# Patient Record
Sex: Female | Born: 1940
Health system: Southern US, Community
[De-identification: ages and names within clinical notes are randomized; demographics above are authoritative.]

## PROBLEM LIST (undated history)

## (undated) DIAGNOSIS — M899 Disorder of bone, unspecified: Secondary | ICD-10-CM

## (undated) DIAGNOSIS — N904 Leukoplakia of vulva: Secondary | ICD-10-CM

## (undated) DIAGNOSIS — R21 Rash and other nonspecific skin eruption: Secondary | ICD-10-CM

## (undated) DIAGNOSIS — H268 Other specified cataract: Secondary | ICD-10-CM

## (undated) DIAGNOSIS — R7309 Other abnormal glucose: Secondary | ICD-10-CM

## (undated) DIAGNOSIS — R5383 Other fatigue: Secondary | ICD-10-CM

## (undated) DIAGNOSIS — I10 Essential (primary) hypertension: Secondary | ICD-10-CM

## (undated) DIAGNOSIS — B0059 Other herpesviral disease of eye: Secondary | ICD-10-CM

## (undated) DIAGNOSIS — J301 Allergic rhinitis due to pollen: Secondary | ICD-10-CM

## (undated) DIAGNOSIS — J45909 Unspecified asthma, uncomplicated: Secondary | ICD-10-CM

## (undated) DIAGNOSIS — B370 Candidal stomatitis: Secondary | ICD-10-CM

## (undated) DIAGNOSIS — M949 Disorder of cartilage, unspecified: Secondary | ICD-10-CM

## (undated) DIAGNOSIS — L989 Disorder of the skin and subcutaneous tissue, unspecified: Secondary | ICD-10-CM

## (undated) DIAGNOSIS — E119 Type 2 diabetes mellitus without complications: Secondary | ICD-10-CM

## (undated) DIAGNOSIS — D696 Thrombocytopenia, unspecified: Secondary | ICD-10-CM

## (undated) DIAGNOSIS — K2100 Gastro-esophageal reflux disease with esophagitis, without bleeding: Secondary | ICD-10-CM

## (undated) DIAGNOSIS — J069 Acute upper respiratory infection, unspecified: Secondary | ICD-10-CM

## (undated) DIAGNOSIS — E785 Hyperlipidemia, unspecified: Secondary | ICD-10-CM

## (undated) DIAGNOSIS — K5732 Diverticulitis of large intestine without perforation or abscess without bleeding: Secondary | ICD-10-CM

## (undated) DIAGNOSIS — R5381 Other malaise: Secondary | ICD-10-CM

## (undated) HISTORY — DX: Other specified cataract: H26.8

## (undated) HISTORY — PX: KNEE ARTHROSCOPY: SUR90

## (undated) HISTORY — DX: Other malaise: R53.83

## (undated) HISTORY — DX: Essential (primary) hypertension: I10

## (undated) HISTORY — DX: Gastro-esophageal reflux disease with esophagitis: K21.0

## (undated) HISTORY — DX: Hyperlipidemia, unspecified: E78.5

## (undated) HISTORY — DX: Type 2 diabetes mellitus without complications: E11.9

## (undated) HISTORY — DX: Diverticulitis of large intestine without perforation or abscess without bleeding: K57.32

## (undated) HISTORY — DX: Gastro-esophageal reflux disease with esophagitis, without bleeding: K21.00

## (undated) HISTORY — DX: Disorder of the skin and subcutaneous tissue, unspecified: L98.9

## (undated) HISTORY — DX: Other malaise: R53.81

## (undated) HISTORY — DX: Rash and other nonspecific skin eruption: R21

## (undated) HISTORY — DX: Disorder of bone, unspecified: M94.9

## (undated) HISTORY — DX: Other abnormal glucose: R73.09

## (undated) HISTORY — DX: Disorder of bone, unspecified: M89.9

## (undated) HISTORY — DX: Unspecified asthma, uncomplicated: J45.909

## (undated) HISTORY — DX: Leukoplakia of vulva: N90.4

## (undated) HISTORY — DX: Allergic rhinitis due to pollen: J30.1

## (undated) HISTORY — PX: ABDOMINAL HYSTERECTOMY: SHX81

## (undated) HISTORY — DX: Acute upper respiratory infection, unspecified: J06.9

## (undated) HISTORY — DX: Thrombocytopenia, unspecified: D69.6

## (undated) HISTORY — DX: Candidal stomatitis: B37.0

## (undated) HISTORY — DX: Other herpesviral disease of eye: B00.59

---

## 1972-12-13 HISTORY — PX: CYST EXCISION: SHX5701

## 2005-12-23 LAB — HM COLONOSCOPY

## 2009-09-09 ENCOUNTER — Encounter: Admission: RE | Admit: 2009-09-09 | Discharge: 2009-09-09 | Payer: Self-pay | Admitting: Internal Medicine

## 2009-09-09 LAB — HM MAMMOGRAPHY: HM Mammogram: NEGATIVE

## 2010-07-07 ENCOUNTER — Encounter: Admission: RE | Admit: 2010-07-07 | Discharge: 2010-07-07 | Payer: Self-pay | Admitting: Gastroenterology

## 2011-12-21 DIAGNOSIS — H3581 Retinal edema: Secondary | ICD-10-CM | POA: Diagnosis not present

## 2011-12-21 DIAGNOSIS — H2010 Chronic iridocyclitis, unspecified eye: Secondary | ICD-10-CM | POA: Diagnosis not present

## 2011-12-21 DIAGNOSIS — H04129 Dry eye syndrome of unspecified lacrimal gland: Secondary | ICD-10-CM | POA: Diagnosis not present

## 2011-12-21 DIAGNOSIS — Z961 Presence of intraocular lens: Secondary | ICD-10-CM | POA: Diagnosis not present

## 2012-02-09 DIAGNOSIS — B0050 Herpesviral ocular disease, unspecified: Secondary | ICD-10-CM | POA: Diagnosis not present

## 2012-02-09 DIAGNOSIS — H04129 Dry eye syndrome of unspecified lacrimal gland: Secondary | ICD-10-CM | POA: Diagnosis not present

## 2012-02-09 DIAGNOSIS — Z961 Presence of intraocular lens: Secondary | ICD-10-CM | POA: Diagnosis not present

## 2012-02-15 DIAGNOSIS — E119 Type 2 diabetes mellitus without complications: Secondary | ICD-10-CM | POA: Diagnosis not present

## 2012-02-15 DIAGNOSIS — E785 Hyperlipidemia, unspecified: Secondary | ICD-10-CM | POA: Diagnosis not present

## 2012-02-15 DIAGNOSIS — D696 Thrombocytopenia, unspecified: Secondary | ICD-10-CM | POA: Diagnosis not present

## 2012-02-15 DIAGNOSIS — L989 Disorder of the skin and subcutaneous tissue, unspecified: Secondary | ICD-10-CM | POA: Diagnosis not present

## 2012-02-17 ENCOUNTER — Other Ambulatory Visit: Payer: Self-pay | Admitting: Internal Medicine

## 2012-02-17 DIAGNOSIS — Z23 Encounter for immunization: Secondary | ICD-10-CM | POA: Diagnosis not present

## 2012-02-17 DIAGNOSIS — Z1231 Encounter for screening mammogram for malignant neoplasm of breast: Secondary | ICD-10-CM

## 2012-02-17 DIAGNOSIS — N904 Leukoplakia of vulva: Secondary | ICD-10-CM | POA: Diagnosis not present

## 2012-02-17 DIAGNOSIS — E119 Type 2 diabetes mellitus without complications: Secondary | ICD-10-CM | POA: Diagnosis not present

## 2012-02-17 DIAGNOSIS — Z780 Asymptomatic menopausal state: Secondary | ICD-10-CM

## 2012-02-17 DIAGNOSIS — I10 Essential (primary) hypertension: Secondary | ICD-10-CM | POA: Diagnosis not present

## 2012-03-03 ENCOUNTER — Ambulatory Visit
Admission: RE | Admit: 2012-03-03 | Discharge: 2012-03-03 | Disposition: A | Discharge status: Home / Self Care | Payer: Medicare Other | Source: Ambulatory Visit | Attending: Internal Medicine | Admitting: Internal Medicine

## 2012-03-03 DIAGNOSIS — Z780 Asymptomatic menopausal state: Secondary | ICD-10-CM

## 2012-03-03 DIAGNOSIS — Z1231 Encounter for screening mammogram for malignant neoplasm of breast: Secondary | ICD-10-CM | POA: Diagnosis not present

## 2012-03-03 DIAGNOSIS — Z13820 Encounter for screening for osteoporosis: Secondary | ICD-10-CM | POA: Diagnosis not present

## 2012-03-03 LAB — HM DEXA SCAN

## 2012-06-12 DIAGNOSIS — H04129 Dry eye syndrome of unspecified lacrimal gland: Secondary | ICD-10-CM | POA: Diagnosis not present

## 2012-06-12 DIAGNOSIS — B0050 Herpesviral ocular disease, unspecified: Secondary | ICD-10-CM | POA: Diagnosis not present

## 2012-06-12 DIAGNOSIS — Z961 Presence of intraocular lens: Secondary | ICD-10-CM | POA: Diagnosis not present

## 2012-06-12 DIAGNOSIS — H3581 Retinal edema: Secondary | ICD-10-CM | POA: Diagnosis not present

## 2012-08-11 DIAGNOSIS — E119 Type 2 diabetes mellitus without complications: Secondary | ICD-10-CM | POA: Diagnosis not present

## 2012-08-11 DIAGNOSIS — E785 Hyperlipidemia, unspecified: Secondary | ICD-10-CM | POA: Diagnosis not present

## 2012-08-16 DIAGNOSIS — E785 Hyperlipidemia, unspecified: Secondary | ICD-10-CM | POA: Diagnosis not present

## 2012-08-16 DIAGNOSIS — E119 Type 2 diabetes mellitus without complications: Secondary | ICD-10-CM | POA: Diagnosis not present

## 2012-08-16 DIAGNOSIS — I10 Essential (primary) hypertension: Secondary | ICD-10-CM | POA: Diagnosis not present

## 2012-09-13 DIAGNOSIS — Z961 Presence of intraocular lens: Secondary | ICD-10-CM | POA: Diagnosis not present

## 2012-09-13 DIAGNOSIS — B0052 Herpesviral keratitis: Secondary | ICD-10-CM | POA: Diagnosis not present

## 2012-09-13 DIAGNOSIS — H01009 Unspecified blepharitis unspecified eye, unspecified eyelid: Secondary | ICD-10-CM | POA: Diagnosis not present

## 2012-10-04 DIAGNOSIS — Z23 Encounter for immunization: Secondary | ICD-10-CM | POA: Diagnosis not present

## 2012-11-08 DIAGNOSIS — I10 Essential (primary) hypertension: Secondary | ICD-10-CM | POA: Diagnosis not present

## 2012-11-08 DIAGNOSIS — E119 Type 2 diabetes mellitus without complications: Secondary | ICD-10-CM | POA: Diagnosis not present

## 2012-11-08 DIAGNOSIS — E785 Hyperlipidemia, unspecified: Secondary | ICD-10-CM | POA: Diagnosis not present

## 2012-11-14 DIAGNOSIS — I10 Essential (primary) hypertension: Secondary | ICD-10-CM | POA: Diagnosis not present

## 2012-11-14 DIAGNOSIS — B0059 Other herpesviral disease of eye: Secondary | ICD-10-CM | POA: Diagnosis not present

## 2012-11-14 DIAGNOSIS — E119 Type 2 diabetes mellitus without complications: Secondary | ICD-10-CM | POA: Diagnosis not present

## 2012-12-20 DIAGNOSIS — H3530 Unspecified macular degeneration: Secondary | ICD-10-CM | POA: Diagnosis not present

## 2012-12-20 DIAGNOSIS — H35369 Drusen (degenerative) of macula, unspecified eye: Secondary | ICD-10-CM | POA: Diagnosis not present

## 2012-12-20 DIAGNOSIS — E119 Type 2 diabetes mellitus without complications: Secondary | ICD-10-CM | POA: Diagnosis not present

## 2012-12-20 DIAGNOSIS — B0052 Herpesviral keratitis: Secondary | ICD-10-CM | POA: Diagnosis not present

## 2012-12-22 DIAGNOSIS — H35359 Cystoid macular degeneration, unspecified eye: Secondary | ICD-10-CM | POA: Diagnosis not present

## 2012-12-22 DIAGNOSIS — H35369 Drusen (degenerative) of macula, unspecified eye: Secondary | ICD-10-CM | POA: Diagnosis not present

## 2012-12-22 DIAGNOSIS — H353190 Nonexudative age-related macular degeneration, unspecified eye, stage unspecified: Secondary | ICD-10-CM | POA: Diagnosis not present

## 2012-12-28 DIAGNOSIS — R141 Gas pain: Secondary | ICD-10-CM | POA: Diagnosis not present

## 2012-12-28 DIAGNOSIS — R143 Flatulence: Secondary | ICD-10-CM | POA: Diagnosis not present

## 2013-01-04 ENCOUNTER — Encounter: Payer: Self-pay | Admitting: Geriatric Medicine

## 2013-01-04 DIAGNOSIS — D571 Sickle-cell disease without crisis: Secondary | ICD-10-CM

## 2013-01-04 DIAGNOSIS — J45909 Unspecified asthma, uncomplicated: Secondary | ICD-10-CM | POA: Insufficient documentation

## 2013-01-04 DIAGNOSIS — R21 Rash and other nonspecific skin eruption: Secondary | ICD-10-CM

## 2013-01-04 DIAGNOSIS — N904 Leukoplakia of vulva: Secondary | ICD-10-CM

## 2013-01-04 DIAGNOSIS — E119 Type 2 diabetes mellitus without complications: Secondary | ICD-10-CM

## 2013-01-04 DIAGNOSIS — M899 Disorder of bone, unspecified: Secondary | ICD-10-CM

## 2013-01-04 DIAGNOSIS — L989 Disorder of the skin and subcutaneous tissue, unspecified: Secondary | ICD-10-CM | POA: Insufficient documentation

## 2013-01-04 DIAGNOSIS — K5732 Diverticulitis of large intestine without perforation or abscess without bleeding: Secondary | ICD-10-CM

## 2013-01-04 DIAGNOSIS — K2100 Gastro-esophageal reflux disease with esophagitis, without bleeding: Secondary | ICD-10-CM

## 2013-01-04 DIAGNOSIS — I10 Essential (primary) hypertension: Secondary | ICD-10-CM

## 2013-01-04 DIAGNOSIS — D696 Thrombocytopenia, unspecified: Secondary | ICD-10-CM

## 2013-01-04 DIAGNOSIS — J069 Acute upper respiratory infection, unspecified: Secondary | ICD-10-CM

## 2013-01-04 DIAGNOSIS — R5383 Other fatigue: Secondary | ICD-10-CM

## 2013-01-04 DIAGNOSIS — H268 Other specified cataract: Secondary | ICD-10-CM

## 2013-01-04 DIAGNOSIS — B0059 Other herpesviral disease of eye: Secondary | ICD-10-CM

## 2013-01-04 DIAGNOSIS — M949 Disorder of cartilage, unspecified: Secondary | ICD-10-CM

## 2013-01-04 DIAGNOSIS — E785 Hyperlipidemia, unspecified: Secondary | ICD-10-CM

## 2013-01-04 DIAGNOSIS — R7309 Other abnormal glucose: Secondary | ICD-10-CM

## 2013-01-04 DIAGNOSIS — B370 Candidal stomatitis: Secondary | ICD-10-CM

## 2013-01-04 DIAGNOSIS — J301 Allergic rhinitis due to pollen: Secondary | ICD-10-CM

## 2013-01-05 ENCOUNTER — Encounter: Payer: Self-pay | Admitting: Geriatric Medicine

## 2013-01-30 DIAGNOSIS — K219 Gastro-esophageal reflux disease without esophagitis: Secondary | ICD-10-CM | POA: Diagnosis not present

## 2013-01-30 DIAGNOSIS — R141 Gas pain: Secondary | ICD-10-CM | POA: Diagnosis not present

## 2013-01-30 DIAGNOSIS — R143 Flatulence: Secondary | ICD-10-CM | POA: Diagnosis not present

## 2013-02-22 DIAGNOSIS — H353190 Nonexudative age-related macular degeneration, unspecified eye, stage unspecified: Secondary | ICD-10-CM | POA: Diagnosis not present

## 2013-02-22 DIAGNOSIS — H43819 Vitreous degeneration, unspecified eye: Secondary | ICD-10-CM | POA: Diagnosis not present

## 2013-02-22 DIAGNOSIS — H35369 Drusen (degenerative) of macula, unspecified eye: Secondary | ICD-10-CM | POA: Diagnosis not present

## 2013-02-22 DIAGNOSIS — H43829 Vitreomacular adhesion, unspecified eye: Secondary | ICD-10-CM | POA: Diagnosis not present

## 2013-03-02 ENCOUNTER — Other Ambulatory Visit: Payer: Medicare Other

## 2013-03-02 DIAGNOSIS — E559 Vitamin D deficiency, unspecified: Secondary | ICD-10-CM | POA: Diagnosis not present

## 2013-03-02 DIAGNOSIS — IMO0001 Reserved for inherently not codable concepts without codable children: Secondary | ICD-10-CM | POA: Diagnosis not present

## 2013-03-02 DIAGNOSIS — D696 Thrombocytopenia, unspecified: Secondary | ICD-10-CM | POA: Diagnosis not present

## 2013-03-02 DIAGNOSIS — I10 Essential (primary) hypertension: Secondary | ICD-10-CM | POA: Diagnosis not present

## 2013-03-02 DIAGNOSIS — E785 Hyperlipidemia, unspecified: Secondary | ICD-10-CM | POA: Diagnosis not present

## 2013-03-02 DIAGNOSIS — R5381 Other malaise: Secondary | ICD-10-CM | POA: Diagnosis not present

## 2013-03-06 ENCOUNTER — Encounter: Payer: Self-pay | Admitting: Internal Medicine

## 2013-03-06 ENCOUNTER — Ambulatory Visit (INDEPENDENT_AMBULATORY_CARE_PROVIDER_SITE_OTHER): Payer: Medicare Other | Admitting: Internal Medicine

## 2013-03-06 VITALS — BP 124/70 | HR 62 | Temp 96.0°F | Resp 16 | Ht 61.0 in | Wt 151.8 lb

## 2013-03-06 DIAGNOSIS — K5730 Diverticulosis of large intestine without perforation or abscess without bleeding: Secondary | ICD-10-CM

## 2013-03-06 DIAGNOSIS — N951 Menopausal and female climacteric states: Secondary | ICD-10-CM | POA: Insufficient documentation

## 2013-03-06 DIAGNOSIS — E785 Hyperlipidemia, unspecified: Secondary | ICD-10-CM

## 2013-03-06 DIAGNOSIS — Z0000 Encounter for general adult medical examination without abnormal findings: Secondary | ICD-10-CM

## 2013-03-06 DIAGNOSIS — I10 Essential (primary) hypertension: Secondary | ICD-10-CM | POA: Diagnosis not present

## 2013-03-06 DIAGNOSIS — L309 Dermatitis, unspecified: Secondary | ICD-10-CM | POA: Insufficient documentation

## 2013-03-06 DIAGNOSIS — Z1231 Encounter for screening mammogram for malignant neoplasm of breast: Secondary | ICD-10-CM

## 2013-03-06 DIAGNOSIS — L259 Unspecified contact dermatitis, unspecified cause: Secondary | ICD-10-CM

## 2013-03-06 DIAGNOSIS — M949 Disorder of cartilage, unspecified: Secondary | ICD-10-CM

## 2013-03-06 DIAGNOSIS — E119 Type 2 diabetes mellitus without complications: Secondary | ICD-10-CM

## 2013-03-06 DIAGNOSIS — M899 Disorder of bone, unspecified: Secondary | ICD-10-CM

## 2013-03-06 DIAGNOSIS — J45909 Unspecified asthma, uncomplicated: Secondary | ICD-10-CM

## 2013-03-06 DIAGNOSIS — J301 Allergic rhinitis due to pollen: Secondary | ICD-10-CM

## 2013-03-06 MED ORDER — TIOTROPIUM BROMIDE MONOHYDRATE 18 MCG IN CAPS: 18.0000 ug | ORAL_CAPSULE | Freq: Every day | RESPIRATORY_TRACT | Status: DC

## 2013-03-06 MED ORDER — ESTROGENS, CONJUGATED 0.625 MG/GM VA CREA: TOPICAL_CREAM | Freq: Every day | VAGINAL | Status: DC

## 2013-03-06 MED ORDER — ALBUTEROL SULFATE HFA 108 (90 BASE) MCG/ACT IN AERS: 2.0000 | INHALATION_SPRAY | Freq: Four times a day (QID) | RESPIRATORY_TRACT | Status: DC | PRN

## 2013-03-06 MED ORDER — ALIGN PO CAPS: ORAL_CAPSULE | ORAL | Status: DC

## 2013-03-06 MED ORDER — LOSARTAN POTASSIUM-HCTZ 100-25 MG PO TABS: ORAL_TABLET | ORAL | Status: DC

## 2013-03-06 MED ORDER — ROSUVASTATIN CALCIUM 20 MG PO TABS: 20.0000 mg | ORAL_TABLET | ORAL | Status: DC

## 2013-03-06 NOTE — Assessment & Plan Note (Signed)
Continue bronchodilator treatment for now, stable

## 2013-03-06 NOTE — Assessment & Plan Note (Signed)
Will schedule mammogram and colonoscopy. Follows with dr Guerry Minors, will schedule appointment with him. Encouraged fiber intake. uptodate with immunization and dexa scan. Reviewed labs with patient. Does not want shingles vaccine. Diet and exercise encouraged

## 2013-03-06 NOTE — Assessment & Plan Note (Signed)
Continue fexofenadine for now and monitor

## 2013-03-06 NOTE — Assessment & Plan Note (Signed)
bp stable in office and at home. Continue carvidelol 25 mg bid and losartan-hctz 100-25 daily. Warning signs with elevated bp explained. Continue aspirin

## 2013-03-06 NOTE — Assessment & Plan Note (Signed)
To apply emollients generously on her extremities, avoid very hot shower for now.

## 2013-03-06 NOTE — Assessment & Plan Note (Signed)
With worsening ldl, will increase her rosuvastatin to 20 mg- pt does not want to increase it further at present. Will have her take this dose at 3 times a week

## 2013-03-06 NOTE — Progress Notes (Signed)
Patient ID: Tanya Bell, female   DOB: May 07, 1941, 72 y.o.   MRN: [id number]   PCP: Oneal Grout, MD  Code Status: full code  Allergies  Allergen Reactions  . Sulfa Antibiotics     Chief Complaint: annual visit, rash in legs, nasal drainage, vaginal dryness  HPI:  72 y/o female patient here for her annual visit.   Dm-Her cbg has been between 80-120, no hypoglycemic episodes  htn- bp has been under control at home with current regimen.  Seasonal aalergies- have acted up and is using allegra on daily basis at present  Rash - she has noticed dryness and itching in her skin for few months. Denies need to scratch, no bleed, no open skin area, limited to her legs  Vaginal dryness- has dryness and itching in her vagina. Denies any discharge from the vagina. Denies any dysuria or hematuria  Annual visit- uptodate with flu vaccine, pneumococcal vaccine. Does not want shingles vaccine. S/p hysterectomy. Does not get pap smear. Follows with gyn. Mammogram in 3/13 was normal. dexa scan 02/2012 showed osteopenia. Colonoscopy 2007 showed diverticulosis but no polyp   Review of Systems  Constitutional: Negative for fever, chills, weight loss and malaise/fatigue.  HENT: Negative for hearing loss and congestion.   Eyes: Negative for blurred vision and pain.       Wears glasses, follows with ophthalmologist  Respiratory: Negative for cough, shortness of breath and wheezing.   Cardiovascular: Negative for chest pain, palpitations, claudication and leg swelling.  Gastrointestinal: Positive for heartburn.       Takes otc zantac when needed. Also takes probiotic  Genitourinary: Negative.   Musculoskeletal: Positive for joint pain.  Skin: Positive for itching and rash.  Neurological: Negative for dizziness, sensory change and headaches.  Endo/Heme/Allergies:       Taking metformin for diabetes. cbg this am was 115. Ranges between 80s- 123  Psychiatric/Behavioral: Negative for depression.  The patient does not have insomnia.     Past Medical History  Diagnosis Date  . Herpes simplex with other ophthalmic complications   . Disorder of bone and cartilage, unspecified   . Reflux esophagitis   . Candidiasis of mouth   . Other cataract   . Other cataract   . Acute upper respiratory infections of unspecified site   . Essential hypertension, benign   . Thrombocytopenia, unspecified   . Other dystrophy of vulva   . Diverticulitis of colon (without mention of hemorrhage)   . Essential hypertension, benign   . Unspecified disorder of skin and subcutaneous tissue   . Rash and other nonspecific skin eruption   . Type II or unspecified type diabetes mellitus without mention of complication, not stated as uncontrolled   . Other and unspecified hyperlipidemia   . Unspecified essential hypertension   . Acute upper respiratory infections of unspecified site   . Type II or unspecified type diabetes mellitus without mention of complication, not stated as uncontrolled   . Other and unspecified hyperlipidemia   . Other and unspecified hyperlipidemia   . Unspecified essential hypertension   . Acute upper respiratory infections of unspecified site   . Allergic rhinitis due to pollen   . Extrinsic asthma, unspecified   . Other malaise and fatigue   . Other abnormal glucose    Past Surgical History  Procedure Laterality Date  . Cesarean section      [phone]  . Cyst excision  1974   Social History:   reports that she quit smoking about 22  years ago. Her smoking use included Cigarettes. She smoked 0.00 packs per day. She does not have any smokeless tobacco history on file. She reports that she does not drink alcohol or use illicit drugs.  Family History  Problem Relation Age of Onset  . Cancer Mother     brain  . Stroke Father     Medications: Patient's Medications  New Prescriptions   BIFIDOBACTERIUM INFANTIS (ALIGN) CAPSULE    Take one tablet once a day    CONJUGATED ESTROGENS (PREMARIN) VAGINAL CREAM    Place vaginally daily.  Previous Medications   ACETAMINOPHEN (TYLENOL) 325 MG TABLET    Take 325 mg by mouth daily as needed. For pain   ASPIRIN 81 MG TABLET    Take 81 mg by mouth daily.   BUDESONIDE-FORMOTEROL (SYMBICORT) 160-4.5 MCG/ACT INHALER    Inhale 2 puffs into the lungs 2 (two) times daily.   CALCIUM-VITAMIN D (OSCAL WITH D) 500-200 MG-UNIT PER TABLET    Take 1 tablet by mouth 3 (three) times daily.   CARVEDILOL (COREG) 25 MG TABLET    Take 25 mg by mouth 2 (two) times daily.   FEXOFENADINE (ALLEGRA) 180 MG TABLET    Take 180 mg by mouth daily.   METFORMIN (GLUCOPHAGE) 500 MG TABLET    500 mg. Take one tablet by mouth once daily.   MOMETASONE (NASONEX) 50 MCG/ACT NASAL SPRAY    One spray in each nostril twice daily as needed for allergies.   MULTIPLE VITAMIN (MULTIVITAMIN) TABLET    Take 1 tablet by mouth daily.   RANITIDINE (ZANTAC) 150 MG TABLET    Take 150 mg by mouth. Take one tablet by mouth daily in the morning.   VITAMIN C (ASCORBIC ACID) 500 MG TABLET    Take 500 mg by mouth daily.  Modified Medications   Modified Medication Previous Medication   ALBUTEROL (PROVENTIL HFA;VENTOLIN HFA) 108 (90 BASE) MCG/ACT INHALER albuterol (PROVENTIL HFA;VENTOLIN HFA) 108 (90 BASE) MCG/ACT inhaler      Inhale 2 puffs into the lungs every 6 (six) hours as needed.    Inhale 2 puffs into the lungs every 6 (six) hours as needed.   LOSARTAN-HYDROCHLOROTHIAZIDE (HYZAAR) 100-25 MG PER TABLET losartan-hydrochlorothiazide (HYZAAR) 100-25 MG per tablet      Take one tablet once a day for blood pressure    Take 1 tablet by mouth daily. For blood pressure.   ROSUVASTATIN (CRESTOR) 20 MG TABLET rosuvastatin (CRESTOR) 10 MG tablet      Take 1 tablet (20 mg total) by mouth 3 (three) times a week. Monday, Wednesday, friday    10 mg. Take one tablet by mouth on Monday, Wednesday, and Friday.   TIOTROPIUM (SPIRIVA) 18 MCG INHALATION CAPSULE tiotropium (SPIRIVA)  18 MCG inhalation capsule      Place 1 capsule (18 mcg total) into inhaler and inhale daily.    Place 18 mcg into inhaler and inhale daily.  Discontinued Medications   No medications on file     Physical Exam:  Filed Vitals:   03/06/13 0842  BP: 124/70  Pulse: 62  Temp: 96 F (35.6 C)  Resp: 16  Height: 5\' 1"  (1.549 m)  Weight: 151 lb 12.8 oz (68.856 kg)   Physical Exam  Constitutional: She is oriented to person, place, and time. She appears well-developed and well-nourished.  HENT:  Head: Normocephalic and atraumatic.  Right Ear: External ear normal.  Left Ear: External ear normal.  Nose: Nose normal.  Mouth/Throat: Oropharynx is clear and  moist.  Eyes: Conjunctivae and EOM are normal. Pupils are equal, round, and reactive to light. Right eye exhibits no discharge. Left eye exhibits no discharge. No scleral icterus.  Neck: Normal range of motion. Neck supple. No tracheal deviation present. No thyromegaly present.  Cardiovascular: Normal rate, regular rhythm, normal heart sounds and intact distal pulses.   No murmur heard. Pulmonary/Chest: Effort normal and breath sounds normal.  Abdominal: Soft. Bowel sounds are normal. She exhibits no distension and no mass. There is no tenderness.  Genitourinary: No vaginal discharge found.  Vaginal mucosa dry, no erythema, no discharge  Musculoskeletal: Normal range of motion. She exhibits no edema and no tenderness.  Lymphadenopathy:    She has no cervical adenopathy.  Neurological: She is alert and oriented to person, place, and time. She has normal reflexes. No cranial nerve deficit.  Skin: Skin is warm and dry. No rash noted. She is not diaphoretic.  Psychiatric: She has a normal mood and affect. Her behavior is normal. Judgment and thought content normal.     Labs reviewed: 03/02/13 Basic Metabolic Panel: bun 18, cr 0.84, na 141, k 4.6, cl 101, glu 116 Liver Function Tests: normal CBC: hb 13.5, hct 39.8 Lipid panel- t.chol  240, hdl 62, ldl 164 a1c 6 Vit d 30.8  dexa scan 3/13- t score -0.6, osteopenia Mammogram 3/13- normal Colonoscopy 2007- no polyps, small internal hemorrrhoids noted, diverticulosis present  EKG: Independently reviewed. Sinus bradycardia, LVH, normal PR interval  Assessment/Plan Allergic rhinitis due to pollen Continue fexofenadine for now and monitor  HTN (hypertension) bp stable in office and at home. Continue carvidelol 25 mg bid and losartan-hctz 100-25 daily. Warning signs with elevated bp explained. Continue aspirin  Extrinsic asthma, unspecified Continue bronchodilator treatment for now, stable  Reflux esophagitis Prn ranitidine has kept her symptoms under control. Monitor for now  Type II or unspecified type diabetes mellitus without mention of complication, not stated as uncontrolled Continue metformin 500 mg daily. a1c 6 today. Reviewed cbg. Normal urine microalbumin. On asa,statin and bp at goal  Disorder of bone and cartilage, unspecified Reviewed dexa scan s/o osteopenia. Continue ca-vit d  Hyperlipidemia LDL goal <100 With worsening ldl, will increase her rosuvastatin to 20 mg- pt does not want to increase it further at present. Will have her take this dose at 3 times a week  Vaginal dryness, menopausal Will have her on premarin cream daily for now  Annual physical exam Will schedule mammogram and colonoscopy. Follows with dr Guerry Minors, will schedule appointment with him. Encouraged fiber intake. uptodate with immunization and dexa scan. Reviewed labs with patient. Does not want shingles vaccine. Diet and exercise encouraged  Eczema To apply emollients generously on her extremities, avoid very hot shower for now.

## 2013-03-06 NOTE — Assessment & Plan Note (Signed)
Continue metformin 500 mg daily. a1c 6 today. Reviewed cbg. Normal urine microalbumin. On asa,statin and bp at goal

## 2013-03-06 NOTE — Assessment & Plan Note (Signed)
Prn ranitidine has kept her symptoms under control. Monitor for now

## 2013-03-06 NOTE — Assessment & Plan Note (Addendum)
Will have her on premarin cream daily for now

## 2013-03-06 NOTE — Patient Instructions (Signed)
Cut down on fatty food, fried food. Walking for 30 minutes everyday at least 5 days a week encouraged. New medications provided to you. Will see you back in 4 months for routine follow up

## 2013-03-06 NOTE — Assessment & Plan Note (Signed)
Reviewed dexa scan s/o osteopenia. Continue ca-vit d

## 2013-03-14 ENCOUNTER — Other Ambulatory Visit: Payer: Self-pay | Admitting: *Deleted

## 2013-03-14 MED ORDER — ZOSTER VACCINE LIVE 19400 UNT/0.65ML ~~LOC~~ SOLR: 0.6500 mL | Freq: Once | SUBCUTANEOUS | Status: DC

## 2013-03-16 DIAGNOSIS — Z1211 Encounter for screening for malignant neoplasm of colon: Secondary | ICD-10-CM | POA: Diagnosis not present

## 2013-03-16 DIAGNOSIS — R141 Gas pain: Secondary | ICD-10-CM | POA: Diagnosis not present

## 2013-03-20 ENCOUNTER — Encounter: Payer: Self-pay | Admitting: Internal Medicine

## 2013-03-26 ENCOUNTER — Other Ambulatory Visit: Payer: Self-pay | Admitting: *Deleted

## 2013-03-26 MED ORDER — CARVEDILOL 25 MG PO TABS: ORAL_TABLET | ORAL | Status: DC

## 2013-03-26 MED ORDER — METFORMIN HCL 500 MG PO TABS: ORAL_TABLET | ORAL | Status: DC

## 2013-03-30 ENCOUNTER — Ambulatory Visit
Admission: RE | Admit: 2013-03-30 | Discharge: 2013-03-30 | Disposition: A | Discharge status: Home / Self Care | Payer: Medicare Other | Source: Ambulatory Visit | Attending: Internal Medicine | Admitting: Internal Medicine

## 2013-03-30 DIAGNOSIS — Z0000 Encounter for general adult medical examination without abnormal findings: Secondary | ICD-10-CM

## 2013-03-30 DIAGNOSIS — Z1231 Encounter for screening mammogram for malignant neoplasm of breast: Secondary | ICD-10-CM | POA: Diagnosis not present

## 2013-04-03 ENCOUNTER — Other Ambulatory Visit: Payer: Self-pay | Admitting: *Deleted

## 2013-04-03 MED ORDER — METFORMIN HCL 500 MG PO TABS: ORAL_TABLET | ORAL | Status: DC

## 2013-04-23 DIAGNOSIS — H3530 Unspecified macular degeneration: Secondary | ICD-10-CM | POA: Diagnosis not present

## 2013-04-23 DIAGNOSIS — Z961 Presence of intraocular lens: Secondary | ICD-10-CM | POA: Diagnosis not present

## 2013-04-23 DIAGNOSIS — H01009 Unspecified blepharitis unspecified eye, unspecified eyelid: Secondary | ICD-10-CM | POA: Diagnosis not present

## 2013-04-23 DIAGNOSIS — H04129 Dry eye syndrome of unspecified lacrimal gland: Secondary | ICD-10-CM | POA: Diagnosis not present

## 2013-06-28 ENCOUNTER — Other Ambulatory Visit: Payer: Self-pay | Admitting: *Deleted

## 2013-06-28 DIAGNOSIS — E785 Hyperlipidemia, unspecified: Secondary | ICD-10-CM

## 2013-06-28 DIAGNOSIS — I10 Essential (primary) hypertension: Secondary | ICD-10-CM

## 2013-07-02 ENCOUNTER — Other Ambulatory Visit: Payer: Medicare Other

## 2013-07-02 ENCOUNTER — Other Ambulatory Visit: Payer: Self-pay | Admitting: Internal Medicine

## 2013-07-02 DIAGNOSIS — E785 Hyperlipidemia, unspecified: Secondary | ICD-10-CM | POA: Diagnosis not present

## 2013-07-02 DIAGNOSIS — I10 Essential (primary) hypertension: Secondary | ICD-10-CM | POA: Diagnosis not present

## 2013-07-02 DIAGNOSIS — R5383 Other fatigue: Secondary | ICD-10-CM | POA: Diagnosis not present

## 2013-07-02 DIAGNOSIS — E119 Type 2 diabetes mellitus without complications: Secondary | ICD-10-CM | POA: Diagnosis not present

## 2013-07-02 DIAGNOSIS — J301 Allergic rhinitis due to pollen: Secondary | ICD-10-CM | POA: Diagnosis not present

## 2013-07-02 DIAGNOSIS — Z0000 Encounter for general adult medical examination without abnormal findings: Secondary | ICD-10-CM | POA: Diagnosis not present

## 2013-07-04 ENCOUNTER — Encounter: Payer: Self-pay | Admitting: Internal Medicine

## 2013-07-04 ENCOUNTER — Ambulatory Visit (INDEPENDENT_AMBULATORY_CARE_PROVIDER_SITE_OTHER): Payer: Medicare Other | Admitting: Internal Medicine

## 2013-07-04 VITALS — BP 128/86 | HR 55 | Temp 98.0°F | Resp 14 | Ht 61.0 in | Wt 157.0 lb

## 2013-07-04 DIAGNOSIS — J45909 Unspecified asthma, uncomplicated: Secondary | ICD-10-CM | POA: Diagnosis not present

## 2013-07-04 DIAGNOSIS — E119 Type 2 diabetes mellitus without complications: Secondary | ICD-10-CM | POA: Diagnosis not present

## 2013-07-04 DIAGNOSIS — J301 Allergic rhinitis due to pollen: Secondary | ICD-10-CM

## 2013-07-04 DIAGNOSIS — I10 Essential (primary) hypertension: Secondary | ICD-10-CM

## 2013-07-04 DIAGNOSIS — M899 Disorder of bone, unspecified: Secondary | ICD-10-CM | POA: Diagnosis not present

## 2013-07-04 DIAGNOSIS — E785 Hyperlipidemia, unspecified: Secondary | ICD-10-CM

## 2013-07-04 DIAGNOSIS — N951 Menopausal and female climacteric states: Secondary | ICD-10-CM

## 2013-07-04 MED ORDER — BUDESONIDE-FORMOTEROL FUMARATE 160-4.5 MCG/ACT IN AERO: 2.0000 | INHALATION_SPRAY | Freq: Two times a day (BID) | RESPIRATORY_TRACT | Status: DC

## 2013-07-04 MED ORDER — METFORMIN HCL 500 MG PO TABS: ORAL_TABLET | ORAL | Status: DC

## 2013-07-04 MED ORDER — CARVEDILOL 25 MG PO TABS: ORAL_TABLET | ORAL | Status: DC

## 2013-07-04 MED ORDER — MOMETASONE FUROATE 50 MCG/ACT NA SUSP: 2.0000 | Freq: Every day | NASAL | Status: DC

## 2013-07-04 NOTE — Progress Notes (Signed)
Patient ID: Tanya Bell, female   DOB: May 04, 1941, 72 y.o.   MRN: [id number]  Chief Complaint  Patient presents with  . Medical Managment of Chronic Issues   Allergies  Allergen Reactions  . Sulfa Antibiotics    HPI  72 y/o female patient here for her routine follow up. Her rash and itching in her legs have resolved. She was started on vaginal cream premarin for her vaginal dryness. She has noticed some improvement but still has occassional dryness and itching.   Dm-Her cbg has been between 70-90, no hypoglycemic episodes. Metformin 500 mg daily. On ARB and statin. a1c this visit 6.1  htn- bp has been under control at home with current regimen. On baby aspirin  Seasonal allergies-  using allegra and nasonex on daily basis at present and symptoms under control  Reflux- under control with ranitidine  Asthma- symptoms under control with her bronchodilator  Review of Systems  Constitutional: Negative for fever, chills, weight loss and malaise/fatigue.  HENT: Negative for hearing loss and congestion.   Eyes: Negative for blurred vision and pain.        Wears glasses, follows with ophthalmologist  Respiratory: Negative for cough, shortness of breath and wheezing.   Cardiovascular: Negative for chest pain, palpitations, claudication and leg swelling.  Gastrointestinal: Positive for heartburn.        Takes otc zantac when needed. Also takes probiotic  Genitourinary: Negative.   Musculoskeletal: Positive for joint pain. Symptoms under control at present Skin: negative for rash and itching Neurological: Negative for dizziness, sensory change and headaches.  Endo/Heme/Allergies:        Taking metformin for diabetes.  Psychiatric/Behavioral: Negative for depression. The patient does not have insomnia.     BP 128/86  Pulse 55  Temp(Src) 98 F (36.7 C) (Oral)  Resp 14  Ht 5\' 1"  (1.549 m)  Wt 157 lb (71.215 kg)  BMI 29.68 kg/m2  Physical Exam  Constitutional: She is oriented  to person, place, and time. She appears well-developed and well-nourished.  Mouth/Throat: Oropharynx is clear and moist.  Neck: Normal range of motion. Neck supple. No tracheal deviation present. No thyromegaly present.  Cardiovascular: Normal rate, regular rhythm, normal heart sounds and intact distal pulses.    No murmur heard. Pulmonary/Chest: Effort normal and breath sounds normal.  Abdominal: Soft. Bowel sounds are normal. She exhibits no distension and no mass. There is no tenderness.  Musculoskeletal: Normal range of motion. She exhibits no edema and no tenderness.  Lymphadenopathy:    She has no cervical adenopathy.  Neurological: She is alert and oriented to person, place, and time. She has normal reflexes. No cranial nerve deficit.  Skin: Skin is warm and dry. No rash noted. She is not diaphoretic.  Psychiatric: She has a normal mood and affect. Her behavior is normal. Judgment and thought content normal.   Labs reviewed: 03/02/13 Basic Metabolic Panel: bun 18, cr 0.84, na 141, k 4.6, cl 101, glu 116 Liver Function Tests: normal CBC: hb 13.5, hct 39.8 Lipid panel- t.chol 240, hdl 62, ldl 164 a1c 6 Vit d 30.8  08/11/55 Wbc 5.7, hb 13.3, hct 39.6, plt 179, glu 108, cr 0.62, bun 18, na 141, k 3.7, cl 102, co2 27, ca 10.1, lft wnl, t.chol 177, tg 65, hdl 65, ldl 99, tsh 0.981, a1c 6.1  dexa scan 3/13- t score -0.6, osteopenia Mammogram 3/13- normal Colonoscopy 2007- no polyps, small internal hemorrrhoids noted, diverticulosis present  EKG: Independently reviewed. Sinus bradycardia, LVH, normal PR  interval  Assessment/Plan  HTN (hypertension) bp stable in office and at home. Continue carvidelol 25 mg bid and losartan-hctz 100-25 daily. Warning signs with elevated bp explained. Continue aspirin. Normal renal function  Extrinsic asthma, unspecified Continue bronchodilator treatment for now, stable  Type II or unspecified type diabetes mellitus without mention of complication,  not stated as uncontrolled Continue metformin 500 mg daily. a1c 6.1. Reviewed cbg. Normal urine microalbumin. On asa,statin and bp at goal  Disorder of bone and cartilage, unspecified Reviewed dexa scan s/o osteopenia. Continue ca-vit d  Allergic rhinitis due to pollen Continue fexofenadine and nasonex for now and monitor  Reflux esophagitis Prn ranitidine has kept her symptoms under control. Monitor for now  Hyperlipidemia LDL goal <100 Continue rosuvastatin 20 mg 3 times a week. Normal lipid panel this visit  Vaginal dryness, menopausal Will continue premarin cream daily for now

## 2013-07-10 LAB — CBC WITH DIFFERENTIAL
Basophils Absolute: 0 10*3/uL (ref 0.0–0.2)
HCT: 39.6 % (ref 34.0–46.6)
Lymphocytes Absolute: 3 10*3/uL (ref 0.7–3.1)
MCH: 25.6 pg — ABNORMAL LOW (ref 26.6–33.0)
MCHC: 33.6 g/dL (ref 31.5–35.7)
MCV: 76 fL — ABNORMAL LOW (ref 79–97)
Monocytes Absolute: 0.4 10*3/uL (ref 0.1–0.9)
Monocytes: 7 % (ref 4–12)
Neutrophils Absolute: 2.1 10*3/uL (ref 1.4–7.0)
Platelets: 179 10*3/uL (ref 150–379)
RDW: 16.2 % — ABNORMAL HIGH (ref 12.3–15.4)
WBC: 5.7 10*3/uL (ref 3.4–10.8)

## 2013-07-10 LAB — TSH: TSH: 0.981 u[IU]/mL (ref 0.450–4.500)

## 2013-07-10 LAB — COMPREHENSIVE METABOLIC PANEL
AST: 15 IU/L (ref 0–40)
Albumin: 4.3 g/dL (ref 3.5–4.8)
Alkaline Phosphatase: 67 IU/L (ref 39–117)
BUN/Creatinine Ratio: 29 — ABNORMAL HIGH (ref 11–26)
BUN: 18 mg/dL (ref 8–27)
Chloride: 102 mmol/L (ref 97–108)
GFR calc Af Amer: 105 mL/min/{1.73_m2} (ref >59)
Globulin, Total: 2.8 g/dL (ref 1.5–4.5)
Sodium: 141 mmol/L (ref 134–144)
Total Bilirubin: 0.4 mg/dL (ref 0.0–1.2)

## 2013-07-10 LAB — LIPID PANEL W/O CHOL/HDL RATIO
Cholesterol, Total: 177 mg/dL (ref 100–199)
LDL Calculated: 99 mg/dL (ref 0–99)

## 2013-07-11 LAB — SPECIMEN STATUS REPORT

## 2013-07-12 LAB — MICROALBUMIN, URINE: Microalbumin, Urine: 10.1 ug/mL (ref 0.0–17.0)

## 2013-08-23 ENCOUNTER — Other Ambulatory Visit: Payer: Self-pay | Admitting: *Deleted

## 2013-08-23 MED ORDER — LOSARTAN POTASSIUM-HCTZ 100-25 MG PO TABS: ORAL_TABLET | ORAL | Status: DC

## 2013-08-29 ENCOUNTER — Other Ambulatory Visit: Payer: Self-pay | Admitting: *Deleted

## 2013-08-29 MED ORDER — BUDESONIDE-FORMOTEROL FUMARATE 160-4.5 MCG/ACT IN AERO: INHALATION_SPRAY | RESPIRATORY_TRACT | Status: DC

## 2013-08-30 DIAGNOSIS — H43829 Vitreomacular adhesion, unspecified eye: Secondary | ICD-10-CM | POA: Diagnosis not present

## 2013-08-30 DIAGNOSIS — H43819 Vitreous degeneration, unspecified eye: Secondary | ICD-10-CM | POA: Diagnosis not present

## 2013-08-30 DIAGNOSIS — E119 Type 2 diabetes mellitus without complications: Secondary | ICD-10-CM | POA: Diagnosis not present

## 2013-08-30 DIAGNOSIS — H353190 Nonexudative age-related macular degeneration, unspecified eye, stage unspecified: Secondary | ICD-10-CM | POA: Diagnosis not present

## 2013-08-30 DIAGNOSIS — B009 Herpesviral infection, unspecified: Secondary | ICD-10-CM | POA: Diagnosis not present

## 2013-09-10 ENCOUNTER — Other Ambulatory Visit: Payer: Self-pay | Admitting: *Deleted

## 2013-09-11 ENCOUNTER — Ambulatory Visit (INDEPENDENT_AMBULATORY_CARE_PROVIDER_SITE_OTHER): Payer: Medicare Other

## 2013-09-11 DIAGNOSIS — Z23 Encounter for immunization: Secondary | ICD-10-CM | POA: Diagnosis not present

## 2013-10-12 ENCOUNTER — Other Ambulatory Visit: Payer: Self-pay

## 2013-10-12 DIAGNOSIS — E785 Hyperlipidemia, unspecified: Secondary | ICD-10-CM

## 2013-10-12 MED ORDER — ROSUVASTATIN CALCIUM 20 MG PO TABS: 20.0000 mg | ORAL_TABLET | ORAL | Status: DC

## 2013-10-12 NOTE — Telephone Encounter (Signed)
Rx sent electronically.  

## 2013-10-24 ENCOUNTER — Ambulatory Visit (INDEPENDENT_AMBULATORY_CARE_PROVIDER_SITE_OTHER): Payer: Medicare Other | Admitting: Internal Medicine

## 2013-10-24 ENCOUNTER — Encounter: Payer: Self-pay | Admitting: Internal Medicine

## 2013-10-24 VITALS — BP 150/82 | HR 58 | Temp 98.2°F | Resp 14 | Wt 155.4 lb

## 2013-10-24 DIAGNOSIS — E785 Hyperlipidemia, unspecified: Secondary | ICD-10-CM

## 2013-10-24 DIAGNOSIS — N951 Menopausal and female climacteric states: Secondary | ICD-10-CM

## 2013-10-24 DIAGNOSIS — I10 Essential (primary) hypertension: Secondary | ICD-10-CM

## 2013-10-24 DIAGNOSIS — E119 Type 2 diabetes mellitus without complications: Secondary | ICD-10-CM

## 2013-10-24 DIAGNOSIS — J45909 Unspecified asthma, uncomplicated: Secondary | ICD-10-CM

## 2013-10-24 MED ORDER — BUDESONIDE-FORMOTEROL FUMARATE 160-4.5 MCG/ACT IN AERO: INHALATION_SPRAY | RESPIRATORY_TRACT | Status: DC

## 2013-10-24 MED ORDER — MOMETASONE FUROATE 50 MCG/ACT NA SUSP: 2.0000 | Freq: Every day | NASAL | Status: DC

## 2013-10-24 MED ORDER — ESTROGENS, CONJUGATED 0.625 MG/GM VA CREA: TOPICAL_CREAM | Freq: Every day | VAGINAL | Status: DC

## 2013-10-24 MED ORDER — LOSARTAN POTASSIUM-HCTZ 100-25 MG PO TABS: ORAL_TABLET | ORAL | Status: DC

## 2013-10-24 MED ORDER — METFORMIN HCL 500 MG PO TABS: ORAL_TABLET | ORAL | Status: DC

## 2013-10-24 MED ORDER — ROSUVASTATIN CALCIUM 20 MG PO TABS: 20.0000 mg | ORAL_TABLET | ORAL | Status: DC

## 2013-10-24 MED ORDER — AMLODIPINE BESYLATE 5 MG PO TABS: 5.0000 mg | ORAL_TABLET | Freq: Every day | ORAL | Status: DC

## 2013-10-24 MED ORDER — TIOTROPIUM BROMIDE MONOHYDRATE 18 MCG IN CAPS: 18.0000 ug | ORAL_CAPSULE | Freq: Every day | RESPIRATORY_TRACT | Status: DC

## 2013-10-24 NOTE — Progress Notes (Signed)
Patient ID: Tanya Bell, female   DOB: 1941/09/11, 72 y.o.   MRN: [id number]  Chief Complaint  Patient presents with  . Medical Managment of Chronic Issues    4 month f/u  . other    medications need to go to mail order    Allergies  Allergen Reactions  . Sulfa Antibiotics     HPI  72 y/o female patient here for her routine follow up. She denies ay complaints.  Dm-Her cbg in am has been between 90-110, no hypoglycemic episodes. Metformin 500 mg daily. On ARB and statin. a1c this visit 6.1  htn- SBP 120-150/ DBP 60-80. Denies any headache, chest pain,SOB, abdominal pain. On coreg 25 mg bid and hyzaar 100-25 daily   Seasonal allergies-  using allegra and nasonex on daily basis at present and symptoms under control  Asthma- symptoms under control with her bronchodilator  Review of Systems   Constitutional: Negative for fever, chills, weight loss and malaise/fatigue.   HENT: Negative for hearing loss and congestion.    Eyes: Negative for blurred vision and pain.        Wears glasses, follows with ophthalmologist  Respiratory: Negative for cough, shortness of breath and wheezing.    Cardiovascular: Negative for chest pain, palpitations, claudication and leg swelling.   Gastrointestinal:      Takes otc zantac when needed. Also takes probiotic   Genitourinary: Negative.    Musculoskeletal: Positive for joint pain. Symptoms under control at present Skin: negative for rash and itching Neurological: Negative for dizziness, sensory change and headaches.   Endo/Heme/Allergies:        Taking metformin for diabetes.   Psychiatric/Behavioral: Negative for depression. The patient does not have insomnia.    Physical exam BP 150/82  Pulse 58  Temp(Src) 98.2 F (36.8 C) (Oral)  Resp 14  Wt 155 lb 6.4 oz (70.489 kg)  SpO2 95%  Constitutional: She is oriented to person, place, and time. She appears well-developed and well-nourished.  Mouth/Throat: Oropharynx is clear and moist.    Neck: Normal range of motion. Neck supple. No tracheal deviation present. No thyromegaly present.   Cardiovascular: Normal rate, regular rhythm, normal heart sounds and intact distal pulses.    No murmur heard. Pulmonary/Chest: Effort normal and breath sounds normal.   Abdominal: Soft. Bowel sounds are normal. She exhibits no distension and no mass. There is no tenderness.  Musculoskeletal: Normal range of motion. She exhibits no edema and no tenderness.  Lymphadenopathy:    She has no cervical adenopathy.  Neurological: She is alert and oriented to person, place, and time. She has normal reflexes. No cranial nerve deficit.   Skin: Skin is warm and dry. No rash noted. She is not diaphoretic.  Psychiatric: She has a normal mood and affect. Her behavior is normal. Judgment and thought content normal.   Labs reviewed: 03/02/13 Basic Metabolic Panel: bun 18, cr 0.84, na 141, k 4.6, cl 101, glu 116 Liver Function Tests: normal CBC: hb 13.5, hct 39.8 Lipid panel- t.chol 240, hdl 62, ldl 164 a1c 6 Vit d 30.8  1/47/82 Wbc 5.7, hb 13.3, hct 39.6, plt 179, glu 108, cr 0.62, bun 18, na 141, k 3.7, cl 102, co2 27, ca 10.1, lft wnl, t.chol 177, tg 65, hdl 65, ldl 99, tsh 0.981, a1c 6.1  dexa scan 3/13- t score -0.6, osteopenia Mammogram 3/13- normal Colonoscopy 2007- no polyps, small internal hemorrrhoids noted, diverticulosis present  EKG: Independently reviewed. Sinus bradycardia, LVH, normal PR interval  Assessment/Plan  1. Vaginal dryness, menopausal - conjugated estrogens (PREMARIN) vaginal cream; Place vaginally daily.  Dispense: 42.5 g; Refill: 12  2. Hyperlipidemia LDL goal <100 - rosuvastatin (CRESTOR) 20 MG tablet; Take 1 tablet (20 mg total) by mouth 3 (three) times a week. Monday, Wednesday, friday  Dispense: 39 tablet; Refill: 1 - Lipid Panel; Future  3. HTN (hypertension) - amLODipine (NORVASC) 5 MG tablet; Take 1 tablet (5 mg total) by mouth daily.  Dispense: 30 tablet;  Refill: 3 New med amlodipine 5 mg daily added, continue her hyzaar and coreg, monitor bp readings, warning signs with elevated bp explained  4. Type II or unspecified type diabetes mellitus without mention of complication, not stated as uncontrolled - Hemoglobin A1c - Microalbumin/Creatinine Ratio, Urine - recommedned to hold metformin for now and to continue holding if a1c < 6.5  5. Extrinsic asthma, unspecified Continue bronchodilator treatment for now, stable

## 2013-10-25 ENCOUNTER — Telehealth: Payer: Self-pay

## 2013-10-25 LAB — HEMOGLOBIN A1C: Est. average glucose Bld gHb Est-mCnc: 134 mg/dL

## 2013-10-25 LAB — MICROALBUMIN / CREATININE URINE RATIO
MICROALB/CREAT RATIO: 6.6 mg/g creat (ref 0.0–30.0)
Microalbumin, Urine: 6.5 ug/mL (ref 0.0–17.0)

## 2013-10-25 NOTE — Telephone Encounter (Signed)
Message copied by Maurice Small on Thu Oct 25, 2013  1:27 PM ------      Message from: Oneal Grout      Created: Thu Oct 25, 2013 11:55 AM       Your blood test shows very well controlled diabetes with your a1c less than 6.5. Thus, lets stop the metformin for now. Will recheck your a1c in  3 months. Monitor your diet. Also your kidney function appears controlled ------

## 2013-10-25 NOTE — Telephone Encounter (Signed)
Removed metformin from medication list per recent lab response, copy of labs mailed to patient

## 2013-12-24 DIAGNOSIS — Z961 Presence of intraocular lens: Secondary | ICD-10-CM | POA: Diagnosis not present

## 2013-12-24 DIAGNOSIS — H04129 Dry eye syndrome of unspecified lacrimal gland: Secondary | ICD-10-CM | POA: Diagnosis not present

## 2013-12-24 DIAGNOSIS — H52219 Irregular astigmatism, unspecified eye: Secondary | ICD-10-CM | POA: Diagnosis not present

## 2013-12-24 DIAGNOSIS — H353190 Nonexudative age-related macular degeneration, unspecified eye, stage unspecified: Secondary | ICD-10-CM | POA: Diagnosis not present

## 2014-02-04 ENCOUNTER — Other Ambulatory Visit: Payer: Medicare Other

## 2014-02-04 DIAGNOSIS — E785 Hyperlipidemia, unspecified: Secondary | ICD-10-CM | POA: Diagnosis not present

## 2014-02-04 DIAGNOSIS — E119 Type 2 diabetes mellitus without complications: Secondary | ICD-10-CM

## 2014-02-04 DIAGNOSIS — I10 Essential (primary) hypertension: Secondary | ICD-10-CM | POA: Diagnosis not present

## 2014-02-04 DIAGNOSIS — L738 Other specified follicular disorders: Secondary | ICD-10-CM | POA: Diagnosis not present

## 2014-02-05 LAB — HEMOGLOBIN A1C
Est. average glucose Bld gHb Est-mCnc: 140 mg/dL
HEMOGLOBIN A1C: 6.5 % — AB (ref 4.8–5.6)

## 2014-02-05 LAB — LIPID PANEL
CHOL/HDL RATIO: 3.6 ratio (ref 0.0–4.4)
Cholesterol, Total: 229 mg/dL — ABNORMAL HIGH (ref 100–199)
HDL: 64 mg/dL (ref >39)
LDL Calculated: 153 mg/dL — ABNORMAL HIGH (ref 0–99)
TRIGLYCERIDES: 60 mg/dL (ref 0–149)
VLDL Cholesterol Cal: 12 mg/dL (ref 5–40)

## 2014-02-06 ENCOUNTER — Encounter: Payer: Self-pay | Admitting: Internal Medicine

## 2014-02-06 ENCOUNTER — Ambulatory Visit: Payer: Medicare Other | Admitting: Internal Medicine

## 2014-02-06 ENCOUNTER — Ambulatory Visit (INDEPENDENT_AMBULATORY_CARE_PROVIDER_SITE_OTHER): Payer: Medicare Other | Admitting: Internal Medicine

## 2014-02-06 VITALS — BP 136/80 | HR 83 | Resp 10 | Wt 162.0 lb

## 2014-02-06 DIAGNOSIS — I10 Essential (primary) hypertension: Secondary | ICD-10-CM

## 2014-02-06 DIAGNOSIS — L678 Other hair color and hair shaft abnormalities: Secondary | ICD-10-CM | POA: Diagnosis not present

## 2014-02-06 DIAGNOSIS — L738 Other specified follicular disorders: Secondary | ICD-10-CM | POA: Diagnosis not present

## 2014-02-06 DIAGNOSIS — N951 Menopausal and female climacteric states: Secondary | ICD-10-CM

## 2014-02-06 DIAGNOSIS — E119 Type 2 diabetes mellitus without complications: Secondary | ICD-10-CM | POA: Diagnosis not present

## 2014-02-06 DIAGNOSIS — L739 Follicular disorder, unspecified: Secondary | ICD-10-CM

## 2014-02-06 DIAGNOSIS — E785 Hyperlipidemia, unspecified: Secondary | ICD-10-CM

## 2014-02-06 MED ORDER — LOSARTAN POTASSIUM-HCTZ 100-25 MG PO TABS: ORAL_TABLET | ORAL | Status: DC

## 2014-02-06 MED ORDER — CARVEDILOL 25 MG PO TABS: ORAL_TABLET | ORAL | Status: DC

## 2014-02-06 MED ORDER — DOXYCYCLINE HYCLATE 100 MG PO TABS: 100.0000 mg | ORAL_TABLET | Freq: Two times a day (BID) | ORAL | Status: DC

## 2014-02-06 MED ORDER — AMLODIPINE BESYLATE 5 MG PO TABS: 5.0000 mg | ORAL_TABLET | Freq: Every day | ORAL | Status: DC

## 2014-02-06 MED ORDER — MOMETASONE FUROATE 50 MCG/ACT NA SUSP: 2.0000 | Freq: Every day | NASAL | Status: DC

## 2014-02-06 MED ORDER — ESTROGENS, CONJUGATED 0.625 MG/GM VA CREA: TOPICAL_CREAM | Freq: Every day | VAGINAL | Status: DC

## 2014-02-06 MED ORDER — ROSUVASTATIN CALCIUM 20 MG PO TABS: 20.0000 mg | ORAL_TABLET | ORAL | Status: DC

## 2014-02-06 MED ORDER — BUDESONIDE-FORMOTEROL FUMARATE 160-4.5 MCG/ACT IN AERO: INHALATION_SPRAY | RESPIRATORY_TRACT | Status: DC

## 2014-02-06 NOTE — Progress Notes (Signed)
Patient ID: Tanya Bell, female   DOB: 05-31-1941, 73 y.o.   MRN: [id number]     Chief Complaint  Patient presents with  . Medical Managment of Chronic Issues    3 month follow-up   Allergies  Allergen Reactions  . Sulfa Antibiotics    HPI  73 y/o female patient here for her routine follow up. She has noticed a bump in her buttock area and has discomfort present. Denies any bleeding or drainage. She denies any other complaints. She is compliant with her medications  Review of Systems   Constitutional: Negative for fever, chills, weight loss and malaise/fatigue.   HENT: Negative for hearing loss and congestion.    Eyes: Negative for blurred vision and pain.  Wears glasses, follows with ophthalmologist  Respiratory: Negative for cough, shortness of breath and wheezing.    Cardiovascular: Negative for chest pain, palpitations, claudication and leg swelling.   Gastrointestinal:      Takes otc zantac when needed. Also takes probiotic   Genitourinary: Negative.    Musculoskeletal: joint aches under control at present Skin: negative for rash and itching Neurological: Negative for dizziness, sensory change and headaches.   Endo/Heme/Allergies:  Taking metformin for diabetes.   Psychiatric/Behavioral: Negative for depression. The patient does not have insomnia.    Physical exam BP 136/80  Pulse 83  Resp 10  Wt 162 lb (73.483 kg)  SpO2 97%  Constitutional: She is oriented to person, place, and time. She appears well-developed and well-nourished.   Neck: Normal range of motion. Neck supple. No tracheal deviation present. No thyromegaly present.   Cardiovascular: Normal rate, regular rhythm, normal heart sounds and intact distal pulses.    No murmur heard. Pulmonary/Chest: Effort normal and breath sounds normal.   Abdominal: Soft. Bowel sounds are normal. She exhibits no distension and no mass. There is no tenderness.  Musculoskeletal: Normal range of motion. She exhibits no  edema and no tenderness.  Lymphadenopathy:    She has no cervical adenopathy.  Neurological: She is alert and oriented to person, place, and time. She has normal reflexes. No cranial nerve deficit.   Skin: Skin is warm and dry. Has folliculitis in right buttock area, mild tenderness on exam Psychiatric: She has a normal mood and affect. Her behavior is normal. Judgment and thought content normal.   Labs reviewed: 03/29/59 Basic Metabolic Panel: bun 18, cr 0.84, na 141, k 4.6, cl 101, glu 116 Liver Function Tests: normal CBC: hb 13.5, hct 39.8 Lipid panel- t.chol 240, hdl 62, ldl 164 a1c 6 Vit d 30.8  07/02/13 Wbc 5.7, hb 13.3, hct 39.6, plt 179, glu 108, cr 0.62, bun 18, na 141, k 3.7, cl 102, co2 27, ca 10.1, lft wnl, t.chol 177, tg 65, hdl 65, ldl 99, tsh 0.981, a1c 6.1  Lab Results  Component Value Date   HGBA1C 6.5* 02/04/2014   Lipid Panel     Component Value Date/Time   TRIG 60 02/04/2014 1003   HDL 64 02/04/2014 1003   CHOLHDL 3.6 02/04/2014 1003   LDLCALC 153* 02/04/2014 1003    dexa scan 3/13- t score -0.6, osteopenia Mammogram 3/13- normal Colonoscopy 2007- no polyps, small internal hemorrrhoids noted, diverticulosis present  EKG: Independently reviewed. Sinus bradycardia, LVH, normal PR interval  Assessment/Plan  1. HTN (hypertension) Well controlled on current regimen. Continue amlodipine, coreg and hyzaar - amLODipine (NORVASC) 5 MG tablet; Take 1 tablet (5 mg total) by mouth daily.  Dispense: 90 tablet; Refill: 1  2. Hyperlipidemia LDL  goal <100 Reviewed lipid panel - rosuvastatin (CRESTOR) 20 MG tablet; Take 1 tablet (20 mg total) by mouth 3 (three) times a week. Monday, Wednesday, friday  Dispense: 39 tablet; Refill: 1  3. Folliculitis Will have her on doxycycline 100 mg bid for 5 days Reassess if no improvement  4. Type II or unspecified type diabetes mellitus without mention of complication, not stated as uncontrolled Diet controlled Goal a1c <7  5.  Vaginal dryness, menopausal - conjugated estrogens (PREMARIN) vaginal cream; Place vaginally daily.  Dispense: 42.5 g; Refill: 12   5. Extrinsic asthma, unspecified Continue bronchodilator treatment for now, stable

## 2014-02-20 LAB — SPECIMEN STATUS REPORT

## 2014-02-28 LAB — BASIC METABOLIC PANEL
BUN/Creatinine Ratio: 24 (ref 11–26)
BUN: 19 mg/dL (ref 8–27)
CO2: 23 mmol/L (ref 18–29)
Calcium: 9.7 mg/dL (ref 8.7–10.3)
Chloride: 100 mmol/L (ref 97–108)
Creatinine, Ser: 0.8 mg/dL (ref 0.57–1.00)
GFR calc non Af Amer: 74 mL/min/{1.73_m2} (ref >59)
GFR, EST AFRICAN AMERICAN: 86 mL/min/{1.73_m2} (ref >59)
Glucose: 130 mg/dL — ABNORMAL HIGH (ref 65–99)
Potassium: 4.5 mmol/L (ref 3.5–5.2)
Sodium: 142 mmol/L (ref 134–144)

## 2014-02-28 LAB — SPECIMEN STATUS REPORT

## 2014-03-25 ENCOUNTER — Other Ambulatory Visit: Payer: Self-pay | Admitting: *Deleted

## 2014-03-25 MED ORDER — MOMETASONE FUROATE 50 MCG/ACT NA SUSP: 2.0000 | Freq: Every day | NASAL | Status: DC

## 2014-05-07 ENCOUNTER — Other Ambulatory Visit: Payer: Self-pay | Admitting: *Deleted

## 2014-05-07 ENCOUNTER — Encounter: Payer: Self-pay | Admitting: Internal Medicine

## 2014-05-07 ENCOUNTER — Ambulatory Visit (INDEPENDENT_AMBULATORY_CARE_PROVIDER_SITE_OTHER): Payer: Medicare Other | Admitting: Internal Medicine

## 2014-05-07 VITALS — BP 128/76 | HR 60 | Temp 98.5°F | Resp 10 | Ht 60.0 in | Wt 164.0 lb

## 2014-05-07 DIAGNOSIS — E119 Type 2 diabetes mellitus without complications: Secondary | ICD-10-CM

## 2014-05-07 DIAGNOSIS — J45909 Unspecified asthma, uncomplicated: Secondary | ICD-10-CM | POA: Diagnosis not present

## 2014-05-07 DIAGNOSIS — K2100 Gastro-esophageal reflux disease with esophagitis, without bleeding: Secondary | ICD-10-CM | POA: Diagnosis not present

## 2014-05-07 DIAGNOSIS — E785 Hyperlipidemia, unspecified: Secondary | ICD-10-CM

## 2014-05-07 DIAGNOSIS — I10 Essential (primary) hypertension: Secondary | ICD-10-CM

## 2014-05-07 MED ORDER — MOMETASONE FUROATE 50 MCG/ACT NA SUSP: 2.0000 | Freq: Every day | NASAL | Status: DC

## 2014-05-07 MED ORDER — CARVEDILOL 25 MG PO TABS: ORAL_TABLET | ORAL | Status: DC

## 2014-05-07 MED ORDER — AMLODIPINE BESYLATE 5 MG PO TABS: 5.0000 mg | ORAL_TABLET | Freq: Every day | ORAL | Status: DC

## 2014-05-07 MED ORDER — TIOTROPIUM BROMIDE MONOHYDRATE 18 MCG IN CAPS: 18.0000 ug | ORAL_CAPSULE | Freq: Every day | RESPIRATORY_TRACT | Status: DC

## 2014-05-07 NOTE — Progress Notes (Signed)
Patient ID: Tanya Bell, female   DOB: [birth year], 73 y.o.   MRN: [id number]    Chief Complaint  Patient presents with  . Medical Management of Chronic Issues    3 month follow-up, fasting for any necessary labs due    Allergies  Allergen Reactions  . Sulfa Antibiotics    HPI 73 y/o female patient is here for routine follow up. She is compliant with her medications. She is concerned about her blood sugar reading being elevated, she is checking it three times a day and had 1 reading of 156 and another of 141, otherwise on review of glucometere, cbg av 121 in 30 days and 125 in 1 week average. She is not taking the metformin. She also stopped crestor by herself  Review of Systems   Constitutional: Negative for fever, chills, weight loss and malaise/fatigue.   HENT: Negative for hearing loss and congestion.    Eyes: Negative for blurred vision and pain.  Wears glasses, follows with ophthalmologist  Respiratory: Negative for cough, shortness of breath and wheezing. has seasonal allergies and asthma under control   Cardiovascular: Negative for chest pain, palpitations, claudication and leg swelling.   Gastrointestinal:      Takes otc zantac when needed. Also takes probiotic   Genitourinary: Negative.    Musculoskeletal: joint aches under control at present Skin: negative for rash and itching Neurological: Negative for dizziness, sensory change and headaches.   Endo/Heme/Allergies: stable Psychiatric/Behavioral: Negative for depression. The patient does not have insomnia.    Past Medical History  Diagnosis Date  . Herpes simplex with other ophthalmic complications   . Disorder of bone and cartilage, unspecified   . Reflux esophagitis   . Candidiasis of mouth   . Other cataract   . Other cataract   . Acute upper respiratory infections of unspecified site   . Essential hypertension, benign   . Thrombocytopenia, unspecified   . Other dystrophy of vulva   . Diverticulitis of  colon (without mention of hemorrhage)   . Essential hypertension, benign   . Unspecified disorder of skin and subcutaneous tissue   . Rash and other nonspecific skin eruption   . Type II or unspecified type diabetes mellitus without mention of complication, not stated as uncontrolled   . Other and unspecified hyperlipidemia   . Unspecified essential hypertension   . Acute upper respiratory infections of unspecified site   . Type II or unspecified type diabetes mellitus without mention of complication, not stated as uncontrolled   . Other and unspecified hyperlipidemia   . Other and unspecified hyperlipidemia   . Unspecified essential hypertension   . Acute upper respiratory infections of unspecified site   . Allergic rhinitis due to pollen   . Extrinsic asthma, unspecified   . Other malaise and fatigue   . Other abnormal glucose    Past Surgical History  Procedure Laterality Date  . Cesarean section      [phone]  . Cyst excision  1974   Current Outpatient Prescriptions on File Prior to Visit  Medication Sig Dispense Refill  . acetaminophen (TYLENOL) 325 MG tablet Take 325 mg by mouth daily as needed. For pain      . albuterol (PROVENTIL HFA;VENTOLIN HFA) 108 (90 BASE) MCG/ACT inhaler Inhale 2 puffs into the lungs every 6 (six) hours as needed.  3 Inhaler  3  . amLODipine (NORVASC) 5 MG tablet Take 1 tablet (5 mg total) by mouth daily.  90 tablet  1  . aspirin 81  MG tablet Take 81 mg by mouth daily.      . bifidobacterium infantis (ALIGN) capsule Take one tablet once a day  30 capsule  3  . budesonide-formoterol (SYMBICORT) 160-4.5 MCG/ACT inhaler Inhale two puffs into the lungs twice daily    Member No. [id number]  3 Inhaler  1  . calcium-vitamin D (OSCAL WITH D) 500-200 MG-UNIT per tablet Take 1 tablet by mouth 3 (three) times daily.      . carvedilol (COREG) 25 MG tablet Take one tablet two times a day for blood pressure  180 tablet  1  . conjugated estrogens  (PREMARIN) vaginal cream Place vaginally daily.  42.5 g  12  . fexofenadine (ALLEGRA) 180 MG tablet Take 180 mg by mouth daily.      Marland Kitchen losartan-hydrochlorothiazide (HYZAAR) 100-25 MG per tablet Take one tablet once a day for blood pressure  90 tablet  1  . mometasone (NASONEX) 50 MCG/ACT nasal spray Place 2 sprays into the nose daily. One spray in each nostril twice daily as needed for allergies.  54 g  3  . Multiple Vitamin (MULTIVITAMIN) tablet Take 1 tablet by mouth daily.      . ranitidine (ZANTAC) 150 MG tablet Take 150 mg by mouth. Take one tablet by mouth daily in the morning.      . rosuvastatin (CRESTOR) 20 MG tablet Take 1 tablet (20 mg total) by mouth 3 (three) times a week. Monday, Wednesday, friday  39 tablet  1  . tiotropium (SPIRIVA) 18 MCG inhalation capsule Place 1 capsule (18 mcg total) into inhaler and inhale daily.  30 capsule  3  . vitamin C (ASCORBIC ACID) 500 MG tablet Take 500 mg by mouth daily.      Marland Kitchen zoster vaccine live, PF, (ZOSTAVAX) 20254 UNT/0.65ML injection Inject 19,400 Units into the skin once.  1 each  0   No current facility-administered medications on file prior to visit.    Physical exam BP 128/76  Pulse 60  Temp(Src) 98.5 F (36.9 C) (Oral)  Resp 10  Ht 5' (1.524 m)  Wt 164 lb (74.39 kg)  BMI 32.03 kg/m2  SpO2 96%  Constitutional: She is oriented to person, place, and time. She appears well-developed and well-nourished.   Neck: Normal range of motion. Neck supple. No tracheal deviation present. No thyromegaly present.   Cardiovascular: Normal rate, regular rhythm, normal heart sounds and intact distal pulses.    No murmur heard. Pulmonary/Chest: Effort normal and breath sounds normal.   Abdominal: Soft. Bowel sounds are normal. She exhibits no distension and no mass. There is no tenderness.  Musculoskeletal: Normal range of motion. She exhibits no edema and no tenderness.  Lymphadenopathy:    She has no cervical adenopathy.  Neurological: She is  alert and oriented to person, place, and time. She has normal reflexes. No cranial nerve deficit.  normal sensation to pinprick and vibration.  Skin: Skin is warm and dry.  Psychiatric: She has a normal mood and affect. Her behavior is normal. Judgment and thought content normal.   Labs reviewed: 07/02/13 Wbc 5.7, hb 13.3, hct 39.6, plt 179, glu 108, cr 0.62, bun 18, na 141, k 3.7, cl 102, co2 27, ca 10.1, lft wnl, t.chol 177, tg 65, hdl 65, ldl 99, tsh 0.981, a1c 6.1  Lipid Panel     Component Value Date/Time   TRIG 60 02/04/2014 1003   HDL 64 02/04/2014 1003   CHOLHDL 3.6 02/04/2014 1003   LDLCALC 153* 02/04/2014 1003  Lab Results  Component Value Date   HGBA1C 6.5* 02/04/2014   CMP     Component Value Date/Time   NA 142 02/04/2014 1003   K 4.5 02/04/2014 1003   CL 100 02/04/2014 1003   CO2 23 02/04/2014 1003   GLUCOSE 130* 02/04/2014 1003   BUN 19 02/04/2014 1003   CREATININE 0.80 02/04/2014 1003   CALCIUM 9.7 02/04/2014 1003   PROT 7.1 07/02/2013 0843   AST 15 07/02/2013 0843   ALT 18 07/02/2013 0843   ALKPHOS 67 07/02/2013 0843   BILITOT 0.4 07/02/2013 0843   GFRNONAA 74 02/04/2014 1003   GFRAA 86 02/04/2014 1003   dexa scan 3/13- t score -0.6, osteopenia Mammogram 3/13- normal Colonoscopy 2007- no polyps, small internal hemorrrhoids noted, diverticulosis present   Assessment/plan  1. Type II or unspecified type diabetes mellitus without mention of complication, not stated as uncontrolled Diet controlled for now. Not on metformin at present. Monitor a1c and start rx if a1c > 7. Check urine microalbumin next visit. Normal foot exam. Encouraged exercise, foot exam and routine yearly eye exam - Hemoglobin A1c - CMP - TSH; Future - Hemoglobin A1c; Future - CMP; Future - Microalbumin/Creatinine Ratio, Urine; Future  2. Reflux esophagitis Stable on prn zantac, monitor clinically  3. Extrinsic asthma, unspecified Stable, continue her breathing treatment and allergy medications,  no changes made this visit  4. Hyperlipidemia LDL goal <100 Pt has self stopped crestor. Recheck lipid panel and consider statin if ldl > 130 - Lipid Panel - CBC with Differential; Future - Lipid Panel; Future - TSH; Future  5. Unspecified essential hypertension Well controlled on current regimen. Continue amlodipine, coreg and hyzaar  F/u in 4 month for annual exam

## 2014-05-08 ENCOUNTER — Other Ambulatory Visit: Payer: Self-pay | Admitting: *Deleted

## 2014-05-08 LAB — COMPREHENSIVE METABOLIC PANEL
ALBUMIN: 4.3 g/dL (ref 3.5–4.8)
ALK PHOS: 70 IU/L (ref 39–117)
ALT: 16 IU/L (ref 0–32)
AST: 16 IU/L (ref 0–40)
Albumin/Globulin Ratio: 1.4 (ref 1.1–2.5)
BUN / CREAT RATIO: 19 (ref 11–26)
BUN: 16 mg/dL (ref 8–27)
CHLORIDE: 99 mmol/L (ref 97–108)
CO2: 24 mmol/L (ref 18–29)
Calcium: 9.3 mg/dL (ref 8.7–10.3)
Creatinine, Ser: 0.83 mg/dL (ref 0.57–1.00)
GFR calc Af Amer: 81 mL/min/{1.73_m2} (ref >59)
GFR calc non Af Amer: 71 mL/min/{1.73_m2} (ref >59)
Globulin, Total: 3 g/dL (ref 1.5–4.5)
Glucose: 135 mg/dL — ABNORMAL HIGH (ref 65–99)
POTASSIUM: 3.8 mmol/L (ref 3.5–5.2)
Sodium: 139 mmol/L (ref 134–144)
Total Bilirubin: 0.5 mg/dL (ref 0.0–1.2)
Total Protein: 7.3 g/dL (ref 6.0–8.5)

## 2014-05-08 LAB — LIPID PANEL
CHOLESTEROL TOTAL: 227 mg/dL — AB (ref 100–199)
Chol/HDL Ratio: 3.8 ratio units (ref 0.0–4.4)
HDL: 59 mg/dL (ref >39)
LDL CALC: 156 mg/dL — AB (ref 0–99)
TRIGLYCERIDES: 60 mg/dL (ref 0–149)
VLDL Cholesterol Cal: 12 mg/dL (ref 5–40)

## 2014-05-08 LAB — HEMOGLOBIN A1C
Est. average glucose Bld gHb Est-mCnc: 146 mg/dL
HEMOGLOBIN A1C: 6.7 % — AB (ref 4.8–5.6)

## 2014-05-08 MED ORDER — MOMETASONE FUROATE 50 MCG/ACT NA SUSP: NASAL | Status: DC

## 2014-05-08 NOTE — Telephone Encounter (Signed)
[facility]

## 2014-05-10 ENCOUNTER — Other Ambulatory Visit: Payer: Self-pay | Admitting: *Deleted

## 2014-05-10 MED ORDER — MOMETASONE FUROATE 50 MCG/ACT NA SUSP: NASAL | Status: DC

## 2014-05-10 NOTE — Telephone Encounter (Signed)
[facility]

## 2014-06-11 DIAGNOSIS — Z1211 Encounter for screening for malignant neoplasm of colon: Secondary | ICD-10-CM | POA: Diagnosis not present

## 2014-09-02 ENCOUNTER — Other Ambulatory Visit: Payer: Medicare Other

## 2014-09-02 DIAGNOSIS — E785 Hyperlipidemia, unspecified: Secondary | ICD-10-CM | POA: Diagnosis not present

## 2014-09-02 DIAGNOSIS — E119 Type 2 diabetes mellitus without complications: Secondary | ICD-10-CM

## 2014-09-03 LAB — MICROALBUMIN / CREATININE URINE RATIO
Creatinine, Ur: 67.4 mg/dL (ref 15.0–278.0)
MICROALB/CREAT RATIO: 5 mg/g{creat} (ref 0.0–30.0)
MICROALBUM., U, RANDOM: 3.4 ug/mL (ref 0.0–17.0)

## 2014-09-03 LAB — LIPID PANEL
CHOL/HDL RATIO: 4 ratio (ref 0.0–4.4)
Cholesterol, Total: 230 mg/dL — ABNORMAL HIGH (ref 100–199)
HDL: 57 mg/dL (ref >39)
LDL Calculated: 159 mg/dL — ABNORMAL HIGH (ref 0–99)
Triglycerides: 68 mg/dL (ref 0–149)
VLDL Cholesterol Cal: 14 mg/dL (ref 5–40)

## 2014-09-03 LAB — HEMOGLOBIN A1C
ESTIMATED AVERAGE GLUCOSE: 146 mg/dL
HEMOGLOBIN A1C: 6.7 % — AB (ref 4.8–5.6)

## 2014-09-03 LAB — COMPREHENSIVE METABOLIC PANEL
ALT: 16 IU/L (ref 0–32)
AST: 17 IU/L (ref 0–40)
Albumin/Globulin Ratio: 1.5 (ref 1.1–2.5)
Albumin: 4.3 g/dL (ref 3.5–4.8)
Alkaline Phosphatase: 69 IU/L (ref 39–117)
BUN/Creatinine Ratio: 20 (ref 11–26)
BUN: 16 mg/dL (ref 8–27)
CALCIUM: 9.6 mg/dL (ref 8.7–10.3)
CO2: 28 mmol/L (ref 18–29)
Chloride: 101 mmol/L (ref 97–108)
Creatinine, Ser: 0.82 mg/dL (ref 0.57–1.00)
GFR calc Af Amer: 83 mL/min/{1.73_m2} (ref >59)
GFR calc non Af Amer: 72 mL/min/{1.73_m2} (ref >59)
Globulin, Total: 2.9 g/dL (ref 1.5–4.5)
Glucose: 145 mg/dL — ABNORMAL HIGH (ref 65–99)
POTASSIUM: 4.6 mmol/L (ref 3.5–5.2)
SODIUM: 143 mmol/L (ref 134–144)
Total Bilirubin: 0.6 mg/dL (ref 0.0–1.2)
Total Protein: 7.2 g/dL (ref 6.0–8.5)

## 2014-09-03 LAB — CBC WITH DIFFERENTIAL/PLATELET
BASOS ABS: 0 10*3/uL (ref 0.0–0.2)
Basos: 1 %
EOS ABS: 0.2 10*3/uL (ref 0.0–0.4)
Eos: 3 %
HEMATOCRIT: 40.3 % (ref 34.0–46.6)
Hemoglobin: 13.5 g/dL (ref 11.1–15.9)
Immature Grans (Abs): 0 10*3/uL (ref 0.0–0.1)
Immature Granulocytes: 0 %
LYMPHS ABS: 2.7 10*3/uL (ref 0.7–3.1)
LYMPHS: 48 %
MCH: 25.7 pg — AB (ref 26.6–33.0)
MCHC: 33.5 g/dL (ref 31.5–35.7)
MCV: 77 fL — ABNORMAL LOW (ref 79–97)
Monocytes Absolute: 0.5 10*3/uL (ref 0.1–0.9)
Monocytes: 8 %
NEUTROS ABS: 2.3 10*3/uL (ref 1.4–7.0)
Neutrophils Relative %: 40 %
RBC: 5.25 x10E6/uL (ref 3.77–5.28)
RDW: 16.5 % — ABNORMAL HIGH (ref 12.3–15.4)
WBC: 5.6 10*3/uL (ref 3.4–10.8)

## 2014-09-03 LAB — TSH: TSH: 1.03 u[IU]/mL (ref 0.450–4.500)

## 2014-09-04 ENCOUNTER — Encounter: Payer: Self-pay | Admitting: Internal Medicine

## 2014-09-04 ENCOUNTER — Ambulatory Visit (INDEPENDENT_AMBULATORY_CARE_PROVIDER_SITE_OTHER): Payer: Medicare Other | Admitting: Internal Medicine

## 2014-09-04 VITALS — BP 130/82 | HR 68 | Temp 97.9°F | Resp 10 | Ht 60.0 in | Wt 165.2 lb

## 2014-09-04 DIAGNOSIS — M8580 Other specified disorders of bone density and structure, unspecified site: Secondary | ICD-10-CM

## 2014-09-04 DIAGNOSIS — M949 Disorder of cartilage, unspecified: Secondary | ICD-10-CM

## 2014-09-04 DIAGNOSIS — E119 Type 2 diabetes mellitus without complications: Secondary | ICD-10-CM

## 2014-09-04 DIAGNOSIS — M899 Disorder of bone, unspecified: Secondary | ICD-10-CM | POA: Diagnosis not present

## 2014-09-04 DIAGNOSIS — Z23 Encounter for immunization: Secondary | ICD-10-CM

## 2014-09-04 DIAGNOSIS — N951 Menopausal and female climacteric states: Secondary | ICD-10-CM

## 2014-09-04 DIAGNOSIS — E785 Hyperlipidemia, unspecified: Secondary | ICD-10-CM

## 2014-09-04 DIAGNOSIS — Z0000 Encounter for general adult medical examination without abnormal findings: Secondary | ICD-10-CM

## 2014-09-04 DIAGNOSIS — I10 Essential (primary) hypertension: Secondary | ICD-10-CM | POA: Diagnosis not present

## 2014-09-04 DIAGNOSIS — Z1211 Encounter for screening for malignant neoplasm of colon: Secondary | ICD-10-CM

## 2014-09-04 MED ORDER — ROSUVASTATIN CALCIUM 20 MG PO TABS: 20.0000 mg | ORAL_TABLET | ORAL | Status: DC

## 2014-09-04 MED ORDER — ALBUTEROL SULFATE HFA 108 (90 BASE) MCG/ACT IN AERS: 2.0000 | INHALATION_SPRAY | Freq: Four times a day (QID) | RESPIRATORY_TRACT | Status: DC | PRN

## 2014-09-04 MED ORDER — METFORMIN HCL 500 MG PO TABS: 500.0000 mg | ORAL_TABLET | Freq: Every day | ORAL | Status: DC

## 2014-09-04 MED ORDER — BUDESONIDE-FORMOTEROL FUMARATE 160-4.5 MCG/ACT IN AERO: INHALATION_SPRAY | RESPIRATORY_TRACT | Status: DC

## 2014-09-04 MED ORDER — CARVEDILOL 25 MG PO TABS: ORAL_TABLET | ORAL | Status: DC

## 2014-09-04 MED ORDER — LOSARTAN POTASSIUM-HCTZ 100-25 MG PO TABS: ORAL_TABLET | ORAL | Status: DC

## 2014-09-04 MED ORDER — AMLODIPINE BESYLATE 5 MG PO TABS: 5.0000 mg | ORAL_TABLET | Freq: Every day | ORAL | Status: DC

## 2014-09-04 NOTE — Progress Notes (Signed)
Patient ID: Tanya Bell, female   DOB: 1941-03-09, 73 y.o.   MRN: [id number]       PCP: Blanchie Serve, MD  Code Status: no advance directives  Allergies  Allergen Reactions  . Sulfa Antibiotics     Chief Complaint  Patient presents with  . Annual Exam    Yearly check-up, no pap, discuss labs(copy printed). MMSE 25/30 (passed clock drawing)   . Allergies    Patient with allergies and questions if she should get flu vaccine     HPI:  73 y/o female patient is here for her annual exam. No advance directive Does not want mammogram this year Last dexa scan 2 years back 03/03/12 reviewed S/p hysterectomy Last colonoscopy 2007- no polyps, internal hemorrhoid and diverticulosis Compliant with her medications Needs scripts for her meds printed out   Review of Systems:  Constitutional: Negative for fever, chills, malaise/fatigue and diaphoresis.  HENT: Negative for congestion. Has allergies   Eyes: Negative for eye pain, blurred vision, double vision and discharge.  Respiratory: Negative for cough, sputum production, shortness of breath and wheezing.   Cardiovascular: Negative for chest pain, palpitations, orthopnea and leg swelling.  Gastrointestinal: Negative for heartburn, nausea, vomiting, abdominal pain, diarrhea and constipation.  Genitourinary: Negative for dysuria, urgency, frequency, hematuria and flank pain.  Musculoskeletal: Negative for back pain, falls, joint pain and myalgias.  Skin: Negative for itching and rash.  Neurological: Negative for weakness,dizziness, tingling, focal weakness and headaches.  Psychiatric/Behavioral: Negative for depression and memory loss. The patient is not nervous/anxious.     Past Medical History  Diagnosis Date  . Herpes simplex with other ophthalmic complications   . Disorder of bone and cartilage, unspecified   . Reflux esophagitis   . Candidiasis of mouth   . Other cataract   . Other cataract   . Acute upper respiratory  infections of unspecified site   . Essential hypertension, benign   . Thrombocytopenia, unspecified   . Other dystrophy of vulva   . Diverticulitis of colon (without mention of hemorrhage)   . Essential hypertension, benign   . Unspecified disorder of skin and subcutaneous tissue   . Rash and other nonspecific skin eruption   . Type II or unspecified type diabetes mellitus without mention of complication, not stated as uncontrolled   . Other and unspecified hyperlipidemia   . Unspecified essential hypertension   . Acute upper respiratory infections of unspecified site   . Type II or unspecified type diabetes mellitus without mention of complication, not stated as uncontrolled   . Other and unspecified hyperlipidemia   . Other and unspecified hyperlipidemia   . Unspecified essential hypertension   . Acute upper respiratory infections of unspecified site   . Allergic rhinitis due to pollen   . Extrinsic asthma, unspecified   . Other malaise and fatigue   . Other abnormal glucose    Past Surgical History  Procedure Laterality Date  . Cesarean section      [phone]  . Cyst excision  1974   Social History:   reports that she quit smoking about 23 years ago. Her smoking use included Cigarettes. She smoked 0.00 packs per day. She does not have any smokeless tobacco history on file. She reports that she does not drink alcohol or use illicit drugs.  Family History  Problem Relation Age of Onset  . Cancer Mother     brain  . Stroke Father     Medications: Patient's Medications  New Prescriptions  No medications on file  Previous Medications   ACETAMINOPHEN (TYLENOL) 325 MG TABLET    Take 325 mg by mouth daily as needed. For pain   ALBUTEROL (PROVENTIL HFA;VENTOLIN HFA) 108 (90 BASE) MCG/ACT INHALER    Inhale 2 puffs into the lungs every 6 (six) hours as needed.   AMLODIPINE (NORVASC) 5 MG TABLET    Take 1 tablet (5 mg total) by mouth daily.   ASPIRIN 81 MG TABLET     Take 81 mg by mouth daily.   BIFIDOBACTERIUM INFANTIS (ALIGN) CAPSULE    Take one tablet once a day   BUDESONIDE-FORMOTEROL (SYMBICORT) 160-4.5 MCG/ACT INHALER    Inhale two puffs into the lungs twice daily    Member No. [id number]   CALCIUM-VITAMIN D (OSCAL WITH D) 500-200 MG-UNIT PER TABLET    Take 1 tablet by mouth 3 (three) times daily.   CARVEDILOL (COREG) 25 MG TABLET    Take one tablet two times a day for blood pressure   CONJUGATED ESTROGENS (PREMARIN) VAGINAL CREAM    Place vaginally daily.   FEXOFENADINE (ALLEGRA) 180 MG TABLET    Take 180 mg by mouth daily.   LOSARTAN-HYDROCHLOROTHIAZIDE (HYZAAR) 100-25 MG PER TABLET    Take one tablet once a day for blood pressure   MOMETASONE (NASONEX) 50 MCG/ACT NASAL SPRAY    One spray in each nostril twice daily as needed for allergies.   MULTIPLE VITAMIN (MULTIVITAMIN) TABLET    Take 1 tablet by mouth daily.   RANITIDINE (ZANTAC) 150 MG TABLET    Take 150 mg by mouth. Take one tablet by mouth daily in the morning.   ROSUVASTATIN (CRESTOR) 20 MG TABLET    Take 1 tablet (20 mg total) by mouth 3 (three) times a week. Monday, Wednesday, friday   TIOTROPIUM (SPIRIVA) 18 MCG INHALATION CAPSULE    Place 1 capsule (18 mcg total) into inhaler and inhale daily.   VITAMIN C (ASCORBIC ACID) 500 MG TABLET    Take 500 mg by mouth daily.   ZOSTER VACCINE LIVE, PF, (ZOSTAVAX) 67893 UNT/0.65ML INJECTION    Inject 19,400 Units into the skin once.  Modified Medications   No medications on file  Discontinued Medications   No medications on file     Physical Exam: Filed Vitals:   09/04/14 0841  BP: 130/82  Pulse: 68  Temp: 97.9 F (36.6 C)  TempSrc: Oral  Resp: 10  Height: 5' (1.524 m)  Weight: 165 lb 3.2 oz (74.934 kg)  SpO2: 97%    General- elderly female in no acute distress Head- atraumatic, normocephalic Eyes- PERRLA, EOMI, no pallor, no icterus, no discharge Neck- no lymphadenopathy, no thyromegaly, no jugular vein distension, no carotid  bruit Ears- left ear normal tympanic membrane and normal external ear canal , right ear normal tympanic membrane and normal external ear canal Throat- moist mucus membrane, normal oropharynx Nose- normal nasal mucosa, no maxillary or frontal sinus tenderness Chest- no chest wall deformities, no chest wall tenderness Breast- no masses, no palpable lumps, normal nipple and areola exam, no axillary lymphadenopathy Cardiovascular- normal s1,s2, no murmurs/ rubs/ gallops Respiratory- bilateral clear to auscultation, no wheeze, no rhonchi, no crackles, no use of accessory muscles Abdomen- bowel sounds present, soft, non tender, no organomegaly, no abdominal bruits, no guarding or rigidity, no CVA tenderness Pelvic exam- refused Musculoskeletal- able to move all 4 extremities, no spinal and paraspinal tenderness, steady gait, no use of assistive device, normal range of motion, no leg edema Neurological- no focal  deficit, normal reflexes, normal muscle strength, normal sensation to fine touch and vibration Skin- warm and dry Psychiatry- alert and oriented to person, place and time, normal mood and affect    Labs reviewed: Basic Metabolic Panel:  Recent Labs  02/04/14 1003 05/07/14 0946 09/02/14 1005  NA 142 139 143  K 4.5 3.8 4.6  CL 100 99 101  CO2 23 24 28   GLUCOSE 130* 135* 145*  BUN 19 16 16   CREATININE 0.80 0.83 0.82  CALCIUM 9.7 9.3 9.6   Liver Function Tests:  Recent Labs  05/07/14 0946 09/02/14 1005  AST 16 17  ALT 16 16  ALKPHOS 70 69  BILITOT 0.5 0.6  PROT 7.3 7.2   CBC:  Recent Labs  09/02/14 1005  WBC 5.6  NEUTROABS 2.3  HGB 13.5  HCT 40.3  MCV 77*   Lipid Panel     Component Value Date/Time   TRIG 68 09/02/2014 1005   HDL 57 09/02/2014 1005   CHOLHDL 4.0 09/02/2014 1005   LDLCALC 159* 09/02/2014 1005   Lab Results  Component Value Date   TSH 1.030 09/02/2014   Lab Results  Component Value Date   HGBA1C 6.7* 09/02/2014   09/02/14 urine  microalbumin normal 09/04/14 MMSE 28/30, passed clock draw 03/06/13 ekg- sinus bradycardia, normal PR interval, LVH 03/03/12 dexa scan- osteopenia with t score of -1.8  Assessment/Plan  1. Hyperlipidemia LDL goal <100 Reviewed lipid panel. Continue crestor - rosuvastatin (CRESTOR) 20 MG tablet; Take 1 tablet (20 mg total) by mouth 3 (three) times a week. Monday, Wednesday, friday  Dispense: 39 tablet; Refill: 1  2. Essential hypertension Stable, continue amlodipine, hyzaar and coreg. Continue baby aspirin - amLODipine (NORVASC) 5 MG tablet; Take 1 tablet (5 mg total) by mouth daily.  Dispense: 90 tablet; Refill: 3  3. Osteopenia Repeat dexa scan , continue oscal and weight bearing exercise - DG Bone Density; Future  4. Colon cancer screening - Fecal occult blood, imunochemical; Future  5. Need for prophylactic vaccination and inoculation against influenza Flu vaccine provided  6. V70.0 the patient was counseled regarding the appropriate use of alcohol, regular self-examination of the breasts on a monthly basis, prevention of dental and periodontal disease, diet, regular sustained exercise for at least 30 minutes 5 times per week, routine screening interval for mammogram as recommended by the [place] and ACOG, the proper use of sunscreen and protective clothing, tobacco use, and recommended schedule for GI hemoccult testing, colonoscopy, cholesterol, thyroid and diabetes screening. Flu vaccine given, fobt card provided, dexa to be scheduled. Negative fall and depression screening. Advance directives and its reading material provided  7. Atrophic vaginitis Continue estrace cream  8. Dm Lab Results  Component Value Date   HGBA1C 6.7* 09/02/2014   Continue metformin 500 mg daily    Blanchie Serve, MD  [facility] [phone] (Monday-Friday 8 am - 5 pm) [phone] (afterhours)

## 2014-09-04 NOTE — Progress Notes (Signed)
Passed clock drawing 

## 2014-09-10 DIAGNOSIS — E119 Type 2 diabetes mellitus without complications: Secondary | ICD-10-CM | POA: Diagnosis not present

## 2014-09-10 DIAGNOSIS — H35369 Drusen (degenerative) of macula, unspecified eye: Secondary | ICD-10-CM | POA: Diagnosis not present

## 2014-09-10 DIAGNOSIS — H43819 Vitreous degeneration, unspecified eye: Secondary | ICD-10-CM | POA: Diagnosis not present

## 2014-09-10 DIAGNOSIS — H353190 Nonexudative age-related macular degeneration, unspecified eye, stage unspecified: Secondary | ICD-10-CM | POA: Diagnosis not present

## 2014-09-13 ENCOUNTER — Ambulatory Visit
Admission: RE | Admit: 2014-09-13 | Discharge: 2014-09-13 | Disposition: A | Discharge status: Home / Self Care | Payer: Medicare Other | Source: Ambulatory Visit | Attending: Internal Medicine | Admitting: Internal Medicine

## 2014-09-13 DIAGNOSIS — M8580 Other specified disorders of bone density and structure, unspecified site: Secondary | ICD-10-CM | POA: Diagnosis not present

## 2014-09-13 DIAGNOSIS — Z780 Asymptomatic menopausal state: Secondary | ICD-10-CM | POA: Diagnosis not present

## 2014-09-25 ENCOUNTER — Encounter: Payer: Self-pay | Admitting: Internal Medicine

## 2014-09-25 ENCOUNTER — Encounter: Payer: Medicare Other | Admitting: Internal Medicine

## 2014-09-25 MED ORDER — CALCIUM CARBONATE-VITAMIN D 500-200 MG-UNIT PO TABS: 1.0000 | ORAL_TABLET | Freq: Two times a day (BID) | ORAL | Status: DC

## 2014-09-25 MED ORDER — ALBUTEROL SULFATE HFA 108 (90 BASE) MCG/ACT IN AERS: 2.0000 | INHALATION_SPRAY | Freq: Four times a day (QID) | RESPIRATORY_TRACT | Status: DC | PRN

## 2014-09-25 NOTE — Progress Notes (Signed)
Patient ID: Tanya Bell, female   DOB: 12-23-1940, 73 y.o.   MRN: [id number]  This encounter was created in error - please disregard.

## 2014-10-07 ENCOUNTER — Other Ambulatory Visit: Payer: Self-pay | Admitting: Internal Medicine

## 2014-12-04 ENCOUNTER — Encounter: Payer: Self-pay | Admitting: Internal Medicine

## 2014-12-04 ENCOUNTER — Ambulatory Visit (INDEPENDENT_AMBULATORY_CARE_PROVIDER_SITE_OTHER): Payer: Medicare Other | Admitting: Internal Medicine

## 2014-12-04 VITALS — BP 138/80 | HR 68 | Temp 98.0°F | Resp 10 | Ht 60.0 in | Wt 166.4 lb

## 2014-12-04 DIAGNOSIS — H6692 Otitis media, unspecified, left ear: Secondary | ICD-10-CM | POA: Diagnosis not present

## 2014-12-04 DIAGNOSIS — J301 Allergic rhinitis due to pollen: Secondary | ICD-10-CM | POA: Diagnosis not present

## 2014-12-04 DIAGNOSIS — J45909 Unspecified asthma, uncomplicated: Secondary | ICD-10-CM

## 2014-12-04 MED ORDER — AMOXICILLIN-POT CLAVULANATE 875-125 MG PO TABS: 1.0000 | ORAL_TABLET | Freq: Two times a day (BID) | ORAL | Status: DC

## 2014-12-04 NOTE — Progress Notes (Signed)
Patient ID: Tanya Bell, female   DOB: [birth year], 73 y.o.   MRN: [id number]    Facility  [facility]    Place of Service:   OFFICE   Allergies  Allergen Reactions  . Sulfa Antibiotics     Chief Complaint  Patient presents with  . Acute Visit    Possible sinus infection: sinus drainage causing sore throat, cough(productive/yellow) and weak feeling x 1 week    HPI:  73 yo female with hx allergic rhinitis and past smoker presents today for an acute visit. 1 week hx productive cough with yellow sputum, sore throat, fatigue, intermittent HA, arthralgias, nausea, post nasal drip, sleep disturbance, ear pain. No dizziness, CP, SOB, change in appetite, sinus pressure. Tried OTC robitussin and Tylenol, home remedy with no relief. She feels hoarse. She has not taken any antihistamine or used her nasal steroid as rx.  Medications: Patient's Medications  New Prescriptions   No medications on file  Previous Medications   ACETAMINOPHEN (TYLENOL) 325 MG TABLET    Take 325 mg by mouth daily as needed. For pain   ALBUTEROL (PROVENTIL HFA;VENTOLIN HFA) 108 (90 BASE) MCG/ACT INHALER    Inhale 2 puffs into the lungs every 6 (six) hours as needed for wheezing or shortness of breath.   AMLODIPINE (NORVASC) 5 MG TABLET    Take 1 tablet (5 mg total) by mouth daily.   ASPIRIN 81 MG TABLET    Take 81 mg by mouth daily.   BIFIDOBACTERIUM INFANTIS (ALIGN) CAPSULE    Take one tablet once a day   BUDESONIDE-FORMOTEROL (SYMBICORT) 160-4.5 MCG/ACT INHALER    Inhale two puffs into the lungs twice daily    Member No. [id number]   CALCIUM-VITAMIN D (OSCAL WITH D) 500-200 MG-UNIT PER TABLET    Take 1 tablet by mouth 2 (two) times daily.   CARVEDILOL (COREG) 25 MG TABLET    Take one tablet two times a day for blood pressure   CONJUGATED ESTROGENS (PREMARIN) VAGINAL CREAM    Place vaginally daily.   FEXOFENADINE (ALLEGRA) 180 MG TABLET    Take 180 mg by mouth daily.   LOSARTAN-HYDROCHLOROTHIAZIDE (HYZAAR)  100-25 MG PER TABLET    TAKE 1 TABLET ONCE A DAY FOR BLOOD PRESSURE   METFORMIN (GLUCOPHAGE) 500 MG TABLET    Take 1 tablet (500 mg total) by mouth daily with breakfast.   MOMETASONE (NASONEX) 50 MCG/ACT NASAL SPRAY    One spray in each nostril twice daily as needed for allergies.   MULTIPLE VITAMIN (MULTIVITAMIN) TABLET    Take 1 tablet by mouth daily.   RANITIDINE (ZANTAC) 150 MG TABLET    Take 150 mg by mouth. Take one tablet by mouth daily in the morning.   ROSUVASTATIN (CRESTOR) 20 MG TABLET    Take 1 tablet (20 mg total) by mouth 3 (three) times a week. Monday, Wednesday, friday   TIOTROPIUM (SPIRIVA) 18 MCG INHALATION CAPSULE    Place 1 capsule (18 mcg total) into inhaler and inhale daily.   VITAMIN C (ASCORBIC ACID) 500 MG TABLET    Take 500 mg by mouth daily.   ZOSTER VACCINE LIVE, PF, (ZOSTAVAX) 88416 UNT/0.65ML INJECTION    Inject 19,400 Units into the skin once.  Modified Medications   No medications on file  Discontinued Medications   No medications on file     Review of Systems  Filed Vitals:   12/04/14 1108  BP: 138/80  Pulse: 68  Temp: 98 F (36.7 C)  TempSrc:  Oral  Resp: 10  Height: 5' (1.524 m)  Weight: 166 lb 6.4 oz (75.479 kg)  SpO2: 97%   Body mass index is 32.5 kg/(m^2).  Physical Exam  CONSTITUTIONAL: Looks ill in NAD. Awake, alert and oriented x 3 HEENT: left TM red and retracted but intact. Right TM dull but no redness. PERRLA. No sinus TTP. Nares with enlarged grey dry turbinates. Oropharynx cobblestoning and red but no exudate NECK: Supple. L>R TTP enlarged cervical lymph nodes. CVS: Regular rate without murmur, gallop or rub. LUNGS: (+) end expiratory wheezing but no rales or rhonchi. EXTREMITIES: left ankle edema. Distal pulses palpable. No calf tenderness    Assessment/Plan  Acute left otitis media, recurrence not specified, unspecified otitis media type - Plan: amoxicillin-clavulanate (AUGMENTIN) 875-125 MG per tablet  Allergic rhinitis  due to pollen  Extrinsic asthma, unspecified asthma severity, uncomplicated   -Resume nasal steroid as directed.  -Saline nasal spray as needed to keep nose moist.  -Resume allegra daily.  -Try Coricidan HBP OTC for cough     Shykeem Resurreccion S. Perlie Gold  [facility] [address] [place], [place] [address] [phone] Office (Wednesdays and Fridays 8 AM - 5 PM) [phone] Cell (Monday-Friday 8 AM - 5 PM)

## 2014-12-04 NOTE — Patient Instructions (Addendum)
Resume nasal steroid as directed.  Saline nasal spray as needed to keep nose moist.  Resume allegra daily.  Try Coricidan HBP OTC for cough

## 2015-01-01 ENCOUNTER — Ambulatory Visit: Payer: Medicare Other | Admitting: Internal Medicine

## 2015-01-15 DIAGNOSIS — H3531 Nonexudative age-related macular degeneration: Secondary | ICD-10-CM | POA: Diagnosis not present

## 2015-01-15 DIAGNOSIS — Z961 Presence of intraocular lens: Secondary | ICD-10-CM | POA: Diagnosis not present

## 2015-01-15 DIAGNOSIS — E119 Type 2 diabetes mellitus without complications: Secondary | ICD-10-CM | POA: Diagnosis not present

## 2015-01-15 DIAGNOSIS — H04123 Dry eye syndrome of bilateral lacrimal glands: Secondary | ICD-10-CM | POA: Diagnosis not present

## 2015-02-04 ENCOUNTER — Ambulatory Visit (INDEPENDENT_AMBULATORY_CARE_PROVIDER_SITE_OTHER): Payer: Medicare Other | Admitting: Internal Medicine

## 2015-02-04 VITALS — BP 130/82 | HR 62 | Temp 98.2°F | Resp 18 | Ht 60.0 in | Wt 167.0 lb

## 2015-02-04 DIAGNOSIS — I83893 Varicose veins of bilateral lower extremities with other complications: Secondary | ICD-10-CM

## 2015-02-04 DIAGNOSIS — E119 Type 2 diabetes mellitus without complications: Secondary | ICD-10-CM | POA: Insufficient documentation

## 2015-02-04 DIAGNOSIS — I10 Essential (primary) hypertension: Secondary | ICD-10-CM | POA: Diagnosis not present

## 2015-02-04 DIAGNOSIS — E785 Hyperlipidemia, unspecified: Secondary | ICD-10-CM | POA: Diagnosis not present

## 2015-02-04 DIAGNOSIS — I83899 Varicose veins of unspecified lower extremities with other complications: Secondary | ICD-10-CM | POA: Insufficient documentation

## 2015-02-04 MED ORDER — AMBULATORY NON FORMULARY MEDICATION: Status: DC

## 2015-02-04 NOTE — Progress Notes (Signed)
Patient ID: Virgel Manifold, female   DOB: 1941-10-09, 74 y.o.   MRN: [id number]     Chief Complaint  Patient presents with  . Medical Management of Chronic Issues    legs a little heavy, swelling(calf)seen in lt during the day    Allergies  Allergen Reactions  . Sulfa Antibiotics     HPI 74 y/o female pt is here for follow up visit Her legs feel heavy at times She has noticed swelling in her legs left > right which increases over the day and finds it to have subsided next day in the morning. She has noticed it more on the days she has mostly been on her feet. She complaints of discomfort with the swelling. She also feels her legs to be heavy at times but denies any numbness or tingling or focal weakness Denies muscle tightness/spasm.  Denies any fall or trauma Denies itching, redness or warmth in the joint or skin area cbg 125 this am cbg average 120s Lowest 99 and highest 143 Has been taking her metformin bp readings have been stable Has been taking her crestor for hyperlipidemia  Review of Systems:  Constitutional: Negative for fever, chills, diaphoresis.  HENT: Negative for congestion. Has allergies   Eyes: Negative for eye pain, blurred vision, double vision and discharge.  Respiratory: Negative for cough, sputum production, shortness of breath and wheezing.   Cardiovascular: Negative for chest pain, palpitations, orthopnea.  Gastrointestinal: Negative for heartburn, nausea, vomiting, abdominal pain Genitourinary: Negative for dysuria Skin: Negative for itching and rash.  Neurological: Negative for weakness,dizziness, tingling, focal weakness and headaches.  Psychiatric/Behavioral: Negative for depression  Past Medical History  Diagnosis Date  . Herpes simplex with other ophthalmic complications   . Disorder of bone and cartilage, unspecified   . Reflux esophagitis   . Candidiasis of mouth   . Other cataract   . Other cataract   . Acute upper respiratory  infections of unspecified site   . Essential hypertension, benign   . Thrombocytopenia, unspecified   . Other dystrophy of vulva   . Diverticulitis of colon (without mention of hemorrhage)   . Essential hypertension, benign   . Unspecified disorder of skin and subcutaneous tissue   . Rash and other nonspecific skin eruption   . Type II or unspecified type diabetes mellitus without mention of complication, not stated as uncontrolled   . Other and unspecified hyperlipidemia   . Unspecified essential hypertension   . Acute upper respiratory infections of unspecified site   . Type II or unspecified type diabetes mellitus without mention of complication, not stated as uncontrolled   . Other and unspecified hyperlipidemia   . Other and unspecified hyperlipidemia   . Unspecified essential hypertension   . Acute upper respiratory infections of unspecified site   . Allergic rhinitis due to pollen   . Extrinsic asthma, unspecified   . Other malaise and fatigue   . Other abnormal glucose    Medication reviewed. See [facility]  Physical exam BP 130/82 mmHg  Pulse 62  Temp(Src) 98.2 F (36.8 C) (Oral)  Resp 18  Ht 5' (1.524 m)  Wt 167 lb (75.751 kg)  BMI 32.62 kg/m2  SpO2 95%  General- elderly female in no acute distress Head- atraumatic, normocephalic Eyes- no pallor, no icterus, no discharge Neck- no lymphadenopathy, no jugular vein distension Throat- moist mucus membrane Cardiovascular- normal s1,s2, no murmurs Respiratory- bilateral clear to auscultation, no wheeze, no rhonchi, no crackles, no use of accessory muscles  Abdomen- bowel sounds present, soft, non tender Musculoskeletal- able to move all 4 extremities, 1+ symmetrical leg edema in both legs, varicose veins noted, non tender, good distal pulses, no skin breakdown or skin changes, no ulcers. homan's sign negative  Neurological- no focal deficit Skin- warm and dry Psychiatry- alert and oriented to person, place and time, normal  mood and affect   Labs Lab Results  Component Value Date   HGBA1C 6.7* 09/02/2014   Lipid Panel     Component Value Date/Time   TRIG 68 09/02/2014 1005   HDL 57 09/02/2014 1005   CHOLHDL 4.0 09/02/2014 1005   LDLCALC 159* 09/02/2014 1005   CMP     Component Value Date/Time   NA 143 09/02/2014 1005   K 4.6 09/02/2014 1005   CL 101 09/02/2014 1005   CO2 28 09/02/2014 1005   GLUCOSE 145* 09/02/2014 1005   BUN 16 09/02/2014 1005   CREATININE 0.82 09/02/2014 1005   CALCIUM 9.6 09/02/2014 1005   PROT 7.2 09/02/2014 1005   AST 17 09/02/2014 1005   ALT 16 09/02/2014 1005   ALKPHOS 69 09/02/2014 1005   BILITOT 0.6 09/02/2014 1005   GFRNONAA 72 09/02/2014 1005   GFRAA 83 09/02/2014 1005   CBC Latest Ref Rng 09/02/2014 07/02/2013  WBC 3.4 - 10.8 x10E3/uL 5.6 5.7  Hemoglobin 11.1 - 15.9 g/dL 13.5 13.3  Hematocrit 34.0 - 46.6 % 40.3 39.6  Platelets 150 - 379 x10E3/uL - 179   Lab Results  Component Value Date   TSH 1.030 09/02/2014   Assessment/plan  1. Varicose veins of leg with edema, bilateral Has varicose veins/dilated veins on exam. No skin pigmentation or skin breakdown noted at present. good circulation on exam. No signs of DVT or cellulitis on exam.Patient advised to wear compression stocking for now when out of bed and to remove it at bedtime. Leg elevation at rest. If no improvement to get venous doppler to assess for severity of venous reflux.  2. Essential hypertension Stable, continue coreg, hyzaar and amlodipine. Her amlodipine could be contributing some to leg edema but her varicose veins and venous stasis are more likely the main contributing factor here. If no improvement with above intervention, consider switching from amlodipine to another bp medication  3. Hyperlipidemia LDL goal <100 continue crestor 20 mg daily. Reviewed lipid and liver function from 9/15.  4. Diabetes mellitus type 2, controlled Continue metformin 500 mg daily, check a1c today, continue  cbg check, continue asa and crestor with losartan - Hemoglobin A1c

## 2015-02-05 LAB — HEMOGLOBIN A1C
Est. average glucose Bld gHb Est-mCnc: 143 mg/dL
HEMOGLOBIN A1C: 6.6 % — AB (ref 4.8–5.6)

## 2015-03-04 ENCOUNTER — Telehealth: Payer: Self-pay | Admitting: *Deleted

## 2015-03-04 NOTE — Telephone Encounter (Signed)
Patient called and left message regarding Dr. Bubba Camp stopped the Amlodipine due to side effects and started her on another BP medication 1 1/2 tablet but she didn't know if it was to be taken am and pm or just one. Tried calling patient back to confirm medication and LMOM to return call. I spoke with Dr. Bubba Camp and she reviewed last OV Note and stated that patient needed to continue Amlodipine once daily, Coreg twice daily and Hyzaar once daily and wear her compression stockings.  Awaiting return call from patient.

## 2015-03-04 NOTE — Telephone Encounter (Signed)
Patient notified and agreed and will keep follow up appointment in [place]

## 2015-05-02 ENCOUNTER — Other Ambulatory Visit: Payer: Self-pay

## 2015-05-02 DIAGNOSIS — E785 Hyperlipidemia, unspecified: Secondary | ICD-10-CM

## 2015-05-02 DIAGNOSIS — E119 Type 2 diabetes mellitus without complications: Secondary | ICD-10-CM

## 2015-05-02 DIAGNOSIS — D571 Sickle-cell disease without crisis: Secondary | ICD-10-CM

## 2015-05-02 DIAGNOSIS — I10 Essential (primary) hypertension: Secondary | ICD-10-CM

## 2015-05-05 ENCOUNTER — Other Ambulatory Visit: Payer: Medicare Other

## 2015-05-05 DIAGNOSIS — E785 Hyperlipidemia, unspecified: Secondary | ICD-10-CM

## 2015-05-05 DIAGNOSIS — E119 Type 2 diabetes mellitus without complications: Secondary | ICD-10-CM | POA: Diagnosis not present

## 2015-05-05 DIAGNOSIS — D571 Sickle-cell disease without crisis: Secondary | ICD-10-CM | POA: Diagnosis not present

## 2015-05-05 DIAGNOSIS — I10 Essential (primary) hypertension: Secondary | ICD-10-CM | POA: Diagnosis not present

## 2015-05-06 LAB — COMPREHENSIVE METABOLIC PANEL
ALBUMIN: 4.1 g/dL (ref 3.5–4.8)
ALT: 15 IU/L (ref 0–32)
AST: 16 IU/L (ref 0–40)
Albumin/Globulin Ratio: 1.5 (ref 1.1–2.5)
Alkaline Phosphatase: 61 IU/L (ref 39–117)
BILIRUBIN TOTAL: 0.5 mg/dL (ref 0.0–1.2)
BUN / CREAT RATIO: 25 (ref 11–26)
BUN: 17 mg/dL (ref 8–27)
CHLORIDE: 99 mmol/L (ref 97–108)
CO2: 22 mmol/L (ref 18–29)
Calcium: 9.3 mg/dL (ref 8.7–10.3)
Creatinine, Ser: 0.68 mg/dL (ref 0.57–1.00)
GFR calc non Af Amer: 87 mL/min/{1.73_m2} (ref >59)
GFR, EST AFRICAN AMERICAN: 100 mL/min/{1.73_m2} (ref >59)
GLUCOSE: 142 mg/dL — AB (ref 65–99)
Globulin, Total: 2.8 g/dL (ref 1.5–4.5)
POTASSIUM: 3.9 mmol/L (ref 3.5–5.2)
SODIUM: 139 mmol/L (ref 134–144)
TOTAL PROTEIN: 6.9 g/dL (ref 6.0–8.5)

## 2015-05-06 LAB — CBC WITH DIFFERENTIAL/PLATELET
Basophils Absolute: 0 10*3/uL (ref 0.0–0.2)
Basos: 1 %
EOS (ABSOLUTE): 0.2 10*3/uL (ref 0.0–0.4)
EOS: 4 %
Hematocrit: 38 % (ref 34.0–46.6)
Hemoglobin: 13.1 g/dL (ref 11.1–15.9)
Immature Grans (Abs): 0 10*3/uL (ref 0.0–0.1)
Immature Granulocytes: 0 %
LYMPHS ABS: 2.8 10*3/uL (ref 0.7–3.1)
Lymphs: 47 %
MCH: 26 pg — ABNORMAL LOW (ref 26.6–33.0)
MCHC: 34.5 g/dL (ref 31.5–35.7)
MCV: 75 fL — ABNORMAL LOW (ref 79–97)
MONOS ABS: 0.5 10*3/uL (ref 0.1–0.9)
Monocytes: 8 %
Neutrophils Absolute: 2.3 10*3/uL (ref 1.4–7.0)
Neutrophils: 40 %
Platelets: 166 10*3/uL (ref 150–379)
RBC: 5.04 x10E6/uL (ref 3.77–5.28)
RDW: 16.3 % — ABNORMAL HIGH (ref 12.3–15.4)
WBC: 5.8 10*3/uL (ref 3.4–10.8)

## 2015-05-06 LAB — LIPID PANEL
CHOL/HDL RATIO: 3.7 ratio (ref 0.0–4.4)
CHOLESTEROL TOTAL: 224 mg/dL — AB (ref 100–199)
HDL: 60 mg/dL (ref >39)
LDL CALC: 152 mg/dL — AB (ref 0–99)
Triglycerides: 59 mg/dL (ref 0–149)
VLDL Cholesterol Cal: 12 mg/dL (ref 5–40)

## 2015-05-06 LAB — HEMOGLOBIN A1C
Est. average glucose Bld gHb Est-mCnc: 148 mg/dL
Hgb A1c MFr Bld: 6.8 % — ABNORMAL HIGH (ref 4.8–5.6)

## 2015-05-07 ENCOUNTER — Encounter: Payer: Self-pay | Admitting: Internal Medicine

## 2015-05-07 ENCOUNTER — Ambulatory Visit (INDEPENDENT_AMBULATORY_CARE_PROVIDER_SITE_OTHER): Payer: Medicare Other | Admitting: Internal Medicine

## 2015-05-07 VITALS — BP 136/80 | HR 82 | Temp 98.4°F | Resp 18 | Ht 60.0 in | Wt 164.4 lb

## 2015-05-07 DIAGNOSIS — I83893 Varicose veins of bilateral lower extremities with other complications: Secondary | ICD-10-CM

## 2015-05-07 DIAGNOSIS — J45909 Unspecified asthma, uncomplicated: Secondary | ICD-10-CM | POA: Diagnosis not present

## 2015-05-07 DIAGNOSIS — N951 Menopausal and female climacteric states: Secondary | ICD-10-CM

## 2015-05-07 DIAGNOSIS — J301 Allergic rhinitis due to pollen: Secondary | ICD-10-CM

## 2015-05-07 DIAGNOSIS — E119 Type 2 diabetes mellitus without complications: Secondary | ICD-10-CM

## 2015-05-07 DIAGNOSIS — E785 Hyperlipidemia, unspecified: Secondary | ICD-10-CM

## 2015-05-07 DIAGNOSIS — I10 Essential (primary) hypertension: Secondary | ICD-10-CM | POA: Diagnosis not present

## 2015-05-07 MED ORDER — ALBUTEROL SULFATE HFA 108 (90 BASE) MCG/ACT IN AERS: 2.0000 | INHALATION_SPRAY | Freq: Four times a day (QID) | RESPIRATORY_TRACT | Status: DC | PRN

## 2015-05-07 MED ORDER — CARVEDILOL 25 MG PO TABS: ORAL_TABLET | ORAL | Status: DC

## 2015-05-07 MED ORDER — AMLODIPINE BESYLATE 5 MG PO TABS: 5.0000 mg | ORAL_TABLET | Freq: Every day | ORAL | Status: DC

## 2015-05-07 MED ORDER — METFORMIN HCL 500 MG PO TABS: 500.0000 mg | ORAL_TABLET | Freq: Every day | ORAL | Status: DC

## 2015-05-07 MED ORDER — ESTROGENS, CONJUGATED 0.625 MG/GM VA CREA: TOPICAL_CREAM | Freq: Every day | VAGINAL | Status: DC

## 2015-05-07 MED ORDER — LOSARTAN POTASSIUM-HCTZ 100-25 MG PO TABS: ORAL_TABLET | ORAL | Status: DC

## 2015-05-07 MED ORDER — MOMETASONE FUROATE 50 MCG/ACT NA SUSP: NASAL | Status: DC

## 2015-05-07 MED ORDER — ROSUVASTATIN CALCIUM 20 MG PO TABS: 20.0000 mg | ORAL_TABLET | ORAL | Status: DC

## 2015-05-07 MED ORDER — BUDESONIDE-FORMOTEROL FUMARATE 160-4.5 MCG/ACT IN AERO: INHALATION_SPRAY | RESPIRATORY_TRACT | Status: DC

## 2015-05-07 MED ORDER — TIOTROPIUM BROMIDE MONOHYDRATE 18 MCG IN CAPS: 18.0000 ug | ORAL_CAPSULE | Freq: Every day | RESPIRATORY_TRACT | Status: DC

## 2015-05-07 NOTE — Progress Notes (Signed)
Patient ID: Tanya Bell, female   DOB: [birth year], 74 y.o.   MRN: [id number]    Location:    [facility]   Place of Service:   OFFICE   Chief Complaint  Patient presents with  . Medical Management of Chronic Issues    ? about Crestor    HPI:  74 yo female seen today for f/u. She no longer has myalgias when she takes crestor 3 times per week but the cost of med still the same ($75 for 3 mos). She has not been taking it on a consistent basis.  She has not been exercising as much. Varicose veins are uncomfortable. She tried compression hose but they did not fit well and she returned them. She would like to see a vein specialist.  BS 110-140s fasting. No low BS reactions. No numbness or tingling. She is taking metformin. She saw eye specialist Dr Leanne Lovely about 2 mos ago. She also sees retinal specialist.  BP stable on amlodipine and coreg.  No asthma exac. She takes symbicort, spiriva and prn proventil HFA.. She has seasonal allergy and takes allegra and nasonex.  Needs new Rx   Past Medical History  Diagnosis Date  . Herpes simplex with other ophthalmic complications   . Disorder of bone and cartilage, unspecified   . Reflux esophagitis   . Candidiasis of mouth   . Other cataract   . Other cataract   . Acute upper respiratory infections of unspecified site   . Essential hypertension, benign   . Thrombocytopenia, unspecified   . Other dystrophy of vulva   . Diverticulitis of colon (without mention of hemorrhage)   . Essential hypertension, benign   . Unspecified disorder of skin and subcutaneous tissue   . Rash and other nonspecific skin eruption   . Type II or unspecified type diabetes mellitus without mention of complication, not stated as uncontrolled   . Other and unspecified hyperlipidemia   . Unspecified essential hypertension   . Acute upper respiratory infections of unspecified site   . Type II or unspecified type diabetes mellitus without mention of complication, not  stated as uncontrolled   . Other and unspecified hyperlipidemia   . Other and unspecified hyperlipidemia   . Unspecified essential hypertension   . Acute upper respiratory infections of unspecified site   . Allergic rhinitis due to pollen   . Extrinsic asthma, unspecified   . Other malaise and fatigue   . Other abnormal glucose     Past Surgical History  Procedure Laterality Date  . Cesarean section      [phone]  . Cyst excision  1974    Patient Care Team: Gildardo Cranker, DO as PCP - General (Internal Medicine)  History   Social History  . Marital Status: Unknown    Spouse Name: N/A  . Number of Children: N/A  . Years of Education: N/A   Occupational History  . Not on file.   Social History Main Topics  . Smoking status: Former Smoker    Types: Cigarettes    Quit date: 12/13/1990  . Smokeless tobacco: Not on file  . Alcohol Use: No  . Drug Use: No  . Sexual Activity:    Partners: Male     Comment: None    Other Topics Concern  . Not on file   Social History Narrative     reports that she quit smoking about 24 years ago. Her smoking use included Cigarettes. She does not have any smokeless tobacco  history on file. She reports that she does not drink alcohol or use illicit drugs.  Allergies  Allergen Reactions  . Sulfa Antibiotics     Medications: Patient's Medications  New Prescriptions   No medications on file  Previous Medications   ACETAMINOPHEN (TYLENOL) 325 MG TABLET    Take 325 mg by mouth daily as needed. For pain   ALBUTEROL (PROVENTIL HFA;VENTOLIN HFA) 108 (90 BASE) MCG/ACT INHALER    Inhale 2 puffs into the lungs every 6 (six) hours as needed for wheezing or shortness of breath.   AMBULATORY NON FORMULARY MEDICATION    Compression stockings: to both legs when out of bed, please remove before going to bed at night 1 pair   AMLODIPINE (NORVASC) 5 MG TABLET    Take 1 tablet (5 mg total) by mouth daily.   ASPIRIN 81 MG TABLET    Take 81  mg by mouth daily.   BIFIDOBACTERIUM INFANTIS (ALIGN) CAPSULE    Take one tablet once a day   BUDESONIDE-FORMOTEROL (SYMBICORT) 160-4.5 MCG/ACT INHALER    Inhale two puffs into the lungs twice daily    Member No. [id number]   CALCIUM-VITAMIN D (OSCAL WITH D) 500-200 MG-UNIT PER TABLET    Take 1 tablet by mouth 2 (two) times daily.   CARVEDILOL (COREG) 25 MG TABLET    Take one tablet two times a day for blood pressure   CONJUGATED ESTROGENS (PREMARIN) VAGINAL CREAM    Place vaginally daily.   FEXOFENADINE (ALLEGRA) 180 MG TABLET    Take 180 mg by mouth daily.   LOSARTAN-HYDROCHLOROTHIAZIDE (HYZAAR) 100-25 MG PER TABLET    TAKE 1 TABLET ONCE A DAY FOR BLOOD PRESSURE   METFORMIN (GLUCOPHAGE) 500 MG TABLET    Take 1 tablet (500 mg total) by mouth daily with breakfast.   MOMETASONE (NASONEX) 50 MCG/ACT NASAL SPRAY    One spray in each nostril twice daily as needed for allergies.   MULTIPLE VITAMIN (MULTIVITAMIN) TABLET    Take 1 tablet by mouth daily.   RANITIDINE (ZANTAC) 150 MG TABLET    Take 150 mg by mouth. Take one tablet by mouth daily in the morning.   ROSUVASTATIN (CRESTOR) 20 MG TABLET    Take 1 tablet (20 mg total) by mouth 3 (three) times a week. Monday, Wednesday, friday   TIOTROPIUM (SPIRIVA) 18 MCG INHALATION CAPSULE    Place 1 capsule (18 mcg total) into inhaler and inhale daily.   VITAMIN C (ASCORBIC ACID) 500 MG TABLET    Take 500 mg by mouth daily.   ZOSTER VACCINE LIVE, PF, (ZOSTAVAX) 01027 UNT/0.65ML INJECTION    Inject 19,400 Units into the skin once.  Modified Medications   No medications on file  Discontinued Medications   No medications on file    Review of Systems  Constitutional: Negative for fever, chills, diaphoresis, activity change, appetite change and fatigue.  HENT: Negative for ear pain and sore throat.   Eyes: Negative for visual disturbance.  Respiratory: Negative for cough, chest tightness and shortness of breath.   Cardiovascular: Positive for leg  swelling. Negative for chest pain and palpitations.  Gastrointestinal: Negative for nausea, vomiting, abdominal pain, diarrhea, constipation and blood in stool.  Genitourinary: Negative for dysuria.  Musculoskeletal: Negative for myalgias and arthralgias.  Neurological: Negative for dizziness, tremors, numbness and headaches.  Psychiatric/Behavioral: Negative for sleep disturbance. The patient is not nervous/anxious.     Filed Vitals:   05/07/15 0822  BP: 136/80  Pulse: 82  Temp: 98.4  F (36.9 C)  TempSrc: Oral  Resp: 18  Height: 5' (1.524 m)  Weight: 164 lb 6.4 oz (74.571 kg)  SpO2: 94%   Body mass index is 32.11 kg/(m^2).  Physical Exam  Constitutional: She is oriented to person, place, and time. She appears well-developed and well-nourished.  HENT:  Mouth/Throat: Oropharynx is clear and moist. No oropharyngeal exudate.  Eyes: Pupils are equal, round, and reactive to light. No scleral icterus.  Neck: Neck supple. No tracheal deviation present. No thyromegaly present.  Cardiovascular: Normal rate, regular rhythm, normal heart sounds and intact distal pulses.  Exam reveals no gallop and no friction rub.   No murmur heard. Trace L>R LE edema b/l. no calf TTP. No carotid bruit b/l. Palpable varicose veins LLE with no nodularity or redness but TTP  Pulmonary/Chest: Effort normal and breath sounds normal. No stridor. No respiratory distress. She has no wheezes. She has no rales.  Abdominal: Soft. Bowel sounds are normal. She exhibits no distension and no mass. There is no tenderness. There is no rebound and no guarding.  Musculoskeletal: She exhibits edema.  Lymphadenopathy:    She has no cervical adenopathy.  Neurological: She is alert and oriented to person, place, and time. She has normal reflexes.  Skin: Skin is warm and dry. No rash noted.  Psychiatric: She has a normal mood and affect. Her behavior is normal. Judgment and thought content normal.   Diabetic Foot Exam - Simple    Simple Foot Form  Diabetic Foot exam was performed with the following findings:  Yes   Visual Inspection  Sensation Testing  Intact to touch and monofilament testing bilaterally:  Yes  Pulse Check  Posterior Tibialis and Dorsalis pulse intact bilaterally:  Yes  Comments  B/l bunion. No calluses or ulcerations        Labs reviewed: Appointment on 05/05/2015  Component Date Value Ref Range Status  . Glucose 05/05/2015 142* 65 - 99 mg/dL Final  . BUN 05/05/2015 17  8 - 27 mg/dL Final  . Creatinine, Ser 05/05/2015 0.68  0.57 - 1.00 mg/dL Final  . GFR calc non Af Amer 05/05/2015 87  >59 mL/min/1.73 Final  . GFR calc Af Amer 05/05/2015 100  >59 mL/min/1.73 Final  . BUN/Creatinine Ratio 05/05/2015 25  11 - 26 Final  . Sodium 05/05/2015 139  134 - 144 mmol/L Final  . Potassium 05/05/2015 3.9  3.5 - 5.2 mmol/L Final  . Chloride 05/05/2015 99  97 - 108 mmol/L Final  . CO2 05/05/2015 22  18 - 29 mmol/L Final  . Calcium 05/05/2015 9.3  8.7 - 10.3 mg/dL Final  . Total Protein 05/05/2015 6.9  6.0 - 8.5 g/dL Final  . Albumin 05/05/2015 4.1  3.5 - 4.8 g/dL Final  . Globulin, Total 05/05/2015 2.8  1.5 - 4.5 g/dL Final  . Albumin/Globulin Ratio 05/05/2015 1.5  1.1 - 2.5 Final  . Bilirubin Total 05/05/2015 0.5  0.0 - 1.2 mg/dL Final  . Alkaline Phosphatase 05/05/2015 61  39 - 117 IU/L Final  . AST 05/05/2015 16  0 - 40 IU/L Final  . ALT 05/05/2015 15  0 - 32 IU/L Final  . WBC 05/05/2015 5.8  3.4 - 10.8 x10E3/uL Final  . RBC 05/05/2015 5.04  3.77 - 5.28 x10E6/uL Final  . Hemoglobin 05/05/2015 13.1  11.1 - 15.9 g/dL Final  . Hematocrit 05/05/2015 38.0  34.0 - 46.6 % Final  . MCV 05/05/2015 75* 79 - 97 fL Final  . MCH 05/05/2015 26.0*  26.6 - 33.0 pg Final  . MCHC 05/05/2015 34.5  31.5 - 35.7 g/dL Final  . RDW 05/05/2015 16.3* 12.3 - 15.4 % Final  . Platelets 05/05/2015 166  150 - 379 x10E3/uL Final  . NEUTROPHILS 05/05/2015 40   Final  . Lymphs 05/05/2015 47   Final  . Monocytes  05/05/2015 8   Final  . Eos 05/05/2015 4   Final  . Basos 05/05/2015 1   Final  . Neutrophils Absolute 05/05/2015 2.3  1.4 - 7.0 x10E3/uL Final  . Lymphocytes Absolute 05/05/2015 2.8  0.7 - 3.1 x10E3/uL Final  . Monocytes Absolute 05/05/2015 0.5  0.1 - 0.9 x10E3/uL Final  . EOS (ABSOLUTE) 05/05/2015 0.2  0.0 - 0.4 x10E3/uL Final  . Basophils Absolute 05/05/2015 0.0  0.0 - 0.2 x10E3/uL Final  . Immature Granulocytes 05/05/2015 0   Final  . Immature Grans (Abs) 05/05/2015 0.0  0.0 - 0.1 x10E3/uL Final  . Hgb A1c MFr Bld 05/05/2015 6.8* 4.8 - 5.6 % Final   Comment:          Pre-diabetes: 5.7 - 6.4          Diabetes: >6.4          Glycemic control for adults with diabetes: <7.0   . Est. average glucose Bld gHb Est-m* 05/05/2015 148   Final  . Cholesterol, Total 05/05/2015 224* 100 - 199 mg/dL Final  . Triglycerides 05/05/2015 59  0 - 149 mg/dL Final  . HDL 05/05/2015 60  >39 mg/dL Final   Comment: According to ATP-III Guidelines, HDL-C >59 mg/dL is considered a negative risk factor for CHD.   Marland Kitchen VLDL Cholesterol Cal 05/05/2015 12  5 - 40 mg/dL Final  . LDL Calculated 05/05/2015 152* 0 - 99 mg/dL Final  . Chol/HDL Ratio 05/05/2015 3.7  0.0 - 4.4 ratio units Final   Comment:                                   T. Chol/HDL Ratio                                             Men  Women                               1/2 Avg.Risk  3.4    3.3                                   Avg.Risk  5.0    4.4                                2X Avg.Risk  9.6    7.1                                3X Avg.Risk 23.4   11.0     No results found.   Assessment/Plan   ICD-9-CM ICD-10-CM   1. Varicose veins of leg with edema, bilateral 454.8 H21.975 Ambulatory referral to Vascular Surgery  2. Hyperlipidemia LDL goal <100 272.4 E78.5 rosuvastatin (CRESTOR)  20 MG tablet     Lipid Panel  3. Essential hypertension 401.9 I10 amLODipine (NORVASC) 5 MG tablet     losartan-hydrochlorothiazide (HYZAAR) 100-25 MG per  tablet     carvedilol (COREG) 25 MG tablet  4. Vaginal dryness, menopausal 627.2 N95.1 conjugated estrogens (PREMARIN) vaginal cream  5. Diabetes mellitus type 2, controlled 250.00 E11.9 metFORMIN (GLUCOPHAGE) 500 MG tablet     CMP     Hemoglobin A1c  6. Allergic rhinitis due to pollen 477.0 J30.1 mometasone (NASONEX) 50 MCG/ACT nasal spray  7. Extrinsic asthma, unspecified asthma severity, uncomplicated 638.93 T34.287 tiotropium (SPIRIVA) 18 MCG inhalation capsule     budesonide-formoterol (SYMBICORT) 160-4.5 MCG/ACT inhaler     albuterol (PROVENTIL HFA;VENTOLIN HFA) 108 (90 BASE) MCG/ACT inhaler    --Recommend increase exercise this summer  --Continue all medications as ordered  --Will call with referral appt  --Follow up in 3 mos for routine visit. Fasting labs prior to appt  [place] S. Perlie Gold  [facility] [address] [place], [place] [address] [phone] Cell (Monday-Friday 8 AM - 5 PM) [phone] After 5 PM and follow prompts

## 2015-05-07 NOTE — Patient Instructions (Signed)
Recommend increase exercise this summer  Continue all medications as ordered  Will call with referral appt  Follow up in 3 mos for routine visit. Fasting labs prior to appt

## 2015-05-16 ENCOUNTER — Other Ambulatory Visit: Payer: Self-pay

## 2015-05-16 DIAGNOSIS — I83893 Varicose veins of bilateral lower extremities with other complications: Secondary | ICD-10-CM

## 2015-06-13 ENCOUNTER — Other Ambulatory Visit: Payer: Self-pay | Admitting: Internal Medicine

## 2015-07-04 ENCOUNTER — Encounter: Payer: Self-pay | Admitting: Vascular Surgery

## 2015-07-09 ENCOUNTER — Ambulatory Visit (HOSPITAL_COMMUNITY)
Admission: RE | Admit: 2015-07-09 | Discharge: 2015-07-09 | Disposition: A | Discharge status: Home / Self Care | Payer: Medicare Other | Source: Ambulatory Visit | Attending: Vascular Surgery | Admitting: Vascular Surgery

## 2015-07-09 ENCOUNTER — Encounter: Payer: Self-pay | Admitting: Vascular Surgery

## 2015-07-09 ENCOUNTER — Ambulatory Visit (INDEPENDENT_AMBULATORY_CARE_PROVIDER_SITE_OTHER): Payer: Medicare Other | Admitting: Vascular Surgery

## 2015-07-09 VITALS — BP 142/72 | HR 64 | Temp 97.9°F | Resp 16 | Ht 61.0 in | Wt 164.0 lb

## 2015-07-09 DIAGNOSIS — I83893 Varicose veins of bilateral lower extremities with other complications: Secondary | ICD-10-CM | POA: Insufficient documentation

## 2015-07-09 DIAGNOSIS — I872 Venous insufficiency (chronic) (peripheral): Secondary | ICD-10-CM | POA: Diagnosis not present

## 2015-07-09 NOTE — Progress Notes (Signed)
[facility] [place]  Patient name: Tanya Bell MRN: [id number] DOB: 10/15/1941 Sex: female  REASON FOR CONSULT: to evaluate bilateral varicose veins  HPI: Tanya Bell is a 74 y.o. female with a long history of varicose veins. She's had these for many years. She states that they appear to be gradually enlarging and therefore presents for evaluation. She denies any previous history of DVT or phlebitis. She experiences some swelling in her legs associated with standing and also some aching pain. Her symptoms are aggravated by standing and relieved somewhat with elevation. She denies any bleeding episodes associated with her varicose veins. She has not worn compression stockings.   Past Medical History  Diagnosis Date  . Herpes simplex with other ophthalmic complications   . Disorder of bone and cartilage, unspecified   . Reflux esophagitis   . Candidiasis of mouth   . Other cataract   . Other cataract   . Acute upper respiratory infections of unspecified site   . Essential hypertension, benign   . Thrombocytopenia, unspecified   . Other dystrophy of vulva   . Diverticulitis of colon (without mention of hemorrhage)   . Essential hypertension, benign   . Unspecified disorder of skin and subcutaneous tissue   . Rash and other nonspecific skin eruption   . Type II or unspecified type diabetes mellitus without mention of complication, not stated as uncontrolled   . Other and unspecified hyperlipidemia   . Unspecified essential hypertension   . Acute upper respiratory infections of unspecified site   . Type II or unspecified type diabetes mellitus without mention of complication, not stated as uncontrolled   . Other and unspecified hyperlipidemia   . Other and unspecified hyperlipidemia   . Unspecified essential hypertension   . Acute upper respiratory infections of unspecified site   . Allergic rhinitis due to pollen   . Extrinsic asthma,  unspecified   . Other malaise and fatigue   . Other abnormal glucose    Family History  Problem Relation Age of Onset  . Cancer Mother     brain  . Stroke Father    SOCIAL HISTORY: History  Substance Use Topics  . Smoking status: Former Smoker    Types: Cigarettes    Quit date: 12/13/1990  . Smokeless tobacco: Not on file  . Alcohol Use: No   Allergies  Allergen Reactions  . Sulfa Antibiotics    Current Outpatient Prescriptions  Medication Sig Dispense Refill  . acetaminophen (TYLENOL) 325 MG tablet Take 325 mg by mouth daily as needed. For pain    . albuterol (PROVENTIL HFA;VENTOLIN HFA) 108 (90 BASE) MCG/ACT inhaler Inhale 2 puffs into the lungs every 6 (six) hours as needed for wheezing or shortness of breath. 3 Inhaler 3  . amLODipine (NORVASC) 5 MG tablet Take 1 tablet (5 mg total) by mouth daily. 90 tablet 3  . budesonide-formoterol (SYMBICORT) 160-4.5 MCG/ACT inhaler Inhale two puffs into the lungs twice daily    Member No. [id number] 3 Inhaler 3  . carvedilol (COREG) 25 MG tablet Take one tablet two times a day for blood pressure 180 tablet 3  . fexofenadine (ALLEGRA) 180 MG tablet Take 180 mg by mouth daily.    Marland Kitchen losartan-hydrochlorothiazide (HYZAAR) 100-25 MG per tablet TAKE 1 TABLET ONCE A DAY FOR BLOOD PRESSURE 90 tablet 2  . metFORMIN (GLUCOPHAGE) 500 MG tablet Take 1 tablet (500 mg total) by mouth daily with breakfast. 90 tablet 3  .  mometasone (NASONEX) 50 MCG/ACT nasal spray One spray in each nostril twice daily as needed for allergies. 51 g 3  . Multiple Vitamin (MULTIVITAMIN) tablet Take 1 tablet by mouth daily.    . ranitidine (ZANTAC) 150 MG tablet Take 150 mg by mouth. Take one tablet by mouth daily in the morning.    . rosuvastatin (CRESTOR) 20 MG tablet TAKE 1 TABLET THREE TIMES A WEEK, MONDAY, WEDNESDAY AND FRIDAY 45 tablet 0  . tiotropium (SPIRIVA) 18 MCG inhalation capsule Place 1 capsule (18 mcg total) into inhaler and inhale daily. 90 capsule 3    . vitamin C (ASCORBIC ACID) 500 MG tablet Take 500 mg by mouth daily.    Marland Kitchen zoster vaccine live, PF, (ZOSTAVAX) 96295 UNT/0.65ML injection Inject 19,400 Units into the skin once. 1 each 0  . AMBULATORY NON FORMULARY MEDICATION Compression stockings: to both legs when out of bed, please remove before going to bed at night 1 pair (Patient not taking: Reported on 07/09/2015) 2 each 3  . aspirin 81 MG tablet Take 81 mg by mouth daily.    . bifidobacterium infantis (ALIGN) capsule Take one tablet once a day (Patient not taking: Reported on 07/09/2015) 30 capsule 3  . calcium-vitamin D (OSCAL WITH D) 500-200 MG-UNIT per tablet Take 1 tablet by mouth 2 (two) times daily. (Patient not taking: Reported on 07/09/2015) 60 tablet 0  . conjugated estrogens (PREMARIN) vaginal cream Place vaginally daily. (Patient not taking: Reported on 07/09/2015) 42.5 g 12   No current facility-administered medications for this visit.   REVIEW OF SYSTEMS: Valu.Nieves ] denotes positive finding; [  ] denotes negative finding  CARDIOVASCULAR:  [ ]  chest pain   [ ]  chest pressure   [ ]  palpitations   [ ]  orthopnea   [ ]  dyspnea on exertion   [ ]  claudication   [ ]  rest pain   [ ]  DVT   [ ]  phlebitis PULMONARY:   [ ]  productive cough   [ ]  asthma   [ ]  wheezing NEUROLOGIC:   [ ]  weakness  [ ]  paresthesias  [ ]  aphasia  [ ]  amaurosis  [ ]  dizziness HEMATOLOGIC:   [ ]  bleeding problems   [ ]  clotting disorders MUSCULOSKELETAL:  [ ]  joint pain   [ ]  joint swelling Valu.Nieves ] leg swelling GASTROINTESTINAL: [ ]   blood in stool  [ ]   hematemesis GENITOURINARY:  [ ]   dysuria  [ ]   hematuria PSYCHIATRIC:  [ ]  history of major depression INTEGUMENTARY:  [ ]  rashes  [ ]  ulcers CONSTITUTIONAL:  [ ]  fever   [ ]  chills  PHYSICAL EXAM: Filed Vitals:   07/09/15 1255 07/09/15 1258  BP: 146/72 142/72  Pulse: 64 64  Temp: 97.9 F (36.6 C)   TempSrc: Oral   Resp: 16   Height: 5\' 1"  (1.549 m)   Weight: 164 lb (74.39 kg)   SpO2: 99%    GENERAL: The  patient is a well-nourished female, in no acute distress. The vital signs are documented above. CARDIAC: There is a regular rate and rhythm.  VASCULAR: I do not detect carotid bruits. She has palpable femoral pulses and pedal pulses bilaterally. PULMONARY: There is good air exchange bilaterally without wheezing or rales. ABDOMEN: Soft and non-tender with normal pitched bowel sounds.  MUSCULOSKELETAL: There are no major deformities or cyanosis. NEUROLOGIC: No focal weakness or paresthesias are detected. SKIN: She has some telangiectasias along the medial aspect of both thighs and both lower legs.  There are some smaller varicosities but no large dilated varicosities noted. PSYCHIATRIC: The patient has a normal affect.  DATA:  I have independently interpreted her venous duplex scan today. On the right side there is no evidence of DVT. There is some reflux in the right common femoral vein. There is no reflux in the right great saphenous vein or small saphenous vein. On the left side, there is no evidence of DVT. There is reflux in the right common femoral vein. There is also reflux in the right great saphenous vein.  MEDICAL ISSUES:  PAINFUL VARICOSE VEINS WITH CHRONIC VENOUS INSUFFICIENCY: This patient has evidence of bilateral deep vein reflux and also reflux in her left great saphenous vein. I have recommended that she elevate her legs and we discussed the proper positioning for this. In addition I written her a prescription for thigh-high compression stockings with a gradient of 20-30 mmHg. I have encouraged her to avoid prolonged sitting and standing. I have encouraged her to continue exercising as much as possible. If her symptoms progress despite the thigh-high stockings then she could potentially be considered for laser ablation of the left great saphenous vein. She will call if her symptoms progress.   Tanya Bell [facility] [place]: [phone]

## 2015-08-05 ENCOUNTER — Other Ambulatory Visit: Payer: Medicare Other

## 2015-08-05 DIAGNOSIS — E785 Hyperlipidemia, unspecified: Secondary | ICD-10-CM

## 2015-08-05 DIAGNOSIS — E119 Type 2 diabetes mellitus without complications: Secondary | ICD-10-CM

## 2015-08-06 LAB — COMPREHENSIVE METABOLIC PANEL
ALK PHOS: 62 IU/L (ref 39–117)
ALT: 17 IU/L (ref 0–32)
AST: 15 IU/L (ref 0–40)
Albumin/Globulin Ratio: 1.5 (ref 1.1–2.5)
Albumin: 4.4 g/dL (ref 3.5–4.8)
BUN/Creatinine Ratio: 24 (ref 11–26)
BUN: 17 mg/dL (ref 8–27)
Bilirubin Total: 0.5 mg/dL (ref 0.0–1.2)
CO2: 24 mmol/L (ref 18–29)
Calcium: 9.6 mg/dL (ref 8.7–10.3)
Chloride: 97 mmol/L (ref 97–108)
Creatinine, Ser: 0.72 mg/dL (ref 0.57–1.00)
GFR calc non Af Amer: 83 mL/min/{1.73_m2} (ref >59)
GFR, EST AFRICAN AMERICAN: 96 mL/min/{1.73_m2} (ref >59)
GLUCOSE: 135 mg/dL — AB (ref 65–99)
Globulin, Total: 3 g/dL (ref 1.5–4.5)
Potassium: 4 mmol/L (ref 3.5–5.2)
Sodium: 140 mmol/L (ref 134–144)
TOTAL PROTEIN: 7.4 g/dL (ref 6.0–8.5)

## 2015-08-06 LAB — LIPID PANEL
CHOLESTEROL TOTAL: 216 mg/dL — AB (ref 100–199)
Chol/HDL Ratio: 3.5 ratio units (ref 0.0–4.4)
HDL: 62 mg/dL (ref >39)
LDL Calculated: 139 mg/dL — ABNORMAL HIGH (ref 0–99)
Triglycerides: 73 mg/dL (ref 0–149)
VLDL CHOLESTEROL CAL: 15 mg/dL (ref 5–40)

## 2015-08-06 LAB — HEMOGLOBIN A1C
Est. average glucose Bld gHb Est-mCnc: 143 mg/dL
Hgb A1c MFr Bld: 6.6 % — ABNORMAL HIGH (ref 4.8–5.6)

## 2015-08-08 ENCOUNTER — Encounter: Payer: Self-pay | Admitting: Internal Medicine

## 2015-08-08 ENCOUNTER — Ambulatory Visit (INDEPENDENT_AMBULATORY_CARE_PROVIDER_SITE_OTHER): Payer: Medicare Other | Admitting: Internal Medicine

## 2015-08-08 VITALS — BP 120/70 | HR 60 | Temp 97.6°F | Resp 20 | Ht 61.0 in | Wt 164.2 lb

## 2015-08-08 DIAGNOSIS — E785 Hyperlipidemia, unspecified: Secondary | ICD-10-CM | POA: Diagnosis not present

## 2015-08-08 DIAGNOSIS — E119 Type 2 diabetes mellitus without complications: Secondary | ICD-10-CM | POA: Diagnosis not present

## 2015-08-08 DIAGNOSIS — J301 Allergic rhinitis due to pollen: Secondary | ICD-10-CM | POA: Diagnosis not present

## 2015-08-08 DIAGNOSIS — J45909 Unspecified asthma, uncomplicated: Secondary | ICD-10-CM | POA: Diagnosis not present

## 2015-08-08 DIAGNOSIS — I10 Essential (primary) hypertension: Secondary | ICD-10-CM | POA: Diagnosis not present

## 2015-08-08 MED ORDER — LOSARTAN POTASSIUM-HCTZ 100-25 MG PO TABS: ORAL_TABLET | ORAL | Status: DC

## 2015-08-08 MED ORDER — EZETIMIBE 10 MG PO TABS: 10.0000 mg | ORAL_TABLET | Freq: Every day | ORAL | Status: DC

## 2015-08-08 MED ORDER — TIOTROPIUM BROMIDE MONOHYDRATE 18 MCG IN CAPS: 18.0000 ug | ORAL_CAPSULE | Freq: Every day | RESPIRATORY_TRACT | Status: DC

## 2015-08-08 NOTE — Patient Instructions (Addendum)
Take zetia on Tuesday, Thursday and Saturday for cholesterol  Continue other medications as ordered  Follow up in 3 mos for routine visit. Fasting labs 2-3 days prior to appt

## 2015-08-08 NOTE — Progress Notes (Signed)
Patient ID: Tanya Bell, female   DOB: May 10, 1941, 74 y.o.   MRN: [id number]    Location:    [facility]   Place of Service:   OFFICE  Chief Complaint  Patient presents with  . Medical Management of Chronic Issues    3 month follow-up    HPI:  74 yo female seen today for f/u. She reports feeling well overall. She has intermittent fatigue. She purchased TED stockings but notes that weather is too hot to wear them.  She needs med RF  DM - no low BS reactions on metformin  HTN - BP controlled on losartan HCT, amlodipine, coreg  Hyperlipidemia - no mylagias. Takes statin 1/2 tab on MWF due to previous rxn of myalgias  Seasonal allergy/asthma - no exacerbations. Last PFTs several years ago in [place]. Stable on nasonex. She uses spiriva, symbicort, proventil hfa  Past Medical History  Diagnosis Date  . Herpes simplex with other ophthalmic complications   . Disorder of bone and cartilage, unspecified   . Reflux esophagitis   . Candidiasis of mouth   . Other cataract   . Other cataract   . Acute upper respiratory infections of unspecified site   . Essential hypertension, benign   . Thrombocytopenia, unspecified   . Other dystrophy of vulva   . Diverticulitis of colon (without mention of hemorrhage)   . Essential hypertension, benign   . Unspecified disorder of skin and subcutaneous tissue   . Rash and other nonspecific skin eruption   . Type II or unspecified type diabetes mellitus without mention of complication, not stated as uncontrolled   . Other and unspecified hyperlipidemia   . Unspecified essential hypertension   . Acute upper respiratory infections of unspecified site   . Type II or unspecified type diabetes mellitus without mention of complication, not stated as uncontrolled   . Other and unspecified hyperlipidemia   . Other and unspecified hyperlipidemia   . Unspecified essential hypertension   . Acute upper respiratory infections of unspecified site   . Allergic  rhinitis due to pollen   . Extrinsic asthma, unspecified   . Other malaise and fatigue   . Other abnormal glucose     Past Surgical History  Procedure Laterality Date  . Cesarean section      [phone]  . Cyst excision  1974    Pilonidal Cyst excision  . Knee arthroscopy Right     for meniscal tear  . Abdominal hysterectomy      Patient Care Team: Gildardo Cranker, DO as PCP - General (Internal Medicine) Teena Irani, MD as Consulting Physician (Gastroenterology) Warden Fillers, MD as Consulting Physician (Ophthalmology) Hurman Horn, MD as Consulting Physician (Ophthalmology) Mickel Fuchs, MD as Referring Physician (Psychiatry)  Social History   Social History  . Marital Status: Unknown    Spouse Name: N/A  . Number of Children: N/A  . Years of Education: N/A   Occupational History  . Not on file.   Social History Main Topics  . Smoking status: Former Smoker    Types: Cigarettes    Quit date: 12/13/1990  . Smokeless tobacco: Never Used  . Alcohol Use: No  . Drug Use: No  . Sexual Activity:    Partners: Male     Comment: None    Other Topics Concern  . Not on file   Social History Narrative     reports that she quit smoking about 24 years ago. Her smoking use included Cigarettes. She has  never used smokeless tobacco. She reports that she does not drink alcohol or use illicit drugs.  Allergies  Allergen Reactions  . Sulfa Antibiotics     Medications: Patient's Medications  New Prescriptions   No medications on file  Previous Medications   ACETAMINOPHEN (TYLENOL) 325 MG TABLET    Take 325 mg by mouth daily as needed. For pain   ALBUTEROL (PROVENTIL HFA;VENTOLIN HFA) 108 (90 BASE) MCG/ACT INHALER    Inhale 2 puffs into the lungs every 6 (six) hours as needed for wheezing or shortness of breath.   AMBULATORY NON FORMULARY MEDICATION    Compression stockings: to both legs when out of bed, please remove before going to bed at night 1 pair    AMLODIPINE (NORVASC) 5 MG TABLET    Take 1 tablet (5 mg total) by mouth daily.   ASPIRIN 81 MG TABLET    Take 81 mg by mouth daily.   BIFIDOBACTERIUM INFANTIS (ALIGN) CAPSULE    Take one tablet once a day   BUDESONIDE-FORMOTEROL (SYMBICORT) 160-4.5 MCG/ACT INHALER    Inhale two puffs into the lungs twice daily    Member No. [id number]   CALCIUM-VITAMIN D (OSCAL WITH D) 500-200 MG-UNIT PER TABLET    Take 1 tablet by mouth 2 (two) times daily.   CARVEDILOL (COREG) 25 MG TABLET    Take one tablet two times a day for blood pressure   CONJUGATED ESTROGENS (PREMARIN) VAGINAL CREAM    Place vaginally daily.   FEXOFENADINE (ALLEGRA) 180 MG TABLET    Take 180 mg by mouth daily.   LOSARTAN-HYDROCHLOROTHIAZIDE (HYZAAR) 100-25 MG PER TABLET    TAKE 1 TABLET ONCE A DAY FOR BLOOD PRESSURE   METFORMIN (GLUCOPHAGE) 500 MG TABLET    Take 1 tablet (500 mg total) by mouth daily with breakfast.   MOMETASONE (NASONEX) 50 MCG/ACT NASAL SPRAY    One spray in each nostril twice daily as needed for allergies.   MULTIPLE VITAMIN (MULTIVITAMIN) TABLET    Take 1 tablet by mouth daily.   RANITIDINE (ZANTAC) 150 MG TABLET    Take 150 mg by mouth. Take one tablet by mouth daily in the morning.   ROSUVASTATIN (CRESTOR) 20 MG TABLET    TAKE 1 TABLET THREE TIMES A WEEK, MONDAY, WEDNESDAY AND FRIDAY   TIOTROPIUM (SPIRIVA) 18 MCG INHALATION CAPSULE    Place 1 capsule (18 mcg total) into inhaler and inhale daily.   VITAMIN C (ASCORBIC ACID) 500 MG TABLET    Take 500 mg by mouth daily.   ZOSTER VACCINE LIVE, PF, (ZOSTAVAX) 27253 UNT/0.65ML INJECTION    Inject 19,400 Units into the skin once.  Modified Medications   No medications on file  Discontinued Medications   No medications on file    Review of Systems  Constitutional: Positive for fatigue. Negative for fever, chills, diaphoresis, activity change and appetite change.  HENT: Negative for ear pain and sore throat.   Eyes: Negative for visual disturbance.  Respiratory:  Negative for cough, chest tightness and shortness of breath.   Cardiovascular: Negative for chest pain, palpitations and leg swelling.  Gastrointestinal: Negative for nausea, vomiting, abdominal pain, diarrhea, constipation and blood in stool.  Genitourinary: Negative for dysuria.  Musculoskeletal: Negative for arthralgias.  Neurological: Negative for dizziness, tremors, numbness and headaches.  Psychiatric/Behavioral: Negative for sleep disturbance. The patient is not nervous/anxious.     Filed Vitals:   08/08/15 0829  BP: 120/70  Pulse: 60  Temp: 97.6 F (36.4 C)  TempSrc: Oral  Resp: 20  Height: 5\' 1"  (1.549 m)  Weight: 164 lb 3.2 oz (74.481 kg)  SpO2: 95%   Body mass index is 31.04 kg/(m^2).  Physical Exam  Constitutional: She is oriented to person, place, and time. She appears well-developed and well-nourished.  HENT:  Mouth/Throat: Oropharynx is clear and moist. No oropharyngeal exudate.  Eyes: Pupils are equal, round, and reactive to light. No scleral icterus.  Neck: Neck supple. Carotid bruit is not present. No tracheal deviation present. No thyromegaly present.  Cardiovascular: Normal rate, regular rhythm, normal heart sounds and intact distal pulses.  Exam reveals no gallop and no friction rub.   No murmur heard. No LE edema b/l. no calf TTP.   Pulmonary/Chest: Effort normal and breath sounds normal. No stridor. No respiratory distress. She has no wheezes. She has no rales.  Abdominal: Soft. Bowel sounds are normal. She exhibits no distension and no mass. There is no hepatomegaly. There is no tenderness. There is no rebound and no guarding.  Lymphadenopathy:    She has no cervical adenopathy.  Neurological: She is alert and oriented to person, place, and time. She has normal reflexes.  Skin: Skin is warm and dry. No rash noted.  Psychiatric: She has a normal mood and affect. Her behavior is normal. Judgment and thought content normal.     Labs  reviewed: Appointment on 08/05/2015  Component Date Value Ref Range Status  . Glucose 08/05/2015 135* 65 - 99 mg/dL Final  . BUN 08/05/2015 17  8 - 27 mg/dL Final  . Creatinine, Ser 08/05/2015 0.72  0.57 - 1.00 mg/dL Final  . GFR calc non Af Amer 08/05/2015 83  >59 mL/min/1.73 Final  . GFR calc Af Amer 08/05/2015 96  >59 mL/min/1.73 Final  . BUN/Creatinine Ratio 08/05/2015 24  11 - 26 Final  . Sodium 08/05/2015 140  134 - 144 mmol/L Final  . Potassium 08/05/2015 4.0  3.5 - 5.2 mmol/L Final  . Chloride 08/05/2015 97  97 - 108 mmol/L Final  . CO2 08/05/2015 24  18 - 29 mmol/L Final  . Calcium 08/05/2015 9.6  8.7 - 10.3 mg/dL Final  . Total Protein 08/05/2015 7.4  6.0 - 8.5 g/dL Final  . Albumin 08/05/2015 4.4  3.5 - 4.8 g/dL Final  . Globulin, Total 08/05/2015 3.0  1.5 - 4.5 g/dL Final  . Albumin/Globulin Ratio 08/05/2015 1.5  1.1 - 2.5 Final  . Bilirubin Total 08/05/2015 0.5  0.0 - 1.2 mg/dL Final  . Alkaline Phosphatase 08/05/2015 62  39 - 117 IU/L Final  . AST 08/05/2015 15  0 - 40 IU/L Final  . ALT 08/05/2015 17  0 - 32 IU/L Final  . Cholesterol, Total 08/05/2015 216* 100 - 199 mg/dL Final  . Triglycerides 08/05/2015 73  0 - 149 mg/dL Final  . HDL 08/05/2015 62  >39 mg/dL Final   Comment: According to ATP-III Guidelines, HDL-C >59 mg/dL is considered a negative risk factor for CHD.   Marland Kitchen VLDL Cholesterol Cal 08/05/2015 15  5 - 40 mg/dL Final  . LDL Calculated 08/05/2015 139* 0 - 99 mg/dL Final  . Chol/HDL Ratio 08/05/2015 3.5  0.0 - 4.4 ratio units Final   Comment:                                   T. Chol/HDL Ratio  Men  Women                               1/2 Avg.Risk  3.4    3.3                                   Avg.Risk  5.0    4.4                                2X Avg.Risk  9.6    7.1                                3X Avg.Risk 23.4   11.0   . Hgb A1c MFr Bld 08/05/2015 6.6* 4.8 - 5.6 % Final   Comment:          Pre-diabetes: 5.7 -  6.4          Diabetes: >6.4          Glycemic control for adults with diabetes: <7.0   . Est. average glucose Bld gHb Est-m* 08/05/2015 143   Final    No results found.   Assessment/Plan   ICD-9-CM ICD-10-CM   1. Hyperlipidemia LDL goal <100 272.4 E78.5   2. Diabetes mellitus type 2, controlled 250.00 E11.9   3. Essential hypertension 401.9 I10 losartan-hydrochlorothiazide (HYZAAR) 100-25 MG per tablet  4. Extrinsic asthma, unspecified asthma severity, uncomplicated 979.89 Q11.941 tiotropium (SPIRIVA) 18 MCG inhalation capsule  5. Allergic rhinitis due to pollen 477.0 J30.1     Take zetia on Tuesday, Thursday and Saturday for cholesterol  Continue other medications as ordered  Follow up in 3 mos for routine visit. Fasting labs 2-3 days prior to appt  [place] S. Perlie Gold  [facility] [address] [place], [place] [address] [phone] Cell (Monday-Friday 8 AM - 5 PM) [phone] After 5 PM and follow prompts

## 2015-09-09 ENCOUNTER — Encounter: Payer: Self-pay | Admitting: *Deleted

## 2015-09-09 DIAGNOSIS — H35363 Drusen (degenerative) of macula, bilateral: Secondary | ICD-10-CM | POA: Diagnosis not present

## 2015-09-09 DIAGNOSIS — H35349 Macular cyst, hole, or pseudohole, unspecified eye: Secondary | ICD-10-CM | POA: Diagnosis not present

## 2015-09-09 DIAGNOSIS — E119 Type 2 diabetes mellitus without complications: Secondary | ICD-10-CM | POA: Diagnosis not present

## 2015-09-09 DIAGNOSIS — H43821 Vitreomacular adhesion, right eye: Secondary | ICD-10-CM | POA: Diagnosis not present

## 2015-09-09 LAB — HM DIABETES EYE EXAM

## 2015-10-14 ENCOUNTER — Ambulatory Visit (INDEPENDENT_AMBULATORY_CARE_PROVIDER_SITE_OTHER): Payer: Medicare Other | Admitting: Nurse Practitioner

## 2015-10-14 ENCOUNTER — Encounter: Payer: Self-pay | Admitting: Nurse Practitioner

## 2015-10-14 VITALS — BP 124/80 | HR 62 | Temp 98.0°F | Ht 61.0 in | Wt 165.0 lb

## 2015-10-14 DIAGNOSIS — K3189 Other diseases of stomach and duodenum: Secondary | ICD-10-CM

## 2015-10-14 DIAGNOSIS — K649 Unspecified hemorrhoids: Secondary | ICD-10-CM | POA: Diagnosis not present

## 2015-10-14 DIAGNOSIS — K319 Disease of stomach and duodenum, unspecified: Secondary | ICD-10-CM

## 2015-10-14 MED ORDER — HYOSCYAMINE SULFATE 0.125 MG SL SUBL: 0.1250 mg | SUBLINGUAL_TABLET | SUBLINGUAL | Status: DC | PRN

## 2015-10-14 MED ORDER — EZETIMIBE 10 MG PO TABS: 10.0000 mg | ORAL_TABLET | Freq: Every day | ORAL | Status: DC

## 2015-10-14 MED ORDER — HYDROCORTISONE 2.5 % RE CREA: 1.0000 "application " | TOPICAL_CREAM | Freq: Two times a day (BID) | RECTAL | Status: DC | PRN

## 2015-10-14 NOTE — Progress Notes (Signed)
Patient ID: Tanya Bell, female   DOB: 08-26-1941, 74 y.o.   MRN: [id number]    PCP: Gildardo Cranker, DO  Advanced Directive information Does patient have an advance directive?: No, Would patient like information on creating an advanced directive?: Yes - Educational materials given  Allergies  Allergen Reactions  . Sulfa Antibiotics     Chief Complaint  Patient presents with  . Acute Visit    Neck pain, gas, cramps, nausea,and spasms in abdomen. Symptoms onset last Wednesday   . Immunizations    Discuss if ok to get flu vaccine or ok to wait until pending OV   . Medication Refill    Renew Zetia      HPI: Patient is a 74 y.o. female seen in the office today due to increase gas and stomach spasm. Some nausea. Feels like gas has irritated her hemorrhoids. Has been going on almost a week- has been taking pepto-bismol and gas-x which has helped some.    Ongoing nausea. Still eating and drinking but then will have increased gas and bubbling. Having formed regular stools.  No fevers or chills.  abdominal spasms and gas pains. Symptoms come and go and are worse after she eats. Also noted gas at night.  Has had this in the past (over a year ago) given a medication which helped spasm.  Using preparation H for the hemorrhoids but feels like she needs something else.  Has been eating a lot of salad lately with vegetable. Has cut back but has not seen an improvement in symptoms.  Review of Systems:  Review of Systems  Constitutional: Positive for fatigue. Negative for fever, chills, diaphoresis, activity change and appetite change.  Eyes: Negative for visual disturbance.  Respiratory: Negative for cough, chest tightness and shortness of breath.   Cardiovascular: Negative for chest pain, palpitations and leg swelling.  Gastrointestinal: Positive for nausea (sometimes ), abdominal pain and abdominal distention (due to increase in gas). Negative for vomiting, diarrhea, constipation and  blood in stool.       Hemorrhoids   Genitourinary: Negative for dysuria and difficulty urinating.  Neurological: Negative for dizziness.    Past Medical History  Diagnosis Date  . Herpes simplex with other ophthalmic complications   . Disorder of bone and cartilage, unspecified   . Reflux esophagitis   . Candidiasis of mouth   . Other cataract   . Other cataract   . Acute upper respiratory infections of unspecified site   . Essential hypertension, benign   . Thrombocytopenia, unspecified ([place])   . Other dystrophy of vulva   . Diverticulitis of colon (without mention of hemorrhage)   . Essential hypertension, benign   . Unspecified disorder of skin and subcutaneous tissue   . Rash and other nonspecific skin eruption   . Type II or unspecified type diabetes mellitus without mention of complication, not stated as uncontrolled   . Other and unspecified hyperlipidemia   . Unspecified essential hypertension   . Acute upper respiratory infections of unspecified site   . Type II or unspecified type diabetes mellitus without mention of complication, not stated as uncontrolled   . Other and unspecified hyperlipidemia   . Other and unspecified hyperlipidemia   . Unspecified essential hypertension   . Acute upper respiratory infections of unspecified site   . Allergic rhinitis due to pollen   . Extrinsic asthma, unspecified   . Other malaise and fatigue   . Other abnormal glucose    Past Surgical History  Procedure Laterality Date  . Cesarean section      [phone]  . Cyst excision  1974    Pilonidal Cyst excision  . Knee arthroscopy Right     for meniscal tear  . Abdominal hysterectomy     Social History:   reports that she quit smoking about 24 years ago. Her smoking use included Cigarettes. She has never used smokeless tobacco. She reports that she does not drink alcohol or use illicit drugs.  Family History  Problem Relation Age of Onset  . Cancer Mother      brain  . Stroke Father     Medications: Patient's Medications  New Prescriptions   No medications on file  Previous Medications   ACETAMINOPHEN (TYLENOL) 325 MG TABLET    Take 325 mg by mouth daily as needed. For pain   ALBUTEROL (PROVENTIL HFA;VENTOLIN HFA) 108 (90 BASE) MCG/ACT INHALER    Inhale 2 puffs into the lungs every 6 (six) hours as needed for wheezing or shortness of breath.   AMBULATORY NON FORMULARY MEDICATION    Compression stockings: to both legs when out of bed, please remove before going to bed at night 1 pair   AMLODIPINE (NORVASC) 5 MG TABLET    Take 1 tablet (5 mg total) by mouth daily.   ASPIRIN 81 MG TABLET    Take 81 mg by mouth daily.   BIFIDOBACTERIUM INFANTIS (ALIGN) CAPSULE    Take one tablet once a day   BUDESONIDE-FORMOTEROL (SYMBICORT) 160-4.5 MCG/ACT INHALER    Inhale two puffs into the lungs twice daily    Member No. [id number]   CALCIUM-VITAMIN D (OSCAL WITH D) 500-200 MG-UNIT PER TABLET    Take 1 tablet by mouth 2 (two) times daily.   CARVEDILOL (COREG) 25 MG TABLET    Take one tablet two times a day for blood pressure   CONJUGATED ESTROGENS (PREMARIN) VAGINAL CREAM    Place vaginally daily.   EZETIMIBE (ZETIA) 10 MG TABLET    Take 1 tablet (10 mg total) by mouth daily.   FEXOFENADINE (ALLEGRA) 180 MG TABLET    Take 180 mg by mouth daily.   LOSARTAN-HYDROCHLOROTHIAZIDE (HYZAAR) 100-25 MG PER TABLET    TAKE 1 TABLET ONCE A DAY FOR BLOOD PRESSURE   METFORMIN (GLUCOPHAGE) 500 MG TABLET    Take 1 tablet (500 mg total) by mouth daily with breakfast.   MOMETASONE (NASONEX) 50 MCG/ACT NASAL SPRAY    One spray in each nostril twice daily as needed for allergies.   MULTIPLE VITAMIN (MULTIVITAMIN) TABLET    Take 1 tablet by mouth daily.   RANITIDINE (ZANTAC) 150 MG TABLET    Take 150 mg by mouth. Take one tablet by mouth daily in the morning.   ROSUVASTATIN (CRESTOR) 20 MG TABLET    TAKE 1 TABLET THREE TIMES A WEEK, MONDAY, WEDNESDAY AND FRIDAY   TIOTROPIUM  (SPIRIVA) 18 MCG INHALATION CAPSULE    Place 1 capsule (18 mcg total) into inhaler and inhale daily.   VITAMIN C (ASCORBIC ACID) 500 MG TABLET    Take 500 mg by mouth daily.  Modified Medications   No medications on file  Discontinued Medications   ZOSTER VACCINE LIVE, PF, (ZOSTAVAX) 67124 UNT/0.65ML INJECTION    Inject 19,400 Units into the skin once.     Physical Exam:  Filed Vitals:   10/14/15 1302  BP: 124/80  Pulse: 62  Temp: 98 F (36.7 C)  TempSrc: Oral  Height: 5\' 1"  (1.549 m)  Weight: 165  lb (74.844 kg)  SpO2: 95%   Body mass index is 31.19 kg/(m^2).  Physical Exam  Constitutional: She is oriented to person, place, and time. She appears well-developed and well-nourished. No distress.  Cardiovascular: Normal rate, regular rhythm and normal heart sounds.   Pulmonary/Chest: Effort normal and breath sounds normal.  Abdominal: Soft. Bowel sounds are normal. She exhibits no distension and no mass. There is no tenderness. There is no rebound and no guarding.  Neurological: She is alert and oriented to person, place, and time.  Skin: Skin is warm and dry. She is not diaphoretic.  Psychiatric: She has a normal mood and affect.   Labs reviewed: Basic Metabolic Panel:  Recent Labs  05/05/15 0927 08/05/15 0933  NA 139 140  K 3.9 4.0  CL 99 97  CO2 22 24  GLUCOSE 142* 135*  BUN 17 17  CREATININE 0.68 0.72  CALCIUM 9.3 9.6   Liver Function Tests:  Recent Labs  05/05/15 0927 08/05/15 0933  AST 16 15  ALT 15 17  ALKPHOS 61 62  BILITOT 0.5 0.5  PROT 6.9 7.4  ALBUMIN 4.1 4.4   No results for input(s): LIPASE, AMYLASE in the last 8760 hours. No results for input(s): AMMONIA in the last 8760 hours. CBC:  Recent Labs  05/05/15 0927  WBC 5.8  NEUTROABS 2.3  HCT 38.0   Lipid Panel:  Recent Labs  05/05/15 0927 08/05/15 0933  CHOL 224* 216*  HDL 60 62  LDLCALC 152* 139*  TRIG 59 73  CHOLHDL 3.7 3.5   TSH: No results for input(s): TSH in the last  8760 hours. A1C: Lab Results  Component Value Date   HGBA1C 6.6* 08/05/2015     Assessment/Plan 1. Spasm of GI tract abdominal exam without acute findings.  - hyoscyamine (LEVSIN SL) 0.125 MG SL tablet; Place 1 tablet (0.125 mg total) under the tongue every 4 (four) hours as needed for cramping.  Dispense: 90 tablet; Refill: 0 - to use prior to meals and at bedtime for the next week- if symptoms fail to improve or worsen to notify sooner -may cont to use simethicone PRN flatus  -will have pt bring in stool for hemoccult  2. Hemorrhoids, unspecified hemorrhoid type -worsened by flatus, not bleeding at this time - hydrocortisone (ANUSOL-HC) 2.5 % rectal cream; Place 1 application rectally 2 (two) times daily as needed for hemorrhoids or itching.  Dispense: 30 g; Refill: 0  Jessica K. Harle Battiest  [facility] [phone] 8 am - 5 pm) [phone] (after hours)

## 2015-10-14 NOTE — Patient Instructions (Addendum)
To use Hyoscyamine 0.125 mg under tongue prior to meals and at bedtime (every 4 hours) until symptoms improve then as needed  anusol rectal cream twice daily as needed for hemorrhoids   To bring back stool card to test of blood.

## 2015-11-10 ENCOUNTER — Other Ambulatory Visit: Payer: Medicare Other

## 2015-11-10 DIAGNOSIS — E119 Type 2 diabetes mellitus without complications: Secondary | ICD-10-CM

## 2015-11-10 DIAGNOSIS — E785 Hyperlipidemia, unspecified: Secondary | ICD-10-CM | POA: Diagnosis not present

## 2015-11-10 DIAGNOSIS — I10 Essential (primary) hypertension: Secondary | ICD-10-CM

## 2015-11-11 LAB — HEMOGLOBIN A1C
Est. average glucose Bld gHb Est-mCnc: 140 mg/dL
Hgb A1c MFr Bld: 6.5 % — ABNORMAL HIGH (ref 4.8–5.6)

## 2015-11-11 LAB — COMPREHENSIVE METABOLIC PANEL
A/G RATIO: 1.3 (ref 1.1–2.5)
ALK PHOS: 63 IU/L (ref 39–117)
ALT: 11 IU/L (ref 0–32)
AST: 9 IU/L (ref 0–40)
Albumin: 3.9 g/dL (ref 3.5–4.8)
BILIRUBIN TOTAL: 0.5 mg/dL (ref 0.0–1.2)
BUN/Creatinine Ratio: 20 (ref 11–26)
BUN: 13 mg/dL (ref 8–27)
CHLORIDE: 101 mmol/L (ref 97–106)
CO2: 24 mmol/L (ref 18–29)
Calcium: 9.3 mg/dL (ref 8.7–10.3)
Creatinine, Ser: 0.66 mg/dL (ref 0.57–1.00)
GFR calc Af Amer: 101 mL/min/{1.73_m2} (ref >59)
GFR, EST NON AFRICAN AMERICAN: 88 mL/min/{1.73_m2} (ref >59)
GLOBULIN, TOTAL: 3 g/dL (ref 1.5–4.5)
Glucose: 134 mg/dL — ABNORMAL HIGH (ref 65–99)
POTASSIUM: 3.9 mmol/L (ref 3.5–5.2)
SODIUM: 139 mmol/L (ref 136–144)
Total Protein: 6.9 g/dL (ref 6.0–8.5)

## 2015-11-11 LAB — LIPID PANEL
CHOL/HDL RATIO: 3.4 ratio (ref 0.0–4.4)
CHOLESTEROL TOTAL: 194 mg/dL (ref 100–199)
HDL: 57 mg/dL (ref >39)
LDL Calculated: 123 mg/dL — ABNORMAL HIGH (ref 0–99)
TRIGLYCERIDES: 68 mg/dL (ref 0–149)
VLDL Cholesterol Cal: 14 mg/dL (ref 5–40)

## 2015-11-12 ENCOUNTER — Encounter: Payer: Self-pay | Admitting: Internal Medicine

## 2015-11-12 ENCOUNTER — Ambulatory Visit (INDEPENDENT_AMBULATORY_CARE_PROVIDER_SITE_OTHER): Payer: Medicare Other | Admitting: Internal Medicine

## 2015-11-12 VITALS — BP 120/96 | HR 59 | Temp 97.9°F | Resp 18 | Ht 61.0 in | Wt 163.0 lb

## 2015-11-12 DIAGNOSIS — Z1239 Encounter for other screening for malignant neoplasm of breast: Secondary | ICD-10-CM

## 2015-11-12 DIAGNOSIS — N6459 Other signs and symptoms in breast: Secondary | ICD-10-CM | POA: Diagnosis not present

## 2015-11-12 DIAGNOSIS — I83893 Varicose veins of bilateral lower extremities with other complications: Secondary | ICD-10-CM | POA: Diagnosis not present

## 2015-11-12 DIAGNOSIS — E119 Type 2 diabetes mellitus without complications: Secondary | ICD-10-CM | POA: Diagnosis not present

## 2015-11-12 DIAGNOSIS — R140 Abdominal distension (gaseous): Secondary | ICD-10-CM | POA: Diagnosis not present

## 2015-11-12 DIAGNOSIS — D229 Melanocytic nevi, unspecified: Secondary | ICD-10-CM

## 2015-11-12 DIAGNOSIS — Z23 Encounter for immunization: Secondary | ICD-10-CM | POA: Diagnosis not present

## 2015-11-12 DIAGNOSIS — J301 Allergic rhinitis due to pollen: Secondary | ICD-10-CM | POA: Diagnosis not present

## 2015-11-12 DIAGNOSIS — I10 Essential (primary) hypertension: Secondary | ICD-10-CM

## 2015-11-12 DIAGNOSIS — E785 Hyperlipidemia, unspecified: Secondary | ICD-10-CM | POA: Diagnosis not present

## 2015-11-12 DIAGNOSIS — L819 Disorder of pigmentation, unspecified: Secondary | ICD-10-CM

## 2015-11-12 NOTE — Progress Notes (Signed)
Patient ID: Tanya Bell, female   DOB: [birth year], 74 y.o.   MRN: [id number]    Location:    [facility]   Place of Service:  OFFICE   Chief Complaint  Patient presents with  . Medical Management of Chronic Issues    3 month follow-up for Hypertension, DM labs printed  . OTHER    Has found small bump in right breast   . Immunizations    Wants flu shot today  . OTHER    Wants a referral for dermitology    HPI:  74 yo female seen today for f/u. She was seen by Janett Billow earlier this month for gas and bloating. She was Rx levsin which "made me feel funny" (malaise) and did not help sx's. She has an appt to see GI later this week. Hemorrhoid med was not effective.  She c/o right breast bump x 2 mos, no pain. She was able to squeeze out hard white bead. No hx breast cancer. Last mammogram in 2014.   She does have nightly leg cramps that cause her to get up and walk and/or massage the area.   She requests dermatology referral for skin eval and mole she is c/a on her back. She has had it x several yrs and no bleeding noted.  DM - no low BS reactions on metformin. A1c 6.5%  HTN - BP controlled on losartan HCT, amlodipine, coreg  Hyperlipidemia - no myalgias. Takes statin 1/2 tab on MWF due to previous rxn of myalgias. LDL now 123 (down from 139)  Seasonal allergy/asthma - no exacerbations. Last PFTs several years ago in [place]. Stable on nasonex. She uses spiriva, symbicort, proventil hfa   Past Medical History  Diagnosis Date  . Herpes simplex with other ophthalmic complications   . Disorder of bone and cartilage, unspecified   . Reflux esophagitis   . Candidiasis of mouth   . Other cataract   . Other cataract   . Acute upper respiratory infections of unspecified site   . Essential hypertension, benign   . Thrombocytopenia, unspecified ([place])   . Other dystrophy of vulva   . Diverticulitis of colon (without mention of hemorrhage)   . Essential hypertension, benign   . Unspecified  disorder of skin and subcutaneous tissue   . Rash and other nonspecific skin eruption   . Type II or unspecified type diabetes mellitus without mention of complication, not stated as uncontrolled   . Other and unspecified hyperlipidemia   . Unspecified essential hypertension   . Acute upper respiratory infections of unspecified site   . Type II or unspecified type diabetes mellitus without mention of complication, not stated as uncontrolled   . Other and unspecified hyperlipidemia   . Other and unspecified hyperlipidemia   . Unspecified essential hypertension   . Acute upper respiratory infections of unspecified site   . Allergic rhinitis due to pollen   . Extrinsic asthma, unspecified   . Other malaise and fatigue   . Other abnormal glucose     Past Surgical History  Procedure Laterality Date  . Cesarean section      [id number]  . Cyst excision  1974    Pilonidal Cyst excision  . Knee arthroscopy Right     for meniscal tear  . Abdominal hysterectomy      Patient Care Team: Gildardo Cranker, DO as PCP - General (Internal Medicine) Teena Irani, MD as Consulting Physician (Gastroenterology) Warden Fillers, MD as Consulting Physician (Ophthalmology) Hurman Horn, MD  as Consulting Physician (Ophthalmology) Mickel Fuchs, MD as Referring Physician (Psychiatry)  Social History   Social History  . Marital Status: Unknown    Spouse Name: N/A  . Number of Children: N/A  . Years of Education: N/A   Occupational History  . Not on file.   Social History Main Topics  . Smoking status: Former Smoker    Types: Cigarettes    Quit date: 12/13/1990  . Smokeless tobacco: Never Used  . Alcohol Use: No  . Drug Use: No  . Sexual Activity:    Partners: Male     Comment: None    Other Topics Concern  . Not on file   Social History Narrative     reports that she quit smoking about 24 years ago. Her smoking use included Cigarettes. She has never used smokeless tobacco.  She reports that she does not drink alcohol or use illicit drugs.  Allergies  Allergen Reactions  . Sulfa Antibiotics     Medications: Patient's Medications  New Prescriptions   No medications on file  Previous Medications   ACETAMINOPHEN (TYLENOL) 325 MG TABLET    Take 325 mg by mouth daily as needed. For pain   ALBUTEROL (PROVENTIL HFA;VENTOLIN HFA) 108 (90 BASE) MCG/ACT INHALER    Inhale 2 puffs into the lungs every 6 (six) hours as needed for wheezing or shortness of breath.   AMBULATORY NON FORMULARY MEDICATION    Compression stockings: to both legs when out of bed, please remove before going to bed at night 1 pair   AMLODIPINE (NORVASC) 5 MG TABLET    Take 1 tablet (5 mg total) by mouth daily.   ASPIRIN 81 MG TABLET    Take 81 mg by mouth daily.   BIFIDOBACTERIUM INFANTIS (ALIGN) CAPSULE    Take one tablet once a day   BUDESONIDE-FORMOTEROL (SYMBICORT) 160-4.5 MCG/ACT INHALER    Inhale two puffs into the lungs twice daily    Member No. [id number]   CALCIUM-VITAMIN D (OSCAL WITH D) 500-200 MG-UNIT PER TABLET    Take 1 tablet by mouth 2 (two) times daily.   CARVEDILOL (COREG) 25 MG TABLET    Take one tablet two times a day for blood pressure   CONJUGATED ESTROGENS (PREMARIN) VAGINAL CREAM    Place vaginally daily.   EZETIMIBE (ZETIA) 10 MG TABLET    Take 1 tablet (10 mg total) by mouth daily.   FEXOFENADINE (ALLEGRA) 180 MG TABLET    Take 180 mg by mouth daily.   HYDROCORTISONE (ANUSOL-HC) 2.5 % RECTAL CREAM    Place 1 application rectally 2 (two) times daily as needed for hemorrhoids or itching.   HYOSCYAMINE (LEVSIN SL) 0.125 MG SL TABLET    Place 1 tablet (0.125 mg total) under the tongue every 4 (four) hours as needed for cramping.   LOSARTAN-HYDROCHLOROTHIAZIDE (HYZAAR) 100-25 MG PER TABLET    TAKE 1 TABLET ONCE A DAY FOR BLOOD PRESSURE   METFORMIN (GLUCOPHAGE) 500 MG TABLET    Take 1 tablet (500 mg total) by mouth daily with breakfast.   MOMETASONE (NASONEX) 50 MCG/ACT  NASAL SPRAY    One spray in each nostril twice daily as needed for allergies.   MULTIPLE VITAMIN (MULTIVITAMIN) TABLET    Take 1 tablet by mouth daily.   RANITIDINE (ZANTAC) 150 MG TABLET    Take 150 mg by mouth. Take one tablet by mouth daily in the morning.   ROSUVASTATIN (CRESTOR) 20 MG TABLET    TAKE 1 TABLET THREE  TIMES A WEEK, MONDAY, WEDNESDAY AND FRIDAY   TIOTROPIUM (SPIRIVA) 18 MCG INHALATION CAPSULE    Place 1 capsule (18 mcg total) into inhaler and inhale daily.   VITAMIN C (ASCORBIC ACID) 500 MG TABLET    Take 500 mg by mouth daily.  Modified Medications   No medications on file  Discontinued Medications   No medications on file    Review of Systems  Constitutional: Negative for fever, chills, diaphoresis, activity change, appetite change and fatigue.  HENT: Negative for ear pain and sore throat.   Eyes: Negative for visual disturbance.  Respiratory: Negative for cough, chest tightness and shortness of breath.   Cardiovascular: Negative for chest pain, palpitations and leg swelling.  Gastrointestinal: Positive for abdominal pain and abdominal distention. Negative for nausea, vomiting, diarrhea, constipation and blood in stool.  Genitourinary: Negative for dysuria.  Musculoskeletal: Positive for myalgias and arthralgias.  Skin: Negative for rash.  Neurological: Negative for dizziness, tremors, numbness and headaches.  Psychiatric/Behavioral: Negative for sleep disturbance. The patient is not nervous/anxious.     Filed Vitals:   11/12/15 0839  BP: 120/96  Pulse: 59  Temp: 97.9 F (36.6 C)  TempSrc: Oral  Resp: 18  Height: 5\' 1"  (1.549 m)  Weight: 163 lb (73.936 kg)  SpO2: 94%   Body mass index is 30.81 kg/(m^2).  Physical Exam  Constitutional: She is oriented to person, place, and time. She appears well-developed and well-nourished.  HENT:  Mouth/Throat: Oropharynx is clear and moist. No oropharyngeal exudate.  Eyes: Pupils are equal, round, and reactive to  light. No scleral icterus.  Neck: Neck supple. Carotid bruit is not present. No tracheal deviation present. No thyromegaly present.  Cardiovascular: Normal rate, regular rhythm, normal heart sounds and intact distal pulses.  Exam reveals no gallop and no friction rub.   No murmur heard. No LE edema b/l. no calf TTP.   Pulmonary/Chest: Effort normal and breath sounds normal. No stridor. No respiratory distress. She has no wheezes. She has no rales. Right breast exhibits no inverted nipple, no mass, no nipple discharge, no skin change and no tenderness. Breasts are symmetrical.  Abdominal: Soft. Bowel sounds are normal. She exhibits distension. She exhibits no mass. There is no hepatomegaly. There is tenderness (epigastric). There is no rebound and no guarding.  Musculoskeletal: She exhibits edema and tenderness.  Lymphadenopathy:    She has no cervical adenopathy.  Neurological: She is alert and oriented to person, place, and time.  Skin: Skin is warm and dry. No rash noted.     Facial areas of hypopigmentation and hyperpigmentation; multiple nevi on back and truck  Psychiatric: She has a normal mood and affect. Her behavior is normal. Judgment and thought content normal.     Labs reviewed: Appointment on 11/10/2015  Component Date Value Ref Range Status  . Glucose 11/10/2015 134* 65 - 99 mg/dL Final  . BUN 11/10/2015 13  8 - 27 mg/dL Final  . Creatinine, Ser 11/10/2015 0.66  0.57 - 1.00 mg/dL Final  . GFR calc non Af Amer 11/10/2015 88  >59 mL/min/1.73 Final  . GFR calc Af Amer 11/10/2015 101  >59 mL/min/1.73 Final  . BUN/Creatinine Ratio 11/10/2015 20  11 - 26 Final  . Sodium 11/10/2015 139  136 - 144 mmol/L Final   Comment: **Effective November 24, 2015 the reference interval**   for Sodium, Serum will be changing to:  134 - 144   . Potassium 11/10/2015 3.9  3.5 - 5.2 mmol/L Final  . Chloride 11/10/2015 101  97 - 106 mmol/L Final    Comment: **Effective November 24, 2015 the reference interval**   for Chloride, Serum will be changing to:                                              96 - 106   . CO2 11/10/2015 24  18 - 29 mmol/L Final  . Calcium 11/10/2015 9.3  8.7 - 10.3 mg/dL Final  . Total Protein 11/10/2015 6.9  6.0 - 8.5 g/dL Final  . Albumin 11/10/2015 3.9  3.5 - 4.8 g/dL Final  . Globulin, Total 11/10/2015 3.0  1.5 - 4.5 g/dL Final  . Albumin/Globulin Ratio 11/10/2015 1.3  1.1 - 2.5 Final  . Bilirubin Total 11/10/2015 0.5  0.0 - 1.2 mg/dL Final  . Alkaline Phosphatase 11/10/2015 63  39 - 117 IU/L Final  . AST 11/10/2015 9  0 - 40 IU/L Final  . ALT 11/10/2015 11  0 - 32 IU/L Final  . Cholesterol, Total 11/10/2015 194  100 - 199 mg/dL Final  . Triglycerides 11/10/2015 68  0 - 149 mg/dL Final  . HDL 11/10/2015 57  >39 mg/dL Final  . VLDL Cholesterol Cal 11/10/2015 14  5 - 40 mg/dL Final  . LDL Calculated 11/10/2015 123* 0 - 99 mg/dL Final  . Chol/HDL Ratio 11/10/2015 3.4  0.0 - 4.4 ratio units Final   Comment:                                   T. Chol/HDL Ratio                                             Men  Women                               1/2 Avg.Risk  3.4    3.3                                   Avg.Risk  5.0    4.4                                2X Avg.Risk  9.6    7.1                                3X Avg.Risk 23.4   11.0   . Hgb A1c MFr Bld 11/10/2015 6.5* 4.8 - 5.6 % Final   Comment:          Pre-diabetes: 5.7 - 6.4          Diabetes: >6.4          Glycemic control for adults with diabetes: <7.0   . Est. average glucose Bld gHb Est-m* 11/10/2015 140   Final  Abstract on 09/09/2015  Component Date Value Ref  Range Status  . HM Diabetic Eye Exam 09/09/2015 No Retinopathy  No Retinopathy Final   [facility] [place] [phone]    No results found.   Assessment/Plan   ICD-9-CM ICD-10-CM   1. Abdominal bloating - probable GERD 787.3 R14.0   2. Controlled type 2 diabetes mellitus  without complication, without long-term current use of insulin (HCC) 250.00 E11.9   3. Essential hypertension - stable 401.9 I10   4. Hyperlipidemia LDL goal <100 - stable 272.4 E78.5   5. Seasonal allergic rhinitis due to pollen 477.0 J30.1   6. Varicose veins of leg with edema, bilateral - improved 454.8 I83.893   7. Hyperpigmentation of skin 709.00 L81.9 Ambulatory referral to Dermatology  8. Breast symptom or sign in female 611.79 N64.59    probable milk duct occlusion; resolved  9. Multiple nevi - no obvious asymmetrical nevi 216.9 D22.9   10. Breast cancer screening V76.10 Z12.39 MM Digital Screening  11. Need for prophylactic vaccination and inoculation against influenza V04.81 Z23 Flu Vaccine QUAD 36+ mos PF IM (Fluarix & Fluzone Quad PF)   Recommend she takes 1 teaspoon yellow mustard at bedtime for leg cramps  Continue current medications as ordered  Flu shot given today  Will call with referral appts  Follow up in 3 mos for routine visit. Fasting labs prior to appt (CMP, lipid and A1c)  Demarquis Osley S. Perlie Gold  [facility] [address] [place], [place] [address] [phone] Cell (Monday-Friday 8 AM - 5 PM) [phone] After 5 PM and follow prompts

## 2015-11-12 NOTE — Patient Instructions (Addendum)
Recommend she takes 1 teaspoon yellow mustard at bedtime for leg cramps  Continue current medications as ordered  Flu shot given today  Will call with referral appts  Follow up in 3 mos for routine visit. Fasting labs prior to appt

## 2015-11-14 DIAGNOSIS — R1032 Left lower quadrant pain: Secondary | ICD-10-CM | POA: Diagnosis not present

## 2015-11-14 DIAGNOSIS — R1030 Lower abdominal pain, unspecified: Secondary | ICD-10-CM | POA: Diagnosis not present

## 2015-11-14 DIAGNOSIS — Z1211 Encounter for screening for malignant neoplasm of colon: Secondary | ICD-10-CM | POA: Diagnosis not present

## 2015-11-14 DIAGNOSIS — R1011 Right upper quadrant pain: Secondary | ICD-10-CM | POA: Diagnosis not present

## 2015-11-14 DIAGNOSIS — R143 Flatulence: Secondary | ICD-10-CM | POA: Diagnosis not present

## 2015-11-27 ENCOUNTER — Other Ambulatory Visit: Payer: Self-pay | Admitting: Internal Medicine

## 2015-11-27 DIAGNOSIS — Z1231 Encounter for screening mammogram for malignant neoplasm of breast: Secondary | ICD-10-CM

## 2015-12-17 ENCOUNTER — Ambulatory Visit
Admission: RE | Admit: 2015-12-17 | Discharge: 2015-12-17 | Disposition: A | Discharge status: Home / Self Care | Payer: Medicare Other | Source: Ambulatory Visit | Attending: Internal Medicine | Admitting: Internal Medicine

## 2015-12-17 DIAGNOSIS — Z1231 Encounter for screening mammogram for malignant neoplasm of breast: Secondary | ICD-10-CM | POA: Diagnosis not present

## 2015-12-24 DIAGNOSIS — K5730 Diverticulosis of large intestine without perforation or abscess without bleeding: Secondary | ICD-10-CM | POA: Diagnosis not present

## 2015-12-24 DIAGNOSIS — Z1211 Encounter for screening for malignant neoplasm of colon: Secondary | ICD-10-CM | POA: Diagnosis not present

## 2016-01-01 DIAGNOSIS — L218 Other seborrheic dermatitis: Secondary | ICD-10-CM | POA: Diagnosis not present

## 2016-01-01 DIAGNOSIS — L821 Other seborrheic keratosis: Secondary | ICD-10-CM | POA: Diagnosis not present

## 2016-01-01 DIAGNOSIS — D1801 Hemangioma of skin and subcutaneous tissue: Secondary | ICD-10-CM | POA: Diagnosis not present

## 2016-01-14 DIAGNOSIS — H35349 Macular cyst, hole, or pseudohole, unspecified eye: Secondary | ICD-10-CM | POA: Diagnosis not present

## 2016-01-14 DIAGNOSIS — H43821 Vitreomacular adhesion, right eye: Secondary | ICD-10-CM | POA: Diagnosis not present

## 2016-01-14 DIAGNOSIS — E119 Type 2 diabetes mellitus without complications: Secondary | ICD-10-CM | POA: Diagnosis not present

## 2016-01-14 DIAGNOSIS — H35363 Drusen (degenerative) of macula, bilateral: Secondary | ICD-10-CM | POA: Diagnosis not present

## 2016-01-20 DIAGNOSIS — E119 Type 2 diabetes mellitus without complications: Secondary | ICD-10-CM | POA: Diagnosis not present

## 2016-01-20 DIAGNOSIS — H353131 Nonexudative age-related macular degeneration, bilateral, early dry stage: Secondary | ICD-10-CM | POA: Diagnosis not present

## 2016-01-20 DIAGNOSIS — Z961 Presence of intraocular lens: Secondary | ICD-10-CM | POA: Diagnosis not present

## 2016-01-20 LAB — HM DIABETES EYE EXAM

## 2016-02-11 ENCOUNTER — Other Ambulatory Visit: Payer: Medicare Other

## 2016-02-11 DIAGNOSIS — E119 Type 2 diabetes mellitus without complications: Secondary | ICD-10-CM | POA: Diagnosis not present

## 2016-02-11 DIAGNOSIS — E785 Hyperlipidemia, unspecified: Secondary | ICD-10-CM | POA: Diagnosis not present

## 2016-02-12 LAB — COMPREHENSIVE METABOLIC PANEL
ALT: 16 IU/L (ref 0–32)
AST: 15 IU/L (ref 0–40)
Albumin/Globulin Ratio: 1.4 (ref 1.1–2.5)
Albumin: 4.2 g/dL (ref 3.5–4.8)
Alkaline Phosphatase: 56 IU/L (ref 39–117)
BUN/Creatinine Ratio: 17 (ref 11–26)
BUN: 12 mg/dL (ref 8–27)
Bilirubin Total: 0.5 mg/dL (ref 0.0–1.2)
CO2: 25 mmol/L (ref 18–29)
Calcium: 9.6 mg/dL (ref 8.7–10.3)
Chloride: 99 mmol/L (ref 96–106)
Creatinine, Ser: 0.71 mg/dL (ref 0.57–1.00)
GFR calc Af Amer: 98 mL/min/{1.73_m2} (ref >59)
GFR calc non Af Amer: 85 mL/min/{1.73_m2} (ref >59)
Globulin, Total: 3 g/dL (ref 1.5–4.5)
Glucose: 145 mg/dL — ABNORMAL HIGH (ref 65–99)
Potassium: 4.2 mmol/L (ref 3.5–5.2)
Sodium: 140 mmol/L (ref 134–144)
Total Protein: 7.2 g/dL (ref 6.0–8.5)

## 2016-02-12 LAB — LIPID PANEL
Chol/HDL Ratio: 2.4 ratio units (ref 0.0–4.4)
Cholesterol, Total: 131 mg/dL (ref 100–199)
HDL: 54 mg/dL (ref >39)
LDL Calculated: 67 mg/dL (ref 0–99)
Triglycerides: 52 mg/dL (ref 0–149)
VLDL Cholesterol Cal: 10 mg/dL (ref 5–40)

## 2016-02-12 LAB — HEMOGLOBIN A1C
Est. average glucose Bld gHb Est-mCnc: 140 mg/dL
Hgb A1c MFr Bld: 6.5 % — ABNORMAL HIGH (ref 4.8–5.6)

## 2016-02-13 ENCOUNTER — Encounter: Payer: Self-pay | Admitting: Internal Medicine

## 2016-02-13 ENCOUNTER — Ambulatory Visit (INDEPENDENT_AMBULATORY_CARE_PROVIDER_SITE_OTHER): Payer: Medicare Other | Admitting: Internal Medicine

## 2016-02-13 VITALS — BP 118/68 | HR 56 | Temp 97.9°F | Resp 20 | Ht 61.0 in | Wt 165.8 lb

## 2016-02-13 DIAGNOSIS — E119 Type 2 diabetes mellitus without complications: Secondary | ICD-10-CM

## 2016-02-13 DIAGNOSIS — E785 Hyperlipidemia, unspecified: Secondary | ICD-10-CM | POA: Diagnosis not present

## 2016-02-13 DIAGNOSIS — J301 Allergic rhinitis due to pollen: Secondary | ICD-10-CM

## 2016-02-13 DIAGNOSIS — J45909 Unspecified asthma, uncomplicated: Secondary | ICD-10-CM | POA: Diagnosis not present

## 2016-02-13 DIAGNOSIS — E669 Obesity, unspecified: Secondary | ICD-10-CM | POA: Diagnosis not present

## 2016-02-13 DIAGNOSIS — I10 Essential (primary) hypertension: Secondary | ICD-10-CM

## 2016-02-13 NOTE — Patient Instructions (Signed)
Continue current medications  Increase exercise as tolerated  Continue diet program  Follow up in 3 months for routine visit. Fasting labs prior to appt

## 2016-02-13 NOTE — Progress Notes (Signed)
Patient ID: Tanya Bell, female   DOB: 1941-12-05, 75 y.o.   MRN: [id number]    Location:    [facility]   Place of Service:  OFFICE   Chief Complaint  Patient presents with  . Medical Management of Chronic Issues    3 month follow-up for routine visit    HPI:  75 yo female seen today for f/u.  DM - no low BS reactions on metformin. A1c 6.5%. BS at home fluctuating but believes related to meter. She has ariva meter.  HTN - BP controlled on losartan HCT, amlodipine, coreg. She also takes ASA daily  Hyperlipidemia - no myalgias. Takes crestor 1/2 tab on MWF due to previous rxn of myalgias and daily zetia. LDL now 67 (down from 123)  Seasonal allergy/asthma - no exacerbations. Last PFTs several years ago in [place]. Stable on nasonex. She uses spiriva, symbicort, proventil hfa  Vaginal dryness - stable on premarin cream  GERD - stable on zantac. Takes align. She did not like the way levsin made her feel (tongue felt numb) and she stopped taking it   Skin lesions - she saw derm in Jan 2017 and dx with SK, seborrhea dermatitis and cherry angioma. She was rx ketoconazole 2% crm to apply to cheek area which has helped.    Past Medical History  Diagnosis Date  . Herpes simplex with other ophthalmic complications   . Disorder of bone and cartilage, unspecified   . Reflux esophagitis   . Candidiasis of mouth   . Other cataract   . Other cataract   . Acute upper respiratory infections of unspecified site   . Essential hypertension, benign   . Thrombocytopenia, unspecified ([place])   . Other dystrophy of vulva   . Diverticulitis of colon (without mention of hemorrhage)   . Essential hypertension, benign   . Unspecified disorder of skin and subcutaneous tissue   . Rash and other nonspecific skin eruption   . Type II or unspecified type diabetes mellitus without mention of complication, not stated as uncontrolled   . Other and unspecified hyperlipidemia   . Unspecified essential  hypertension   . Acute upper respiratory infections of unspecified site   . Type II or unspecified type diabetes mellitus without mention of complication, not stated as uncontrolled   . Other and unspecified hyperlipidemia   . Other and unspecified hyperlipidemia   . Unspecified essential hypertension   . Acute upper respiratory infections of unspecified site   . Allergic rhinitis due to pollen   . Extrinsic asthma, unspecified   . Other malaise and fatigue   . Other abnormal glucose     Past Surgical History  Procedure Laterality Date  . Cesarean section      [phone]  . Cyst excision  1974    Pilonidal Cyst excision  . Knee arthroscopy Right     for meniscal tear  . Abdominal hysterectomy      Patient Care Team: Gildardo Cranker, DO as PCP - General (Internal Medicine) Teena Irani, MD as Consulting Physician (Gastroenterology) Warden Fillers, MD as Consulting Physician (Ophthalmology) Hurman Horn, MD as Consulting Physician (Ophthalmology) Mickel Fuchs, MD as Referring Physician (Psychiatry)  Social History   Social History  . Marital Status: Unknown    Spouse Name: N/A  . Number of Children: N/A  . Years of Education: N/A   Occupational History  . Not on file.   Social History Main Topics  . Smoking status: Former Smoker  Types: Cigarettes    Quit date: 12/13/1990  . Smokeless tobacco: Never Used  . Alcohol Use: No  . Drug Use: No  . Sexual Activity:    Partners: Male     Comment: None    Other Topics Concern  . Not on file   Social History Narrative     reports that she quit smoking about 25 years ago. Her smoking use included Cigarettes. She has never used smokeless tobacco. She reports that she does not drink alcohol or use illicit drugs.  Allergies  Allergen Reactions  . Sulfa Antibiotics     Medications: Patient's Medications  New Prescriptions   No medications on file  Previous Medications   ACETAMINOPHEN (TYLENOL) 325 MG  TABLET    Take 325 mg by mouth daily as needed. For pain   ALBUTEROL (PROVENTIL HFA;VENTOLIN HFA) 108 (90 BASE) MCG/ACT INHALER    Inhale 2 puffs into the lungs every 6 (six) hours as needed for wheezing or shortness of breath.   AMBULATORY NON FORMULARY MEDICATION    Compression stockings: to both legs when out of bed, please remove before going to bed at night 1 pair   AMLODIPINE (NORVASC) 5 MG TABLET    Take 1 tablet (5 mg total) by mouth daily.   ASPIRIN 81 MG TABLET    Take 81 mg by mouth daily.   BIFIDOBACTERIUM INFANTIS (ALIGN) CAPSULE    Take one tablet once a day   BUDESONIDE-FORMOTEROL (SYMBICORT) 160-4.5 MCG/ACT INHALER    Inhale two puffs into the lungs twice daily    Member No. [id number]   CALCIUM-VITAMIN D (OSCAL WITH D) 500-200 MG-UNIT PER TABLET    Take 1 tablet by mouth 2 (two) times daily.   CARVEDILOL (COREG) 25 MG TABLET    Take one tablet two times a day for blood pressure   CONJUGATED ESTROGENS (PREMARIN) VAGINAL CREAM    Place vaginally daily.   EZETIMIBE (ZETIA) 10 MG TABLET    Take 1 tablet (10 mg total) by mouth daily.   FEXOFENADINE (ALLEGRA) 180 MG TABLET    Take 180 mg by mouth daily.   HYDROCORTISONE (ANUSOL-HC) 2.5 % RECTAL CREAM    Place 1 application rectally 2 (two) times daily as needed for hemorrhoids or itching.   HYOSCYAMINE (LEVSIN SL) 0.125 MG SL TABLET    Place 1 tablet (0.125 mg total) under the tongue every 4 (four) hours as needed for cramping.   LOSARTAN-HYDROCHLOROTHIAZIDE (HYZAAR) 100-25 MG PER TABLET    TAKE 1 TABLET ONCE A DAY FOR BLOOD PRESSURE   METFORMIN (GLUCOPHAGE) 500 MG TABLET    Take 1 tablet (500 mg total) by mouth daily with breakfast.   MOMETASONE (NASONEX) 50 MCG/ACT NASAL SPRAY    One spray in each nostril twice daily as needed for allergies.   MULTIPLE VITAMIN (MULTIVITAMIN) TABLET    Take 1 tablet by mouth daily.   RANITIDINE (ZANTAC) 150 MG TABLET    Take 150 mg by mouth. Take one tablet by mouth daily in the morning.    ROSUVASTATIN (CRESTOR) 20 MG TABLET    TAKE 1 TABLET THREE TIMES A WEEK, MONDAY, WEDNESDAY AND FRIDAY   TIOTROPIUM (SPIRIVA) 18 MCG INHALATION CAPSULE    Place 1 capsule (18 mcg total) into inhaler and inhale daily.   VITAMIN C (ASCORBIC ACID) 500 MG TABLET    Take 500 mg by mouth daily.  Modified Medications   No medications on file  Discontinued Medications   No medications on file  Review of Systems  Constitutional: Negative for fever, chills, diaphoresis, activity change, appetite change and fatigue.  HENT: Negative for ear pain and sore throat.   Eyes: Negative for visual disturbance.  Respiratory: Negative for cough, chest tightness and shortness of breath.   Cardiovascular: Negative for chest pain, palpitations and leg swelling.  Gastrointestinal: Negative for nausea, vomiting, abdominal pain, diarrhea, constipation and blood in stool.  Genitourinary: Negative for dysuria.  Musculoskeletal: Negative for myalgias and arthralgias.  Skin: Positive for rash.  Neurological: Negative for dizziness, tremors, numbness and headaches.  Psychiatric/Behavioral: Negative for sleep disturbance. The patient is not nervous/anxious.     Filed Vitals:   02/13/16 0930  BP: 118/68  Pulse: 56  Temp: 97.9 F (36.6 C)  TempSrc: Oral  Resp: 20  Height: 5\' 1"  ([id number] m)  Weight: 165 lb 12.8 oz (75.206 kg)  SpO2: 95%   Body mass index is 31.34 kg/(m^2).  Physical Exam  Constitutional: She is oriented to person, place, and time. She appears well-developed and well-nourished.  HENT:  Mouth/Throat: Oropharynx is clear and moist. No oropharyngeal exudate.  Eyes: Pupils are equal, round, and reactive to light. No scleral icterus.  Neck: Neck supple. Carotid bruit is not present. No tracheal deviation present.  Cardiovascular: Normal rate, regular rhythm, normal heart sounds and intact distal pulses.  Exam reveals no gallop and no friction rub.   No murmur heard. No LE edema b/l. no calf TTP.     Pulmonary/Chest: Effort normal and breath sounds normal. No stridor. No respiratory distress. She has no wheezes. She has no rales.  Abdominal: Soft. Bowel sounds are normal. She exhibits distension. She exhibits no mass. There is no hepatomegaly. There is no tenderness. There is no rebound and no guarding.  Lymphadenopathy:    She has no cervical adenopathy.  Neurological: She is alert and oriented to person, place, and time. She has normal reflexes.  Skin: Skin is warm and dry. No rash noted.  Psychiatric: She has a normal mood and affect. Her behavior is normal. Judgment and thought content normal.     Labs reviewed: Appointment on 02/11/2016  Component Date Value Ref Range Status  . Glucose 02/11/2016 145* 65 - 99 mg/dL Final  . BUN 02/11/2016 12  8 - 27 mg/dL Final  . Creatinine, Ser 02/11/2016 0.71  0.57 - 1.00 mg/dL Final  . GFR calc non Af Amer 02/11/2016 85  >59 mL/min/1.73 Final  . GFR calc Af Amer 02/11/2016 98  >59 mL/min/1.73 Final  . BUN/Creatinine Ratio 02/11/2016 17  11 - 26 Final  . Sodium 02/11/2016 140  134 - 144 mmol/L Final  . Potassium 02/11/2016 4.2  3.5 - 5.2 mmol/L Final  . Chloride 02/11/2016 99  96 - 106 mmol/L Final  . CO2 02/11/2016 25  18 - 29 mmol/L Final  . Calcium 02/11/2016 9.6  8.7 - 10.3 mg/dL Final  . Total Protein 02/11/2016 7.2  6.0 - 8.5 g/dL Final  . Albumin 02/11/2016 4.2  3.5 - 4.8 g/dL Final  . Globulin, Total 02/11/2016 3.0  1.5 - 4.5 g/dL Final  . Albumin/Globulin Ratio 02/11/2016 1.4  1.1 - 2.5 Final   Comment: **Effective February 23, 2016 the reference interval**   for A/G Ratio will be changing to:              Age                Female          Female  0 -  7 days       1.1 - 2.3       1.1 - 2.3           8 - 30 days       1.2 - 2.8       1.2 - 2.8           1 -  6 months     1.3 - 3.6       1.3 - 3.6    7 months -  5 years      1.5 - 2.6       1.5 - 2.6              > 5 years      1.2 - 2.2       1.2 - 2.2   . Bilirubin  Total 02/11/2016 0.5  0.0 - 1.2 mg/dL Final  . Alkaline Phosphatase 02/11/2016 56  39 - 117 IU/L Final  . AST 02/11/2016 15  0 - 40 IU/L Final  . ALT 02/11/2016 16  0 - 32 IU/L Final  . Cholesterol, Total 02/11/2016 131  100 - 199 mg/dL Final  . Triglycerides 02/11/2016 52  0 - 149 mg/dL Final  . HDL 02/11/2016 54  >39 mg/dL Final  . VLDL Cholesterol Cal 02/11/2016 10  5 - 40 mg/dL Final  . LDL Calculated 02/11/2016 67  0 - 99 mg/dL Final  . Chol/HDL Ratio 02/11/2016 2.4  0.0 - 4.4 ratio units Final   Comment:                                   T. Chol/HDL Ratio                                             Men  Women                               1/2 Avg.Risk  3.4    3.3                                   Avg.Risk  5.0    4.4                                2X Avg.Risk  9.6    7.1                                3X Avg.Risk 23.4   11.0   . Hgb A1c MFr Bld 02/11/2016 6.5* 4.8 - 5.6 % Final   Comment:          Pre-diabetes: 5.7 - 6.4          Diabetes: >6.4          Glycemic control for adults with diabetes: <7.0   . Est. average glucose Bld gHb Est-m* 02/11/2016 140   Final  Scanned Document on 02/10/2016  Component Date Value Ref Range Status  . HM Diabetic Eye Exam 01/20/2016 No Retinopathy  No Retinopathy  Final    No results found.   Assessment/Plan   ICD-9-CM ICD-10-CM   1. Controlled type 2 diabetes mellitus without complication, without long-term current use of insulin (HCC) 250.00 XX123456 Basic Metabolic Panel     Hemoglobin A1c     ALT  2. Essential hypertension 401.9 [id number] Basic Metabolic Panel     ALT  3. Hyperlipidemia LDL goal <100 272.4 E78.5 Lipid Panel     ALT  4. Obesity (BMI 30-39.9) 278.00 XX123456 Basic Metabolic Panel  5. Extrinsic asthma, unspecified asthma severity, uncomplicated XX123456 [id number]   6. Seasonal allergic rhinitis due to pollen 477.0 J30.1    Continue current medications  Increase exercise as tolerated  Continue diet program  Follow up in 3  months for routine visit. Fasting labs prior to appt  [place] [address]. Perlie Gold  [facility] [address] [place], [place] [address] [phone] Cell (Monday-Friday 8 AM - 5 PM) [phone] After 5 PM and follow prompts

## 2016-03-11 IMAGING — MG MM SCREEN MAMMOGRAM BILATERAL
4 series · 4 of 4 positions shown · non-contrast
Comparison: Previous exam(s).

CLINICAL DATA: Screening.

EXAM:
DIGITAL SCREENING BILATERAL MAMMOGRAM WITH CAD

[R CC]
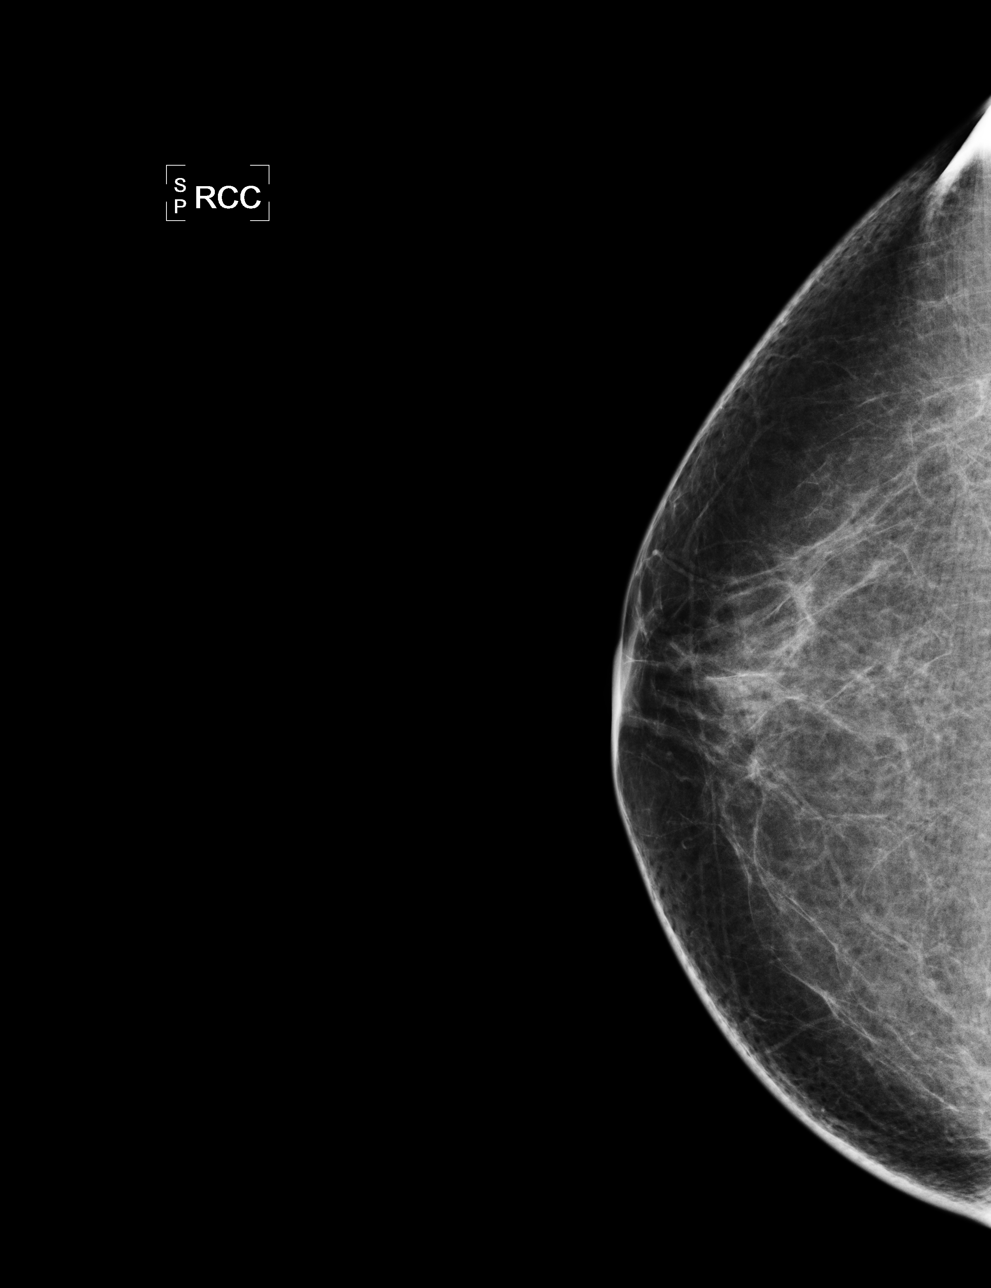

[L CC]
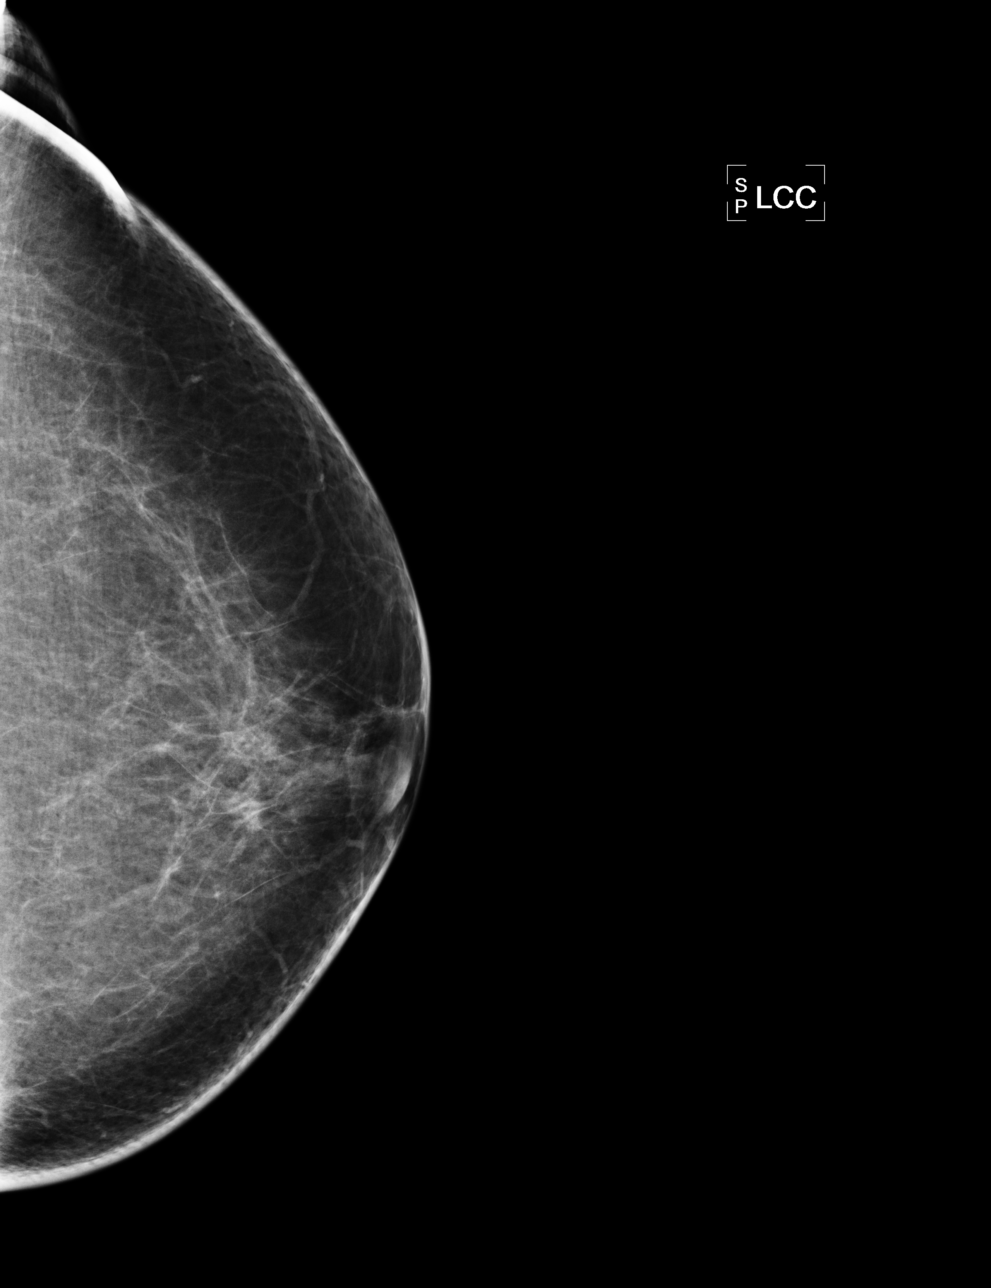

[L MLO]
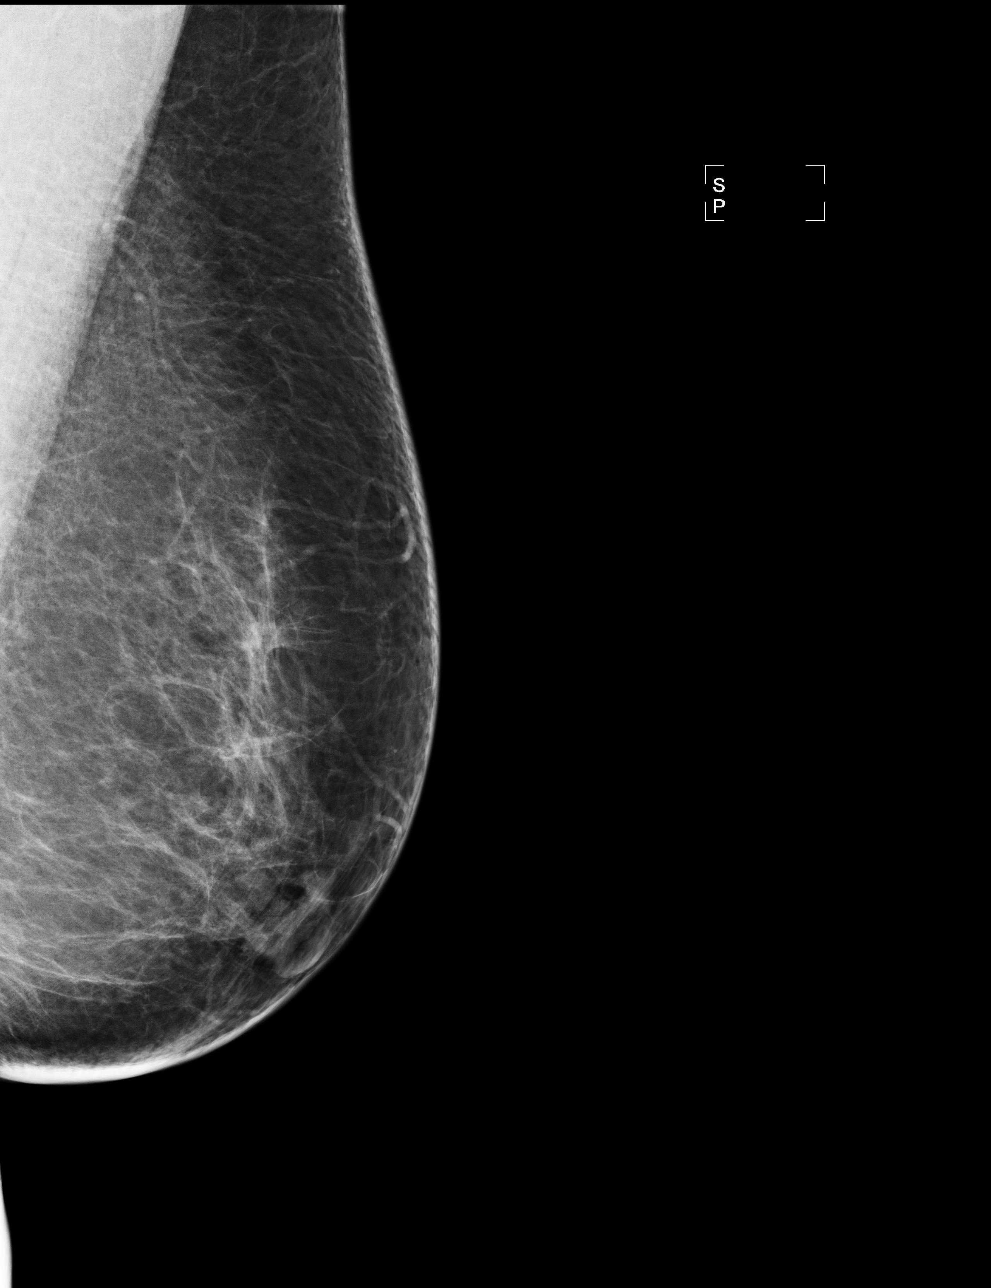

[R MLO]
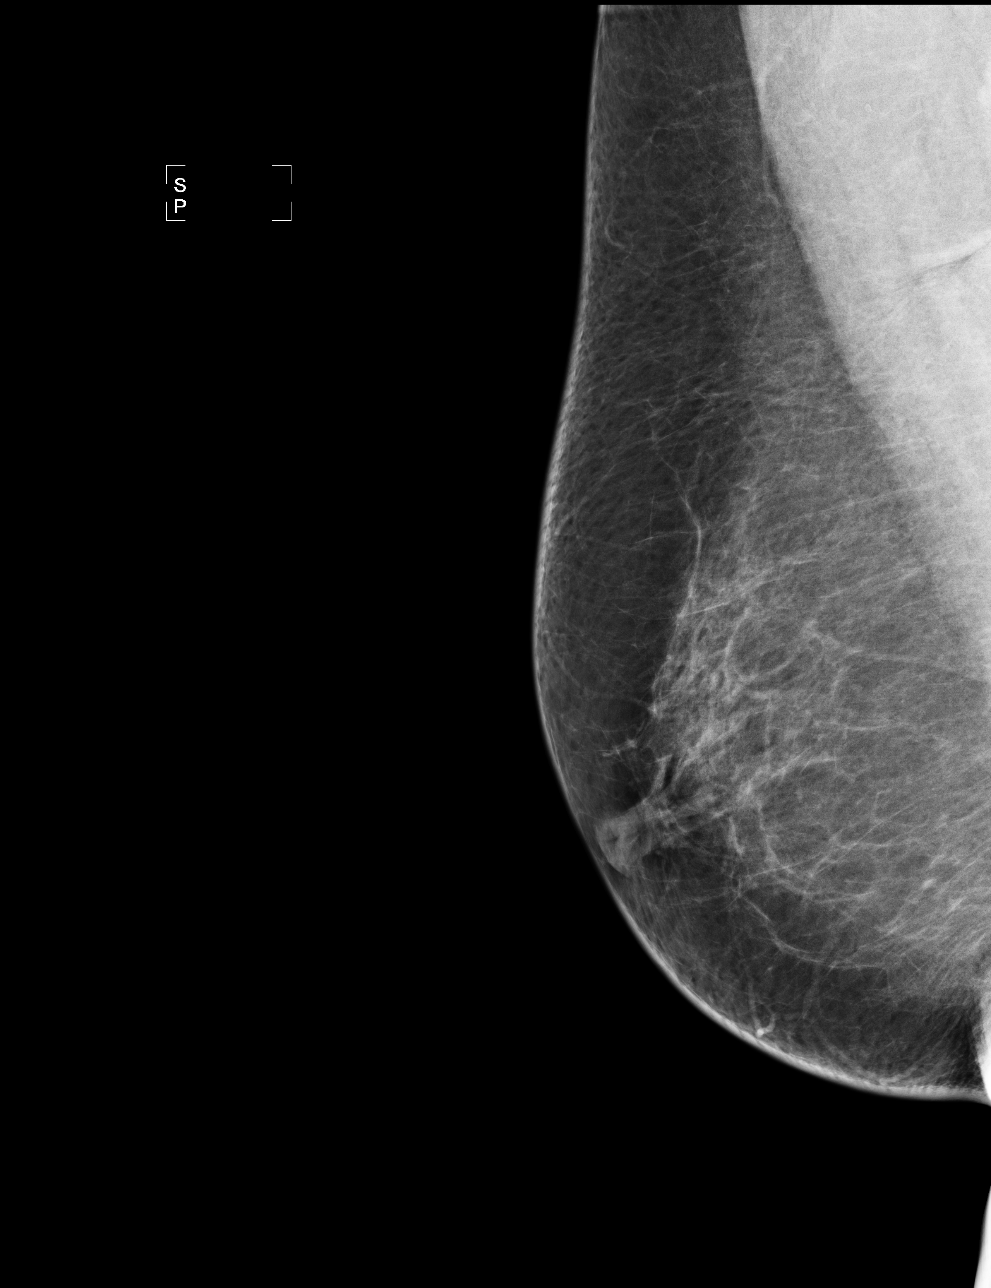

[4 of 4 positions shown; findings below may reference images not displayed]

ACR Breast Density Category b: There are scattered areas of
fibroglandular density.
FINDINGS: There are no findings suspicious for malignancy. Images were
processed with CAD.
IMPRESSION: No mammographic evidence of malignancy. A result letter of this
screening mammogram will be mailed directly to the patient.

RECOMMENDATION:
Screening mammogram in one year. (Code:AS-G-LCT)

BI-RADS CATEGORY  1: Negative.

## 2016-03-17 ENCOUNTER — Other Ambulatory Visit: Payer: Self-pay | Admitting: *Deleted

## 2016-03-17 MED ORDER — ROSUVASTATIN CALCIUM 20 MG PO TABS
ORAL_TABLET | ORAL | Status: DC
Start: 2016-03-17 — End: 2016-10-29

## 2016-03-17 NOTE — Telephone Encounter (Signed)
Patient requested to be sent to [facility].  

## 2016-04-28 ENCOUNTER — Other Ambulatory Visit: Payer: Self-pay

## 2016-04-28 DIAGNOSIS — I10 Essential (primary) hypertension: Secondary | ICD-10-CM

## 2016-04-28 MED ORDER — CARVEDILOL 25 MG PO TABS: ORAL_TABLET | ORAL | Status: DC

## 2016-04-28 NOTE — Telephone Encounter (Signed)
Patient aware rx sent to [facility]

## 2016-05-17 ENCOUNTER — Other Ambulatory Visit: Payer: Medicare Other

## 2016-05-17 DIAGNOSIS — E119 Type 2 diabetes mellitus without complications: Secondary | ICD-10-CM | POA: Diagnosis not present

## 2016-05-17 DIAGNOSIS — E669 Obesity, unspecified: Secondary | ICD-10-CM

## 2016-05-17 DIAGNOSIS — E785 Hyperlipidemia, unspecified: Secondary | ICD-10-CM | POA: Diagnosis not present

## 2016-05-17 DIAGNOSIS — I10 Essential (primary) hypertension: Secondary | ICD-10-CM | POA: Diagnosis not present

## 2016-05-19 ENCOUNTER — Ambulatory Visit (INDEPENDENT_AMBULATORY_CARE_PROVIDER_SITE_OTHER): Payer: Medicare Other | Admitting: Internal Medicine

## 2016-05-19 ENCOUNTER — Encounter: Payer: Self-pay | Admitting: Internal Medicine

## 2016-05-19 VITALS — BP 138/72 | HR 64 | Temp 98.0°F | Ht 61.0 in | Wt 160.6 lb

## 2016-05-19 DIAGNOSIS — E669 Obesity, unspecified: Secondary | ICD-10-CM

## 2016-05-19 DIAGNOSIS — R350 Frequency of micturition: Secondary | ICD-10-CM | POA: Diagnosis not present

## 2016-05-19 DIAGNOSIS — L821 Other seborrheic keratosis: Secondary | ICD-10-CM | POA: Diagnosis not present

## 2016-05-19 DIAGNOSIS — E785 Hyperlipidemia, unspecified: Secondary | ICD-10-CM

## 2016-05-19 DIAGNOSIS — I10 Essential (primary) hypertension: Secondary | ICD-10-CM | POA: Diagnosis not present

## 2016-05-19 DIAGNOSIS — K5900 Constipation, unspecified: Secondary | ICD-10-CM | POA: Diagnosis not present

## 2016-05-19 DIAGNOSIS — J301 Allergic rhinitis due to pollen: Secondary | ICD-10-CM

## 2016-05-19 DIAGNOSIS — E119 Type 2 diabetes mellitus without complications: Secondary | ICD-10-CM

## 2016-05-19 DIAGNOSIS — J45909 Unspecified asthma, uncomplicated: Secondary | ICD-10-CM

## 2016-05-19 DIAGNOSIS — Z23 Encounter for immunization: Secondary | ICD-10-CM | POA: Diagnosis not present

## 2016-05-19 MED ORDER — LOSARTAN POTASSIUM-HCTZ 100-25 MG PO TABS: ORAL_TABLET | ORAL | Status: DC

## 2016-05-19 MED ORDER — AMLODIPINE BESYLATE 5 MG PO TABS: 5.0000 mg | ORAL_TABLET | Freq: Every day | ORAL | Status: DC

## 2016-05-19 MED ORDER — EZETIMIBE 10 MG PO TABS
10.0000 mg | ORAL_TABLET | Freq: Every day | ORAL | Status: DC
Start: 2016-05-19 — End: 2016-09-13

## 2016-05-19 MED ORDER — CARVEDILOL 25 MG PO TABS: ORAL_TABLET | ORAL | Status: DC

## 2016-05-19 MED ORDER — METFORMIN HCL 500 MG PO TABS: 500.0000 mg | ORAL_TABLET | Freq: Every day | ORAL | Status: DC

## 2016-05-19 MED ORDER — BUDESONIDE-FORMOTEROL FUMARATE 160-4.5 MCG/ACT IN AERO: INHALATION_SPRAY | RESPIRATORY_TRACT | Status: DC

## 2016-05-19 MED ORDER — MOMETASONE FUROATE 50 MCG/ACT NA SUSP: NASAL | Status: DC

## 2016-05-19 NOTE — Patient Instructions (Addendum)
Pneumovax vaccine given today  Continue current medications as ordered  Will call with Derm referral  Follow up in 3 mos for CPE/ECG. Fasting labs prior to appt

## 2016-05-19 NOTE — Progress Notes (Signed)
Patient ID: Tanya Bell, female   DOB: 1941-06-15, 75 y.o.   MRN: [id number]    Location:  [facility] Place of Service: OFFICE  Chief Complaint  Patient presents with  . Medical Management of Chronic Issues    3 month follow up, patient states she has a wart near private area, think she may have UTI because she has back discomfort    HPI:  75 yo female seen today for f/u. She is c/a abnormal area in groin area 2 weeks ago. No pain or bleeding. No new sexual partners. She is not sexually active. She reports increased urinary frequency and lower back pain x 1 week. No hematuria, dysuria. She has lower abdominal pain.  She reports increased family stressors - grandson killed in motorcycle accident and spouse has been ill. She admits to not being compliant with meds.  DM - no low BS reactions on metformin. A1c 6.9%. BS at home fluctuating but believes related to meter. She has ariva meter. FBS 149 at lab draw 2 days ago  HTN - BP controlled on losartan HCT, amlodipine, coreg. She also takes ASA daily  Hyperlipidemia - no myalgias. Takes crestor 1/2 tab on MWF due to previous rxn of myalgias and daily zetia. LDL 170 with Total chol 245. She has missed several doses of crestor  Seasonal allergy/asthma - no exacerbations. Last PFTs several years ago in [place]. Stable on nasonex. She uses spiriva, symbicort, proventil hfa  Vaginal dryness - stable on premarin cream  GERD - stable on zantac. Takes align. She did not like the way levsin made her feel (tongue felt numb) and she stopped taking it   Skin lesions - she saw derm in Jan 2017 and dx with SK, seborrhea dermatitis and cherry angioma. She was rx ketoconazole 2% crm to apply to cheek area which has helped.   She needs pneumovax. She has had prevnar already. Declines zostavax   Past Medical History  Diagnosis Date  . Herpes simplex with other ophthalmic complications   . Disorder of bone and cartilage, unspecified   . Reflux esophagitis    . Candidiasis of mouth   . Other cataract   . Other cataract   . Acute upper respiratory infections of unspecified site   . Essential hypertension, benign   . Thrombocytopenia, unspecified ([place])   . Other dystrophy of vulva   . Diverticulitis of colon (without mention of hemorrhage)   . Essential hypertension, benign   . Unspecified disorder of skin and subcutaneous tissue   . Rash and other nonspecific skin eruption   . Type II or unspecified type diabetes mellitus without mention of complication, not stated as uncontrolled   . Other and unspecified hyperlipidemia   . Unspecified essential hypertension   . Acute upper respiratory infections of unspecified site   . Type II or unspecified type diabetes mellitus without mention of complication, not stated as uncontrolled   . Other and unspecified hyperlipidemia   . Other and unspecified hyperlipidemia   . Unspecified essential hypertension   . Acute upper respiratory infections of unspecified site   . Allergic rhinitis due to pollen   . Extrinsic asthma, unspecified   . Other malaise and fatigue   . Other abnormal glucose     Past Surgical History  Procedure Laterality Date  . Cesarean section      [phone]  . Cyst excision  1974    Pilonidal Cyst excision  . Knee arthroscopy Right     for  meniscal tear  . Abdominal hysterectomy      Patient Care Team: Gildardo Cranker, DO as PCP - General (Internal Medicine) Teena Irani, MD as Consulting Physician (Gastroenterology) Warden Fillers, MD as Consulting Physician (Ophthalmology) Hurman Horn, MD as Consulting Physician (Ophthalmology) Mickel Fuchs, MD as Referring Physician (Psychiatry)  Social History   Social History  . Marital Status: Unknown    Spouse Name: N/A  . Number of Children: N/A  . Years of Education: N/A   Occupational History  . Not on file.   Social History Main Topics  . Smoking status: Former Smoker    Types: Cigarettes    Quit date:  12/13/1990  . Smokeless tobacco: Never Used  . Alcohol Use: No  . Drug Use: No  . Sexual Activity:    Partners: Male     Comment: None    Other Topics Concern  . Not on file   Social History Narrative     reports that she quit smoking about 25 years ago. Her smoking use included Cigarettes. She has never used smokeless tobacco. She reports that she does not drink alcohol or use illicit drugs.  Family History  Problem Relation Age of Onset  . Cancer Mother     brain  . Stroke Father    Family Status  Relation Status Death Age  . Mother Deceased   . Father Deceased   . Sister Alive   . Brother Alive   . Daughter Alive   . Son Alive   . Daughter Alive   . Daughter Alive      Allergies  Allergen Reactions  . Sulfa Antibiotics     Medications: Patient's Medications  New Prescriptions   No medications on file  Previous Medications   ACETAMINOPHEN (TYLENOL) 325 MG TABLET    Take 325 mg by mouth daily as needed. For pain   ALBUTEROL (PROVENTIL HFA;VENTOLIN HFA) 108 (90 BASE) MCG/ACT INHALER    Inhale 2 puffs into the lungs every 6 (six) hours as needed for wheezing or shortness of breath.   AMBULATORY NON FORMULARY MEDICATION    Compression stockings: to both legs when out of bed, please remove before going to bed at night 1 pair   AMLODIPINE (NORVASC) 5 MG TABLET    Take 1 tablet (5 mg total) by mouth daily.   ASPIRIN 81 MG TABLET    Take 81 mg by mouth daily.   BIFIDOBACTERIUM INFANTIS (ALIGN) CAPSULE    Take one tablet once a day   BUDESONIDE-FORMOTEROL (SYMBICORT) 160-4.5 MCG/ACT INHALER    Inhale two puffs into the lungs twice daily    Member No. [id number]   CALCIUM-VITAMIN D (OSCAL WITH D) 500-200 MG-UNIT PER TABLET    Take 1 tablet by mouth 2 (two) times daily.   CARVEDILOL (COREG) 25 MG TABLET    Take one tablet two times a day for blood pressure   CONJUGATED ESTROGENS (PREMARIN) VAGINAL CREAM    Place vaginally daily.   EZETIMIBE (ZETIA) 10 MG TABLET     Take 1 tablet (10 mg total) by mouth daily.   FEXOFENADINE (ALLEGRA) 180 MG TABLET    Take 180 mg by mouth daily.   HYDROCORTISONE (ANUSOL-HC) 2.5 % RECTAL CREAM    Place 1 application rectally 2 (two) times daily as needed for hemorrhoids or itching.   LOSARTAN-HYDROCHLOROTHIAZIDE (HYZAAR) 100-25 MG PER TABLET    TAKE 1 TABLET ONCE A DAY FOR BLOOD PRESSURE   METFORMIN (GLUCOPHAGE) 500 MG TABLET  Take 1 tablet (500 mg total) by mouth daily with breakfast.   MOMETASONE (NASONEX) 50 MCG/ACT NASAL SPRAY    One spray in each nostril twice daily as needed for allergies.   MULTIPLE VITAMIN (MULTIVITAMIN) TABLET    Take 1 tablet by mouth daily.   RANITIDINE (ZANTAC) 150 MG TABLET    Take 150 mg by mouth. Take one tablet by mouth daily in the morning.   ROSUVASTATIN (CRESTOR) 20 MG TABLET    Take one tablet three times a week, Monday, Wednesday and Friday   TIOTROPIUM (SPIRIVA) 18 MCG INHALATION CAPSULE    Place 1 capsule (18 mcg total) into inhaler and inhale daily.   VITAMIN C (ASCORBIC ACID) 500 MG TABLET    Take 500 mg by mouth daily.  Modified Medications   No medications on file  Discontinued Medications   No medications on file    Review of Systems  Constitutional: Negative for fever, chills, diaphoresis, activity change, appetite change and fatigue.  HENT: Negative for ear pain and sore throat.   Eyes: Negative for visual disturbance.  Respiratory: Negative for cough, chest tightness and shortness of breath.   Cardiovascular: Negative for chest pain, palpitations and leg swelling.  Gastrointestinal: Negative for nausea, vomiting, abdominal pain, diarrhea, constipation and blood in stool.  Genitourinary: Negative for dysuria.  Musculoskeletal: Negative for myalgias and arthralgias.  Skin: Negative for rash.  Neurological: Negative for dizziness, tremors, numbness and headaches.  Psychiatric/Behavioral: Negative for sleep disturbance. The patient is not nervous/anxious.     Filed  Vitals:   05/19/16 0851  BP: 138/72  Pulse: 64  Temp: 98 F (36.7 C)  TempSrc: Oral  Height: 5\' 1"  (1.549 m)  Weight: 160 lb 9.6 oz (72.848 kg)  SpO2: 90%   Body mass index is 30.36 kg/(m^2).  Physical Exam  Constitutional: She is oriented to person, place, and time. She appears well-developed and well-nourished.  HENT:  Mouth/Throat: Oropharynx is clear and moist. No oropharyngeal exudate.  Eyes: Pupils are equal, round, and reactive to light. No scleral icterus.  Neck: Neck supple. Carotid bruit is not present. No tracheal deviation present.  Cardiovascular: Normal rate, regular rhythm, normal heart sounds and intact distal pulses.  Exam reveals no gallop and no friction rub.   No murmur heard. No LE edema b/l. no calf TTP.   Pulmonary/Chest: Effort normal and breath sounds normal. No stridor. No respiratory distress. She has no wheezes. She has no rales.  Abdominal: Soft. Bowel sounds are normal. She exhibits no distension and no mass. There is no hepatomegaly. There is tenderness (lower quadrant). There is no rebound and no guarding.  Musculoskeletal: She exhibits edema.  Lymphadenopathy:    She has no cervical adenopathy.  Neurological: She is alert and oriented to person, place, and time.  Skin: Skin is warm and dry. No rash noted.     Psychiatric: She has a normal mood and affect. Her behavior is normal. Judgment and thought content normal.     Labs reviewed: Appointment on 05/17/2016  Component Date Value Ref Range Status  . Glucose 05/17/2016 149* 65 - 99 mg/dL Final  . BUN 05/17/2016 10  8 - 27 mg/dL Final  . Creatinine, Ser 05/17/2016 0.65  0.57 - 1.00 mg/dL Final  . GFR calc non Af Amer 05/17/2016 88  >59 mL/min/1.73 Final  . GFR calc Af Amer 05/17/2016 101  >59 mL/min/1.73 Final  . BUN/Creatinine Ratio 05/17/2016 15  12 - 28 Final  . Sodium 05/17/2016 141  134 - 144 mmol/L Final  . Potassium 05/17/2016 3.8  3.5 - 5.2 mmol/L Final  . Chloride 05/17/2016 102   96 - 106 mmol/L Final  . CO2 05/17/2016 24  18 - 29 mmol/L Final  . Calcium 05/17/2016 9.3  8.7 - 10.3 mg/dL Final  . Cholesterol, Total 05/17/2016 245* 100 - 199 mg/dL Final  . Triglycerides 05/17/2016 66  0 - 149 mg/dL Final  . HDL 05/17/2016 62  >39 mg/dL Final  . VLDL Cholesterol Cal 05/17/2016 13  5 - 40 mg/dL Final  . LDL Calculated 05/17/2016 170* 0 - 99 mg/dL Final  . Chol/HDL Ratio 05/17/2016 4.0  0.0 - 4.4 ratio units Final   Comment:                                   T. Chol/HDL Ratio                                             Men  Women                               1/2 Avg.Risk  3.4    3.3                                   Avg.Risk  5.0    4.4                                2X Avg.Risk  9.6    7.1                                3X Avg.Risk 23.4   11.0   . Hgb A1c MFr Bld 05/17/2016 6.9* 4.8 - 5.6 % Final   Comment:          Pre-diabetes: 5.7 - 6.4          Diabetes: >6.4          Glycemic control for adults with diabetes: <7.0   . Est. average glucose Bld gHb Est-m* 05/17/2016 151   Final  . ALT 05/17/2016 12  0 - 32 IU/L Final    No results found.   Assessment/Plan   ICD-9-CM ICD-10-CM   1. Seborrheic keratoses 702.19 L82.1 Ambulatory referral to Dermatology   on pubic mons, pedunculated and fleshy  2. Constipation, unspecified constipation type 564.00 K59.00   3. Increased urinary frequency 788.41 R35.0 Culture, Urine  4. Hyperlipidemia LDL goal <100 272.4 E78.5 Lipid Panel     TSH  5. Controlled type 2 diabetes mellitus without complication, without long-term current use of insulin (HCC) 250.00 E11.9 Pneumococcal polysaccharide vaccine 23-valent greater than or equal to 2yo subcutaneous/IM     CMP     Hemoglobin A1c     Microalbumin/Creatinine Ratio, Urine  6. Essential hypertension 401.9 I10 CBC with Differential/Platelets  7. Extrinsic asthma, unspecified asthma severity, uncomplicated [id number]   8. Seasonal allergic rhinitis due to pollen 477.0  J30.1   9. Obesity (BMI 30-39.9) 278.00 E66.9   10. Need for 23-polyvalent pneumococcal  polysaccharide vaccine V03.82 Z23 Pneumococcal polysaccharide vaccine 23-valent greater than or equal to 2yo subcutaneous/IM    Pneumovax vaccine given today  Continue current medications as ordered  Will call with Derm referral  Follow up in 3 mos for CPE/ECG. Fasting labs prior to appt  Media S. Perlie Gold  [facility] [address] [place], [place] [address] [phone] Cell (Monday-Friday 8 AM - 5 PM) [phone] After 5 PM and follow prompts

## 2016-05-19 NOTE — Addendum Note (Signed)
Addended by: Moshe Cipro, MESHELL A on: 05/19/2016 11:05 AM   Modules accepted: Orders

## 2016-05-19 NOTE — Addendum Note (Signed)
Addended by: Moshe Cipro Keilen Kahl A on: 05/19/2016 01:52 PM   Modules accepted: Orders

## 2016-05-20 LAB — URINE CULTURE

## 2016-06-03 DIAGNOSIS — L918 Other hypertrophic disorders of the skin: Secondary | ICD-10-CM | POA: Diagnosis not present

## 2016-06-03 DIAGNOSIS — D485 Neoplasm of uncertain behavior of skin: Secondary | ICD-10-CM | POA: Diagnosis not present

## 2016-06-03 DIAGNOSIS — L820 Inflamed seborrheic keratosis: Secondary | ICD-10-CM | POA: Diagnosis not present

## 2016-06-04 ENCOUNTER — Encounter: Payer: Self-pay | Admitting: Internal Medicine

## 2016-06-23 LAB — ALT: ALT: 12 IU/L (ref 0–32)

## 2016-06-23 LAB — LIPID PANEL
Chol/HDL Ratio: 4 ratio units (ref 0.0–4.4)
Cholesterol, Total: 245 mg/dL — ABNORMAL HIGH (ref 100–199)
HDL: 62 mg/dL (ref >39)
LDL Calculated: 170 mg/dL — ABNORMAL HIGH (ref 0–99)
TRIGLYCERIDES: 66 mg/dL (ref 0–149)
VLDL Cholesterol Cal: 13 mg/dL (ref 5–40)

## 2016-06-23 LAB — BASIC METABOLIC PANEL
BUN/Creatinine Ratio: 15 (ref 12–28)
BUN: 10 mg/dL (ref 8–27)
CALCIUM: 9.3 mg/dL (ref 8.7–10.3)
CHLORIDE: 102 mmol/L (ref 96–106)
CO2: 24 mmol/L (ref 18–29)
Creatinine, Ser: 0.65 mg/dL (ref 0.57–1.00)
GFR calc non Af Amer: 88 mL/min/{1.73_m2} (ref >59)
GFR, EST AFRICAN AMERICAN: 101 mL/min/{1.73_m2} (ref >59)
Glucose: 149 mg/dL — ABNORMAL HIGH (ref 65–99)
POTASSIUM: 3.8 mmol/L (ref 3.5–5.2)
Sodium: 141 mmol/L (ref 134–144)

## 2016-06-23 LAB — HEMOGLOBIN A1C
Est. average glucose Bld gHb Est-mCnc: 151 mg/dL
HEMOGLOBIN A1C: 6.9 % — AB (ref 4.8–5.6)

## 2016-07-14 ENCOUNTER — Encounter (HOSPITAL_COMMUNITY): Payer: Medicare Other

## 2016-07-14 ENCOUNTER — Ambulatory Visit: Payer: Medicare Other | Admitting: Family

## 2016-07-14 DIAGNOSIS — H43821 Vitreomacular adhesion, right eye: Secondary | ICD-10-CM | POA: Diagnosis not present

## 2016-07-14 DIAGNOSIS — E119 Type 2 diabetes mellitus without complications: Secondary | ICD-10-CM | POA: Diagnosis not present

## 2016-07-14 DIAGNOSIS — H43822 Vitreomacular adhesion, left eye: Secondary | ICD-10-CM | POA: Diagnosis not present

## 2016-07-14 DIAGNOSIS — H35349 Macular cyst, hole, or pseudohole, unspecified eye: Secondary | ICD-10-CM | POA: Diagnosis not present

## 2016-07-30 NOTE — Addendum Note (Signed)
Addended by: Moshe Cipro Chayce Rullo A on: 07/30/2016 02:32 PM   Modules accepted: Orders

## 2016-08-18 ENCOUNTER — Other Ambulatory Visit: Payer: Medicare Other

## 2016-08-20 ENCOUNTER — Encounter: Payer: Medicare Other | Admitting: Internal Medicine

## 2016-08-27 ENCOUNTER — Encounter: Payer: Medicare Other | Admitting: Internal Medicine

## 2016-09-13 ENCOUNTER — Other Ambulatory Visit: Payer: Self-pay

## 2016-09-13 DIAGNOSIS — J45909 Unspecified asthma, uncomplicated: Secondary | ICD-10-CM

## 2016-09-13 MED ORDER — BUDESONIDE-FORMOTEROL FUMARATE 160-4.5 MCG/ACT IN AERO: INHALATION_SPRAY | RESPIRATORY_TRACT | 3 refills | Status: DC

## 2016-09-13 MED ORDER — TIOTROPIUM BROMIDE MONOHYDRATE 18 MCG IN CAPS: 18.0000 ug | ORAL_CAPSULE | Freq: Every day | RESPIRATORY_TRACT | 3 refills | Status: DC

## 2016-09-13 MED ORDER — EZETIMIBE 10 MG PO TABS: 10.0000 mg | ORAL_TABLET | Freq: Every day | ORAL | 1 refills | Status: DC

## 2016-09-13 NOTE — Telephone Encounter (Signed)
Message left on triage voicemail- patient needs refills on Symbicort and Zetia. [facility]   [facility] sent, left message informing patient

## 2016-09-30 ENCOUNTER — Telehealth: Payer: Self-pay | Admitting: Internal Medicine

## 2016-09-30 NOTE — Telephone Encounter (Signed)
left msg asking if pt can come at 8:45 on 11/17 for AWV w/ nurse before seeing doctor. VDM (dee-dee)

## 2016-10-04 ENCOUNTER — Ambulatory Visit (INDEPENDENT_AMBULATORY_CARE_PROVIDER_SITE_OTHER): Payer: Medicare Other

## 2016-10-04 DIAGNOSIS — Z23 Encounter for immunization: Secondary | ICD-10-CM | POA: Diagnosis not present

## 2016-10-14 ENCOUNTER — Telehealth: Payer: Self-pay

## 2016-10-14 NOTE — Telephone Encounter (Signed)
Message left on triage voicemail: [facility] will no longer supply diabetic supplies after November 2017. Patient would like a call to discuss.   Spoke with patient, patient informed she can get supplies through her local or mail order pharmacy, otherwise patient will need to call her insurance company to see whom they cover specifically for DM supplies.  Patient states she will call her insurance company and call back to give the name of the company she would like to use

## 2016-10-25 ENCOUNTER — Other Ambulatory Visit: Payer: Self-pay

## 2016-10-25 DIAGNOSIS — I10 Essential (primary) hypertension: Secondary | ICD-10-CM

## 2016-10-25 DIAGNOSIS — E785 Hyperlipidemia, unspecified: Secondary | ICD-10-CM

## 2016-10-25 DIAGNOSIS — E119 Type 2 diabetes mellitus without complications: Secondary | ICD-10-CM

## 2016-10-26 ENCOUNTER — Other Ambulatory Visit: Payer: Medicare Other

## 2016-10-26 ENCOUNTER — Other Ambulatory Visit: Payer: Self-pay | Admitting: Internal Medicine

## 2016-10-26 DIAGNOSIS — E119 Type 2 diabetes mellitus without complications: Secondary | ICD-10-CM | POA: Diagnosis not present

## 2016-10-26 DIAGNOSIS — E785 Hyperlipidemia, unspecified: Secondary | ICD-10-CM | POA: Diagnosis not present

## 2016-10-26 DIAGNOSIS — I10 Essential (primary) hypertension: Secondary | ICD-10-CM | POA: Diagnosis not present

## 2016-10-26 LAB — CBC WITH DIFFERENTIAL/PLATELET
BASOS ABS: 75 {cells}/uL (ref 0–200)
Basophils Relative: 1 %
EOS ABS: 150 {cells}/uL (ref 15–500)
Eosinophils Relative: 2 %
HEMATOCRIT: 39.4 % (ref 35.0–45.0)
HEMOGLOBIN: 13 g/dL (ref 11.7–15.5)
LYMPHS ABS: 3900 {cells}/uL (ref 850–3900)
Lymphocytes Relative: 52 %
MCH: 25.8 pg — AB (ref 27.0–33.0)
MCHC: 33 g/dL (ref 32.0–36.0)
MCV: 78.2 fL — ABNORMAL LOW (ref 80.0–100.0)
MONO ABS: 750 {cells}/uL (ref 200–950)
MPV: 10.8 fL (ref 7.5–12.5)
Monocytes Relative: 10 %
NEUTROS ABS: 2625 {cells}/uL (ref 1500–7800)
NEUTROS PCT: 35 %
Platelets: 198 10*3/uL (ref 140–400)
RBC: 5.04 MIL/uL (ref 3.80–5.10)
RDW: 16.3 % — ABNORMAL HIGH (ref 11.0–15.0)
WBC: 7.5 10*3/uL (ref 3.8–10.8)

## 2016-10-26 LAB — COMPLETE METABOLIC PANEL WITH GFR
ALBUMIN: 3.8 g/dL (ref 3.6–5.1)
ALK PHOS: 55 U/L (ref 33–130)
ALT: 12 U/L (ref 6–29)
AST: 13 U/L (ref 10–35)
BILIRUBIN TOTAL: 0.5 mg/dL (ref 0.2–1.2)
BUN: 11 mg/dL (ref 7–25)
CALCIUM: 9.6 mg/dL (ref 8.6–10.4)
CO2: 31 mmol/L (ref 20–31)
CREATININE: 0.68 mg/dL (ref 0.60–0.93)
Chloride: 103 mmol/L (ref 98–110)
GFR, Est African American: >89 mL/min (ref >=60)
GFR, Est Non African American: 86 mL/min (ref >=60)
GLUCOSE: 139 mg/dL — AB (ref 65–99)
Potassium: 4.7 mmol/L (ref 3.5–5.3)
SODIUM: 140 mmol/L (ref 135–146)
TOTAL PROTEIN: 6.9 g/dL (ref 6.1–8.1)

## 2016-10-26 LAB — TSH: TSH: 2.12 mIU/L

## 2016-10-27 LAB — MICROALBUMIN / CREATININE URINE RATIO
CREATININE, URINE: 267 mg/dL (ref 20–320)
Microalb Creat Ratio: 8 mcg/mg creat (ref <30)
Microalb, Ur: 2.1 mg/dL

## 2016-10-27 LAB — HEMOGLOBIN A1C
HEMOGLOBIN A1C: 6.7 % — AB (ref <5.7)
Mean Plasma Glucose: 146 mg/dL

## 2016-10-29 ENCOUNTER — Ambulatory Visit (INDEPENDENT_AMBULATORY_CARE_PROVIDER_SITE_OTHER): Payer: Medicare Other

## 2016-10-29 ENCOUNTER — Ambulatory Visit (INDEPENDENT_AMBULATORY_CARE_PROVIDER_SITE_OTHER): Payer: Medicare Other | Admitting: Internal Medicine

## 2016-10-29 ENCOUNTER — Encounter: Payer: Self-pay | Admitting: Internal Medicine

## 2016-10-29 VITALS — BP 154/80 | HR 62 | Temp 98.2°F | Ht 61.0 in | Wt 162.4 lb

## 2016-10-29 DIAGNOSIS — J45909 Unspecified asthma, uncomplicated: Secondary | ICD-10-CM

## 2016-10-29 DIAGNOSIS — Z0000 Encounter for general adult medical examination without abnormal findings: Secondary | ICD-10-CM

## 2016-10-29 DIAGNOSIS — J301 Allergic rhinitis due to pollen: Secondary | ICD-10-CM | POA: Diagnosis not present

## 2016-10-29 DIAGNOSIS — E785 Hyperlipidemia, unspecified: Secondary | ICD-10-CM | POA: Diagnosis not present

## 2016-10-29 DIAGNOSIS — N951 Menopausal and female climacteric states: Secondary | ICD-10-CM

## 2016-10-29 DIAGNOSIS — I10 Essential (primary) hypertension: Secondary | ICD-10-CM

## 2016-10-29 DIAGNOSIS — J019 Acute sinusitis, unspecified: Secondary | ICD-10-CM | POA: Diagnosis not present

## 2016-10-29 DIAGNOSIS — E119 Type 2 diabetes mellitus without complications: Secondary | ICD-10-CM

## 2016-10-29 LAB — LIPID PANEL
CHOL/HDL RATIO: 3.2 ratio (ref <5.0)
CHOLESTEROL: 173 mg/dL (ref <200)
HDL: 54 mg/dL (ref >50)
LDL Cholesterol: 106 mg/dL — ABNORMAL HIGH (ref <100)
TRIGLYCERIDES: 64 mg/dL (ref <150)
VLDL: 13 mg/dL (ref <30)

## 2016-10-29 MED ORDER — ALBUTEROL SULFATE HFA 108 (90 BASE) MCG/ACT IN AERS: 2.0000 | INHALATION_SPRAY | Freq: Four times a day (QID) | RESPIRATORY_TRACT | 3 refills | Status: DC | PRN

## 2016-10-29 MED ORDER — METFORMIN HCL 500 MG PO TABS: 500.0000 mg | ORAL_TABLET | Freq: Every day | ORAL | 3 refills | Status: DC

## 2016-10-29 MED ORDER — AMOXICILLIN 500 MG PO CAPS: 500.0000 mg | ORAL_CAPSULE | Freq: Two times a day (BID) | ORAL | 0 refills | Status: DC

## 2016-10-29 MED ORDER — ESTROGENS, CONJUGATED 0.625 MG/GM VA CREA: TOPICAL_CREAM | Freq: Every day | VAGINAL | 12 refills | Status: DC

## 2016-10-29 MED ORDER — CARVEDILOL 25 MG PO TABS: ORAL_TABLET | ORAL | 3 refills | Status: DC

## 2016-10-29 MED ORDER — ROSUVASTATIN CALCIUM 20 MG PO TABS: ORAL_TABLET | ORAL | 3 refills | Status: DC

## 2016-10-29 MED ORDER — MOMETASONE FUROATE 50 MCG/ACT NA SUSP: NASAL | 0 refills | Status: DC

## 2016-10-29 MED ORDER — LOSARTAN POTASSIUM-HCTZ 100-25 MG PO TABS: ORAL_TABLET | ORAL | 3 refills | Status: DC

## 2016-10-29 MED ORDER — TIOTROPIUM BROMIDE MONOHYDRATE 18 MCG IN CAPS: 18.0000 ug | ORAL_CAPSULE | Freq: Every day | RESPIRATORY_TRACT | 3 refills | Status: DC

## 2016-10-29 MED ORDER — EZETIMIBE 10 MG PO TABS: 10.0000 mg | ORAL_TABLET | Freq: Every day | ORAL | 3 refills | Status: DC

## 2016-10-29 MED ORDER — BUDESONIDE-FORMOTEROL FUMARATE 160-4.5 MCG/ACT IN AERO: INHALATION_SPRAY | RESPIRATORY_TRACT | 3 refills | Status: DC

## 2016-10-29 NOTE — Patient Instructions (Signed)
Encouraged pt to exercise 30-45 minutes 4-5 times per week. Eat a well balanced diet. Avoid smoking. Limit alcohol intake. Wear seatbelt when riding in the car. Wear sun block (SPF >50) when spending extended times outside.  Take amoxil 2 times daily x 10 days  Take probiotic daily while on antibiotic  Use saline nasal spray as needed to keep nose moist  Use nasonex daily as directed  Continue other medications as ordered  Follow up in 3 mos for routine visit. Fasting labs prior to appt

## 2016-10-29 NOTE — Progress Notes (Signed)
Quick Notes   Health Maintenance:   UTD on vaccines, except Shingles. Pt declined d/t OOP expenses (will call insurance again to verify). Will call to get records on Pt's Eye exam and Colonoscopy. She is due for a foot exam and hearing screen.    Abnormal Screen:  None, MMSE- 28/30, Passed Clock Test   Patient Concerns:  Pt has had a cold x 2 weeks, would like a recommended ENT. She would also like to discuss protein shakes and Almased (weight loss drug).     Nurse Concerns:  None

## 2016-10-29 NOTE — Progress Notes (Signed)
Patient ID: Tanya Bell, female   DOB: [birth year], 75 y.o.   MRN: [id number]   Location:  [facility]  Place of Service:  OFFICE  Provider: Arletha Grippe, DO  Patient Care Team: Gildardo Cranker, DO as PCP - General (Internal Medicine) Teena Irani, MD as Consulting Physician (Gastroenterology) Warden Fillers, MD as Consulting Physician (Ophthalmology) Hurman Horn, MD as Consulting Physician (Ophthalmology) Mickel Fuchs, MD as Referring Physician (Psychiatry)  Extended Emergency Contact Information Primary Emergency Contact: [place] Phone: [phone] Relation: None  Code Status:  FULL CODE Goals of Care: Advanced Directive information Advanced Directives 10/29/2016  Does patient have an advance directive? No  Does patient want to make changes to advanced directive? -  Would patient like information on creating an advanced directive? Yes - Environmental education officer Complaint  Patient presents with  . Annual Exam    HPI: Patient is a 75 y.o. female seen in today for annual wellness exam.  She reports sinus congestion not relieved with coricidan HBP and robitussin. She has tried chloraseptic spray also. She reports sore throat, HA, nasal congestion, ear aching, bloody nasal mucous, post nasal drip, productive cough x 1.5 - 2 weeks. No sick contacts.  DM - no low BS reactions on metformin. A1c 6.7%. BS at home fluctuating but believes related to meter. She has ariva meter. Urine microalbumin/Cr ratio 8  HTN - BP controlled on losartan HCT, amlodipine, coreg. She also takes ASA daily  Hyperlipidemia - no myalgias. Takes crestor 1/2 tab on MWF due to previous rxn of myalgias and daily zetia. LDL 170 with Total chol 245. She has missed several doses of crestor  Seasonal allergy/asthma - no exacerbations. Last PFTs several years ago in [place]. Stable on nasonex. She uses spiriva, symbicort, proventil hfa  Vaginal dryness - stable on premarin cream  GERD -  stable on zantac. Takes align. She did not like the way levsin made her feel (tongue felt numb) and she stopped taking it   Skin lesions - she saw derm in Jan 2017 and dx with SK, seborrhea dermatitis and cherry angioma. She was rx ketoconazole 2% crm to apply to cheek area which has helped.   She is UTD on vaccinations. declines zostavax   Depression screen [facility] 2/9 10/29/2016 02/04/2015 09/25/2014 09/04/2014 05/07/2014  Decreased Interest 0 0 0 0 0  Down, Depressed, Hopeless 0 0 0 0 0  PHQ - 2 Score 0 0 0 0 0    Fall Risk  10/29/2016 05/19/2016 11/12/2015 08/08/2015 02/04/2015  Falls in the past year? No No No No No   MMSE - Mini Mental State Exam 10/29/2016 09/04/2014  Orientation to time 5 5  Orientation to Place 5 5  Registration 3 3  Attention/ Calculation 5 3  Recall 2 1  Language- name 2 objects 2 2  Language- repeat 1 1  Language- follow 3 step command 2 2  Language- read & follow direction 1 1  Write a sentence 1 1  Copy design 1 1  Total score 28 25     Health Maintenance  Topic Date Due  . FOOT EXAM  05/06/2016  . ZOSTAVAX  12/12/2018 (Originally 04/13/2002)  . OPHTHALMOLOGY EXAM  01/19/2017  . HEMOGLOBIN A1C  04/25/2017  . MAMMOGRAM  12/16/2017  . TETANUS/TDAP  02/16/2022  . COLONOSCOPY  12/21/2025  . INFLUENZA VACCINE  Completed  . DEXA SCAN  Completed  . PNA vac Low Risk Adult  Completed  Functional Status Survey: Is the patient deaf or have difficulty hearing?: No Does the patient have difficulty seeing, even when wearing glasses/contacts?: No Does the patient have difficulty concentrating, remembering, or making decisions?: No Does the patient have difficulty walking or climbing stairs?: No Does the patient have difficulty dressing or bathing?: No Does the patient have difficulty doing errands alone such as visiting a doctor's office or shopping?: No  No exam data present   Past Medical History:  Diagnosis Date  . Acute upper respiratory infections  of unspecified site   . Acute upper respiratory infections of unspecified site   . Acute upper respiratory infections of unspecified site   . Allergic rhinitis due to pollen   . Candidiasis of mouth   . Disorder of bone and cartilage, unspecified   . Diverticulitis of colon (without mention of hemorrhage)(562.11)   . Essential hypertension, benign   . Essential hypertension, benign   . Extrinsic asthma, unspecified   . Herpes simplex with other ophthalmic complications   . Other abnormal glucose   . Other and unspecified hyperlipidemia   . Other and unspecified hyperlipidemia   . Other and unspecified hyperlipidemia   . Other cataract   . Other cataract   . Other dystrophy of vulva   . Other malaise and fatigue   . Rash and other nonspecific skin eruption   . Reflux esophagitis   . Thrombocytopenia, unspecified   . Type II or unspecified type diabetes mellitus without mention of complication, not stated as uncontrolled   . Type II or unspecified type diabetes mellitus without mention of complication, not stated as uncontrolled   . Unspecified disorder of skin and subcutaneous tissue   . Unspecified essential hypertension   . Unspecified essential hypertension     Past Surgical History:  Procedure Laterality Date  . ABDOMINAL HYSTERECTOMY    . CESAREAN SECTION     [phone]  . CYST EXCISION  1974   Pilonidal Cyst excision  . KNEE ARTHROSCOPY Right    for meniscal tear    Family History  Problem Relation Age of Onset  . Cancer Mother     brain  . Stroke Father    Family Status  Relation Status  . Mother Deceased  . Father Deceased  . Sister Alive  . Brother Alive  . Daughter Alive  . Son Alive  . Daughter Alive  . Daughter Alive    Social History   Social History  . Marital status: Married    Spouse name: N/A  . Number of children: N/A  . Years of education: N/A   Occupational History  . Not on file.   Social History Main Topics  . Smoking  status: Former Smoker    Types: Cigarettes    Quit date: 12/13/1990  . Smokeless tobacco: Never Used  . Alcohol use No  . Drug use: No  . Sexual activity: Yes    Partners: Male     Comment: None    Other Topics Concern  . Not on file   Social History Narrative  . No narrative on file    Allergies  Allergen Reactions  . Sulfa Antibiotics       Medication List       Accurate as of 10/29/16 10:32 AM. Always use your most recent med list.          acetaminophen 325 MG tablet Commonly known as:  TYLENOL Take 325 mg by mouth daily as needed. For pain  albuterol 108 (90 Base) MCG/ACT inhaler Commonly known as:  PROVENTIL HFA;VENTOLIN HFA Inhale 2 puffs into the lungs every 6 (six) hours as needed for wheezing or shortness of breath.   AMBULATORY NON FORMULARY MEDICATION Compression stockings: to both legs when out of bed, please remove before going to bed at night 1 pair   amLODipine 5 MG tablet Commonly known as:  NORVASC Take 1 tablet (5 mg total) by mouth daily.   aspirin 81 MG tablet Take 81 mg by mouth daily.   bifidobacterium infantis capsule Take one tablet once a day   budesonide-formoterol 160-4.5 MCG/ACT inhaler Commonly known as:  SYMBICORT Inhale two puffs into the lungs twice daily   calcium-vitamin D 500-200 MG-UNIT tablet Commonly known as:  OSCAL WITH D Take 1 tablet by mouth 2 (two) times daily.   carvedilol 25 MG tablet Commonly known as:  COREG Take one tablet two times a day for blood pressure   conjugated estrogens vaginal cream Commonly known as:  PREMARIN Place vaginally daily.   ezetimibe 10 MG tablet Commonly known as:  ZETIA Take 1 tablet (10 mg total) by mouth daily.   fexofenadine 180 MG tablet Commonly known as:  ALLEGRA Take 180 mg by mouth daily.   hydrocortisone 2.5 % rectal cream Commonly known as:  ANUSOL-HC Place 1 application rectally 2 (two) times daily as needed for hemorrhoids or itching.     losartan-hydrochlorothiazide 100-25 MG tablet Commonly known as:  HYZAAR TAKE 1 TABLET ONCE A DAY FOR BLOOD PRESSURE   metFORMIN 500 MG tablet Commonly known as:  GLUCOPHAGE Take 1 tablet (500 mg total) by mouth daily with breakfast.   mometasone 50 MCG/ACT nasal spray Commonly known as:  NASONEX One spray in each nostril twice daily as needed for allergies.   multivitamin tablet Take 1 tablet by mouth daily.   ranitidine 150 MG tablet Commonly known as:  ZANTAC Take 150 mg by mouth. Take one tablet by mouth daily in the morning.   rosuvastatin 20 MG tablet Commonly known as:  CRESTOR Take one tablet three times a week, Monday, Wednesday and Friday   tiotropium 18 MCG inhalation capsule Commonly known as:  SPIRIVA Place 1 capsule (18 mcg total) into inhaler and inhale daily.   vitamin C 500 MG tablet Commonly known as:  ASCORBIC ACID Take 500 mg by mouth daily.        Review of Systems:  Review of Systems  HENT: Positive for congestion, ear pain, postnasal drip, sinus pressure and sore throat.   Respiratory: Positive for cough.   Neurological: Positive for headaches.  All other systems reviewed and are negative.   Physical Exam: Vitals:   10/29/16 0959  BP: (!) 154/80  Pulse: 62  Temp: 98.2 F (36.8 C)  TempSrc: Oral  SpO2: 99%  Weight: 162 lb 6.4 oz (73.7 kg)  Height: 5\' 1"  (1.549 m)   Body mass index is 30.69 kg/m. Physical Exam  Constitutional: She is oriented to person, place, and time. She appears well-developed and well-nourished. No distress.  HENT:  Head: Normocephalic and atraumatic.  Right Ear: Hearing, tympanic membrane, external ear and ear canal normal.  Left Ear: Hearing, tympanic membrane, external ear and ear canal normal.  Mouth/Throat: Uvula is midline, oropharynx is clear and moist and mucous membranes are normal. She does not have dentures.  TMs intact, dull b/l but no redness. No bulging. Maxillary and frontal sinus TTP with boggy  tissue texture changes. Nasal turbinate hypertrophy b/l with grey  color and dryness. Oropharynx cobblestoning and redness but no exudate. Tonsillar hypertrophy noted. No tonsillar exudates  Eyes: Conjunctivae, EOM and lids are normal. Pupils are equal, round, and reactive to light. No scleral icterus.  Neck: Trachea normal and normal range of motion. Neck supple. Carotid bruit is not present. No thyroid mass and no thyromegaly present.  Cardiovascular: Normal rate, regular rhythm, normal heart sounds and intact distal pulses.  Exam reveals no gallop and no friction rub.   No murmur heard. No carotid bruit b/l. No LE edema b/l. No calf TTP.   Pulmonary/Chest: Effort normal and breath sounds normal. She has no wheezes. She has no rhonchi. She has no rales. Right breast exhibits no inverted nipple, no mass, no nipple discharge, no skin change and no tenderness. Left breast exhibits no inverted nipple, no mass, no nipple discharge, no skin change and no tenderness. Breasts are symmetrical.  Abdominal: Soft. Normal appearance, normal aorta and bowel sounds are normal. She exhibits no pulsatile midline mass and no mass. There is no hepatosplenomegaly. There is no tenderness. There is no rigidity, no rebound and no guarding. No hernia.  Genitourinary:  Genitourinary Comments: deferred  Musculoskeletal: Normal range of motion. She exhibits edema.  Lymphadenopathy:       Head (right side): No posterior auricular adenopathy present.       Head (left side): No posterior auricular adenopathy present.    She has cervical adenopathy (R>L and TTP).       Right: No supraclavicular adenopathy present.       Left: No supraclavicular adenopathy present.  Neurological: She is alert and oriented to person, place, and time. She has normal strength and normal reflexes. No cranial nerve deficit. Gait normal.  Skin: Skin is warm, dry and intact. No rash noted. Nails show no clubbing.  Psychiatric: She has a normal mood  and affect. Her speech is normal and behavior is normal. Judgment and thought content normal. Cognition and memory are normal.   Diabetic Foot Exam - Simple   Simple Foot Form Diabetic Foot exam was performed with the following findings:  Yes 10/29/2016  4:04 PM  Visual Inspection No deformities, no ulcerations, no other skin breakdown bilaterally:  Yes Sensation Testing Intact to touch and monofilament testing bilaterally:  Yes Pulse Check Posterior Tibialis and Dorsalis pulse intact bilaterally:  Yes Comments     Labs reviewed:  Basic Metabolic Panel:  Recent Labs  02/11/16 0838 05/17/16 0840 10/26/16 0818  NA 140 141 140  K 4.2 3.8 4.7  CL 99 102 103  CO2 25 24 31   GLUCOSE 145* 149* 139*  BUN 12 10 11   CREATININE 0.71 0.65 0.68  CALCIUM 9.6 9.3 9.6  TSH  --   --  2.12   Liver Function Tests:  Recent Labs  11/10/15 0942 02/11/16 0838 05/17/16 0840 10/26/16 0818  AST 9 15  --  13  ALT 11 16 12 12   ALKPHOS 63 56  --  55  BILITOT 0.5 0.5  --  0.5  PROT 6.9 7.2  --  6.9  ALBUMIN 3.9 4.2  --  3.8   No results for input(s): LIPASE, AMYLASE in the last 8760 hours. No results for input(s): AMMONIA in the last 8760 hours. CBC:  Recent Labs  10/26/16 0818  WBC 7.5  NEUTROABS 2,625  HGB 13.0  HCT 39.4  MCV 78.2*  PLT 198   Lipid Panel:  Recent Labs  11/10/15 0942 02/11/16 0838 05/17/16 0840  CHOL 194  131 245*  HDL 57 54 62  LDLCALC 123* 67 170*  TRIG 68 52 66  CHOLHDL 3.4 2.4 4.0   Lab Results  Component Value Date   HGBA1C 6.7 (H) 10/26/2016    Procedures: No results found. ECG OBTAINED AND REVIEWED BY MYSELF: SB @ 54 bpm, nml axis, LAE. No acute ischemic changes. No change since 02/2013  Assessment/Plan   ICD-9-CM ICD-10-CM   1. Well adult exam V70.0 Z00.00   2. Acute non-recurrent sinusitis, unspecified location 461.9 J01.90 amoxicillin (AMOXIL) 500 MG capsule     DISCONTINUED: amoxicillin (AMOXIL) 500 MG capsule     DISCONTINUED:  amoxicillin (AMOXIL) 500 MG capsule  3. Extrinsic asthma, unspecified asthma severity, uncomplicated [id number] [id number] albuterol (PROVENTIL HFA;VENTOLIN HFA) 108 (90 Base) MCG/ACT inhaler     tiotropium (SPIRIVA) 18 MCG inhalation capsule     budesonide-formoterol (SYMBICORT) 160-4.5 MCG/ACT inhaler  4. Essential hypertension 401.9 I10 losartan-hydrochlorothiazide (HYZAAR) 100-25 MG tablet     carvedilol (COREG) 25 MG tablet  5. Vaginal dryness, menopausal 627.2 N95.1 conjugated estrogens (PREMARIN) vaginal cream  6. Controlled type 2 diabetes mellitus without complication, without long-term current use of insulin (HCC) 250.00 E11.9   7. Hyperlipidemia LDL goal <100 272.4 E78.5   8. Chronic seasonal allergic rhinitis due to pollen 477.0 J30.1     Pt is UTD on health maintenance. Vaccinations are UTD. Pt maintains a healthy lifestyle. Encouraged pt to exercise 30-45 minutes 4-5 times per week. Eat a well balanced diet. Avoid smoking. Limit alcohol intake. Wear seatbelt when riding in the car. Wear sun block (SPF >50) when spending extended times outside.  Add lipid panel to labs drawn already  Take amoxil 2 times daily x 10 days  Take probiotic daily while on antibiotic  Use saline nasal spray as needed to keep nose moist  Use nasonex daily as directed  Continue other medications as ordered. 90 day Rx given. She will mail them off  Follow up in 3 mos for routine visit. Fasting labs prior to appt  [place] S. Perlie Gold  [facility] [address] [place], [place] [address] [phone] Cell (Monday-Friday 8 AM - 5 PM) [phone] After 5 PM and follow prompts

## 2016-10-29 NOTE — Progress Notes (Signed)
Subjective:   Ambera Conk is a 75 y.o. female who presents for an Initial Medicare Annual Wellness Visit.  Review of Systems     Cardiac Risk Factors include: advanced age (>59men, >41 women);diabetes mellitus;hypertension;family history of premature cardiovascular disease;sedentary lifestyle;smoking/ tobacco exposure     Objective:    Today's Vitals   10/29/16 0904  BP: (!) 154/80  Pulse: 62  Temp: 98.2 F (36.8 C)  TempSrc: Oral  SpO2: 99%  Weight: 162 lb 6.4 oz (73.7 kg)  Height: 5\' 1"  (1.549 m)  PainSc: 0-No pain   Body mass index is 30.69 kg/m.   Current Medications (verified) Outpatient Encounter Prescriptions as of 10/29/2016  Medication Sig  . acetaminophen (TYLENOL) 325 MG tablet Take 325 mg by mouth daily as needed. For pain  . albuterol (PROVENTIL HFA;VENTOLIN HFA) 108 (90 BASE) MCG/ACT inhaler Inhale 2 puffs into the lungs every 6 (six) hours as needed for wheezing or shortness of breath.  . AMBULATORY NON FORMULARY MEDICATION Compression stockings: to both legs when out of bed, please remove before going to bed at night 1 pair  . amLODipine (NORVASC) 5 MG tablet Take 1 tablet (5 mg total) by mouth daily.  Marland Kitchen aspirin 81 MG tablet Take 81 mg by mouth daily.  . bifidobacterium infantis (ALIGN) capsule Take one tablet once a day  . budesonide-formoterol (SYMBICORT) 160-4.5 MCG/ACT inhaler Inhale two puffs into the lungs twice daily  . calcium-vitamin D (OSCAL WITH D) 500-200 MG-UNIT per tablet Take 1 tablet by mouth 2 (two) times daily.  . carvedilol (COREG) 25 MG tablet Take one tablet two times a day for blood pressure  . conjugated estrogens (PREMARIN) vaginal cream Place vaginally daily.  Marland Kitchen ezetimibe (ZETIA) 10 MG tablet Take 1 tablet (10 mg total) by mouth daily.  . fexofenadine (ALLEGRA) 180 MG tablet Take 180 mg by mouth daily.  . hydrocortisone (ANUSOL-HC) 2.5 % rectal cream Place 1 application rectally 2 (two) times daily as needed for  hemorrhoids or itching.  . losartan-hydrochlorothiazide (HYZAAR) 100-25 MG tablet TAKE 1 TABLET ONCE A DAY FOR BLOOD PRESSURE  . metFORMIN (GLUCOPHAGE) 500 MG tablet Take 1 tablet (500 mg total) by mouth daily with breakfast.  . mometasone (NASONEX) 50 MCG/ACT nasal spray One spray in each nostril twice daily as needed for allergies.  . Multiple Vitamin (MULTIVITAMIN) tablet Take 1 tablet by mouth daily.  . ranitidine (ZANTAC) 150 MG tablet Take 150 mg by mouth. Take one tablet by mouth daily in the morning.  . rosuvastatin (CRESTOR) 20 MG tablet Take one tablet three times a week, Monday, Wednesday and Friday  . tiotropium (SPIRIVA) 18 MCG inhalation capsule Place 1 capsule (18 mcg total) into inhaler and inhale daily.  . vitamin C (ASCORBIC ACID) 500 MG tablet Take 500 mg by mouth daily.   No facility-administered encounter medications on file as of 10/29/2016.     Allergies (verified) Sulfa antibiotics   History: Past Medical History:  Diagnosis Date  . Acute upper respiratory infections of unspecified site   . Acute upper respiratory infections of unspecified site   . Acute upper respiratory infections of unspecified site   . Allergic rhinitis due to pollen   . Candidiasis of mouth   . Disorder of bone and cartilage, unspecified   . Diverticulitis of colon (without mention of hemorrhage)(562.11)   . Essential hypertension, benign   . Essential hypertension, benign   . Extrinsic asthma, unspecified   . Herpes simplex with other ophthalmic  complications   . Other abnormal glucose   . Other and unspecified hyperlipidemia   . Other and unspecified hyperlipidemia   . Other and unspecified hyperlipidemia   . Other cataract   . Other cataract   . Other dystrophy of vulva   . Other malaise and fatigue   . Rash and other nonspecific skin eruption   . Reflux esophagitis   . Thrombocytopenia, unspecified   . Type II or unspecified type diabetes mellitus without mention of  complication, not stated as uncontrolled   . Type II or unspecified type diabetes mellitus without mention of complication, not stated as uncontrolled   . Unspecified disorder of skin and subcutaneous tissue   . Unspecified essential hypertension   . Unspecified essential hypertension    Past Surgical History:  Procedure Laterality Date  . ABDOMINAL HYSTERECTOMY    . CESAREAN SECTION     [phone]  . CYST EXCISION  1974   Pilonidal Cyst excision  . KNEE ARTHROSCOPY Right    for meniscal tear   Family History  Problem Relation Age of Onset  . Cancer Mother     brain  . Stroke Father    Social History   Occupational History  . Not on file.   Social History Main Topics  . Smoking status: Former Smoker    Types: Cigarettes    Quit date: 12/13/1990  . Smokeless tobacco: Never Used  . Alcohol use No  . Drug use: No  . Sexual activity: Yes    Partners: Male     Comment: None     Tobacco Counseling Counseling given: No   Activities of Daily Living In your present state of health, do you have any difficulty performing the following activities: 10/29/2016  Hearing? N  Vision? N  Difficulty concentrating or making decisions? N  Walking or climbing stairs? N  Dressing or bathing? N  Doing errands, shopping? N  Preparing Food and eating ? N  Using the Toilet? N  In the past six months, have you accidently leaked urine? N  Do you have problems with loss of bowel control? N  Managing your Medications? N  Managing your Finances? N  Housekeeping or managing your Housekeeping? N  Some recent data might be hidden    Immunizations and Health Maintenance Immunization History  Administered Date(s) Administered  . Influenza Split 08/25/2010, 08/26/2011, 10/04/2012  . Influenza Whole 09/24/2009  . Influenza,inj,Quad PF,36+ Mos 09/11/2013, 09/04/2014, 11/12/2015, 10/04/2016  . Pneumococcal Conjugate-13 02/17/2012  . Pneumococcal Polysaccharide-23 05/19/2016  . Tdap  02/17/2012   Health Maintenance Due  Topic Date Due  . FOOT EXAM  05/06/2016    Patient Care Team: Gildardo Cranker, DO as PCP - General (Internal Medicine) Teena Irani, MD as Consulting Physician (Gastroenterology) Warden Fillers, MD as Consulting Physician (Ophthalmology) Hurman Horn, MD as Consulting Physician (Ophthalmology) Mickel Fuchs, MD as Referring Physician (Psychiatry)  Indicate any recent Medical Services you may have received from other than [facility] providers in the past year (date may be approximate).     Assessment:   This is a routine wellness examination for Nayana.  Hearing/Vision screen Hearing Screening Comments: It's been at least 7 yrs since last hearing screen done.  Vision Screening Comments: Last eye exam done in Sept. 2017. Pt sees Dr. Jamison Oka.   Dietary issues and exercise activities discussed: Current Exercise Habits: The patient does not participate in regular exercise at present, Exercise limited by: None identified  Goals    . Weight (lb) <  200 lb (90.7 kg)          Starting 10/29/16, I will attempt to maintain my current weight or decrease when possible.       Depression Screen PHQ 2/9 Scores 10/29/2016 02/04/2015 09/25/2014 09/04/2014 05/07/2014 03/06/2013  PHQ - 2 Score 0 0 0 0 0 0    Fall Risk Fall Risk  10/29/2016 05/19/2016 11/12/2015 08/08/2015 02/04/2015  Falls in the past year? No No No No No    Cognitive Function: MMSE - Mini Mental State Exam 10/29/2016 09/04/2014  Orientation to time 5 5  Orientation to Place 5 5  Registration 3 3  Attention/ Calculation 5 3  Recall 2 1  Language- name 2 objects 2 2  Language- repeat 1 1  Language- follow 3 step command 2 2  Language- read & follow direction 1 1  Write a sentence 1 1  Copy design 1 1  Total score 28 25        Screening Tests Health Maintenance  Topic Date Due  . FOOT EXAM  05/06/2016  . ZOSTAVAX  12/12/2018 (Originally 04/13/2002)  . OPHTHALMOLOGY EXAM  01/19/2017    . HEMOGLOBIN A1C  04/25/2017  . MAMMOGRAM  12/16/2017  . TETANUS/TDAP  02/16/2022  . COLONOSCOPY  12/21/2025  . INFLUENZA VACCINE  Completed  . DEXA SCAN  Completed  . PNA vac Low Risk Adult  Completed      Plan:    I have personally reviewed and addressed the Medicare Annual Wellness questionnaire and have noted the following in the patient's chart:  A. Medical and social history B. Use of alcohol, tobacco or illicit drugs  C. Current medications and supplements D. Functional ability and status E.  Nutritional status F.  Physical activity G. Advance directives H. List of other physicians I.  Hospitalizations, surgeries, and ER visits in previous 12 months J.  [place] to include hearing, vision, cognitive, depression L. Referrals and appointments - none  In addition, I have reviewed and discussed with patient certain preventive protocols, quality metrics, and best practice recommendations. A written personalized care plan for preventive services as well as general preventive health recommendations were provided to patient.  See attached scanned questionnaire for additional information.   Signed,   Allyn Kenner, LPN Health Advisor   I have reviewed the health advisor's note and was available for consultation. I agree with documentation and plan.   [place] S. Perlie Gold  [facility] [address] [place], [place] [address] [phone] Cell (Monday-Friday 8 AM - 5 PM) [phone] After 5 PM and follow prompts

## 2016-10-29 NOTE — Patient Instructions (Signed)
Tanya Bell , Thank you for taking time to come for your Medicare Wellness Visit. I appreciate your ongoing commitment to your health goals. Please review the following plan we discussed and let me know if I can assist you in the future.   These are the goals we discussed: Goals    None      This is a list of the screening recommended for you and due dates:  Health Maintenance  Topic Date Due  . Shingles Vaccine  04/13/2002  . Colon Cancer Screening  12/24/2015  . Complete foot exam   05/06/2016  . Eye exam for diabetics  01/19/2017  . Hemoglobin A1C  04/25/2017  . Mammogram  12/16/2017  . Tetanus Vaccine  02/16/2022  . Flu Shot  Completed  . DEXA scan (bone density measurement)  Completed  . Pneumonia vaccines  Completed  Preventive Care for Adults  A healthy lifestyle and preventive care can promote health and wellness. Preventive health guidelines for adults include the following key practices.  . A routine yearly physical is a good way to check with your health care provider about your health and preventive screening. It is a chance to share any concerns and updates on your health and to receive a thorough exam.  . Visit your dentist for a routine exam and preventive care every 6 months. Brush your teeth twice a day and floss once a day. Good oral hygiene prevents tooth decay and gum disease.  . The frequency of eye exams is based on your age, health, family medical history, use  of contact lenses, and other factors. Follow your health care provider's ecommendations for frequency of eye exams.  . Eat a healthy diet. Foods like vegetables, fruits, whole grains, low-fat dairy products, and lean protein foods contain the nutrients you need without too many calories. Decrease your intake of foods high in solid fats, added sugars, and salt. Eat the right amount of calories for you. Get information about a proper diet from your health care provider, if necessary.  . Regular  physical exercise is one of the most important things you can do for your health. Most adults should get at least 150 minutes of moderate-intensity exercise (any activity that increases your heart rate and causes you to sweat) each week. In addition, most adults need muscle-strengthening exercises on 2 or more days a week.  Silver Sneakers may be a benefit available to you. To determine eligibility, you may visit the website: www.silversneakers.com or contact program at [phone] Mon-Fri between 8AM-8PM.   . Maintain a healthy weight. The body mass index (BMI) is a screening tool to identify possible weight problems. It provides an estimate of body fat based on height and weight. Your health care provider can find your BMI and can help you achieve or maintain a healthy weight.   For adults 20 years and older: ? A BMI below 18.5 is considered underweight. ? A BMI of 18.5 to 24.9 is normal. ? A BMI of 25 to 29.9 is considered overweight. ? A BMI of 30 and above is considered obese.   . Maintain normal blood lipids and cholesterol levels by exercising and minimizing your intake of saturated fat. Eat a balanced diet with plenty of fruit and vegetables. Blood tests for lipids and cholesterol should begin at age 20 and be repeated every 5 years. If your lipid or cholesterol levels are high, you are over 50, or you are at high risk for heart disease, you may  need your cholesterol levels checked more frequently. Ongoing high lipid and cholesterol levels should be treated with medicines if diet and exercise are not working.  . If you smoke, find out from your health care provider how to quit. If you do not use tobacco, please do not start.  . If you choose to drink alcohol, please do not consume more than 2 drinks per day. One drink is considered to be 12 ounces (355 mL) of beer, 5 ounces (148 mL) of wine, or 1.5 ounces (44 mL) of liquor.  . If you are 93-35 years old, ask your health care provider if  you should take aspirin to prevent strokes.  . Use sunscreen. Apply sunscreen liberally and repeatedly throughout the day. You should seek shade when your shadow is shorter than you. Protect yourself by wearing long sleeves, pants, a wide-brimmed hat, and sunglasses year round, whenever you are outdoors.  . Once a month, do a whole body skin exam, using a mirror to look at the skin on your back. Tell your health care provider of new moles, moles that have irregular borders, moles that are larger than a pencil eraser, or moles that have changed in shape or color.

## 2016-11-02 ENCOUNTER — Telehealth: Payer: Self-pay | Admitting: *Deleted

## 2016-11-02 NOTE — Telephone Encounter (Signed)
Received a phone call from [facility] to inform Dr. Eulas Post that the script for amoxicillin has been sent already.

## 2016-12-31 ENCOUNTER — Telehealth: Payer: Self-pay | Admitting: *Deleted

## 2016-12-31 NOTE — Telephone Encounter (Signed)
Patient called and stated that she no longer uses [facility] for her diabetic supplies because they no longer deliver. She is now going to use [facility]. Stated they will be faxing [place] some paperwork.

## 2017-01-12 ENCOUNTER — Encounter: Payer: Self-pay | Admitting: Internal Medicine

## 2017-01-12 DIAGNOSIS — H43822 Vitreomacular adhesion, left eye: Secondary | ICD-10-CM | POA: Diagnosis not present

## 2017-01-12 DIAGNOSIS — E119 Type 2 diabetes mellitus without complications: Secondary | ICD-10-CM | POA: Diagnosis not present

## 2017-01-12 DIAGNOSIS — H35349 Macular cyst, hole, or pseudohole, unspecified eye: Secondary | ICD-10-CM | POA: Diagnosis not present

## 2017-01-12 DIAGNOSIS — H43821 Vitreomacular adhesion, right eye: Secondary | ICD-10-CM | POA: Diagnosis not present

## 2017-01-12 LAB — HM DIABETES EYE EXAM

## 2017-01-20 ENCOUNTER — Telehealth: Payer: Self-pay

## 2017-01-20 NOTE — Telephone Encounter (Signed)
Left message on voicemail for patient to return call when available, reason for call: question if patient requested DM supplies from [employer].  Form located in [place]

## 2017-01-21 NOTE — Telephone Encounter (Signed)
Patient stated that she wants to use [facility]. Patient will have company to fax the Diabetic Supply form to be completed by our office.

## 2017-01-21 NOTE — Telephone Encounter (Signed)
Patient called back and stated that she is not familiar with this company. Patient wsa given the number to call. Patient will call me back once she investigates

## 2017-01-24 ENCOUNTER — Telehealth: Payer: Self-pay

## 2017-01-24 NOTE — Telephone Encounter (Signed)
A fax was received from [place] requesting glucose testing supplies for patient. Patient had previously stated that she would be using this company for her diabetic supplies.  Form was partially completed and placed in Dr. Vale Haven folder for completion and signing. Office note was printed for 10/29/2016 and attached to forms.   Please fax completed form and OV notes to [phone].

## 2017-02-11 ENCOUNTER — Other Ambulatory Visit: Payer: Self-pay | Admitting: Internal Medicine

## 2017-02-11 DIAGNOSIS — I10 Essential (primary) hypertension: Secondary | ICD-10-CM

## 2017-02-21 ENCOUNTER — Telehealth: Payer: Self-pay | Admitting: *Deleted

## 2017-02-21 NOTE — Telephone Encounter (Signed)
Patient called and stated that [facility] needed the form refaxed with Insulin Use checked No. Refaxed form to [facility] Fax: [phone]

## 2017-03-02 ENCOUNTER — Telehealth: Payer: Self-pay | Admitting: *Deleted

## 2017-03-02 NOTE — Telephone Encounter (Signed)
Patient called and stated that [facility] supply company stated that they have not received a completed form from [place] yet. Stated that the testing frequency was not filled out. I checked form and it has been filled out completely and already faxed twice. Refaxed again and informed patient.

## 2017-03-04 ENCOUNTER — Telehealth: Payer: Self-pay

## 2017-03-04 NOTE — Telephone Encounter (Signed)
Patient has a lab appt scheduled for 06/06/17 but there are no orders for labs. Dr. Eulas Post put in her note that she wanted patient to get labs done prior to next visit. Please advise

## 2017-03-07 NOTE — Telephone Encounter (Signed)
Still awaiting response from Dr. Nyoka Cowden.

## 2017-03-07 NOTE — Telephone Encounter (Signed)
Order: TSH,  A1c, CBC, CMP

## 2017-03-08 ENCOUNTER — Other Ambulatory Visit: Payer: Self-pay

## 2017-03-09 NOTE — Telephone Encounter (Signed)
Orders entered

## 2017-04-06 ENCOUNTER — Encounter: Payer: Self-pay | Admitting: *Deleted

## 2017-04-06 DIAGNOSIS — E119 Type 2 diabetes mellitus without complications: Secondary | ICD-10-CM | POA: Diagnosis not present

## 2017-04-06 DIAGNOSIS — H43821 Vitreomacular adhesion, right eye: Secondary | ICD-10-CM | POA: Diagnosis not present

## 2017-04-06 DIAGNOSIS — H43822 Vitreomacular adhesion, left eye: Secondary | ICD-10-CM | POA: Diagnosis not present

## 2017-04-06 DIAGNOSIS — H35349 Macular cyst, hole, or pseudohole, unspecified eye: Secondary | ICD-10-CM | POA: Diagnosis not present

## 2017-04-06 LAB — HM DIABETES EYE EXAM

## 2017-04-08 LAB — HM DIABETES EYE EXAM

## 2017-04-11 ENCOUNTER — Telehealth: Payer: Self-pay | Admitting: *Deleted

## 2017-04-11 MED ORDER — ALBUTEROL SULFATE HFA 108 (90 BASE) MCG/ACT IN AERS: 2.0000 | INHALATION_SPRAY | Freq: Four times a day (QID) | RESPIRATORY_TRACT | 5 refills | Status: DC | PRN

## 2017-04-11 NOTE — Telephone Encounter (Signed)
Patient notified and agreed. Rx faxed to pharmacy.  

## 2017-04-11 NOTE — Telephone Encounter (Signed)
Patient called and stated that her insurance will no longer cover her Proventil Inhaler and wants ProAir or Ventolin called to her pharmacy before Thursday. Please Advise. (Dr. Vale Haven Patient)

## 2017-04-11 NOTE — Telephone Encounter (Signed)
It is Ok to make this switch. Try Ventolin with the same directions as the Proventil.

## 2017-04-14 DIAGNOSIS — H43821 Vitreomacular adhesion, right eye: Secondary | ICD-10-CM | POA: Diagnosis not present

## 2017-04-14 DIAGNOSIS — H33011 Retinal detachment with single break, right eye: Secondary | ICD-10-CM | POA: Diagnosis not present

## 2017-04-14 HISTORY — PX: EYE SURGERY: SHX253

## 2017-04-15 ENCOUNTER — Encounter: Payer: Self-pay | Admitting: Internal Medicine

## 2017-04-15 LAB — HM DIABETES EYE EXAM

## 2017-04-21 LAB — HM DIABETES EYE EXAM

## 2017-05-02 ENCOUNTER — Encounter: Payer: Self-pay | Admitting: Internal Medicine

## 2017-05-02 LAB — HM DIABETES EYE EXAM

## 2017-05-11 ENCOUNTER — Encounter: Payer: Self-pay | Admitting: Internal Medicine

## 2017-05-11 LAB — HM DIABETES EYE EXAM

## 2017-05-26 ENCOUNTER — Encounter: Payer: Self-pay | Admitting: Internal Medicine

## 2017-05-26 DIAGNOSIS — H43821 Vitreomacular adhesion, right eye: Secondary | ICD-10-CM | POA: Diagnosis not present

## 2017-05-26 DIAGNOSIS — Z09 Encounter for follow-up examination after completed treatment for conditions other than malignant neoplasm: Secondary | ICD-10-CM | POA: Diagnosis not present

## 2017-05-26 DIAGNOSIS — H35349 Macular cyst, hole, or pseudohole, unspecified eye: Secondary | ICD-10-CM | POA: Diagnosis not present

## 2017-05-26 LAB — HM DIABETES EYE EXAM

## 2017-06-06 ENCOUNTER — Other Ambulatory Visit: Payer: Medicare Other

## 2017-06-06 ENCOUNTER — Other Ambulatory Visit: Payer: Self-pay

## 2017-06-06 DIAGNOSIS — I10 Essential (primary) hypertension: Secondary | ICD-10-CM

## 2017-06-06 DIAGNOSIS — E785 Hyperlipidemia, unspecified: Secondary | ICD-10-CM

## 2017-06-06 DIAGNOSIS — E119 Type 2 diabetes mellitus without complications: Secondary | ICD-10-CM | POA: Diagnosis not present

## 2017-06-06 LAB — CBC WITH DIFFERENTIAL/PLATELET
Basophils Absolute: 51 cells/uL (ref 0–200)
Basophils Relative: 1 %
EOS PCT: 4 %
Eosinophils Absolute: 204 cells/uL (ref 15–500)
HCT: 41.5 % (ref 35.0–45.0)
HEMOGLOBIN: 13.6 g/dL (ref 11.7–15.5)
LYMPHS ABS: 2805 {cells}/uL (ref 850–3900)
LYMPHS PCT: 55 %
MCH: 25.7 pg — AB (ref 27.0–33.0)
MCHC: 32.8 g/dL (ref 32.0–36.0)
MCV: 78.3 fL — ABNORMAL LOW (ref 80.0–100.0)
MONOS PCT: 8 %
Monocytes Absolute: 408 cells/uL (ref 200–950)
NEUTROS PCT: 32 %
Neutro Abs: 1632 cells/uL (ref 1500–7800)
Platelets: 190 10*3/uL (ref 140–400)
RBC: 5.3 MIL/uL — AB (ref 3.80–5.10)
RDW: 16.8 % — AB (ref 11.0–15.0)
WBC: 5.1 10*3/uL (ref 3.8–10.8)

## 2017-06-06 LAB — MICROALBUMIN / CREATININE URINE RATIO
CREATININE, URINE: 155 mg/dL (ref 20–320)
MICROALB UR: 3.4 mg/dL
MICROALB/CREAT RATIO: 22 ug/mg{creat} (ref <30)

## 2017-06-06 LAB — COMPREHENSIVE METABOLIC PANEL
ALBUMIN: 3.9 g/dL (ref 3.6–5.1)
ALK PHOS: 61 U/L (ref 33–130)
ALT: 13 U/L (ref 6–29)
AST: 13 U/L (ref 10–35)
BILIRUBIN TOTAL: 0.5 mg/dL (ref 0.2–1.2)
BUN: 12 mg/dL (ref 7–25)
CALCIUM: 9.5 mg/dL (ref 8.6–10.4)
CO2: 27 mmol/L (ref 20–31)
CREATININE: 0.65 mg/dL (ref 0.60–0.93)
Chloride: 105 mmol/L (ref 98–110)
Glucose, Bld: 113 mg/dL — ABNORMAL HIGH (ref 65–99)
Potassium: 4.6 mmol/L (ref 3.5–5.3)
SODIUM: 141 mmol/L (ref 135–146)
TOTAL PROTEIN: 7.2 g/dL (ref 6.1–8.1)

## 2017-06-06 LAB — LIPID PANEL
CHOL/HDL RATIO: 2.4 ratio (ref <5.0)
CHOLESTEROL: 140 mg/dL (ref <200)
HDL: 59 mg/dL (ref >50)
LDL Cholesterol: 71 mg/dL (ref <100)
Triglycerides: 52 mg/dL (ref <150)
VLDL: 10 mg/dL (ref <30)

## 2017-06-06 LAB — TSH: TSH: 1.69 m[IU]/L

## 2017-06-07 LAB — HEMOGLOBIN A1C
Hgb A1c MFr Bld: 6.3 % — ABNORMAL HIGH (ref <5.7)
MEAN PLASMA GLUCOSE: 134 mg/dL

## 2017-06-08 ENCOUNTER — Encounter: Payer: Self-pay | Admitting: Internal Medicine

## 2017-06-08 ENCOUNTER — Ambulatory Visit (INDEPENDENT_AMBULATORY_CARE_PROVIDER_SITE_OTHER): Payer: Medicare Other | Admitting: Internal Medicine

## 2017-06-08 VITALS — BP 128/80 | HR 54 | Temp 97.9°F | Ht 61.0 in | Wt 162.6 lb

## 2017-06-08 DIAGNOSIS — E785 Hyperlipidemia, unspecified: Secondary | ICD-10-CM | POA: Diagnosis not present

## 2017-06-08 DIAGNOSIS — Z7189 Other specified counseling: Secondary | ICD-10-CM

## 2017-06-08 DIAGNOSIS — E669 Obesity, unspecified: Secondary | ICD-10-CM

## 2017-06-08 DIAGNOSIS — I10 Essential (primary) hypertension: Secondary | ICD-10-CM

## 2017-06-08 DIAGNOSIS — J301 Allergic rhinitis due to pollen: Secondary | ICD-10-CM | POA: Diagnosis not present

## 2017-06-08 DIAGNOSIS — E119 Type 2 diabetes mellitus without complications: Secondary | ICD-10-CM | POA: Diagnosis not present

## 2017-06-08 DIAGNOSIS — J45909 Unspecified asthma, uncomplicated: Secondary | ICD-10-CM | POA: Diagnosis not present

## 2017-06-08 MED ORDER — AMLODIPINE BESYLATE 5 MG PO TABS: 5.0000 mg | ORAL_TABLET | Freq: Every day | ORAL | 3 refills | Status: DC

## 2017-06-08 MED ORDER — ZOSTER VAC RECOMB ADJUVANTED 50 MCG/0.5ML IM SUSR: 0.5000 mL | Freq: Once | INTRAMUSCULAR | 1 refills | Status: AC

## 2017-06-08 MED ORDER — CARVEDILOL 25 MG PO TABS: ORAL_TABLET | ORAL | 3 refills | Status: DC

## 2017-06-08 MED ORDER — MOMETASONE FUROATE 50 MCG/ACT NA SUSP: NASAL | 3 refills | Status: DC

## 2017-06-08 NOTE — Patient Instructions (Addendum)
Ok to start [employer] plan as directed  Continue current medications as ordered  Follow up with specialists as scheduled  Follow up in 3 mos for DM, HTN, hyperlipidemia. Fasting labs prior to appt

## 2017-06-08 NOTE — Progress Notes (Signed)
Patient ID: Tanya Bell, female   DOB: Apr 18, 1941, 76 y.o.   MRN: [id number]    Location:  [facility] Place of Service: OFFICE  Chief Complaint  Patient presents with  . Medical Management of Chronic Issues    Routine Visit  . Advanced Directive    Discuss Advance Directive    HPI:  76 yo female seen today for f/u. She has no concerns. Lab results reviewed. She would like to lose weight and interested in [employer] (yogurt, soy and honey) which is gluten free and geared towards diabetics.  DM - no low BS reactions on metformin. A1c 6.3%. BS at home fluctuating but believes related to meter. She has ariva meter. Urine microalbumin/Cr ratio 22  HTN - BP controlled on losartan HCT, amlodipine, coreg. She also takes ASA daily  Hyperlipidemia - no myalgias. Takes crestor 1/2 tab on MWF due to previous rxn of myalgias and daily zetia. LDL 71 with Total chol 140. She has missed several doses of crestor  Seasonal allergy/asthma - no exacerbations. Last PFTs several years ago in [place]. Stable on nasonex. She uses spiriva, symbicort, proventil hfa  Vaginal dryness - stable on premarin cream  GERD - stable on zantac. Takes align. She did not like the way levsin made her feel (tongue felt numb) and she stopped taking it   Skin lesions - she saw derm in Jan 2017 and dx with SK, seborrhea dermatitis and cherry angioma. She was rx ketoconazole 2% crm to apply to cheek area which has helped.   She is UTD on vaccinations. She has declined zostavax in the past but willing to proceed with vaccine   Past Medical History:  Diagnosis Date  . Acute upper respiratory infections of unspecified site   . Acute upper respiratory infections of unspecified site   . Acute upper respiratory infections of unspecified site   . Allergic rhinitis due to pollen   . Candidiasis of mouth   . Disorder of bone and cartilage, unspecified   . Diverticulitis of colon (without mention of hemorrhage)(562.11)   .  Essential hypertension, benign   . Essential hypertension, benign   . Extrinsic asthma, unspecified   . Herpes simplex with other ophthalmic complications   . Other abnormal glucose   . Other and unspecified hyperlipidemia   . Other and unspecified hyperlipidemia   . Other and unspecified hyperlipidemia   . Other cataract   . Other cataract   . Other dystrophy of vulva   . Other malaise and fatigue   . Rash and other nonspecific skin eruption   . Reflux esophagitis   . Thrombocytopenia, unspecified (Miguel Barrera)   . Type II or unspecified type diabetes mellitus without mention of complication, not stated as uncontrolled   . Type II or unspecified type diabetes mellitus without mention of complication, not stated as uncontrolled   . Unspecified disorder of skin and subcutaneous tissue   . Unspecified essential hypertension   . Unspecified essential hypertension     Past Surgical History:  Procedure Laterality Date  . ABDOMINAL HYSTERECTOMY    . CESAREAN SECTION     [phone]  . CYST EXCISION  1974   Pilonidal Cyst excision  . EYE SURGERY Right 04/14/2017   Dr. Zadie Rhine  . KNEE ARTHROSCOPY Right    for meniscal tear    Patient Care Team: Gildardo Cranker, DO as PCP - General (Internal Medicine) Teena Irani, MD as Consulting Physician (Gastroenterology) Warden Fillers, MD as Consulting Physician (Ophthalmology) Deloria Lair  A, MD as Consulting Physician (Ophthalmology) Mickel Fuchs, MD as Referring Physician (Psychiatry)  Social History   Social History  . Marital status: Married    Spouse name: N/A  . Number of children: N/A  . Years of education: N/A   Occupational History  . Not on file.   Social History Main Topics  . Smoking status: Former Smoker    Types: Cigarettes    Quit date: 12/13/1990  . Smokeless tobacco: Never Used  . Alcohol use No  . Drug use: No  . Sexual activity: Yes    Partners: Male     Comment: None    Other Topics Concern  . Not on  file   Social History Narrative  . No narrative on file     reports that she quit smoking about 26 years ago. Her smoking use included Cigarettes. She has never used smokeless tobacco. She reports that she does not drink alcohol or use drugs.  Family History  Problem Relation Age of Onset  . Cancer Mother        brain  . Stroke Father    Family Status  Relation Status  . Mother Deceased  . Father Deceased  . Sister Alive  . Brother Alive  . Daughter Alive  . Son Alive  . Daughter Alive  . Daughter Alive     Allergies  Allergen Reactions  . Sulfa Antibiotics     Medications: Patient's Medications  New Prescriptions   No medications on file  Previous Medications   ACETAMINOPHEN (TYLENOL) 325 MG TABLET    Take 325 mg by mouth daily as needed. For pain   ALBUTEROL (VENTOLIN HFA) 108 (90 BASE) MCG/ACT INHALER    Inhale 2 puffs into the lungs every 6 (six) hours as needed for wheezing or shortness of breath.   AMBULATORY NON FORMULARY MEDICATION    Compression stockings: to both legs when out of bed, please remove before going to bed at night 1 pair   AMLODIPINE (NORVASC) 5 MG TABLET    Take 1 tablet (5 mg total) by mouth daily.   AMOXICILLIN (AMOXIL) 500 MG CAPSULE    Take 1 capsule (500 mg total) by mouth 2 (two) times daily.   ASPIRIN 81 MG TABLET    Take 81 mg by mouth daily.   BIFIDOBACTERIUM INFANTIS (ALIGN) CAPSULE    Take one tablet once a day   BUDESONIDE-FORMOTEROL (SYMBICORT) 160-4.5 MCG/ACT INHALER    Inhale two puffs into the lungs twice daily   CALCIUM-VITAMIN D (OSCAL WITH D) 500-200 MG-UNIT PER TABLET    Take 1 tablet by mouth 2 (two) times daily.   CARVEDILOL (COREG) 25 MG TABLET    TAKE 1 TABLET TWICE A DAY FOR BLOOD PRESSURE   CONJUGATED ESTROGENS (PREMARIN) VAGINAL CREAM    Place vaginally daily.   EZETIMIBE (ZETIA) 10 MG TABLET    Take 1 tablet (10 mg total) by mouth daily.   FEXOFENADINE (ALLEGRA) 180 MG TABLET    Take 180 mg by mouth daily.    HYDROCORTISONE (ANUSOL-HC) 2.5 % RECTAL CREAM    Place 1 application rectally 2 (two) times daily as needed for hemorrhoids or itching.   LOSARTAN-HYDROCHLOROTHIAZIDE (HYZAAR) 100-25 MG TABLET    TAKE 1 TABLET ONCE A DAY FOR BLOOD PRESSURE   METFORMIN (GLUCOPHAGE) 500 MG TABLET    Take 1 tablet (500 mg total) by mouth daily with breakfast.   MOMETASONE (NASONEX) 50 MCG/ACT NASAL SPRAY    One spray in each  nostril twice daily as needed for allergies.   MULTIPLE VITAMIN (MULTIVITAMIN) TABLET    Take 1 tablet by mouth daily.   RANITIDINE (ZANTAC) 150 MG TABLET    Take 150 mg by mouth. Take one tablet by mouth daily in the morning.   ROSUVASTATIN (CRESTOR) 20 MG TABLET    Take one tablet three times a week, Monday, Wednesday and Friday   TIOTROPIUM (SPIRIVA) 18 MCG INHALATION CAPSULE    Place 1 capsule (18 mcg total) into inhaler and inhale daily.   VITAMIN C (ASCORBIC ACID) 500 MG TABLET    Take 500 mg by mouth daily.  Modified Medications   Modified Medication Previous Medication   ZOSTER VAC RECOMB ADJUVANTED (SHINGRIX) INJECTION Zoster Vac Recomb Adjuvanted (SHINGRIX) injection      Inject 0.5 mLs into the muscle once.    Inject 0.5 mLs into the muscle once.  Discontinued Medications   No medications on file    Review of Systems  HENT: Positive for congestion.   Musculoskeletal: Positive for gait problem (due to obesity).  All other systems reviewed and are negative.   Vitals:   06/08/17 0820  BP: 128/80  Pulse: (!) 54  Temp: 97.9 F (36.6 C)  TempSrc: Oral  SpO2: 94%  Weight: 162 lb 9.6 oz (73.8 kg)  Height: 5' 1"  (1.549 m)   Body mass index is 30.72 kg/m.  Physical Exam  Constitutional: She is oriented to person, place, and time. She appears well-developed and well-nourished.  HENT:  Mouth/Throat: Oropharynx is clear and moist. No oropharyngeal exudate.  Eyes: Pupils are equal, round, and reactive to light. No scleral icterus.  Neck: Neck supple. Carotid bruit is not  present. No tracheal deviation present.  Cardiovascular: Normal rate, regular rhythm, normal heart sounds and intact distal pulses.  Exam reveals no gallop and no friction rub.   No murmur heard. No LE edema b/l. no calf TTP.   Pulmonary/Chest: Effort normal and breath sounds normal. No stridor. No respiratory distress. She has no wheezes. She has no rales.  Abdominal: Soft. Bowel sounds are normal. She exhibits distension. She exhibits no mass. There is no hepatomegaly. There is tenderness (epigastric). There is no rebound and no guarding.  Lymphadenopathy:    She has no cervical adenopathy.  Neurological: She is alert and oriented to person, place, and time.  Skin: Skin is warm and dry. No rash noted.  Psychiatric: She has a normal mood and affect. Her behavior is normal. Judgment and thought content normal.     Labs reviewed: Appointment on 06/06/2017  Component Date Value Ref Range Status  . Sodium 06/06/2017 141  135 - 146 mmol/L Final  . Potassium 06/06/2017 4.6  3.5 - 5.3 mmol/L Final  . Chloride 06/06/2017 105  98 - 110 mmol/L Final  . CO2 06/06/2017 27  20 - 31 mmol/L Final  . Glucose, Bld 06/06/2017 113* 65 - 99 mg/dL Final  . BUN 06/06/2017 12  7 - 25 mg/dL Final  . Creat 06/06/2017 0.65  0.60 - 0.93 mg/dL Final   Comment:   For patients > or = 75 years of age: The upper reference limit for Creatinine is approximately 13% higher for people identified as African-American.     . Total Bilirubin 06/06/2017 0.5  0.2 - 1.2 mg/dL Final  . Alkaline Phosphatase 06/06/2017 61  33 - 130 U/L Final  . AST 06/06/2017 13  10 - 35 U/L Final  . ALT 06/06/2017 13  6 - 29 U/L  Final  . Total Protein 06/06/2017 7.2  6.1 - 8.1 g/dL Final  . Albumin 06/06/2017 3.9  3.6 - 5.1 g/dL Final  . Calcium 06/06/2017 9.5  8.6 - 10.4 mg/dL Final  . Hgb A1c MFr Bld 06/06/2017 6.3* <5.7 % Final   Comment:   For someone without known diabetes, a hemoglobin A1c value between 5.7% and 6.4% is consistent  with prediabetes and should be confirmed with a follow-up test.   For someone with known diabetes, a value <7% indicates that their diabetes is well controlled. A1c targets should be individualized based on duration of diabetes, age, co-morbid conditions and other considerations.   This assay result is consistent with an increased risk of diabetes.   Currently, no consensus exists regarding use of hemoglobin A1c for diagnosis of diabetes in children.     . Mean Plasma Glucose 06/06/2017 134  mg/dL Final  . WBC 06/06/2017 5.1  3.8 - 10.8 K/uL Final  . RBC 06/06/2017 5.30* 3.80 - 5.10 MIL/uL Final  . Hemoglobin 06/06/2017 13.6  11.7 - 15.5 g/dL Final  . HCT 06/06/2017 41.5  35.0 - 45.0 % Final  . MCV 06/06/2017 78.3* 80.0 - 100.0 fL Final  . MCH 06/06/2017 25.7* 27.0 - 33.0 pg Final  . MCHC 06/06/2017 32.8  32.0 - 36.0 g/dL Final  . RDW 06/06/2017 16.8* 11.0 - 15.0 % Final  . Platelets 06/06/2017 190  140 - 400 K/uL Final  . MPV 06/06/2017 CANCELED  7.5 - 12.5 fL Final-Edited   Comment:   Result not calculated because one or more required values exceed analytical limits.    Result canceled by the ancillary   . Neutro Abs 06/06/2017 1632  1,500 - 7,800 cells/uL Final  . Lymphs Abs 06/06/2017 2805  850 - 3,900 cells/uL Final  . Monocytes Absolute 06/06/2017 408  200 - 950 cells/uL Final  . Eosinophils Absolute 06/06/2017 204  15 - 500 cells/uL Final  . Basophils Absolute 06/06/2017 51  0 - 200 cells/uL Final  . Neutrophils Relative % 06/06/2017 32  % Final  . Lymphocytes Relative 06/06/2017 55  % Final  . Monocytes Relative 06/06/2017 8  % Final  . Eosinophils Relative 06/06/2017 4  % Final  . Basophils Relative 06/06/2017 1  % Final  . Smear Review 06/06/2017 Criteria for review not met   Final  . TSH 06/06/2017 1.69  mIU/L Final   Comment:   Reference Range   > or = 20 Years  0.40-4.50   Pregnancy Range First trimester  0.26-2.66 Second trimester 0.55-2.73 Third  trimester  0.43-2.91     . Cholesterol 06/06/2017 140  <200 mg/dL Final  . Triglycerides 06/06/2017 52  <150 mg/dL Final  . HDL 06/06/2017 59  >50 mg/dL Final  . Total CHOL/HDL Ratio 06/06/2017 2.4  <5.0 Ratio Final  . VLDL 06/06/2017 10  <30 mg/dL Final  . LDL Cholesterol 06/06/2017 71  <100 mg/dL Final  . Creatinine, Urine 06/06/2017 155  20 - 320 mg/dL Final  . Microalb, Ur 06/06/2017 3.4  Not estab mg/dL Final  . Microalb Creat Ratio 06/06/2017 22  <30 mcg/mg creat Final   Comment: The ADA has defined abnormalities in albumin excretion as follows:           Category           Result                            (  mcg/mg creatinine)                 Normal:    <30       Microalbuminuria:    30 - 299   Clinical albuminuria:    > or = 300   The ADA recommends that at least two of three specimens collected within a 3 - 6 month period be abnormal before considering a patient to be within a diagnostic category.     Abstract on 04/06/2017  Component Date Value Ref Range Status  . HM Diabetic Eye Exam 04/06/2017 No Retinopathy  No Retinopathy Final   Retina and Diabetic [place] [phone]    No results found.   Assessment/Plan   ICD-10-CM   1. Controlled type 2 diabetes mellitus without complication, without long-term current use of insulin (HCC) E11.9 BMP with eGFR    ALT    Hemoglobin A1c  2. Essential hypertension I10 amLODipine (NORVASC) 5 MG tablet    carvedilol (COREG) 25 MG tablet  3. Hyperlipidemia LDL goal <100 E78.5 Lipid Panel  4. Chronic seasonal allergic rhinitis due to pollen J30.1   5. Extrinsic asthma, unspecified asthma severity, uncomplicated V78.469   6. Obesity (BMI 30-39.9) E66.9   7. Advanced directives, counseling/discussion Z71.89     Ok to start [employer] plan as directed  Continue current medications as ordered  Follow up with specialists as scheduled  Advanced directives form given  Follow up in 3 mos for DM, HTN, hyperlipidemia.  Fasting labs prior to appt   [place] S. Perlie Gold  [facility] [address] [place], [place] [address] [phone] Cell (Monday-Friday 8 AM - 5 PM) [phone] After 5 PM and follow prompts

## 2017-06-10 DIAGNOSIS — H353131 Nonexudative age-related macular degeneration, bilateral, early dry stage: Secondary | ICD-10-CM | POA: Diagnosis not present

## 2017-06-10 DIAGNOSIS — H35371 Puckering of macula, right eye: Secondary | ICD-10-CM | POA: Diagnosis not present

## 2017-06-10 LAB — HM DIABETES EYE EXAM

## 2017-06-20 ENCOUNTER — Encounter: Payer: Self-pay | Admitting: Internal Medicine

## 2017-06-20 LAB — HM DIABETES EYE EXAM

## 2017-06-30 DIAGNOSIS — H3341 Traction detachment of retina, right eye: Secondary | ICD-10-CM | POA: Diagnosis not present

## 2017-06-30 DIAGNOSIS — H3521 Other non-diabetic proliferative retinopathy, right eye: Secondary | ICD-10-CM | POA: Diagnosis not present

## 2017-06-30 DIAGNOSIS — H35371 Puckering of macula, right eye: Secondary | ICD-10-CM | POA: Diagnosis not present

## 2017-07-01 ENCOUNTER — Encounter: Payer: Self-pay | Admitting: Internal Medicine

## 2017-07-01 LAB — HM DIABETES EYE EXAM

## 2017-07-07 ENCOUNTER — Encounter: Payer: Self-pay | Admitting: Internal Medicine

## 2017-07-07 LAB — HM DIABETES EYE EXAM

## 2017-07-11 ENCOUNTER — Other Ambulatory Visit: Payer: Self-pay | Admitting: *Deleted

## 2017-07-11 MED ORDER — EZETIMIBE 10 MG PO TABS: 10.0000 mg | ORAL_TABLET | Freq: Every day | ORAL | 0 refills | Status: DC

## 2017-07-11 NOTE — Telephone Encounter (Signed)
Patient requested a local Rx until she is able to get Mail order.

## 2017-07-28 ENCOUNTER — Encounter: Payer: Self-pay | Admitting: Internal Medicine

## 2017-07-28 LAB — HM DIABETES EYE EXAM

## 2017-08-08 ENCOUNTER — Encounter: Payer: Self-pay | Admitting: Internal Medicine

## 2017-08-08 DIAGNOSIS — H33011 Retinal detachment with single break, right eye: Secondary | ICD-10-CM | POA: Diagnosis not present

## 2017-08-08 DIAGNOSIS — H3341 Traction detachment of retina, right eye: Secondary | ICD-10-CM | POA: Diagnosis not present

## 2017-08-08 DIAGNOSIS — H35351 Cystoid macular degeneration, right eye: Secondary | ICD-10-CM | POA: Diagnosis not present

## 2017-08-08 LAB — HM DIABETES EYE EXAM

## 2017-09-02 ENCOUNTER — Encounter: Payer: Self-pay | Admitting: Internal Medicine

## 2017-09-02 ENCOUNTER — Other Ambulatory Visit: Payer: Medicare Other

## 2017-09-02 DIAGNOSIS — Z09 Encounter for follow-up examination after completed treatment for conditions other than malignant neoplasm: Secondary | ICD-10-CM | POA: Diagnosis not present

## 2017-09-02 DIAGNOSIS — E119 Type 2 diabetes mellitus without complications: Secondary | ICD-10-CM

## 2017-09-02 DIAGNOSIS — E785 Hyperlipidemia, unspecified: Secondary | ICD-10-CM | POA: Diagnosis not present

## 2017-09-02 DIAGNOSIS — H35351 Cystoid macular degeneration, right eye: Secondary | ICD-10-CM | POA: Diagnosis not present

## 2017-09-02 LAB — HM DIABETES EYE EXAM

## 2017-09-03 LAB — BASIC METABOLIC PANEL WITH GFR
BUN: 12 mg/dL (ref 7–25)
CALCIUM: 9.2 mg/dL (ref 8.6–10.4)
CO2: 27 mmol/L (ref 20–32)
CREATININE: 0.73 mg/dL (ref 0.60–0.93)
Chloride: 104 mmol/L (ref 98–110)
GFR, EST AFRICAN AMERICAN: 93 mL/min/{1.73_m2} (ref >=60)
GFR, EST NON AFRICAN AMERICAN: 81 mL/min/{1.73_m2} (ref >=60)
Glucose, Bld: 132 mg/dL — ABNORMAL HIGH (ref 65–99)
Potassium: 3.9 mmol/L (ref 3.5–5.3)
Sodium: 139 mmol/L (ref 135–146)

## 2017-09-03 LAB — LIPID PANEL
CHOL/HDL RATIO: 2.9 (calc) (ref <5.0)
CHOLESTEROL: 178 mg/dL (ref <200)
HDL: 62 mg/dL (ref >50)
LDL CHOLESTEROL (CALC): 101 mg/dL — AB
NON-HDL CHOLESTEROL (CALC): 116 mg/dL (ref <130)
TRIGLYCERIDES: 67 mg/dL (ref <150)

## 2017-09-03 LAB — HEMOGLOBIN A1C
HEMOGLOBIN A1C: 6.2 %{Hb} — AB (ref <5.7)
Mean Plasma Glucose: 131 (calc)
eAG (mmol/L): 7.3 (calc)

## 2017-09-03 LAB — ALT: ALT: 12 U/L (ref 6–29)

## 2017-09-06 ENCOUNTER — Ambulatory Visit (INDEPENDENT_AMBULATORY_CARE_PROVIDER_SITE_OTHER): Payer: Medicare Other | Admitting: Internal Medicine

## 2017-09-06 ENCOUNTER — Encounter: Payer: Self-pay | Admitting: Internal Medicine

## 2017-09-06 ENCOUNTER — Telehealth: Payer: Self-pay | Admitting: *Deleted

## 2017-09-06 VITALS — BP 140/62 | HR 57 | Temp 98.1°F | Resp 20 | Ht 61.0 in | Wt 162.4 lb

## 2017-09-06 DIAGNOSIS — L209 Atopic dermatitis, unspecified: Secondary | ICD-10-CM | POA: Diagnosis not present

## 2017-09-06 DIAGNOSIS — H539 Unspecified visual disturbance: Secondary | ICD-10-CM | POA: Diagnosis not present

## 2017-09-06 DIAGNOSIS — J301 Allergic rhinitis due to pollen: Secondary | ICD-10-CM | POA: Diagnosis not present

## 2017-09-06 DIAGNOSIS — Z23 Encounter for immunization: Secondary | ICD-10-CM | POA: Diagnosis not present

## 2017-09-06 DIAGNOSIS — I10 Essential (primary) hypertension: Secondary | ICD-10-CM | POA: Diagnosis not present

## 2017-09-06 DIAGNOSIS — J45909 Unspecified asthma, uncomplicated: Secondary | ICD-10-CM

## 2017-09-06 DIAGNOSIS — E785 Hyperlipidemia, unspecified: Secondary | ICD-10-CM

## 2017-09-06 DIAGNOSIS — E119 Type 2 diabetes mellitus without complications: Secondary | ICD-10-CM

## 2017-09-06 MED ORDER — LOSARTAN POTASSIUM 100 MG PO TABS: 100.0000 mg | ORAL_TABLET | Freq: Every day | ORAL | 0 refills | Status: DC

## 2017-09-06 MED ORDER — AMLODIPINE BESYLATE 5 MG PO TABS: 5.0000 mg | ORAL_TABLET | Freq: Every day | ORAL | 3 refills | Status: DC

## 2017-09-06 MED ORDER — ROSUVASTATIN CALCIUM 20 MG PO TABS: ORAL_TABLET | ORAL | 3 refills | Status: DC

## 2017-09-06 MED ORDER — LOSARTAN POTASSIUM 100 MG PO TABS: 100.0000 mg | ORAL_TABLET | Freq: Every day | ORAL | 3 refills | Status: DC

## 2017-09-06 NOTE — Progress Notes (Signed)
Patient ID: Tanya Bell, female   DOB: 09-30-1941, 76 y.o.   MRN: [id number]   Location:  [facility]  Provider: Dr. Eulas Post  Code Status:  Goals of Care:  Advanced Directives 09/06/2017  Does Patient Have a Medical Advance Directive? No  Does patient want to make changes to medical advance directive? -  Would patient like information on creating a medical advance directive? No - Patient declined     Chief Complaint  Patient presents with  . Medical Management of Chronic Issues    3 mo f/u w/labs    HPI: Patient is a 76 y.o. female seen today for 3 month follow up of DM, HTN, and Hyperlipidemia. Patient states she was unable to start diet due to eye surgery that she had 1 month ago. She does not remember what the surgery was but it was for a pulling sensation at the back of the right eye. She is being followed by Dr. Zadie Rhine and was last seen last week.  Hypertension - She states that her losartan-hctz was making her urinate all the time. She has not taken her BP medication since last visit in June.  Hyperlipidemia - Patient states she does not eat fried foods anymore, but has not been exercising. She states she as been taking her coreg regularly.   DM - Patient states she has been taking her metformin, but has not had any issues with the medication. She states that after breakfast she has some loose BM, but nothing too bad.     Past Medical History:  Diagnosis Date  . Acute upper respiratory infections of unspecified site   . Acute upper respiratory infections of unspecified site   . Acute upper respiratory infections of unspecified site   . Allergic rhinitis due to pollen   . Candidiasis of mouth   . Disorder of bone and cartilage, unspecified   . Diverticulitis of colon (without mention of hemorrhage)(562.11)   . Essential hypertension, benign   . Essential hypertension, benign   . Extrinsic asthma, unspecified   . Herpes simplex with other ophthalmic complications     . Other abnormal glucose   . Other and unspecified hyperlipidemia   . Other and unspecified hyperlipidemia   . Other and unspecified hyperlipidemia   . Other cataract   . Other cataract   . Other dystrophy of vulva   . Other malaise and fatigue   . Rash and other nonspecific skin eruption   . Reflux esophagitis   . Thrombocytopenia, unspecified ([place])   . Type II or unspecified type diabetes mellitus without mention of complication, not stated as uncontrolled   . Type II or unspecified type diabetes mellitus without mention of complication, not stated as uncontrolled   . Unspecified disorder of skin and subcutaneous tissue   . Unspecified essential hypertension   . Unspecified essential hypertension     Past Surgical History:  Procedure Laterality Date  . ABDOMINAL HYSTERECTOMY    . CESAREAN SECTION     [phone]  . CYST EXCISION  1974   Pilonidal Cyst excision  . EYE SURGERY Right 04/14/2017   Dr. Zadie Rhine  . KNEE ARTHROSCOPY Right    for meniscal tear    Allergies  Allergen Reactions  . Sulfa Antibiotics     Outpatient Encounter Prescriptions as of 09/06/2017  Medication Sig  . acetaminophen (TYLENOL) 325 MG tablet Take 325 mg by mouth daily as needed. For pain  . albuterol (VENTOLIN HFA) 108 (90 Base) MCG/ACT inhaler  Inhale 2 puffs into the lungs every 6 (six) hours as needed for wheezing or shortness of breath.  . AMBULATORY NON FORMULARY MEDICATION Compression stockings: to both legs when out of bed, please remove before going to bed at night 1 pair  . aspirin 81 MG tablet Take 81 mg by mouth daily.  . bifidobacterium infantis (ALIGN) capsule Take one tablet once a day  . budesonide-formoterol (SYMBICORT) 160-4.5 MCG/ACT inhaler Inhale two puffs into the lungs twice daily  . calcium-vitamin D (OSCAL WITH D) 500-200 MG-UNIT per tablet Take 1 tablet by mouth 2 (two) times daily.  . carvedilol (COREG) 25 MG tablet TAKE 1 TABLET TWICE A DAY FOR BLOOD PRESSURE   . conjugated estrogens (PREMARIN) vaginal cream Place vaginally daily.  Marland Kitchen ezetimibe (ZETIA) 10 MG tablet Take 1 tablet (10 mg total) by mouth daily.  . fexofenadine (ALLEGRA) 180 MG tablet Take 180 mg by mouth daily.  . hydrocortisone (ANUSOL-HC) 2.5 % rectal cream Place 1 application rectally 2 (two) times daily as needed for hemorrhoids or itching.  . losartan-hydrochlorothiazide (HYZAAR) 100-25 MG tablet TAKE 1 TABLET ONCE A DAY FOR BLOOD PRESSURE  . metFORMIN (GLUCOPHAGE) 500 MG tablet Take 1 tablet (500 mg total) by mouth daily with breakfast.  . mometasone (NASONEX) 50 MCG/ACT nasal spray One spray in each nostril twice daily as needed for allergies.  . Multiple Vitamin (MULTIVITAMIN) tablet Take 1 tablet by mouth daily.  . ranitidine (ZANTAC) 150 MG tablet Take 150 mg by mouth. Take one tablet by mouth daily in the morning.  . tiotropium (SPIRIVA) 18 MCG inhalation capsule Place 1 capsule (18 mcg total) into inhaler and inhale daily.  . vitamin C (ASCORBIC ACID) 500 MG tablet Take 500 mg by mouth daily.  . [DISCONTINUED] amLODipine (NORVASC) 5 MG tablet Take 1 tablet (5 mg total) by mouth daily.  . [DISCONTINUED] rosuvastatin (CRESTOR) 20 MG tablet Take one tablet three times a week, Monday, Wednesday and Friday  . amLODipine (NORVASC) 5 MG tablet Take 1 tablet (5 mg total) by mouth daily.  . rosuvastatin (CRESTOR) 20 MG tablet Take one tablet three times a week, Monday, Wednesday and Friday   No facility-administered encounter medications on file as of 09/06/2017.     Review of Systems:  Review of Systems  Constitutional: Negative for activity change, appetite change, chills and unexpected weight change.  HENT: Positive for postnasal drip and sneezing. Negative for congestion, dental problem, ear discharge, ear pain, facial swelling, mouth sores, nosebleeds, rhinorrhea, sinus pain, sinus pressure, sore throat, tinnitus and trouble swallowing.   Eyes: Negative for photophobia, pain,  discharge, redness, itching and visual disturbance.  Respiratory: Negative for apnea, cough, choking, chest tightness, shortness of breath, wheezing and stridor.   Cardiovascular: Negative for chest pain, palpitations and leg swelling.  Gastrointestinal: Negative for abdominal distention, abdominal pain, anal bleeding, blood in stool, constipation, diarrhea, nausea, rectal pain and vomiting.  Endocrine: Negative for cold intolerance, heat intolerance, polydipsia, polyphagia and polyuria.  Genitourinary: Negative for decreased urine volume, difficulty urinating, dyspareunia, dysuria, enuresis, flank pain, frequency, genital sores, hematuria, urgency and vaginal pain.  Musculoskeletal: Negative for arthralgias, back pain, gait problem, joint swelling, myalgias, neck pain and neck stiffness.  Skin: Positive for rash. Negative for color change, pallor and wound.  Allergic/Immunologic: Negative for environmental allergies, food allergies and immunocompromised state.  Neurological: Negative for dizziness, syncope, speech difficulty, weakness, light-headedness and numbness.  Hematological: Negative for adenopathy. Does not bruise/bleed easily.  Psychiatric/Behavioral: Negative for agitation, behavioral problems,  confusion, decreased concentration and sleep disturbance. The patient is not nervous/anxious.     Health Maintenance  Topic Date Due  . INFLUENZA VACCINE  07/13/2017  . FOOT EXAM  10/29/2017  . HEMOGLOBIN A1C  03/02/2018  . OPHTHALMOLOGY EXAM  06/10/2018  . TETANUS/TDAP  02/16/2022  . COLONOSCOPY  12/21/2025  . DEXA SCAN  Completed  . PNA vac Low Risk Adult  Completed    Physical Exam: Vitals:   09/06/17 0813  BP: 140/62  Pulse: (!) 57  Resp: 20  Temp: 98.1 F (36.7 C)  TempSrc: Oral  SpO2: 94%  Weight: 162 lb 6.4 oz (73.7 kg)  Height: 5\' 1"  (1.549 m)   Body mass index is 30.69 kg/m. Physical Exam  Constitutional: She is oriented to person, place, and time. She appears  well-developed and well-nourished. No distress.  HENT:  Head: Normocephalic and atraumatic.  Right Ear: External ear normal.  Left Ear: External ear normal.  Nose: No rhinorrhea.  Mouth/Throat: Oropharynx is clear and moist. No oropharyngeal exudate.  Eyes: Pupils are equal, round, and reactive to light. Conjunctivae and EOM are normal. Right eye exhibits no discharge. Left eye exhibits no discharge. No scleral icterus.  Neck: Normal range of motion. Neck supple. No JVD present. No tracheal deviation present.  Cardiovascular: Normal rate, regular rhythm, normal heart sounds and intact distal pulses.  Exam reveals no gallop and no friction rub.   No murmur heard. Pulmonary/Chest: Effort normal. No stridor. No respiratory distress. She has no wheezes. She has no rales. She exhibits no tenderness.  Abdominal: Soft. Bowel sounds are normal. She exhibits no distension and no mass. There is no tenderness. There is no rebound and no guarding.  Lymphadenopathy:    She has no cervical adenopathy.  Neurological: She is alert and oriented to person, place, and time. Coordination normal.  Skin: Skin is warm and dry. No rash noted. She is not diaphoretic. No erythema. No pallor.  Psychiatric: She has a normal mood and affect. Her behavior is normal. Judgment and thought content normal.    Labs reviewed: Basic Metabolic Panel:  Recent Labs  10/26/16 0818 06/06/17 1056 09/02/17 0850  NA 140 141 139  K 4.7 4.6 3.9  CL 103 105 104  CO2 31 27 27   GLUCOSE 139* 113* 132*  BUN 11 12 12   CREATININE 0.68 0.65 0.73  CALCIUM 9.6 9.5 9.2  TSH 2.12 1.69  --    Liver Function Tests:  Recent Labs  10/26/16 0818 06/06/17 1056 09/02/17 0850  AST 13 13  --   ALT 12 13 12   ALKPHOS 55 61  --   BILITOT 0.5 0.5  --   PROT 6.9 7.2  --   ALBUMIN 3.8 3.9  --    No results for input(s): LIPASE, AMYLASE in the last 8760 hours. No results for input(s): AMMONIA in the last 8760 hours. CBC:  Recent  Labs  10/26/16 0818 06/06/17 1056  WBC 7.5 5.1  NEUTROABS 2,625 1,632  HGB 13.0 13.6  HCT 39.4 41.5  MCV 78.2* 78.3*  PLT 198 190   Lipid Panel:  Recent Labs  10/26/16 0818 06/06/17 1056 09/02/17 0850  CHOL 173 140 178  HDL 54 59 62  LDLCALC 106* 71  --   TRIG 64 52 67  CHOLHDL 3.2 2.4 2.9   Lab Results  Component Value Date   HGBA1C 6.2 (H) 09/02/2017    Procedures since last visit: No results found.  Assessment/Plan 1. Essential hypertension  Begin taking losartan 100 mg one tablet once a day.  Continue regular medications.  Return in 2 weeks for blood pressure check in office.  Fasting labs before next visit: BMP, CBC, A1c, Lipid  Follow up in 3 months.  - amLODipine (NORVASC) 5 MG tablet; Take 1 tablet (5 mg total) by mouth daily.  Dispense: 90 tablet; Refill: 3    Labs/tests ordered:   Next appt:  Visit date not found   Brityn Mastrogiovanni C. Dayon Witt, Student AGACNP

## 2017-09-06 NOTE — Progress Notes (Signed)
Patient ID: Tanya Bell, female   DOB: 1941-07-11, 76 y.o.   MRN: [id number]    Location:  [facility] Place of Service: OFFICE  Chief Complaint  Patient presents with  . Medical Management of Chronic Issues    3 mo f/u w/labs    HPI:  76 yo female seen today for f/u. She has not taken losartan hct in several weeks as it caused urinary frequency and elevated BS. She never started her Almased diet plan. No wt loss. She underwent OD sx by Dr Zadie Rhine due to "a pulling on eye". Eye exam in June 2018 showed no diabetic eye disease. She has not been exercising as a result.  DM - no low BS reactions on metformin. A1c 6.2%. BS at home fluctuating but believes related to meter. She has ariva meter. Urine microalbumin/Cr ratio 22. LDL 101  HTN - BP controlled on losartan HCT, amlodipine, coreg. She also takes ASA daily  Hyperlipidemia - no myalgias. Takes crestor 1/2 tab on MWF due to previous rxn of myalgias and daily zetia. LDL 101 with Total chol 178. She has missed several doses of crestor  Seasonal allergy/asthma - no exacerbations. Last PFTs several years ago in [place]. Stable on nasonex. She uses spiriva, symbicort, proventil hfa  Vaginal dryness - stable on premarin cream  GERD - stable on zantac. Takes align. She did not like the way levsin made her feel (tongue felt numb) and she stopped taking it   Skin lesions - she saw derm in Jan 2017 and dx with SK, seborrhea dermatitis and cherry angioma. She was rx ketoconazole 2% crm to apply to cheek area which helped.   She is UTD on vaccinations. She has declined zostavax in the past but willing to proceed with vaccine  Past Medical History:  Diagnosis Date  . Acute upper respiratory infections of unspecified site   . Acute upper respiratory infections of unspecified site   . Acute upper respiratory infections of unspecified site   . Allergic rhinitis due to pollen   . Candidiasis of mouth   . Disorder of bone and cartilage, unspecified     . Diverticulitis of colon (without mention of hemorrhage)(562.11)   . Essential hypertension, benign   . Essential hypertension, benign   . Extrinsic asthma, unspecified   . Herpes simplex with other ophthalmic complications   . Other abnormal glucose   . Other and unspecified hyperlipidemia   . Other and unspecified hyperlipidemia   . Other and unspecified hyperlipidemia   . Other cataract   . Other cataract   . Other dystrophy of vulva   . Other malaise and fatigue   . Rash and other nonspecific skin eruption   . Reflux esophagitis   . Thrombocytopenia, unspecified ([place])   . Type II or unspecified type diabetes mellitus without mention of complication, not stated as uncontrolled   . Type II or unspecified type diabetes mellitus without mention of complication, not stated as uncontrolled   . Unspecified disorder of skin and subcutaneous tissue   . Unspecified essential hypertension   . Unspecified essential hypertension     Past Surgical History:  Procedure Laterality Date  . ABDOMINAL HYSTERECTOMY    . CESAREAN SECTION     [phone]  . CYST EXCISION  1974   Pilonidal Cyst excision  . EYE SURGERY Right 04/14/2017   Dr. Zadie Rhine  . KNEE ARTHROSCOPY Right    for meniscal tear    Patient Care Team: Gildardo Cranker, DO as  PCP - General (Internal Medicine) Teena Irani, MD as Consulting Physician (Gastroenterology) Warden Fillers, MD as Consulting Physician (Ophthalmology) [name], MD as Consulting Physician (Ophthalmology) Mickel Fuchs, MD as Referring Physician (Psychiatry)  Social History   Social History  . Marital status: Married    Spouse name: N/A  . Number of children: N/A  . Years of education: N/A   Occupational History  . Not on file.   Social History Main Topics  . Smoking status: Former Smoker    Types: Cigarettes    Quit date: 12/13/1990  . Smokeless tobacco: Never Used  . Alcohol use No  . Drug use: No  . Sexual activity: Yes     Partners: Male     Comment: None    Other Topics Concern  . Not on file   Social History Narrative  . No narrative on file     reports that she quit smoking about 26 years ago. Her smoking use included Cigarettes. She has never used smokeless tobacco. She reports that she does not drink alcohol or use drugs.  Family History  Problem Relation Age of Onset  . Cancer Mother        brain  . Stroke Father    Family Status  Relation Status  . Mother Deceased  . Father Deceased  . Sister Alive  . Brother Alive  . Daughter Alive  . Son Alive  . Daughter Alive  . Daughter Alive     Allergies  Allergen Reactions  . Sulfa Antibiotics     Medications: Patient's Medications  New Prescriptions   No medications on file  Previous Medications   ACETAMINOPHEN (TYLENOL) 325 MG TABLET    Take 325 mg by mouth daily as needed. For pain   ALBUTEROL (VENTOLIN HFA) 108 (90 BASE) MCG/ACT INHALER    Inhale 2 puffs into the lungs every 6 (six) hours as needed for wheezing or shortness of breath.   AMBULATORY NON FORMULARY MEDICATION    Compression stockings: to both legs when out of bed, please remove before going to bed at night 1 pair   ASPIRIN 81 MG TABLET    Take 81 mg by mouth daily.   BIFIDOBACTERIUM INFANTIS (ALIGN) CAPSULE    Take one tablet once a day   BUDESONIDE-FORMOTEROL (SYMBICORT) 160-4.5 MCG/ACT INHALER    Inhale two puffs into the lungs twice daily   CALCIUM-VITAMIN D (OSCAL WITH D) 500-200 MG-UNIT PER TABLET    Take 1 tablet by mouth 2 (two) times daily.   CARVEDILOL (COREG) 25 MG TABLET    TAKE 1 TABLET TWICE A DAY FOR BLOOD PRESSURE   CONJUGATED ESTROGENS (PREMARIN) VAGINAL CREAM    Place vaginally daily.   EZETIMIBE (ZETIA) 10 MG TABLET    Take 1 tablet (10 mg total) by mouth daily.   FEXOFENADINE (ALLEGRA) 180 MG TABLET    Take 180 mg by mouth daily.   HYDROCORTISONE (ANUSOL-HC) 2.5 % RECTAL CREAM    Place 1 application rectally 2 (two) times daily as needed for  hemorrhoids or itching.   LOSARTAN-HYDROCHLOROTHIAZIDE (HYZAAR) 100-25 MG TABLET    TAKE 1 TABLET ONCE A DAY FOR BLOOD PRESSURE   METFORMIN (GLUCOPHAGE) 500 MG TABLET    Take 1 tablet (500 mg total) by mouth daily with breakfast.   MOMETASONE (NASONEX) 50 MCG/ACT NASAL SPRAY    One spray in each nostril twice daily as needed for allergies.   MULTIPLE VITAMIN (MULTIVITAMIN) TABLET    Take 1 tablet by  mouth daily.   RANITIDINE (ZANTAC) 150 MG TABLET    Take 150 mg by mouth. Take one tablet by mouth daily in the morning.   TIOTROPIUM (SPIRIVA) 18 MCG INHALATION CAPSULE    Place 1 capsule (18 mcg total) into inhaler and inhale daily.   VITAMIN C (ASCORBIC ACID) 500 MG TABLET    Take 500 mg by mouth daily.  Modified Medications   Modified Medication Previous Medication   AMLODIPINE (NORVASC) 5 MG TABLET amLODipine (NORVASC) 5 MG tablet      Take 1 tablet (5 mg total) by mouth daily.    Take 1 tablet (5 mg total) by mouth daily.   ROSUVASTATIN (CRESTOR) 20 MG TABLET rosuvastatin (CRESTOR) 20 MG tablet      Take one tablet three times a week, Monday, Wednesday and Friday    Take one tablet three times a week, Monday, Wednesday and Friday  Discontinued Medications   No medications on file    Review of Systems  Constitutional: Negative for fatigue.  HENT: Negative for congestion.   Respiratory: Negative for cough.   Cardiovascular: Negative for chest pain.  Neurological: Negative for numbness.  All other systems reviewed and are negative.   Vitals:   09/06/17 0813  BP: 140/62  Pulse: (!) 57  Resp: 20  Temp: 98.1 F (36.7 C)  TempSrc: Oral  SpO2: 94%  Weight: 162 lb 6.4 oz (73.7 kg)  Height: _0  (1.549 m)   Body mass index is 30.69 kg/m.  Physical Exam  Constitutional: She is oriented to person, place, and time. She appears well-developed and well-nourished.  HENT:  Mouth/Throat: Oropharynx is clear and moist. No oropharyngeal exudate.  MMM; no oral thrush  Eyes: Pupils are  equal, round, and reactive to light. No scleral icterus.  Neck: Neck supple. Carotid bruit is not present. No tracheal deviation present. No thyromegaly present.  Cardiovascular: Normal rate, regular rhythm, normal heart sounds and intact distal pulses.  Exam reveals no gallop and no friction rub.   No murmur heard. No LE edema b/l. no calf TTP.   Pulmonary/Chest: Effort normal and breath sounds normal. No stridor. No respiratory distress. She has no wheezes. She has no rales.  Abdominal: Soft. Normal appearance and bowel sounds are normal. She exhibits no distension and no mass. There is no hepatomegaly. There is tenderness (LLQ). There is no rigidity, no rebound and no guarding. No hernia.  Musculoskeletal: She exhibits edema.  Lymphadenopathy:    She has no cervical adenopathy.  Neurological: She is alert and oriented to person, place, and time.  Skin: Skin is warm and dry. Rash (anterior patchy red rash; no vesicles) noted.  Psychiatric: She has a normal mood and affect. Her behavior is normal. Judgment and thought content normal.   Diabetic Foot Exam - Simple   Simple Foot Form Diabetic Foot exam was performed with the following findings:  Yes 09/06/2017  9:19 AM  Visual Inspection See comments:  Yes Sensation Testing Intact to touch and monofilament testing bilaterally:  Yes Pulse Check Posterior Tibialis and Dorsalis pulse intact bilaterally:  Yes Comments Calluses noted on R>L plantar but no ulcerations;        Labs reviewed: Appointment on 09/02/2017  Component Date Value Ref Range Status  . Glucose, Bld 09/02/2017 132* 65 - 99 mg/dL Final   Comment: .            Fasting reference interval . For someone without known diabetes, a glucose value >125 mg/dL indicates that  they may have diabetes and this should be confirmed with a follow-up test. .   . BUN 09/02/2017 12  7 - 25 mg/dL Final  . Creat 09/02/2017 0.73  0.60 - 0.93 mg/dL Final   Comment: For patients >73  years of age, the reference limit for Creatinine is approximately 13% higher for people identified as African-American. .   . GFR, Est Non African American 09/02/2017 81  > OR = 60 mL/min/1.38m Final  . GFR, Est African American 09/02/2017 93  > OR = 60 mL/min/1.723mFinal  . BUN/Creatinine Ratio 0963/33/5456OT APPLICABLE  6 - 22 (calc) Final  . Sodium 09/02/2017 139  135 - 146 mmol/L Final  . Potassium 09/02/2017 3.9  3.5 - 5.3 mmol/L Final  . Chloride 09/02/2017 104  98 - 110 mmol/L Final  . CO2 09/02/2017 27  20 - 32 mmol/L Final  . Calcium 09/02/2017 9.2  8.6 - 10.4 mg/dL Final  . ALT 09/02/2017 12  6 - 29 U/L Final  . Cholesterol 09/02/2017 178  <200 mg/dL Final  . HDL 09/02/2017 62  >50 mg/dL Final  . Triglycerides 09/02/2017 67  <150 mg/dL Final  . LDL Cholesterol (Calc) 09/02/2017 101* mg/dL (calc) Final   Comment: Reference range: <100 . Desirable range <100 mg/dL for primary prevention;   <70 mg/dL for patients with CHD or diabetic patients  with > or = 2 CHD risk factors. . Marland KitchenDL-C is now calculated using the Martin-Hopkins  calculation, which is a validated novel method providing  better accuracy than the Friedewald equation in the  estimation of LDL-C.  MaCresenciano Genret al. JAAnnamaria [id number]  (http://education.[facility].com/faq/FAQ164)   . Total CHOL/HDL Ratio 09/02/2017 2.9  <5.0 (calc) Final  . Non-HDL Cholesterol (Calc) 09/02/2017 116  <130 mg/dL (calc) Final   Comment: For patients with diabetes plus 1 major ASCVD risk  factor, treating to a non-HDL-C goal of <100 mg/dL  (LDL-C of <70 mg/dL) is considered a therapeutic  option.   . Hgb A1c MFr Bld 09/02/2017 6.2* <5.7 % of total Hgb Final   Comment: For someone without known diabetes, a hemoglobin  A1c value between 5.7% and 6.4% is consistent with prediabetes and should be confirmed with a  follow-up test. . For someone with known diabetes, a value <7% indicates that their diabetes is well  controlled. A1c targets should be individualized based on duration of diabetes, age, comorbid conditions, and other considerations. . This assay result is consistent with an increased risk of diabetes. . Currently, no consensus exists regarding use of hemoglobin A1c for diagnosis of diabetes for children. .   . Mean Plasma Glucose 09/02/2017 131  (calc) Final  . eAG (mmol/L) 09/02/2017 7.3  (calc) Final  Abstract on 06/21/2017  Component Date Value Ref Range Status  . HM Diabetic Eye Exam 06/10/2017 No Retinopathy  No Retinopathy Final   [facility]    No results found.   Assessment/Plan   ICD-10-CM   1. Essential hypertension I10 amLODipine (NORVASC) 5 MG tablet    losartan (COZAAR) 100 MG tablet  2. Controlled type 2 diabetes mellitus without complication, without long-term current use of insulin (HCC) E11.9 BMP with eGFR    Hemoglobin A1c    ALT  3. Atopic dermatitis, unspecified type L20.9   4. Hyperlipidemia LDL goal <100 E78.5 Lipid Panel  5. Seasonal allergic rhinitis due to pollen J30.1   6. Extrinsic asthma, unspecified asthma severity, uncomplicated [id number] 7. Change in  vision H53.9    OD  8. Need for immunization against influenza Z23 Flu Vaccine QUAD 36+ mos IM   Get last OV note from Dr Zadie Rhine  START LOSARTAN 100MG DAILY and stop losartan HCT  Continue other medications as ordered  May continue topical agent to eczema sites  Follow up with specialists as scheduled  Influenza vaccine given today  Recommend you not get shingles vaccine until eye issue resolves   Follow up in 3 mos for HTN,DM, hyperlipidemia. Fasting labs prior to appt    [place] S. Perlie Gold  [facility] [address] [place], [place] [address] [phone] Cell (Monday-Friday 8 AM - 5 PM) [phone] After 5 PM and follow prompts

## 2017-09-06 NOTE — Patient Instructions (Addendum)
Get last OV note from Dr Zadie Rhine  START LOSARTAN 100MG  DAILY and stop losartan HCT  Continue other medications as ordered  May continue topical agent to eczema sites  Follow up with specialists as scheduled  Influenza vaccine given today  Recommend you not get shingles vaccine until eye issue resolves   Follow up in 3 mos for HTN,DM, hyperlipidemia. Fasting labs prior to appt

## 2017-09-06 NOTE — Telephone Encounter (Signed)
Called patient regarding her losartan script, need to know which [facility] to send it.

## 2017-09-07 NOTE — Telephone Encounter (Signed)
Patient called back, rx to go to [facility].  I updated patient's pharmacy list.   RX faxed by Gabriel Cirri

## 2017-09-08 ENCOUNTER — Encounter: Payer: Self-pay | Admitting: *Deleted

## 2017-09-15 ENCOUNTER — Other Ambulatory Visit: Payer: Self-pay | Admitting: *Deleted

## 2017-09-15 DIAGNOSIS — I10 Essential (primary) hypertension: Secondary | ICD-10-CM

## 2017-09-15 MED ORDER — CARVEDILOL 25 MG PO TABS: ORAL_TABLET | ORAL | 0 refills | Status: DC

## 2017-09-15 NOTE — Telephone Encounter (Signed)
Patient requested a 30 day supply while waiting for mail order.

## 2017-09-20 ENCOUNTER — Telehealth: Payer: Self-pay | Admitting: Internal Medicine

## 2017-09-20 NOTE — Telephone Encounter (Signed)
I left a message asking the patient to confirm whether or not she can make this AWV-S appt w/ nurse on same day as labs. VDM (DD)

## 2017-09-29 ENCOUNTER — Other Ambulatory Visit: Payer: Self-pay | Admitting: Internal Medicine

## 2017-09-29 DIAGNOSIS — H33011 Retinal detachment with single break, right eye: Secondary | ICD-10-CM | POA: Diagnosis not present

## 2017-09-29 DIAGNOSIS — H3341 Traction detachment of retina, right eye: Secondary | ICD-10-CM | POA: Diagnosis not present

## 2017-09-29 DIAGNOSIS — J45909 Unspecified asthma, uncomplicated: Secondary | ICD-10-CM

## 2017-09-29 DIAGNOSIS — H35351 Cystoid macular degeneration, right eye: Secondary | ICD-10-CM | POA: Diagnosis not present

## 2017-09-29 DIAGNOSIS — H35371 Puckering of macula, right eye: Secondary | ICD-10-CM | POA: Diagnosis not present

## 2017-09-30 ENCOUNTER — Other Ambulatory Visit: Payer: Self-pay | Admitting: *Deleted

## 2017-09-30 DIAGNOSIS — J45909 Unspecified asthma, uncomplicated: Secondary | ICD-10-CM

## 2017-09-30 MED ORDER — BUDESONIDE-FORMOTEROL FUMARATE 160-4.5 MCG/ACT IN AERO: INHALATION_SPRAY | RESPIRATORY_TRACT | 1 refills | Status: DC

## 2017-09-30 NOTE — Telephone Encounter (Signed)
Patient requested local Rx sent to [facility]

## 2017-10-13 DIAGNOSIS — H35351 Cystoid macular degeneration, right eye: Secondary | ICD-10-CM | POA: Diagnosis not present

## 2017-10-13 DIAGNOSIS — H3521 Other non-diabetic proliferative retinopathy, right eye: Secondary | ICD-10-CM | POA: Diagnosis not present

## 2017-10-13 DIAGNOSIS — E119 Type 2 diabetes mellitus without complications: Secondary | ICD-10-CM | POA: Diagnosis not present

## 2017-10-13 DIAGNOSIS — H35372 Puckering of macula, left eye: Secondary | ICD-10-CM | POA: Diagnosis not present

## 2017-10-13 DIAGNOSIS — H33029 Retinal detachment with multiple breaks, unspecified eye: Secondary | ICD-10-CM | POA: Diagnosis not present

## 2017-10-13 DIAGNOSIS — H43822 Vitreomacular adhesion, left eye: Secondary | ICD-10-CM | POA: Diagnosis not present

## 2017-10-21 DIAGNOSIS — H26491 Other secondary cataract, right eye: Secondary | ICD-10-CM | POA: Diagnosis not present

## 2017-10-21 DIAGNOSIS — Z91040 Latex allergy status: Secondary | ICD-10-CM | POA: Diagnosis not present

## 2017-10-21 DIAGNOSIS — J45909 Unspecified asthma, uncomplicated: Secondary | ICD-10-CM | POA: Diagnosis not present

## 2017-10-21 DIAGNOSIS — H3321 Serous retinal detachment, right eye: Secondary | ICD-10-CM | POA: Diagnosis not present

## 2017-10-21 DIAGNOSIS — Z882 Allergy status to sulfonamides status: Secondary | ICD-10-CM | POA: Diagnosis not present

## 2017-10-21 DIAGNOSIS — H3341 Traction detachment of retina, right eye: Secondary | ICD-10-CM | POA: Diagnosis not present

## 2017-10-21 DIAGNOSIS — Z961 Presence of intraocular lens: Secondary | ICD-10-CM | POA: Diagnosis not present

## 2017-10-21 DIAGNOSIS — Z79899 Other long term (current) drug therapy: Secondary | ICD-10-CM | POA: Diagnosis not present

## 2017-10-21 DIAGNOSIS — Z87891 Personal history of nicotine dependence: Secondary | ICD-10-CM | POA: Diagnosis not present

## 2017-10-21 DIAGNOSIS — D573 Sickle-cell trait: Secondary | ICD-10-CM | POA: Diagnosis not present

## 2017-10-21 DIAGNOSIS — K219 Gastro-esophageal reflux disease without esophagitis: Secondary | ICD-10-CM | POA: Diagnosis not present

## 2017-10-21 DIAGNOSIS — I10 Essential (primary) hypertension: Secondary | ICD-10-CM | POA: Diagnosis not present

## 2017-10-21 DIAGNOSIS — H3521 Other non-diabetic proliferative retinopathy, right eye: Secondary | ICD-10-CM | POA: Diagnosis not present

## 2017-10-21 DIAGNOSIS — E113531 Type 2 diabetes mellitus with proliferative diabetic retinopathy with traction retinal detachment not involving the macula, right eye: Secondary | ICD-10-CM | POA: Diagnosis not present

## 2017-10-21 HISTORY — PX: RETINAL TEAR REPAIR CRYOTHERAPY: SHX5304

## 2017-11-10 DIAGNOSIS — Z9889 Other specified postprocedural states: Secondary | ICD-10-CM | POA: Diagnosis not present

## 2017-11-10 DIAGNOSIS — Z961 Presence of intraocular lens: Secondary | ICD-10-CM | POA: Diagnosis not present

## 2017-11-10 DIAGNOSIS — E119 Type 2 diabetes mellitus without complications: Secondary | ICD-10-CM | POA: Diagnosis not present

## 2017-11-10 DIAGNOSIS — H35372 Puckering of macula, left eye: Secondary | ICD-10-CM | POA: Diagnosis not present

## 2017-11-10 DIAGNOSIS — H35351 Cystoid macular degeneration, right eye: Secondary | ICD-10-CM | POA: Diagnosis not present

## 2017-11-10 DIAGNOSIS — H3521 Other non-diabetic proliferative retinopathy, right eye: Secondary | ICD-10-CM | POA: Diagnosis not present

## 2017-11-10 DIAGNOSIS — H43822 Vitreomacular adhesion, left eye: Secondary | ICD-10-CM | POA: Diagnosis not present

## 2017-11-17 DIAGNOSIS — H35351 Cystoid macular degeneration, right eye: Secondary | ICD-10-CM | POA: Diagnosis not present

## 2017-11-17 DIAGNOSIS — H35372 Puckering of macula, left eye: Secondary | ICD-10-CM | POA: Diagnosis not present

## 2017-11-17 DIAGNOSIS — E119 Type 2 diabetes mellitus without complications: Secondary | ICD-10-CM | POA: Diagnosis not present

## 2017-11-17 DIAGNOSIS — H3521 Other non-diabetic proliferative retinopathy, right eye: Secondary | ICD-10-CM | POA: Diagnosis not present

## 2017-11-25 ENCOUNTER — Other Ambulatory Visit: Payer: Self-pay | Admitting: *Deleted

## 2017-11-25 MED ORDER — METFORMIN HCL 500 MG PO TABS: 500.0000 mg | ORAL_TABLET | Freq: Every day | ORAL | 3 refills | Status: DC

## 2017-11-25 NOTE — Telephone Encounter (Signed)
Patient requested to be sen to [facility]. Faxed.

## 2017-11-30 ENCOUNTER — Other Ambulatory Visit: Payer: Medicare Other

## 2017-11-30 ENCOUNTER — Ambulatory Visit: Payer: Medicare Other

## 2017-12-01 DIAGNOSIS — Z961 Presence of intraocular lens: Secondary | ICD-10-CM | POA: Diagnosis not present

## 2017-12-01 DIAGNOSIS — H35372 Puckering of macula, left eye: Secondary | ICD-10-CM | POA: Diagnosis not present

## 2017-12-01 DIAGNOSIS — H35351 Cystoid macular degeneration, right eye: Secondary | ICD-10-CM | POA: Diagnosis not present

## 2017-12-01 DIAGNOSIS — Z9889 Other specified postprocedural states: Secondary | ICD-10-CM | POA: Diagnosis not present

## 2017-12-01 DIAGNOSIS — E119 Type 2 diabetes mellitus without complications: Secondary | ICD-10-CM | POA: Diagnosis not present

## 2017-12-02 ENCOUNTER — Ambulatory Visit: Payer: Medicare Other | Admitting: Internal Medicine

## 2017-12-08 DIAGNOSIS — H04121 Dry eye syndrome of right lacrimal gland: Secondary | ICD-10-CM | POA: Diagnosis not present

## 2017-12-08 DIAGNOSIS — Z9842 Cataract extraction status, left eye: Secondary | ICD-10-CM | POA: Diagnosis not present

## 2017-12-08 DIAGNOSIS — H35372 Puckering of macula, left eye: Secondary | ICD-10-CM | POA: Diagnosis not present

## 2017-12-08 DIAGNOSIS — H43822 Vitreomacular adhesion, left eye: Secondary | ICD-10-CM | POA: Diagnosis not present

## 2017-12-08 DIAGNOSIS — E119 Type 2 diabetes mellitus without complications: Secondary | ICD-10-CM | POA: Diagnosis not present

## 2017-12-08 DIAGNOSIS — Z9841 Cataract extraction status, right eye: Secondary | ICD-10-CM | POA: Diagnosis not present

## 2017-12-08 DIAGNOSIS — H3521 Other non-diabetic proliferative retinopathy, right eye: Secondary | ICD-10-CM | POA: Diagnosis not present

## 2017-12-08 DIAGNOSIS — Z7984 Long term (current) use of oral hypoglycemic drugs: Secondary | ICD-10-CM | POA: Diagnosis not present

## 2017-12-08 DIAGNOSIS — H35351 Cystoid macular degeneration, right eye: Secondary | ICD-10-CM | POA: Diagnosis not present

## 2017-12-08 DIAGNOSIS — Z961 Presence of intraocular lens: Secondary | ICD-10-CM | POA: Diagnosis not present

## 2017-12-14 ENCOUNTER — Other Ambulatory Visit: Payer: Self-pay

## 2017-12-14 ENCOUNTER — Other Ambulatory Visit: Payer: Medicare Other

## 2017-12-14 ENCOUNTER — Ambulatory Visit (INDEPENDENT_AMBULATORY_CARE_PROVIDER_SITE_OTHER): Payer: Medicare Other

## 2017-12-14 VITALS — BP 138/68 | HR 50 | Temp 97.8°F | Ht 61.0 in | Wt 156.0 lb

## 2017-12-14 DIAGNOSIS — E785 Hyperlipidemia, unspecified: Secondary | ICD-10-CM

## 2017-12-14 DIAGNOSIS — Z9189 Other specified personal risk factors, not elsewhere classified: Secondary | ICD-10-CM | POA: Diagnosis not present

## 2017-12-14 DIAGNOSIS — Z0000 Encounter for general adult medical examination without abnormal findings: Secondary | ICD-10-CM | POA: Diagnosis not present

## 2017-12-14 DIAGNOSIS — E119 Type 2 diabetes mellitus without complications: Secondary | ICD-10-CM

## 2017-12-14 DIAGNOSIS — Z5982 Transportation insecurity: Secondary | ICD-10-CM

## 2017-12-14 NOTE — Progress Notes (Signed)
Subjective:   Tanya Bell is a 77 y.o. female who presents for Medicare Annual (Subsequent) preventive examination.  Last AWV-10/29/2016       Objective:     Vitals: BP 138/68 (BP Location: Left Arm, Patient Position: Sitting)   Pulse (!) 50   Temp 97.8 F (36.6 C) (Oral)   Ht 5\' 1"  (1.549 m)   Wt 156 lb (70.8 kg)   SpO2 96%   BMI 29.48 kg/m   Body mass index is 29.48 kg/m.  Advanced Directives 12/14/2017 09/06/2017 06/08/2017 10/29/2016 10/29/2016 05/19/2016 02/13/2016  Does Patient Have a Medical Advance Directive? No No No No No No No  Does patient want to make changes to medical advance directive? - - - - - - Yes - information given  Would patient like information on creating a medical advance directive? Yes (MAU/Ambulatory/Procedural Areas - Information given) No - Patient declined No - Patient declined Yes - Scientist, clinical (histocompatibility and immunogenetics) given Yes - Scientist, clinical (histocompatibility and immunogenetics) given No - patient declined information -    Tobacco Social History   Tobacco Use  Smoking Status Former Smoker  . Types: Cigarettes  . Last attempt to quit: 12/13/1990  . Years since quitting: 27.0  Smokeless Tobacco Never Used     Counseling given: Not Answered   Clinical Intake:  Pre-visit preparation completed: No  Pain : No/denies pain     Nutritional Risks: None Diabetes: Yes CBG done?: No Did pt. bring in CBG monitor from home?: No  How often do you need to have someone help you when you read instructions, pamphlets, or other written materials from your doctor or pharmacy?: 1 - Never What is the last grade level you completed in school?: GED  Interpreter Needed?: No  Information entered by :: [name], RN  Past Medical History:  Diagnosis Date  . Acute upper respiratory infections of unspecified site   . Acute upper respiratory infections of unspecified site   . Acute upper respiratory infections of unspecified site   . Allergic rhinitis due to pollen   . Candidiasis of  mouth   . Disorder of bone and cartilage, unspecified   . Diverticulitis of colon (without mention of hemorrhage)(562.11)   . Essential hypertension, benign   . Essential hypertension, benign   . Extrinsic asthma, unspecified   . Herpes simplex with other ophthalmic complications   . Other abnormal glucose   . Other and unspecified hyperlipidemia   . Other and unspecified hyperlipidemia   . Other and unspecified hyperlipidemia   . Other cataract   . Other cataract   . Other dystrophy of vulva   . Other malaise and fatigue   . Rash and other nonspecific skin eruption   . Reflux esophagitis   . Thrombocytopenia, unspecified ([place])   . Type II or unspecified type diabetes mellitus without mention of complication, not stated as uncontrolled   . Type II or unspecified type diabetes mellitus without mention of complication, not stated as uncontrolled   . Unspecified disorder of skin and subcutaneous tissue   . Unspecified essential hypertension   . Unspecified essential hypertension    Past Surgical History:  Procedure Laterality Date  . ABDOMINAL HYSTERECTOMY    . CESAREAN SECTION     [phone]  . CYST EXCISION  1974   Pilonidal Cyst excision  . EYE SURGERY Right 04/14/2017   Dr. Zadie Rhine  . KNEE ARTHROSCOPY Right    for meniscal tear   Family History  Problem Relation Age of Onset  .  Cancer Mother        brain  . Stroke Father    Social History   Socioeconomic History  . Marital status: Married    Spouse name: None  . Number of children: None  . Years of education: None  . Highest education level: None  Social Needs  . Financial resource strain: Not hard at all  . Food insecurity - worry: Never true  . Food insecurity - inability: Never true  . Transportation needs - medical: Yes  . Transportation needs - non-medical: Yes  Occupational History  . None  Tobacco Use  . Smoking status: Former Smoker    Types: Cigarettes    Last attempt to quit: 12/13/1990     Years since quitting: 27.0  . Smokeless tobacco: Never Used  Substance and Sexual Activity  . Alcohol use: No    Alcohol/week: 0.0 oz  . Drug use: No  . Sexual activity: Yes    Partners: Male    Comment: None   Other Topics Concern  . None  Social History Narrative  . None    Outpatient Encounter Medications as of 12/14/2017  Medication Sig  . acetaminophen (TYLENOL) 325 MG tablet Take 325 mg by mouth daily as needed. For pain  . albuterol (VENTOLIN HFA) 108 (90 Base) MCG/ACT inhaler Inhale 2 puffs into the lungs every 6 (six) hours as needed for wheezing or shortness of breath.  . AMBULATORY NON FORMULARY MEDICATION Compression stockings: to both legs when out of bed, please remove before going to bed at night 1 pair  . amLODipine (NORVASC) 5 MG tablet Take 1 tablet (5 mg total) by mouth daily.  Marland Kitchen aspirin 81 MG tablet Take 81 mg by mouth daily.  . bifidobacterium infantis (ALIGN) capsule Take one tablet once a day  . budesonide-formoterol (SYMBICORT) 160-4.5 MCG/ACT inhaler Use 2 inhalations twice daily  . calcium-vitamin D (OSCAL WITH D) 500-200 MG-UNIT per tablet Take 1 tablet by mouth 2 (two) times daily.  . carvedilol (COREG) 25 MG tablet TAKE 1 TABLET TWICE A DAY FOR BLOOD PRESSURE  . conjugated estrogens (PREMARIN) vaginal cream Place vaginally daily.  Marland Kitchen ezetimibe (ZETIA) 10 MG tablet Take 1 tablet (10 mg total) by mouth daily.  . fexofenadine (ALLEGRA) 180 MG tablet Take 180 mg by mouth daily.  . hydrocortisone (ANUSOL-HC) 2.5 % rectal cream Place 1 application rectally 2 (two) times daily as needed for hemorrhoids or itching.  Marland Kitchen KETOROLAC TROMETHAMINE OP Apply 1 drop to eye 4 (four) times daily. Right eye  . losartan (COZAAR) 100 MG tablet Take 1 tablet (100 mg total) by mouth daily.  Marland Kitchen losartan (COZAAR) 100 MG tablet Take 1 tablet (100 mg total) by mouth daily.  . metFORMIN (GLUCOPHAGE) 500 MG tablet Take 1 tablet (500 mg total) by mouth daily with breakfast.  . mometasone  (NASONEX) 50 MCG/ACT nasal spray One spray in each nostril twice daily as needed for allergies.  . Multiple Vitamin (MULTIVITAMIN) tablet Take 1 tablet by mouth daily.  . ranitidine (ZANTAC) 150 MG tablet Take 150 mg by mouth. Take one tablet by mouth daily in the morning.  . rosuvastatin (CRESTOR) 20 MG tablet Take one tablet three times a week, Monday, Wednesday and Friday  . tiotropium (SPIRIVA) 18 MCG inhalation capsule Place 1 capsule (18 mcg total) into inhaler and inhale daily.  . vitamin C (ASCORBIC ACID) 500 MG tablet Take 500 mg by mouth daily.   No facility-administered encounter medications on file as of 12/14/2017.  Activities of Daily Living In your present state of health, do you have any difficulty performing the following activities: 12/14/2017  Hearing? N  Vision? Y  Difficulty concentrating or making decisions? N  Walking or climbing stairs? N  Dressing or bathing? N  Doing errands, shopping? N  Preparing Food and eating ? N  Using the Toilet? N  In the past six months, have you accidently leaked urine? N  Do you have problems with loss of bowel control? N  Managing your Medications? N  Managing your Finances? N  Housekeeping or managing your Housekeeping? N  Some recent data might be hidden    Patient Care Team: Gildardo Cranker, DO as PCP - General (Internal Medicine) Teena Irani, MD as Consulting Physician (Gastroenterology) Warden Fillers, MD as Consulting Physician (Ophthalmology) [name], MD as Consulting Physician (Ophthalmology) Mickel Fuchs, MD as Referring Physician (Psychiatry)    Assessment:   This is a routine wellness examination for Tanya Bell.  Exercise Activities and Dietary recommendations Current Exercise Habits: The patient does not participate in regular exercise at present  Goals    . Weight (lb) < 200 lb (90.7 kg)     Starting 10/29/16, I will attempt to maintain my current weight or decrease when possible.        Fall  Risk Fall Risk  12/14/2017 06/08/2017 10/29/2016 05/19/2016 11/12/2015  Falls in the past year? No No No No No   Is the patient's home free of loose throw rugs in walkways, pet beds, electrical cords, etc?   yes      Grab bars in the bathroom? yes      Handrails on the stairs?   yes      Adequate lighting?   yes  Timed Get Up and Go performed: 17 seconds, fall risk  Depression Screen PHQ 2/9 Scores 12/14/2017 10/29/2016 02/04/2015 09/25/2014  PHQ - 2 Score 1 0 0 0     Cognitive Function MMSE - Mini Mental State Exam 12/14/2017 10/29/2016 09/04/2014  Orientation to time 5 5 5   Orientation to Place 5 5 5   Registration 3 3 3   Attention/ Calculation 5 5 3   Recall 1 2 1   Language- name 2 objects 2 2 2   Language- repeat 1 1 1   Language- follow 3 step command 3 2 2   Language- read & follow direction 1 1 1   Write a sentence 1 1 1   Copy design 1 1 1   Total score 28 28 25         Immunization History  Administered Date(s) Administered  . Influenza Split 08/25/2010, 08/26/2011, 10/04/2012  . Influenza Whole 09/24/2009  . Influenza,inj,Quad PF,6+ Mos 09/11/2013, 09/04/2014, 11/12/2015, 10/04/2016, 09/06/2017  . Pneumococcal Conjugate-13 02/17/2012  . Pneumococcal Polysaccharide-23 05/19/2016  . Tdap 02/17/2012    Qualifies for Shingles Vaccine?yes, educated and will get later this year  Screening Tests Health Maintenance  Topic Date Due  . HEMOGLOBIN A1C  03/02/2018  . OPHTHALMOLOGY EXAM  06/10/2018  . FOOT EXAM  09/06/2018  . TETANUS/TDAP  02/16/2022  . COLONOSCOPY  12/21/2025  . INFLUENZA VACCINE  Completed  . DEXA SCAN  Completed  . PNA vac Low Risk Adult  Completed    Cancer Screenings: Lung: Low Dose CT Chest recommended if Age 94-80 years, 30 pack-year currently smoking OR have quit w/in 15years. Patient does not qualify. Breast:  Up to date on Mammogram? Yes   Up to date of Bone Density/Dexa? Yes Colorectal: up to date  Additional Screenings:  Hepatitis  B/HIV/Syphillis:Not indicated Hepatitis C Screening: Not indicated     Plan:    I have personally reviewed and addressed the Medicare Annual Wellness questionnaire and have noted the following in the patient's chart:  A. Medical and social history B. Use of alcohol, tobacco or illicit drugs  C. Current medications and supplements D. Functional ability and status E.  Nutritional status F.  Physical activity G. Advance directives H. List of other physicians I.  Hospitalizations, surgeries, and ER visits in previous 12 months J.  [place] to include hearing, vision, cognitive, depression L. Referrals and appointments - none  In addition, I have reviewed and discussed with patient certain preventive protocols, quality metrics, and best practice recommendations. A written personalized care plan for preventive services as well as general preventive health recommendations were provided to patient.  See attached scanned questionnaire for additional information.   Signed,   [name], RN Nurse Health Advisor   Quick Notes   Health Maintenance: Due for Shingrix, pt is going to wait until after her retina shots are done.    Abnormal Screen: MMSE 28/30, passed clock drawing     Patient Concerns: Needs help finding transportation to [facility] for retina shots- C3 referral put in     Nurse Concerns: none

## 2017-12-14 NOTE — Patient Instructions (Signed)
[name] , Thank you for taking time to come for your Medicare Wellness Visit. I appreciate your ongoing commitment to your health goals. Please review the following plan we discussed and let me know if I can assist you in the future.   Screening recommendations/referrals: Colonoscopy up to date. Mammogram up to date.  Bone Density up to date Recommended yearly ophthalmology/optometry visit for glaucoma screening and checkup Recommended yearly dental visit for hygiene and checkup  Vaccinations: Influenza vaccine up to date. Due 2019 fall season Pneumococcal vaccine up to date Tdap vaccine up to date. Due 02/16/2022 Shingles vaccine due    Advanced directives: Please bring [place] a copy of your living will and health care power of attorney  Conditions/risks identified: none  Next appointment: [name], RN 12/20/2018 @ 8:30am   Preventive Care 65 Years and Older, Female Preventive care refers to lifestyle choices and visits with your health care provider that can promote health and wellness. What does preventive care include?  A yearly physical exam. This is also called an annual well check.  Dental exams once or twice a year.  Routine eye exams. Ask your health care provider how often you should have your eyes checked.  Personal lifestyle choices, including:  Daily care of your teeth and gums.  Regular physical activity.  Eating a healthy diet.  Avoiding tobacco and drug use.  Limiting alcohol use.  Practicing safe sex.  Taking low-dose aspirin every day.  Taking vitamin and mineral supplements as recommended by your health care provider. What happens during an annual well check? The services and screenings done by your health care provider during your annual well check will depend on your age, overall health, lifestyle risk factors, and family history of disease. Counseling  Your health care provider may ask you questions about your:  Alcohol use.  Tobacco  use.  Drug use.  Emotional well-being.  Home and relationship well-being.  Sexual activity.  Eating habits.  History of falls.  Memory and ability to understand (cognition).  Work and work Statistician.  Reproductive health. Screening  You may have the following tests or measurements:  Height, weight, and BMI.  Blood pressure.  Lipid and cholesterol levels. These may be checked every 5 years, or more frequently if you are over 74 years old.  Skin check.  Lung cancer screening. You may have this screening every year starting at age 16 if you have a 30-pack-year history of smoking and currently smoke or have quit within the past 15 years.  Fecal occult blood test (FOBT) of the stool. You may have this test every year starting at age 30.  Flexible sigmoidoscopy or colonoscopy. You may have a sigmoidoscopy every 5 years or a colonoscopy every 10 years starting at age 16.  Hepatitis C blood test.  Hepatitis B blood test.  Sexually transmitted disease (STD) testing.  Diabetes screening. This is done by checking your blood sugar (glucose) after you have not eaten for a while (fasting). You may have this done every 1-3 years.  Bone density scan. This is done to screen for osteoporosis. You may have this done starting at age 110.  Mammogram. This may be done every 1-2 years. Talk to your health care provider about how often you should have regular mammograms. Talk with your health care provider about your test results, treatment options, and if necessary, the need for more tests. Vaccines  Your health care provider may recommend certain vaccines, such as:  Influenza vaccine. This  is recommended every year.  Tetanus, diphtheria, and acellular pertussis (Tdap, Td) vaccine. You may need a Td booster every 10 years.  Zoster vaccine. You may need this after age 48.  Pneumococcal 13-valent conjugate (PCV13) vaccine. One dose is recommended after age 103.  Pneumococcal  polysaccharide (PPSV23) vaccine. One dose is recommended after age 48. Talk to your health care provider about which screenings and vaccines you need and how often you need them. This information is not intended to replace advice given to you by your health care provider. Make sure you discuss any questions you have with your health care provider. Document Released: 12/26/2015 Document Revised: 08/18/2016 Document Reviewed: 09/30/2015 Elsevier Interactive Patient Education  2017 [employer] Prevention in the Home Falls can cause injuries. They can happen to people of all ages. There are many things you can do to make your home safe and to help prevent falls. What can I do on the outside of my home?  Regularly fix the edges of walkways and driveways and fix any cracks.  Remove anything that might make you trip as you walk through a door, such as a raised step or threshold.  Trim any bushes or trees on the path to your home.  Use bright outdoor lighting.  Clear any walking paths of anything that might make someone trip, such as rocks or tools.  Regularly check to see if handrails are loose or broken. Make sure that both sides of any steps have handrails.  Any raised decks and porches should have guardrails on the edges.  Have any leaves, snow, or ice cleared regularly.  Use sand or salt on walking paths during winter.  Clean up any spills in your garage right away. This includes oil or grease spills. What can I do in the bathroom?  Use night lights.  Install grab bars by the toilet and in the tub and shower. Do not use towel bars as grab bars.  Use non-skid mats or decals in the tub or shower.  If you need to sit down in the shower, use a plastic, non-slip stool.  Keep the floor dry. Clean up any water that spills on the floor as soon as it happens.  Remove soap buildup in the tub or shower regularly.  Attach bath mats securely with double-sided non-slip rug  tape.  Do not have throw rugs and other things on the floor that can make you trip. What can I do in the bedroom?  Use night lights.  Make sure that you have a light by your bed that is easy to reach.  Do not use any sheets or blankets that are too big for your bed. They should not hang down onto the floor.  Have a firm chair that has side arms. You can use this for support while you get dressed.  Do not have throw rugs and other things on the floor that can make you trip. What can I do in the kitchen?  Clean up any spills right away.  Avoid walking on wet floors.  Keep items that you use a lot in easy-to-reach places.  If you need to reach something above you, use a strong step stool that has a grab bar.  Keep electrical cords out of the way.  Do not use floor polish or wax that makes floors slippery. If you must use wax, use non-skid floor wax.  Do not have throw rugs and other things on the floor that can make you  trip. What can I do with my stairs?  Do not leave any items on the stairs.  Make sure that there are handrails on both sides of the stairs and use them. Fix handrails that are broken or loose. Make sure that handrails are as long as the stairways.  Check any carpeting to make sure that it is firmly attached to the stairs. Fix any carpet that is loose or worn.  Avoid having throw rugs at the top or bottom of the stairs. If you do have throw rugs, attach them to the floor with carpet tape.  Make sure that you have a light switch at the top of the stairs and the bottom of the stairs. If you do not have them, ask someone to add them for you. What else can I do to help prevent falls?  Wear shoes that:  Do not have high heels.  Have rubber bottoms.  Are comfortable and fit you well.  Are closed at the toe. Do not wear sandals.  If you use a stepladder:  Make sure that it is fully opened. Do not climb a closed stepladder.  Make sure that both sides of the  stepladder are locked into place.  Ask someone to hold it for you, if possible.  Clearly mark and make sure that you can see:  Any grab bars or handrails.  First and last steps.  Where the edge of each step is.  Use tools that help you move around (mobility aids) if they are needed. These include:  Canes.  Walkers.  Scooters.  Crutches.  Turn on the lights when you go into a dark area. Replace any light bulbs as soon as they burn out.  Set up your furniture so you have a clear path. Avoid moving your furniture around.  If any of your floors are uneven, fix them.  If there are any pets around you, be aware of where they are.  Review your medicines with your doctor. Some medicines can make you feel dizzy. This can increase your chance of falling. Ask your doctor what other things that you can do to help prevent falls. This information is not intended to replace advice given to you by your health care provider. Make sure you discuss any questions you have with your health care provider. Document Released: 09/25/2009 Document Revised: 05/06/2016 Document Reviewed: 01/03/2015 Elsevier Interactive Patient Education  2017 [employer].

## 2017-12-15 DIAGNOSIS — H35351 Cystoid macular degeneration, right eye: Secondary | ICD-10-CM | POA: Diagnosis not present

## 2017-12-15 LAB — BASIC METABOLIC PANEL WITH GFR
BUN: 18 mg/dL (ref 7–25)
CHLORIDE: 105 mmol/L (ref 98–110)
CO2: 29 mmol/L (ref 20–32)
CREATININE: 0.79 mg/dL (ref 0.60–0.93)
Calcium: 9.3 mg/dL (ref 8.6–10.4)
GFR, Est African American: 85 mL/min/{1.73_m2} (ref >=60)
GFR, Est Non African American: 73 mL/min/{1.73_m2} (ref >=60)
GLUCOSE: 130 mg/dL — AB (ref 65–99)
Potassium: 4.1 mmol/L (ref 3.5–5.3)
Sodium: 140 mmol/L (ref 135–146)

## 2017-12-15 LAB — HEMOGLOBIN A1C
Hgb A1c MFr Bld: 6.1 % of total Hgb — ABNORMAL HIGH (ref <5.7)
Mean Plasma Glucose: 128 (calc)
eAG (mmol/L): 7.1 (calc)

## 2017-12-15 LAB — LIPID PANEL
CHOL/HDL RATIO: 3.4 (calc) (ref <5.0)
Cholesterol: 178 mg/dL (ref <200)
HDL: 53 mg/dL (ref >50)
LDL Cholesterol (Calc): 111 mg/dL (calc) — ABNORMAL HIGH
NON-HDL CHOLESTEROL (CALC): 125 mg/dL (ref <130)
TRIGLYCERIDES: 56 mg/dL (ref <150)

## 2017-12-15 LAB — ALT: ALT: 10 U/L (ref 6–29)

## 2017-12-16 ENCOUNTER — Encounter: Payer: Self-pay | Admitting: Internal Medicine

## 2017-12-16 ENCOUNTER — Ambulatory Visit (INDEPENDENT_AMBULATORY_CARE_PROVIDER_SITE_OTHER): Payer: Medicare Other | Admitting: Internal Medicine

## 2017-12-16 VITALS — BP 118/68 | HR 50 | Temp 98.0°F | Ht 61.0 in | Wt 156.0 lb

## 2017-12-16 DIAGNOSIS — E785 Hyperlipidemia, unspecified: Secondary | ICD-10-CM | POA: Diagnosis not present

## 2017-12-16 DIAGNOSIS — E119 Type 2 diabetes mellitus without complications: Secondary | ICD-10-CM | POA: Diagnosis not present

## 2017-12-16 DIAGNOSIS — H3530 Unspecified macular degeneration: Secondary | ICD-10-CM | POA: Diagnosis not present

## 2017-12-16 DIAGNOSIS — I10 Essential (primary) hypertension: Secondary | ICD-10-CM | POA: Diagnosis not present

## 2017-12-16 NOTE — Patient Instructions (Signed)
Continue current medications as ordered  Follow up with specialists as scheduled  Follow up in 3 mos for DM, HTN, hyperlipidemia. Fasting labs prior to appt

## 2017-12-16 NOTE — Progress Notes (Signed)
Patient ID: Tanya Bell, female   DOB: 03-Jan-1941, 77 y.o.   MRN: [id number]   Location:  [facility] OFFICE  Provider: DR Arletha Grippe  Code Status: FULL CODE Goals of Care:  Advanced Directives 12/14/2017  Does Patient Have a Medical Advance Directive? No  Does patient want to make changes to medical advance directive? -  Would patient like information on creating a medical advance directive? Yes (MAU/Ambulatory/Procedural Areas - Information given)     Chief Complaint  Patient presents with  . Medical Management of Chronic Issues    3 month follow-up and discuss labs (copy printed)   . Medication Refill    No refills needed   . Medication Management    Discuss recalls Losartan and Amlodipine   . Immunizations    Will hold off on shinbgles vaccine until eye issues resolved     HPI: Patient is a 77 y.o. female seen today for medical management of chronic diseases.  She saw Dr Zadie Rhine Sept 2018 and has trouble with vision on right. She is followed by retinal specialist at [facility] for macula edema and degeneration. She uses eye gtts  DM - no low BS reactions on metformin. A1c 6.1%. BS stable at home. She has ariva meter. Urine microalbumin/Cr ratio 22. LDL 111  HTN - stable on losartan HCT, amlodipine, coreg. She also takes ASA daily  Hyperlipidemia - stable. no myalgias. Takes crestor 1/2 tab on MWF due to previous rxn of myalgias and daily zetia. LDL 11 (prev 101)   Seasonal allergy/asthma - stable with no exacerbations. Last PFTs several years ago in [place]. Stable on nasonex. She uses spiriva, symbicort, proventil hfa  Vaginal dryness - improved on premarin cream  GERD - controlled on zantac. Takes align.   She is UTD on most vaccinations. She still has not obtained zostavax   Past Medical History:  Diagnosis Date  . Acute upper respiratory infections of unspecified site   . Acute upper respiratory infections of unspecified site   . Acute upper respiratory infections of  unspecified site   . Allergic rhinitis due to pollen   . Candidiasis of mouth   . Disorder of bone and cartilage, unspecified   . Diverticulitis of colon (without mention of hemorrhage)(562.11)   . Essential hypertension, benign   . Essential hypertension, benign   . Extrinsic asthma, unspecified   . Herpes simplex with other ophthalmic complications   . Other abnormal glucose   . Other and unspecified hyperlipidemia   . Other and unspecified hyperlipidemia   . Other and unspecified hyperlipidemia   . Other cataract   . Other cataract   . Other dystrophy of vulva   . Other malaise and fatigue   . Rash and other nonspecific skin eruption   . Reflux esophagitis   . Thrombocytopenia, unspecified ([place])   . Type II or unspecified type diabetes mellitus without mention of complication, not stated as uncontrolled   . Type II or unspecified type diabetes mellitus without mention of complication, not stated as uncontrolled   . Unspecified disorder of skin and subcutaneous tissue   . Unspecified essential hypertension   . Unspecified essential hypertension     Past Surgical History:  Procedure Laterality Date  . ABDOMINAL HYSTERECTOMY    . CESAREAN SECTION     [phone]  . CYST EXCISION  1974   Pilonidal Cyst excision  . EYE SURGERY Right 04/14/2017   Dr. Zadie Rhine  . KNEE ARTHROSCOPY Right  for meniscal tear  . RETINAL TEAR REPAIR CRYOTHERAPY  10/21/2017   Getting injections in right eye     reports that she quit smoking about 27 years ago. Her smoking use included cigarettes. she has never used smokeless tobacco. She reports that she does not drink alcohol or use drugs. Social History   Socioeconomic History  . Marital status: Married    Spouse name: Not on file  . Number of children: Not on file  . Years of education: Not on file  . Highest education level: Not on file  Social Needs  . Financial resource strain: Not hard at all  . Food insecurity - worry: Never  true  . Food insecurity - inability: Never true  . Transportation needs - medical: Yes  . Transportation needs - non-medical: Yes  Occupational History  . Not on file  Tobacco Use  . Smoking status: Former Smoker    Types: Cigarettes    Last attempt to quit: 12/13/1990    Years since quitting: 27.0  . Smokeless tobacco: Never Used  Substance and Sexual Activity  . Alcohol use: No    Alcohol/week: 0.0 oz  . Drug use: No  . Sexual activity: Yes    Partners: Male    Comment: None   Other Topics Concern  . Not on file  Social History Narrative  . Not on file    Family History  Problem Relation Age of Onset  . Cancer Mother        brain  . Stroke Father     Allergies  Allergen Reactions  . Latex Other (See Comments)    GLOVES  . Sulfa Antibiotics     Outpatient Encounter Medications as of 12/16/2017  Medication Sig  . acetaminophen (TYLENOL) 325 MG tablet Take 325 mg by mouth daily as needed. For pain  . albuterol (VENTOLIN HFA) 108 (90 Base) MCG/ACT inhaler Inhale 2 puffs into the lungs every 6 (six) hours as needed for wheezing or shortness of breath.  . AMBULATORY NON FORMULARY MEDICATION Compression stockings: to both legs when out of bed, please remove before going to bed at night 1 pair  . amLODipine (NORVASC) 5 MG tablet Take 1 tablet (5 mg total) by mouth daily.  Marland Kitchen aspirin 81 MG tablet Take 81 mg by mouth daily.  . bifidobacterium infantis (ALIGN) capsule Take one tablet once a day  . brimonidine-timolol (COMBIGAN) 0.2-0.5 % ophthalmic solution Place 1 drop into the right eye 2 (two) times daily.  . budesonide-formoterol (SYMBICORT) 160-4.5 MCG/ACT inhaler Use 2 inhalations twice daily  . calcium-vitamin D (OSCAL WITH D) 500-200 MG-UNIT per tablet Take 1 tablet by mouth 2 (two) times daily.  . carvedilol (COREG) 25 MG tablet TAKE 1 TABLET TWICE A DAY FOR BLOOD PRESSURE  . conjugated estrogens (PREMARIN) vaginal cream Place vaginally daily.  Marland Kitchen ezetimibe (ZETIA) 10  MG tablet Take 1 tablet (10 mg total) by mouth daily.  . fexofenadine (ALLEGRA) 180 MG tablet Take 180 mg by mouth daily.  . hydrocortisone (ANUSOL-HC) 2.5 % rectal cream Place 1 application rectally 2 (two) times daily as needed for hemorrhoids or itching.  Marland Kitchen KETOROLAC TROMETHAMINE OP Apply 1 drop to eye 4 (four) times daily. Right eye  . losartan (COZAAR) 100 MG tablet Take 1 tablet (100 mg total) by mouth daily.  . metFORMIN (GLUCOPHAGE) 500 MG tablet Take 1 tablet (500 mg total) by mouth daily with breakfast.  . mometasone (NASONEX) 50 MCG/ACT nasal spray One spray in each  nostril twice daily as needed for allergies.  . Multiple Vitamin (MULTIVITAMIN) tablet Take 1 tablet by mouth daily.  . ranitidine (ZANTAC) 150 MG tablet Take 150 mg by mouth. Take one tablet by mouth daily in the morning.  . rosuvastatin (CRESTOR) 20 MG tablet Take one tablet three times a week, Monday, Wednesday and Friday  . tiotropium (SPIRIVA) 18 MCG inhalation capsule Place 1 capsule (18 mcg total) into inhaler and inhale daily.  . vitamin C (ASCORBIC ACID) 500 MG tablet Take 500 mg by mouth daily.  . [DISCONTINUED] losartan (COZAAR) 100 MG tablet Take 1 tablet (100 mg total) by mouth daily.   No facility-administered encounter medications on file as of 12/16/2017.     Review of Systems:  Review of Systems  Constitutional: Positive for fatigue.  Eyes: Positive for visual disturbance.  All other systems reviewed and are negative.   Health Maintenance  Topic Date Due  . OPHTHALMOLOGY EXAM  06/10/2018  . HEMOGLOBIN A1C  06/13/2018  . FOOT EXAM  09/06/2018  . TETANUS/TDAP  02/16/2022  . COLONOSCOPY  12/21/2025  . INFLUENZA VACCINE  Completed  . DEXA SCAN  Completed  . PNA vac Low Risk Adult  Completed    Physical Exam: Vitals:   12/16/17 1045  BP: 118/68  Pulse: (!) 50  Temp: 98 F (36.7 C)  TempSrc: Oral  SpO2: 95%  Weight: 156 lb (70.8 kg)  Height: 5\' 1"  (1.549 m)   Body mass index is 29.48  kg/m. Physical Exam  Constitutional: She is oriented to person, place, and time. She appears well-developed and well-nourished.  HENT:  Mouth/Throat: Oropharynx is clear and moist. No oropharyngeal exudate.  MMM; no oral thrush  Eyes: Pupils are equal, round, and reactive to light. No scleral icterus.  Neck: Neck supple. Carotid bruit is not present. No tracheal deviation present. No thyromegaly present.  Cardiovascular: Normal rate, regular rhythm and intact distal pulses. Exam reveals no gallop and no friction rub.  Murmur (1/6 SEM) heard. No LE edema b/l. no calf TTP.   Pulmonary/Chest: Effort normal and breath sounds normal. No stridor. No respiratory distress. She has no wheezes. She has no rales.  Abdominal: Soft. Normal appearance and bowel sounds are normal. She exhibits no distension and no mass. There is no hepatomegaly. There is no tenderness. There is no rigidity, no rebound and no guarding. No hernia.  Musculoskeletal: She exhibits edema.  Lymphadenopathy:    She has no cervical adenopathy.  Neurological: She is alert and oriented to person, place, and time. She has normal reflexes.  Skin: Skin is warm and dry. No rash noted.  Psychiatric: She has a normal mood and affect. Her behavior is normal. Judgment and thought content normal.    Labs reviewed: Basic Metabolic Panel: Recent Labs    06/06/17 1056 09/02/17 0850 12/14/17 0839  NA 141 139 140  K 4.6 3.9 4.1  CL 105 104 105  CO2 27 27 29   GLUCOSE 113* 132* 130*  BUN 12 12 18   CREATININE 0.65 0.73 0.79  CALCIUM 9.5 9.2 9.3  TSH 1.69  --   --    Liver Function Tests: Recent Labs    06/06/17 1056 09/02/17 0850 12/14/17 0839  AST 13  --   --   ALT 13 12 10   ALKPHOS 61  --   --   BILITOT 0.5  --   --   PROT 7.2  --   --   ALBUMIN 3.9  --   --  No results for input(s): LIPASE, AMYLASE in the last 8760 hours. No results for input(s): AMMONIA in the last 8760 hours. CBC: Recent Labs    06/06/17 1056    WBC 5.1  NEUTROABS 1,632  HGB 13.6  HCT 41.5  MCV 78.3*  PLT 190   Lipid Panel: Recent Labs    06/06/17 1056 09/02/17 0850 12/14/17 0839  CHOL 140 178 178  HDL 59 62 53  LDLCALC 71  --   --   TRIG 52 67 56  CHOLHDL 2.4 2.9 3.4   Lab Results  Component Value Date   HGBA1C 6.1 (H) 12/14/2017    Procedures since last visit: No results found.  Assessment/Plan   ICD-10-CM   1. Controlled type 2 diabetes mellitus without complication, without long-term current use of insulin (HCC) E11.9   2. Essential hypertension I10   3. Hyperlipidemia LDL goal <100 E78.5   4. Macular degeneration of right eye, unspecified type H35.30    Continue current medications as ordered  Follow up with specialists as scheduled  She prefers to hold off on shingrix at this time due to eye issues  Follow up in 3 mos for DM, HTN, hyperlipidemia. Fasting labs prior to appt (cmp, lipid, a1c)   Selassie Spatafore S. Perlie Gold  [facility] [address] [place] , [place] [address] [phone] Cell (Monday-Friday 8 AM - 5 PM) [phone] After 5 PM and follow prompts

## 2017-12-22 DIAGNOSIS — H3521 Other non-diabetic proliferative retinopathy, right eye: Secondary | ICD-10-CM | POA: Diagnosis not present

## 2017-12-22 DIAGNOSIS — H43822 Vitreomacular adhesion, left eye: Secondary | ICD-10-CM | POA: Diagnosis not present

## 2017-12-22 DIAGNOSIS — Z961 Presence of intraocular lens: Secondary | ICD-10-CM | POA: Diagnosis not present

## 2017-12-22 DIAGNOSIS — Z7984 Long term (current) use of oral hypoglycemic drugs: Secondary | ICD-10-CM | POA: Diagnosis not present

## 2017-12-22 DIAGNOSIS — Z882 Allergy status to sulfonamides status: Secondary | ICD-10-CM | POA: Diagnosis not present

## 2017-12-22 DIAGNOSIS — Z9889 Other specified postprocedural states: Secondary | ICD-10-CM | POA: Diagnosis not present

## 2017-12-22 DIAGNOSIS — Z8669 Personal history of other diseases of the nervous system and sense organs: Secondary | ICD-10-CM | POA: Diagnosis not present

## 2017-12-22 DIAGNOSIS — H35351 Cystoid macular degeneration, right eye: Secondary | ICD-10-CM | POA: Diagnosis not present

## 2017-12-22 DIAGNOSIS — E119 Type 2 diabetes mellitus without complications: Secondary | ICD-10-CM | POA: Diagnosis not present

## 2017-12-22 DIAGNOSIS — H35372 Puckering of macula, left eye: Secondary | ICD-10-CM | POA: Diagnosis not present

## 2017-12-29 DIAGNOSIS — Z8669 Personal history of other diseases of the nervous system and sense organs: Secondary | ICD-10-CM | POA: Diagnosis not present

## 2017-12-29 DIAGNOSIS — H43822 Vitreomacular adhesion, left eye: Secondary | ICD-10-CM | POA: Diagnosis not present

## 2017-12-29 DIAGNOSIS — Z961 Presence of intraocular lens: Secondary | ICD-10-CM | POA: Diagnosis not present

## 2017-12-29 DIAGNOSIS — H35372 Puckering of macula, left eye: Secondary | ICD-10-CM | POA: Diagnosis not present

## 2017-12-29 DIAGNOSIS — H35351 Cystoid macular degeneration, right eye: Secondary | ICD-10-CM | POA: Diagnosis not present

## 2017-12-29 DIAGNOSIS — E119 Type 2 diabetes mellitus without complications: Secondary | ICD-10-CM | POA: Diagnosis not present

## 2017-12-29 DIAGNOSIS — H3521 Other non-diabetic proliferative retinopathy, right eye: Secondary | ICD-10-CM | POA: Diagnosis not present

## 2018-01-05 DIAGNOSIS — Z961 Presence of intraocular lens: Secondary | ICD-10-CM | POA: Diagnosis not present

## 2018-01-05 DIAGNOSIS — H43822 Vitreomacular adhesion, left eye: Secondary | ICD-10-CM | POA: Diagnosis not present

## 2018-01-05 DIAGNOSIS — Z8669 Personal history of other diseases of the nervous system and sense organs: Secondary | ICD-10-CM | POA: Diagnosis not present

## 2018-01-05 DIAGNOSIS — H35372 Puckering of macula, left eye: Secondary | ICD-10-CM | POA: Diagnosis not present

## 2018-01-05 DIAGNOSIS — E119 Type 2 diabetes mellitus without complications: Secondary | ICD-10-CM | POA: Diagnosis not present

## 2018-01-05 DIAGNOSIS — H35351 Cystoid macular degeneration, right eye: Secondary | ICD-10-CM | POA: Diagnosis not present

## 2018-01-05 DIAGNOSIS — H3521 Other non-diabetic proliferative retinopathy, right eye: Secondary | ICD-10-CM | POA: Diagnosis not present

## 2018-01-05 DIAGNOSIS — Z9842 Cataract extraction status, left eye: Secondary | ICD-10-CM | POA: Diagnosis not present

## 2018-01-05 DIAGNOSIS — Z9841 Cataract extraction status, right eye: Secondary | ICD-10-CM | POA: Diagnosis not present

## 2018-01-12 DIAGNOSIS — H43822 Vitreomacular adhesion, left eye: Secondary | ICD-10-CM | POA: Diagnosis not present

## 2018-01-12 DIAGNOSIS — Z961 Presence of intraocular lens: Secondary | ICD-10-CM | POA: Diagnosis not present

## 2018-01-12 DIAGNOSIS — H35372 Puckering of macula, left eye: Secondary | ICD-10-CM | POA: Diagnosis not present

## 2018-01-12 DIAGNOSIS — Z9842 Cataract extraction status, left eye: Secondary | ICD-10-CM | POA: Diagnosis not present

## 2018-01-12 DIAGNOSIS — E119 Type 2 diabetes mellitus without complications: Secondary | ICD-10-CM | POA: Diagnosis not present

## 2018-01-12 DIAGNOSIS — H3521 Other non-diabetic proliferative retinopathy, right eye: Secondary | ICD-10-CM | POA: Diagnosis not present

## 2018-01-12 DIAGNOSIS — Z9841 Cataract extraction status, right eye: Secondary | ICD-10-CM | POA: Diagnosis not present

## 2018-01-12 DIAGNOSIS — Z7984 Long term (current) use of oral hypoglycemic drugs: Secondary | ICD-10-CM | POA: Diagnosis not present

## 2018-01-12 DIAGNOSIS — H35351 Cystoid macular degeneration, right eye: Secondary | ICD-10-CM | POA: Diagnosis not present

## 2018-01-12 DIAGNOSIS — Z8669 Personal history of other diseases of the nervous system and sense organs: Secondary | ICD-10-CM | POA: Diagnosis not present

## 2018-01-17 ENCOUNTER — Other Ambulatory Visit: Payer: Self-pay | Admitting: *Deleted

## 2018-01-17 MED ORDER — EZETIMIBE 10 MG PO TABS: 10.0000 mg | ORAL_TABLET | Freq: Every day | ORAL | 0 refills | Status: DC

## 2018-01-17 MED ORDER — EZETIMIBE 10 MG PO TABS: 10.0000 mg | ORAL_TABLET | Freq: Every day | ORAL | 3 refills | Status: DC

## 2018-01-17 NOTE — Telephone Encounter (Signed)
Patient called and requested a local Rx to be faxed and a Mail Order Rx to [facility]. Faxed.

## 2018-01-26 DIAGNOSIS — H35351 Cystoid macular degeneration, right eye: Secondary | ICD-10-CM | POA: Diagnosis not present

## 2018-01-26 DIAGNOSIS — H3521 Other non-diabetic proliferative retinopathy, right eye: Secondary | ICD-10-CM | POA: Diagnosis not present

## 2018-01-26 DIAGNOSIS — H43822 Vitreomacular adhesion, left eye: Secondary | ICD-10-CM | POA: Diagnosis not present

## 2018-01-26 DIAGNOSIS — Z9841 Cataract extraction status, right eye: Secondary | ICD-10-CM | POA: Diagnosis not present

## 2018-01-26 DIAGNOSIS — Z961 Presence of intraocular lens: Secondary | ICD-10-CM | POA: Diagnosis not present

## 2018-01-26 DIAGNOSIS — E119 Type 2 diabetes mellitus without complications: Secondary | ICD-10-CM | POA: Diagnosis not present

## 2018-01-26 DIAGNOSIS — Z9842 Cataract extraction status, left eye: Secondary | ICD-10-CM | POA: Diagnosis not present

## 2018-01-26 DIAGNOSIS — Z9889 Other specified postprocedural states: Secondary | ICD-10-CM | POA: Diagnosis not present

## 2018-01-26 DIAGNOSIS — Z882 Allergy status to sulfonamides status: Secondary | ICD-10-CM | POA: Diagnosis not present

## 2018-01-26 DIAGNOSIS — H35372 Puckering of macula, left eye: Secondary | ICD-10-CM | POA: Diagnosis not present

## 2018-02-09 DIAGNOSIS — H35372 Puckering of macula, left eye: Secondary | ICD-10-CM | POA: Diagnosis not present

## 2018-02-09 DIAGNOSIS — Z961 Presence of intraocular lens: Secondary | ICD-10-CM | POA: Diagnosis not present

## 2018-02-09 DIAGNOSIS — Z9889 Other specified postprocedural states: Secondary | ICD-10-CM | POA: Diagnosis not present

## 2018-02-09 DIAGNOSIS — H43822 Vitreomacular adhesion, left eye: Secondary | ICD-10-CM | POA: Diagnosis not present

## 2018-02-09 DIAGNOSIS — H3521 Other non-diabetic proliferative retinopathy, right eye: Secondary | ICD-10-CM | POA: Diagnosis not present

## 2018-02-09 DIAGNOSIS — H35351 Cystoid macular degeneration, right eye: Secondary | ICD-10-CM | POA: Diagnosis not present

## 2018-02-09 DIAGNOSIS — Z8669 Personal history of other diseases of the nervous system and sense organs: Secondary | ICD-10-CM | POA: Diagnosis not present

## 2018-02-09 DIAGNOSIS — D573 Sickle-cell trait: Secondary | ICD-10-CM | POA: Diagnosis not present

## 2018-02-13 ENCOUNTER — Other Ambulatory Visit: Payer: Self-pay | Admitting: Internal Medicine

## 2018-02-23 DIAGNOSIS — Z9842 Cataract extraction status, left eye: Secondary | ICD-10-CM | POA: Diagnosis not present

## 2018-02-23 DIAGNOSIS — Z8669 Personal history of other diseases of the nervous system and sense organs: Secondary | ICD-10-CM | POA: Diagnosis not present

## 2018-02-23 DIAGNOSIS — D573 Sickle-cell trait: Secondary | ICD-10-CM | POA: Diagnosis not present

## 2018-02-23 DIAGNOSIS — Z9841 Cataract extraction status, right eye: Secondary | ICD-10-CM | POA: Diagnosis not present

## 2018-02-23 DIAGNOSIS — H35351 Cystoid macular degeneration, right eye: Secondary | ICD-10-CM | POA: Diagnosis not present

## 2018-02-23 DIAGNOSIS — Z961 Presence of intraocular lens: Secondary | ICD-10-CM | POA: Diagnosis not present

## 2018-02-23 DIAGNOSIS — H35371 Puckering of macula, right eye: Secondary | ICD-10-CM | POA: Diagnosis not present

## 2018-02-23 DIAGNOSIS — H43822 Vitreomacular adhesion, left eye: Secondary | ICD-10-CM | POA: Diagnosis not present

## 2018-02-23 DIAGNOSIS — H35373 Puckering of macula, bilateral: Secondary | ICD-10-CM | POA: Diagnosis not present

## 2018-03-09 DIAGNOSIS — Z01818 Encounter for other preprocedural examination: Secondary | ICD-10-CM | POA: Diagnosis not present

## 2018-03-09 DIAGNOSIS — Z9842 Cataract extraction status, left eye: Secondary | ICD-10-CM | POA: Diagnosis not present

## 2018-03-09 DIAGNOSIS — H43822 Vitreomacular adhesion, left eye: Secondary | ICD-10-CM | POA: Diagnosis not present

## 2018-03-09 DIAGNOSIS — Z8669 Personal history of other diseases of the nervous system and sense organs: Secondary | ICD-10-CM | POA: Diagnosis not present

## 2018-03-09 DIAGNOSIS — D573 Sickle-cell trait: Secondary | ICD-10-CM | POA: Diagnosis not present

## 2018-03-09 DIAGNOSIS — H35351 Cystoid macular degeneration, right eye: Secondary | ICD-10-CM | POA: Diagnosis not present

## 2018-03-09 DIAGNOSIS — Z961 Presence of intraocular lens: Secondary | ICD-10-CM | POA: Diagnosis not present

## 2018-03-09 DIAGNOSIS — Z9889 Other specified postprocedural states: Secondary | ICD-10-CM | POA: Diagnosis not present

## 2018-03-09 DIAGNOSIS — H3521 Other non-diabetic proliferative retinopathy, right eye: Secondary | ICD-10-CM | POA: Diagnosis not present

## 2018-03-09 DIAGNOSIS — H35373 Puckering of macula, bilateral: Secondary | ICD-10-CM | POA: Diagnosis not present

## 2018-03-09 DIAGNOSIS — Z9841 Cataract extraction status, right eye: Secondary | ICD-10-CM | POA: Diagnosis not present

## 2018-04-06 DIAGNOSIS — H43822 Vitreomacular adhesion, left eye: Secondary | ICD-10-CM | POA: Diagnosis not present

## 2018-04-06 DIAGNOSIS — D573 Sickle-cell trait: Secondary | ICD-10-CM | POA: Diagnosis not present

## 2018-04-06 DIAGNOSIS — E119 Type 2 diabetes mellitus without complications: Secondary | ICD-10-CM | POA: Diagnosis not present

## 2018-04-06 DIAGNOSIS — H35373 Puckering of macula, bilateral: Secondary | ICD-10-CM | POA: Diagnosis not present

## 2018-04-06 DIAGNOSIS — H3521 Other non-diabetic proliferative retinopathy, right eye: Secondary | ICD-10-CM | POA: Diagnosis not present

## 2018-04-06 DIAGNOSIS — Z8669 Personal history of other diseases of the nervous system and sense organs: Secondary | ICD-10-CM | POA: Diagnosis not present

## 2018-04-06 DIAGNOSIS — H35351 Cystoid macular degeneration, right eye: Secondary | ICD-10-CM | POA: Diagnosis not present

## 2018-04-10 ENCOUNTER — Other Ambulatory Visit: Payer: Self-pay | Admitting: *Deleted

## 2018-04-10 MED ORDER — ROSUVASTATIN CALCIUM 20 MG PO TABS: ORAL_TABLET | ORAL | 0 refills | Status: DC

## 2018-04-10 MED ORDER — MOMETASONE FUROATE 50 MCG/ACT NA SUSP: NASAL | 3 refills | Status: DC

## 2018-04-10 NOTE — Telephone Encounter (Signed)
Patient called and requested Rx for Nasal spray to be sent to mail order pharmacy and for Cholesterol #10 be sent to local pharmacy until she was able to receive her mail order.

## 2018-04-11 ENCOUNTER — Other Ambulatory Visit: Payer: Self-pay | Admitting: *Deleted

## 2018-04-11 MED ORDER — LOSARTAN POTASSIUM 100 MG PO TABS: 100.0000 mg | ORAL_TABLET | Freq: Every day | ORAL | 0 refills | Status: DC

## 2018-04-11 NOTE — Telephone Encounter (Signed)
Patient stated that she needs some to get by until she receives her mail order.

## 2018-04-19 ENCOUNTER — Other Ambulatory Visit: Payer: Medicare Other

## 2018-04-19 DIAGNOSIS — E785 Hyperlipidemia, unspecified: Secondary | ICD-10-CM

## 2018-04-19 DIAGNOSIS — E119 Type 2 diabetes mellitus without complications: Secondary | ICD-10-CM

## 2018-04-20 LAB — LIPID PANEL
CHOL/HDL RATIO: 3.7 (calc) (ref <5.0)
CHOLESTEROL: 206 mg/dL — AB (ref <200)
HDL: 55 mg/dL (ref >50)
LDL Cholesterol (Calc): 135 mg/dL (calc) — ABNORMAL HIGH
NON-HDL CHOLESTEROL (CALC): 151 mg/dL — AB (ref <130)
TRIGLYCERIDES: 65 mg/dL (ref <150)

## 2018-04-20 LAB — HEMOGLOBIN A1C
HEMOGLOBIN A1C: 6.2 %{Hb} — AB (ref <5.7)
Mean Plasma Glucose: 131 (calc)
eAG (mmol/L): 7.3 (calc)

## 2018-04-20 LAB — COMPLETE METABOLIC PANEL WITH GFR
AG Ratio: 1.2 (calc) (ref 1.0–2.5)
ALBUMIN MSPROF: 4.1 g/dL (ref 3.6–5.1)
ALKALINE PHOSPHATASE (APISO): 63 U/L (ref 33–130)
ALT: 10 U/L (ref 6–29)
AST: 11 U/L (ref 10–35)
BUN: 14 mg/dL (ref 7–25)
CO2: 29 mmol/L (ref 20–32)
CREATININE: 0.76 mg/dL (ref 0.60–0.93)
Calcium: 9.8 mg/dL (ref 8.6–10.4)
Chloride: 104 mmol/L (ref 98–110)
GFR, EST NON AFRICAN AMERICAN: 76 mL/min/{1.73_m2} (ref >=60)
GFR, Est African American: 88 mL/min/{1.73_m2} (ref >=60)
GLOBULIN: 3.4 g/dL (ref 1.9–3.7)
Glucose, Bld: 112 mg/dL — ABNORMAL HIGH (ref 65–99)
Potassium: 4.6 mmol/L (ref 3.5–5.3)
Sodium: 142 mmol/L (ref 135–146)
Total Bilirubin: 0.6 mg/dL (ref 0.2–1.2)
Total Protein: 7.5 g/dL (ref 6.1–8.1)

## 2018-04-21 ENCOUNTER — Encounter: Payer: Self-pay | Admitting: Internal Medicine

## 2018-04-21 ENCOUNTER — Ambulatory Visit (INDEPENDENT_AMBULATORY_CARE_PROVIDER_SITE_OTHER): Payer: Medicare Other | Admitting: Internal Medicine

## 2018-04-21 VITALS — BP 136/82 | HR 62 | Temp 97.9°F | Ht 61.0 in | Wt 157.0 lb

## 2018-04-21 DIAGNOSIS — R1013 Epigastric pain: Secondary | ICD-10-CM | POA: Diagnosis not present

## 2018-04-21 DIAGNOSIS — R351 Nocturia: Secondary | ICD-10-CM | POA: Diagnosis not present

## 2018-04-21 DIAGNOSIS — I10 Essential (primary) hypertension: Secondary | ICD-10-CM | POA: Diagnosis not present

## 2018-04-21 DIAGNOSIS — K5732 Diverticulitis of large intestine without perforation or abscess without bleeding: Secondary | ICD-10-CM

## 2018-04-21 DIAGNOSIS — E785 Hyperlipidemia, unspecified: Secondary | ICD-10-CM | POA: Diagnosis not present

## 2018-04-21 DIAGNOSIS — R350 Frequency of micturition: Secondary | ICD-10-CM

## 2018-04-21 DIAGNOSIS — E119 Type 2 diabetes mellitus without complications: Secondary | ICD-10-CM

## 2018-04-21 LAB — URINALYSIS, ROUTINE W REFLEX MICROSCOPIC
BILIRUBIN URINE: NEGATIVE
GLUCOSE, UA: NEGATIVE
Hgb urine dipstick: NEGATIVE
Ketones, ur: NEGATIVE
Leukocytes, UA: NEGATIVE
Nitrite: NEGATIVE
PH: 5.5 (ref 5.0–8.0)
Protein, ur: NEGATIVE
Specific Gravity, Urine: 1.022 (ref 1.001–1.03)

## 2018-04-21 MED ORDER — CIPROFLOXACIN HCL 500 MG PO TABS: 500.0000 mg | ORAL_TABLET | Freq: Two times a day (BID) | ORAL | 0 refills | Status: DC

## 2018-04-21 NOTE — Patient Instructions (Addendum)
START CIPRO 500MG  2 TIMES DAILY X 7 DAYS FOR DIVERTICULITIS  Take probiotic daily while on antibiotic  Will call with urine results  Continue other medications as ordered  Follow up in 3 mos for DM, hyperlipidemia, HTN, GERD. Fasting labs prior to appt

## 2018-04-21 NOTE — Progress Notes (Signed)
Patient ID: Tanya Bell, female   DOB: Jan 22, 1941, 77 y.o.   MRN: [id number]   Location:  [facility] OFFICE  Provider: DR Arletha Grippe  Goals of Care:  Advanced Directives 04/21/2018  Does Patient Have a Medical Advance Directive? No  Does patient want to make changes to medical advance directive? Yes (MAU/Ambulatory/Procedural Areas - Information given)  Would patient like information on creating a medical advance directive? -     Chief Complaint  Patient presents with  . Medical Management of Chronic Issues    3 month follow-up, discuss lab results (copy printed)  . Advance Care Planning    No ACP on file, paperwork given at previous visit     HPI: Patient is a 77 y.o. female seen today for medical management of chronic diseases.  She has abdominal cramping pain and gas. She has been eating strawberries and nuts. She has hx diverticulosis. She tried OTC zantac.   She also c/o nocturia (gets up every 66mn frequently but prior to that she went 4 times per night) x several days.  DM - no low BS reactions on metformin. A1c 6.2%. BS stable at home. She has ariva meter. Urine microalbumin/Cr ratio 22. LDL 135 (and not at goal due to statin intolerance)  HTN - stable on losartan HCT, amlodipine, coreg. She also takes ASA daily  Hyperlipidemia - stable. no myalgias. She is supposed to take crestor 1/2 tab on MWF due to previous rxn of myalgias but has been taking it only on MF. She does take daily zetia. LDL 135 (prev 111).  Seasonal allergy/asthma - stable with no exacerbations. [place] nasonex. She uses spiriva, symbicort, proventil hfa  Vaginal dryness - improved on premarin cream  GERD - controlled on zantac. Takes align.   She is UTD on most vaccinations. She still has not obtained zostavax     Past Medical History:  Diagnosis Date  . Acute upper respiratory infections of unspecified site   . Acute upper respiratory infections of  unspecified site   . Acute upper respiratory infections of unspecified site   . Allergic rhinitis due to pollen   . Candidiasis of mouth   . Disorder of bone and cartilage, unspecified   . Diverticulitis of colon (without mention of hemorrhage)(562.11)   . Essential hypertension, benign   . Essential hypertension, benign   . Extrinsic asthma, unspecified   . Herpes simplex with other ophthalmic complications   . Other abnormal glucose   . Other and unspecified hyperlipidemia   . Other and unspecified hyperlipidemia   . Other and unspecified hyperlipidemia   . Other cataract   . Other cataract   . Other dystrophy of vulva   . Other malaise and fatigue   . Rash and other nonspecific skin eruption   . Reflux esophagitis   . Thrombocytopenia, unspecified ([place]   . Type II or unspecified type diabetes mellitus without mention of complication, not stated as uncontrolled   . Type II or unspecified type diabetes mellitus without mention of complication, not stated as uncontrolled   . Unspecified disorder of skin and subcutaneous tissue   . Unspecified essential hypertension   . Unspecified essential hypertension     Past Surgical History:  Procedure Laterality Date  . ABDOMINAL HYSTERECTOMY    . CESAREAN SECTION     [phone] . CYST EXCISION  1974   Pilonidal Cyst excision  . EYE SURGERY Right 04/14/2017  Dr. Zadie Rhine  . KNEE ARTHROSCOPY Right    for meniscal tear  . RETINAL TEAR REPAIR CRYOTHERAPY  10/21/2017   Getting injections in right eye     reports that she quit smoking about 27 years ago. Her smoking use included cigarettes. She has never used smokeless tobacco. She reports that she does not drink alcohol or use drugs. Social History   Socioeconomic History  . Marital status: Married    Spouse name: Not on file  . Number of children: Not on file  . Years of education: Not on file  . Highest education level: Not on file  Occupational History  . Not on  file  Social Needs  . Financial resource strain: Not hard at all  . Food insecurity:    Worry: Never true    Inability: Never true  . Transportation needs:    Medical: Yes    Non-medical: Yes  Tobacco Use  . Smoking status: Former Smoker    Types: Cigarettes    Last attempt to quit: 12/13/1990    Years since quitting: 27.3  . Smokeless tobacco: Never Used  Substance and Sexual Activity  . Alcohol use: No    Alcohol/week: 0.0 oz  . Drug use: No  . Sexual activity: Yes    Partners: Male    Comment: None   Lifestyle  . Physical activity:    Days per week: 0 days    Minutes per session: 0 min  . Stress: Very much  Relationships  . Social connections:    Talks on phone: More than three times a week    Gets together: Once a week    Attends religious service: More than 4 times per year    Active member of club or organization: Yes    Attends meetings of clubs or organizations: Never    Relationship status: Married  . Intimate partner violence:    Fear of current or ex partner: No    Emotionally abused: No    Physically abused: No    Forced sexual activity: No  Other Topics Concern  . Not on file  Social History Narrative  . Not on file    Family History  Problem Relation Age of Onset  . Cancer Mother        brain  . Stroke Father     Allergies  Allergen Reactions  . Latex Other (See Comments)    GLOVES  . Sulfa Antibiotics     Outpatient Encounter Medications as of 04/21/2018  Medication Sig  . acetaminophen (TYLENOL) 325 MG tablet Take 325 mg by mouth daily as needed. For pain  . albuterol (VENTOLIN HFA) 108 (90 Base) MCG/ACT inhaler Inhale 2 puffs into the lungs every 6 (six) hours as needed for wheezing or shortness of breath.  . AMBULATORY NON FORMULARY MEDICATION Compression stockings: to both legs when out of bed, please remove before going to bed at night 1 pair  . amLODipine (NORVASC) 5 MG tablet Take 1 tablet (5 mg total) by mouth daily.  Marland Kitchen aspirin 81  MG tablet Take 81 mg by mouth daily.  . bifidobacterium infantis (ALIGN) capsule Take one tablet once a day  . brimonidine-timolol (COMBIGAN) 0.2-0.5 % ophthalmic solution Place 1 drop into the right eye 2 (two) times daily.  . budesonide-formoterol (SYMBICORT) 160-4.5 MCG/ACT inhaler Use 2 inhalations twice daily  . calcium-vitamin D (OSCAL WITH D) 500-200 MG-UNIT per tablet Take 1 tablet by mouth 2 (two) times daily.  . carvedilol (COREG)  25 MG tablet TAKE 1 TABLET TWICE A DAY FOR BLOOD PRESSURE  . conjugated estrogens (PREMARIN) vaginal cream Place vaginally daily.  Marland Kitchen ezetimibe (ZETIA) 10 MG tablet TAKE 1 TABLET BY MOUTH EVERY DAY  . fexofenadine (ALLEGRA) 180 MG tablet Take 180 mg by mouth daily.  . hydrocortisone (ANUSOL-HC) 2.5 % rectal cream Place 1 application rectally 2 (two) times daily as needed for hemorrhoids or itching.  Marland Kitchen KETOROLAC TROMETHAMINE OP Apply 1 drop to eye 4 (four) times daily. Right eye  . losartan (COZAAR) 100 MG tablet Take 1 tablet (100 mg total) by mouth daily.  . metFORMIN (GLUCOPHAGE) 500 MG tablet Take 1 tablet (500 mg total) by mouth daily with breakfast.  . mometasone (NASONEX) 50 MCG/ACT nasal spray One spray in each nostril twice daily as needed for allergies.  . Multiple Vitamin (MULTIVITAMIN) tablet Take 1 tablet by mouth daily.  . ranitidine (ZANTAC) 150 MG tablet Take 150 mg by mouth. Take one tablet by mouth daily in the morning.  . rosuvastatin (CRESTOR) 20 MG tablet Take one tablet three times a week, Monday, Wednesday and Friday  . tiotropium (SPIRIVA) 18 MCG inhalation capsule Place 1 capsule (18 mcg total) into inhaler and inhale daily.  . vitamin C (ASCORBIC ACID) 500 MG tablet Take 500 mg by mouth daily.   No facility-administered encounter medications on file as of 04/21/2018.     Review of Systems:  Review of Systems  Gastrointestinal: Positive for abdominal pain.  Genitourinary: Positive for frequency.  All other systems reviewed and are  negative.   Health Maintenance  Topic Date Due  . INFLUENZA VACCINE  07/13/2018  . OPHTHALMOLOGY EXAM  09/02/2018  . FOOT EXAM  09/06/2018  . HEMOGLOBIN A1C  10/20/2018  . TETANUS/TDAP  02/16/2022  . DEXA SCAN  Completed  . PNA vac Low Risk Adult  Completed    Physical Exam: Vitals:   04/21/18 0923  BP: 136/82  Pulse: 62  Temp: 97.9 F (36.6 C)  TempSrc: Oral  SpO2: 96%  Weight: 157 lb (71.2 kg)  Height: 5' 1"  (1.549 m)   Body mass index is 29.66 kg/m. Physical Exam  Constitutional: She is oriented to person, place, and time. She appears well-developed and well-nourished.  HENT:  Mouth/Throat: Oropharynx is clear and moist. No oropharyngeal exudate.  MMM; no oral thrush  Eyes: Pupils are equal, round, and reactive to light. No scleral icterus.  Neck: Neck supple. Carotid bruit is not present. No tracheal deviation present. No thyromegaly present.  Cardiovascular: Normal rate, regular rhythm and intact distal pulses. Exam reveals no gallop and no friction rub.  Murmur (1/6 SEM) heard. No LE edema b/l. no calf TTP.   Pulmonary/Chest: Effort normal and breath sounds normal. No stridor. No respiratory distress. She has no wheezes. She has no rales.  Abdominal: Soft. Normal appearance and bowel sounds are normal. She exhibits distension. She exhibits no mass. There is no hepatomegaly. There is tenderness (midepigastric; suprapubic ). There is no rigidity, no rebound and no guarding. No hernia.  Musculoskeletal: She exhibits edema.  Lymphadenopathy:    She has no cervical adenopathy.  Neurological: She is alert and oriented to person, place, and time. She has normal reflexes.  Skin: Skin is warm and dry. No rash noted.  Psychiatric: She has a normal mood and affect. Her behavior is normal. Judgment and thought content normal.    Labs reviewed: Basic Metabolic Panel: Recent Labs    06/06/17 1056 09/02/17 0850 12/14/17 0839 04/19/18 4239  NA 141 139 140 142  K 4.6 3.9  4.1 4.6  CL 105 104 105 104  CO2 27 27 29 29   GLUCOSE 113* 132* 130* 112*  BUN 12 12 18 14   CREATININE 0.65 0.73 0.79 0.76  CALCIUM 9.5 9.2 9.3 9.8  TSH 1.69  --   --   --    Liver Function Tests: Recent Labs    06/06/17 1056 09/02/17 0850 12/14/17 0839 04/19/18 0828  AST 13  --   --  11  ALT 13 12 10 10   ALKPHOS 61  --   --   --   BILITOT 0.5  --   --  0.6  PROT 7.2  --   --  7.5  ALBUMIN 3.9  --   --   --    No results for input(s): LIPASE, AMYLASE in the last 8760 hours. No results for input(s): AMMONIA in the last 8760 hours. CBC: Recent Labs    06/06/17 1056  WBC 5.1  NEUTROABS 1,632  HGB 13.6  HCT 41.5  MCV 78.3*  PLT 190   Lipid Panel: Recent Labs    09/02/17 0850 12/14/17 0839 04/19/18 0828  CHOL 178 178 206*  HDL 62 53 55  LDLCALC 101* 111* 135*  TRIG 67 56 65  CHOLHDL 2.9 3.4 3.7   Lab Results  Component Value Date   HGBA1C 6.2 (H) 04/19/2018    Procedures since last visit: No results found.  Assessment/Plan   ICD-10-CM   1. Nocturia R35.1   2. Urinary frequency R35.0 Urinalysis with Reflex Microscopic  3. Epigastric pain R10.13   4. Controlled type 2 diabetes mellitus without complication, without long-term current use of insulin (HCC) E11.9 BMP with eGFR([facility])    ALT    Hemoglobin A1c  5. Hyperlipidemia LDL goal <100 E78.5 Lipid Panel  6. Essential hypertension I10   7. Diverticulitis of colon (without mention of hemorrhage)(562.11) K57.32 ciprofloxacin (CIPRO) 500 MG tablet   clinical dx   START CIPRO 500MG 2 TIMES DAILY X 7 DAYS FOR DIVERTICULITIS  Take probiotic daily while on antibiotic  Will call with urine results  Continue other medications as ordered  Follow up in 3 mos for DM, hyperlipidemia, HTN, GERD. Fasting labs prior to appt    Lindon S. Perlie Gold  [facility] [address] [place], [place] [address] [phone] Cell (Monday-Friday 8 AM - 5  PM) [phone] After 5 PM and follow prompts

## 2018-04-24 ENCOUNTER — Encounter: Payer: Self-pay | Admitting: Internal Medicine

## 2018-05-01 ENCOUNTER — Telehealth: Payer: Self-pay | Admitting: *Deleted

## 2018-05-01 NOTE — Telephone Encounter (Signed)
Recommend OTC gas X

## 2018-05-01 NOTE — Telephone Encounter (Signed)
Patient notified and agreed.  

## 2018-05-01 NOTE — Telephone Encounter (Signed)
Patient called and stated that she just saw you on 04/21/18 and given an antibiotic which she has completed. Patient is having a lot of gas and would like to know if there is something you can prescribe. Please Advise.

## 2018-05-03 ENCOUNTER — Ambulatory Visit
Admission: RE | Admit: 2018-05-03 | Discharge: 2018-05-03 | Disposition: A | Discharge status: Home / Self Care | Payer: Medicare Other | Source: Ambulatory Visit | Attending: Gastroenterology | Admitting: Gastroenterology

## 2018-05-03 ENCOUNTER — Other Ambulatory Visit: Payer: Self-pay | Admitting: Gastroenterology

## 2018-05-03 DIAGNOSIS — R140 Abdominal distension (gaseous): Secondary | ICD-10-CM

## 2018-05-03 DIAGNOSIS — R1010 Upper abdominal pain, unspecified: Secondary | ICD-10-CM | POA: Diagnosis not present

## 2018-05-03 DIAGNOSIS — K219 Gastro-esophageal reflux disease without esophagitis: Secondary | ICD-10-CM | POA: Diagnosis not present

## 2018-05-03 DIAGNOSIS — K5900 Constipation, unspecified: Secondary | ICD-10-CM | POA: Diagnosis not present

## 2018-05-16 ENCOUNTER — Other Ambulatory Visit: Payer: Self-pay | Admitting: *Deleted

## 2018-05-16 MED ORDER — ROSUVASTATIN CALCIUM 20 MG PO TABS: ORAL_TABLET | ORAL | 3 refills | Status: DC

## 2018-05-16 NOTE — Telephone Encounter (Signed)
Patient called requesting refill. Would like for it to be sent to [facility].

## 2018-05-18 DIAGNOSIS — H3521 Other non-diabetic proliferative retinopathy, right eye: Secondary | ICD-10-CM | POA: Diagnosis not present

## 2018-05-18 DIAGNOSIS — E119 Type 2 diabetes mellitus without complications: Secondary | ICD-10-CM | POA: Diagnosis not present

## 2018-05-18 DIAGNOSIS — H35351 Cystoid macular degeneration, right eye: Secondary | ICD-10-CM | POA: Diagnosis not present

## 2018-05-18 DIAGNOSIS — H43822 Vitreomacular adhesion, left eye: Secondary | ICD-10-CM | POA: Diagnosis not present

## 2018-05-18 DIAGNOSIS — Z8669 Personal history of other diseases of the nervous system and sense organs: Secondary | ICD-10-CM | POA: Diagnosis not present

## 2018-05-18 DIAGNOSIS — H35371 Puckering of macula, right eye: Secondary | ICD-10-CM | POA: Diagnosis not present

## 2018-05-18 DIAGNOSIS — D573 Sickle-cell trait: Secondary | ICD-10-CM | POA: Diagnosis not present

## 2018-06-07 DIAGNOSIS — R140 Abdominal distension (gaseous): Secondary | ICD-10-CM | POA: Diagnosis not present

## 2018-06-07 DIAGNOSIS — K589 Irritable bowel syndrome without diarrhea: Secondary | ICD-10-CM | POA: Diagnosis not present

## 2018-07-24 ENCOUNTER — Other Ambulatory Visit: Payer: Medicare Other

## 2018-07-24 DIAGNOSIS — E785 Hyperlipidemia, unspecified: Secondary | ICD-10-CM

## 2018-07-24 DIAGNOSIS — E119 Type 2 diabetes mellitus without complications: Secondary | ICD-10-CM | POA: Diagnosis not present

## 2018-07-25 LAB — BASIC METABOLIC PANEL WITH GFR
BUN: 14 mg/dL (ref 7–25)
CALCIUM: 9.6 mg/dL (ref 8.6–10.4)
CHLORIDE: 105 mmol/L (ref 98–110)
CO2: 29 mmol/L (ref 20–32)
Creat: 0.65 mg/dL (ref 0.60–0.93)
GFR, Est African American: 100 mL/min/{1.73_m2} (ref >=60)
GFR, Est Non African American: 86 mL/min/{1.73_m2} (ref >=60)
Glucose, Bld: 125 mg/dL — ABNORMAL HIGH (ref 65–99)
Potassium: 4 mmol/L (ref 3.5–5.3)
Sodium: 141 mmol/L (ref 135–146)

## 2018-07-25 LAB — ALT: ALT: 11 U/L (ref 6–29)

## 2018-07-25 LAB — LIPID PANEL
Cholesterol: 159 mg/dL (ref <200)
HDL: 53 mg/dL (ref >50)
LDL Cholesterol (Calc): 92 mg/dL (calc)
Non-HDL Cholesterol (Calc): 106 mg/dL (calc) (ref <130)
Total CHOL/HDL Ratio: 3 (calc) (ref <5.0)
Triglycerides: 56 mg/dL (ref <150)

## 2018-07-25 LAB — HEMOGLOBIN A1C
EAG (MMOL/L): 7.4 (calc)
HEMOGLOBIN A1C: 6.3 %{Hb} — AB (ref <5.7)
MEAN PLASMA GLUCOSE: 134 (calc)

## 2018-07-26 ENCOUNTER — Ambulatory Visit (INDEPENDENT_AMBULATORY_CARE_PROVIDER_SITE_OTHER): Payer: Medicare Other | Admitting: Internal Medicine

## 2018-07-26 VITALS — BP 130/78 | HR 56 | Temp 98.0°F | Resp 10 | Ht 61.0 in | Wt 154.0 lb

## 2018-07-26 DIAGNOSIS — E1159 Type 2 diabetes mellitus with other circulatory complications: Secondary | ICD-10-CM | POA: Diagnosis not present

## 2018-07-26 DIAGNOSIS — E1169 Type 2 diabetes mellitus with other specified complication: Secondary | ICD-10-CM | POA: Diagnosis not present

## 2018-07-26 DIAGNOSIS — E785 Hyperlipidemia, unspecified: Secondary | ICD-10-CM

## 2018-07-26 DIAGNOSIS — J45909 Unspecified asthma, uncomplicated: Secondary | ICD-10-CM

## 2018-07-26 DIAGNOSIS — E119 Type 2 diabetes mellitus without complications: Secondary | ICD-10-CM

## 2018-07-26 DIAGNOSIS — J0100 Acute maxillary sinusitis, unspecified: Secondary | ICD-10-CM | POA: Diagnosis not present

## 2018-07-26 DIAGNOSIS — I10 Essential (primary) hypertension: Secondary | ICD-10-CM

## 2018-07-26 DIAGNOSIS — J301 Allergic rhinitis due to pollen: Secondary | ICD-10-CM | POA: Diagnosis not present

## 2018-07-26 DIAGNOSIS — I152 Hypertension secondary to endocrine disorders: Secondary | ICD-10-CM

## 2018-07-26 MED ORDER — CEFDINIR 300 MG PO CAPS: 300.0000 mg | ORAL_CAPSULE | Freq: Two times a day (BID) | ORAL | 0 refills | Status: DC

## 2018-07-26 MED ORDER — ALBUTEROL SULFATE HFA 108 (90 BASE) MCG/ACT IN AERS: 2.0000 | INHALATION_SPRAY | Freq: Four times a day (QID) | RESPIRATORY_TRACT | 5 refills | Status: DC | PRN

## 2018-07-26 NOTE — Progress Notes (Signed)
Patient ID: Tanya Bell, female   DOB: 1940/12/28, 77 y.o.   MRN: [id number]   Location:  [facility] OFFICE  Provider: DR Arletha Grippe  Code Status:  Goals of Care:  Advanced Directives 04/21/2018  Does Patient Have a Medical Advance Directive? No  Does patient want to make changes to medical advance directive? Yes (MAU/Ambulatory/Procedural Areas - Information given)  Would patient like information on creating a medical advance directive? -     Chief Complaint  Patient presents with  . Medical Management of Chronic Issues    3 month follow-up on DM, hyperlipidemia, HTN, GERD. Discuss labs (copy given to patient).  . Sinus Problem    Patient c/o sinus issuce x 2 weeks, tried OTC medication   . Screening    Audit C Screening neg     HPI: Patient is a 77 y.o. female seen today for medical management of chronic diseases.  Pt c/o 2 week hx sinus congestion, post nasal drip, HA, right otalgia, sore throat, wheezing, cough. Tried OTC coricidan and allegra without relief. Currently taking mucinex which helps with chest congestion. She saw GI Dr Therisa Doyne at [facility] for increased GI sx's. She was dx with IBS and Rx Linzess 120mcg which caused abdominal cramping. She stopped linzess and is currently taking magnesium caps qhs. She has not needed pantoprazole daily. She consumes a high fiber diet.  She also c/o nocturia (gets up every 24min frequently but prior to that she went 4 times per night) x several days.  DM - no low BS reactions on metformin. A1c 6.3%. BS stable at home stable - fasting BS 102 today. She has ariva meter. Urine microalbumin/Cr ratio 22. LDL 92  HTN - stable on losartan HCT, amlodipine, coreg. She also takes ASA daily  Hyperlipidemia - stable. no myalgias. She takes crestor 1/2 tab on MWF due to previous rxn of myalgias. She does take daily zetia. LDL 92 (prev 135).  Seasonal allergy/asthma - stable with no exacerbations. Last PFTs several years ago in [place]. Stable on  nasonex. She uses spiriva, symbicort, proventil hfa  Vaginal dryness - improved on premarin cream  GERD - controlled on zantac. Takes align.   She is UTD on most vaccinations. She still has not obtained zostavax    Past Medical History:  Diagnosis Date  . Acute upper respiratory infections of unspecified site   . Acute upper respiratory infections of unspecified site   . Acute upper respiratory infections of unspecified site   . Allergic rhinitis due to pollen   . Candidiasis of mouth   . Disorder of bone and cartilage, unspecified   . Diverticulitis of colon (without mention of hemorrhage)(562.11)   . Essential hypertension, benign   . Essential hypertension, benign   . Extrinsic asthma, unspecified   . Herpes simplex with other ophthalmic complications   . Other abnormal glucose   . Other and unspecified hyperlipidemia   . Other and unspecified hyperlipidemia   . Other and unspecified hyperlipidemia   . Other cataract   . Other cataract   . Other dystrophy of vulva   . Other malaise and fatigue   . Rash and other nonspecific skin eruption   . Reflux esophagitis   . Thrombocytopenia, unspecified ([place])   . Type II or unspecified type diabetes mellitus without mention of complication, not stated as uncontrolled   . Type II or unspecified type diabetes mellitus without mention of complication, not stated as uncontrolled   . Unspecified disorder of  skin and subcutaneous tissue   . Unspecified essential hypertension   . Unspecified essential hypertension     Past Surgical History:  Procedure Laterality Date  . ABDOMINAL HYSTERECTOMY    . CESAREAN SECTION     [phone]  . CYST EXCISION  1974   Pilonidal Cyst excision  . EYE SURGERY Right 04/14/2017   Dr. Zadie Rhine  . KNEE ARTHROSCOPY Right    for meniscal tear  . RETINAL TEAR REPAIR CRYOTHERAPY  10/21/2017   Getting injections in right eye     reports that she quit smoking about 27 years ago. Her smoking use  included cigarettes. She has never used smokeless tobacco. She reports that she does not drink alcohol or use drugs. Social History   Socioeconomic History  . Marital status: Married    Spouse name: Not on file  . Number of children: Not on file  . Years of education: Not on file  . Highest education level: Not on file  Occupational History  . Not on file  Social Needs  . Financial resource strain: Not hard at all  . Food insecurity:    Worry: Never true    Inability: Never true  . Transportation needs:    Medical: Yes    Non-medical: Yes  Tobacco Use  . Smoking status: Former Smoker    Types: Cigarettes    Last attempt to quit: 12/13/1990    Years since quitting: 27.6  . Smokeless tobacco: Never Used  Substance and Sexual Activity  . Alcohol use: No    Alcohol/week: 0.0 standard drinks  . Drug use: No  . Sexual activity: Yes    Partners: Male    Comment: None   Lifestyle  . Physical activity:    Days per week: 0 days    Minutes per session: 0 min  . Stress: Very much  Relationships  . Social connections:    Talks on phone: More than three times a week    Gets together: Once a week    Attends religious service: More than 4 times per year    Active member of club or organization: Yes    Attends meetings of clubs or organizations: Never    Relationship status: Married  . Intimate partner violence:    Fear of current or ex partner: No    Emotionally abused: No    Physically abused: No    Forced sexual activity: No  Other Topics Concern  . Not on file  Social History Narrative  . Not on file    Family History  Problem Relation Age of Onset  . Cancer Mother        brain  . Stroke Father     Allergies  Allergen Reactions  . Crestor [Rosuvastatin Calcium]     Muscle cramps  . Latex Other (See Comments)    GLOVES  . Sulfa Antibiotics     Outpatient Encounter Medications as of 07/26/2018  Medication Sig  . acetaminophen (TYLENOL) 325 MG tablet Take 325 mg  by mouth daily as needed. For pain  . albuterol (VENTOLIN HFA) 108 (90 Base) MCG/ACT inhaler Inhale 2 puffs into the lungs every 6 (six) hours as needed for wheezing or shortness of breath.  . AMBULATORY NON FORMULARY MEDICATION Compression stockings: to both legs when out of bed, please remove before going to bed at night 1 pair  . amLODipine (NORVASC) 5 MG tablet Take 1 tablet (5 mg total) by mouth daily.  . bifidobacterium infantis (ALIGN) capsule  Take one tablet once a day  . brimonidine-timolol (COMBIGAN) 0.2-0.5 % ophthalmic solution Place 1 drop into the right eye 2 (two) times daily.  . budesonide-formoterol (SYMBICORT) 160-4.5 MCG/ACT inhaler Use 2 inhalations twice daily  . calcium-vitamin D (OSCAL WITH D) 500-200 MG-UNIT per tablet Take 1 tablet by mouth 2 (two) times daily.  . carvedilol (COREG) 25 MG tablet TAKE 1 TABLET TWICE A DAY FOR BLOOD PRESSURE  . ezetimibe (ZETIA) 10 MG tablet TAKE 1 TABLET BY MOUTH EVERY DAY  . fexofenadine (ALLEGRA) 180 MG tablet Take 180 mg by mouth daily.  . hydrocortisone (ANUSOL-HC) 2.5 % rectal cream Place 1 application rectally 2 (two) times daily as needed for hemorrhoids or itching.  Marland Kitchen KETOROLAC TROMETHAMINE OP Apply 1 drop to eye 4 (four) times daily. Right eye  . losartan (COZAAR) 100 MG tablet Take 1 tablet (100 mg total) by mouth daily.  . metFORMIN (GLUCOPHAGE) 500 MG tablet Take 1 tablet (500 mg total) by mouth daily with breakfast.  . mometasone (NASONEX) 50 MCG/ACT nasal spray One spray in each nostril twice daily as needed for allergies.  . Multiple Vitamin (MULTIVITAMIN) tablet Take 1 tablet by mouth daily.  . ranitidine (ZANTAC) 150 MG tablet Take 150 mg by mouth daily as needed for heartburn.  . rosuvastatin (CRESTOR) 20 MG tablet Take one tablet three times a week, Monday, Wednesday and Friday  . tiotropium (SPIRIVA) 18 MCG inhalation capsule Place 1 capsule (18 mcg total) into inhaler and inhale daily.  . vitamin C (ASCORBIC ACID) 500  MG tablet Take 500 mg by mouth daily.  Marland Kitchen aspirin 81 MG tablet Take 81 mg by mouth daily.  . [DISCONTINUED] ciprofloxacin (CIPRO) 500 MG tablet Take 1 tablet (500 mg total) by mouth 2 (two) times daily. (Patient not taking: Reported on 07/26/2018)  . [DISCONTINUED] conjugated estrogens (PREMARIN) vaginal cream Place vaginally daily. (Patient not taking: Reported on 07/26/2018)  . [DISCONTINUED] ranitidine (ZANTAC) 150 MG tablet Take 150 mg by mouth. Take one tablet by mouth daily in the morning.   No facility-administered encounter medications on file as of 07/26/2018.     Review of Systems:  Review of Systems  HENT: Positive for ear pain, postnasal drip, sinus pressure and sore throat.   Respiratory: Positive for cough and wheezing.   Gastrointestinal: Positive for abdominal pain and constipation (intermittent).  Neurological: Positive for headaches.  All other systems reviewed and are negative.   Health Maintenance  Topic Date Due  . INFLUENZA VACCINE  07/13/2018  . OPHTHALMOLOGY EXAM  09/02/2018  . FOOT EXAM  09/06/2018  . HEMOGLOBIN A1C  01/24/2019  . TETANUS/TDAP  02/16/2022  . DEXA SCAN  Completed  . PNA vac Low Risk Adult  Completed    Physical Exam: Vitals:   07/26/18 0918  BP: 130/78  Pulse: (!) 56  Resp: 10  Temp: 98 F (36.7 C)  TempSrc: Oral  SpO2: 94%  Weight: 154 lb (69.9 kg)  Height: 5\' 1"  (1.549 m)   Body mass index is 29.1 kg/m. Physical Exam  Constitutional: She is oriented to person, place, and time. She appears well-developed and well-nourished.  HENT:  Nose: Right sinus exhibits maxillary sinus tenderness (boggy tissue texture changes). Left sinus exhibits maxillary sinus tenderness (boggu tissue texture changes).  Mouth/Throat: Oropharynx is clear and moist. No oropharyngeal exudate.  MMM; no oral thrush  Eyes: Pupils are equal, round, and reactive to light. No scleral icterus.  Right upper lid droopy  Neck: Neck supple. Carotid bruit  is not  present. No tracheal deviation present. No thyromegaly present.  Cardiovascular: Normal rate, regular rhythm and intact distal pulses. Exam reveals no gallop and no friction rub.  Murmur (1/6 SEM) heard. No LE edema b/l. no calf TTP.   Pulmonary/Chest: Effort normal. No stridor. No respiratory distress. She has wheezes (end expiratory with prolonged expiratory phase). She has no rales.  Abdominal: Soft. Normal appearance and bowel sounds are normal. She exhibits distension. She exhibits no mass. There is no hepatomegaly. There is tenderness (epigastric). There is no rigidity, no rebound and no guarding. No hernia.  Musculoskeletal: She exhibits edema (small and large joints).       Right foot: There is deformity (bunion).       Left foot: There is deformity (bunion).  Lymphadenopathy:    She has no cervical adenopathy.  Neurological: She is alert and oriented to person, place, and time. She has normal reflexes.  Skin: Skin is warm and dry. No rash noted.  Psychiatric: She has a normal mood and affect. Her behavior is normal. Judgment and thought content normal.    Labs reviewed: Basic Metabolic Panel: Recent Labs    12/14/17 0839 04/19/18 0828 07/24/18 0848  NA 140 142 141  K 4.1 4.6 4.0  CL 105 104 105  CO2 29 29 29   GLUCOSE 130* 112* 125*  BUN 18 14 14   CREATININE 0.79 0.76 0.65  CALCIUM 9.3 9.8 9.6   Liver Function Tests: Recent Labs    12/14/17 0839 04/19/18 0828 07/24/18 0848  AST  --  11  --   ALT 10 10 11   BILITOT  --  0.6  --   PROT  --  7.5  --    No results for input(s): LIPASE, AMYLASE in the last 8760 hours. No results for input(s): AMMONIA in the last 8760 hours. CBC: No results for input(s): WBC, NEUTROABS, HGB, HCT, MCV, PLT in the last 8760 hours. Lipid Panel: Recent Labs    12/14/17 0839 04/19/18 0828 07/24/18 0848  CHOL 178 206* 159  HDL 53 55 53  LDLCALC 111* 135* 92  TRIG 56 65 56  CHOLHDL 3.4 3.7 3.0   Lab Results  Component Value Date     HGBA1C 6.3 (H) 07/24/2018    Procedures since last visit: No results found.  Assessment/Plan     ICD-10-CM   1. Acute maxillary sinusitis, recurrence not specified J01.00 cefdinir (OMNICEF) 300 MG capsule  2. Extrinsic asthma, unspecified asthma severity, uncomplicated O67.124 albuterol (VENTOLIN HFA) 108 (90 Base) MCG/ACT inhaler  3. Controlled type 2 diabetes mellitus without complication, without long-term current use of insulin (HCC) E11.9 Microalbumin/Creatinine Ratio, Urine  4. Hyperlipidemia associated with type 2 diabetes mellitus ([place]) E11.69    E78.5   5. Hypertension associated with diabetes ([place]) E11.59    I10   6. Seasonal allergic rhinitis due to pollen J30.1    START OMNICEF 300MG  2 TIMES DAILY X 10 DAYS  TAKE PROBIOTIC DAILY TO KEEP COLON HEALTHY WHILE ON ANTIBIOTIC  Push fluids and rest  MAY CONTINUE MUCINEX OR CORICIDAN FOR COUGH/CONGESTION + PLAIN ALLEGRA DAILY  Continue other medications as ordered  Follow up with eye specialists as scheduled  Will call with urine results  Follow up in 3 mos with Janett Billow for DM, HTN, seasonal allergy, GERD/IBS. Fasting labs prior to appt (cmp, lipid panel, a1c, TSH, cbc w diff)   [facility]  [place], [place] [address] [phone] Cell (Monday-Friday 8 AM - 5 PM) [phone] After 5 PM and follow prompts

## 2018-07-26 NOTE — Patient Instructions (Addendum)
START OMNICEF 300MG  2 TIMES DAILY X 10 DAYS  TAKE PROBIOTIC DAILY TO KEEP COLON HEALTHY WHILE ON ANTIBIOTIC  Push fluids and rest  MAY CONTINUE MUCINEX OR CORICIDAN FOR COUGH/CONGESTION + PLAIN ALLEGRA DAILY  Continue other medications as ordered  Follow up with eye specialists as scheduled  Will call with urine results  Follow up in 3 mos with Janett Billow for DM, HTN, seasonal allergy, GERD/IBS. Fasting labs orior to tappt

## 2018-07-27 ENCOUNTER — Encounter: Payer: Self-pay | Admitting: Internal Medicine

## 2018-07-27 DIAGNOSIS — H43822 Vitreomacular adhesion, left eye: Secondary | ICD-10-CM | POA: Diagnosis not present

## 2018-07-27 DIAGNOSIS — E119 Type 2 diabetes mellitus without complications: Secondary | ICD-10-CM | POA: Diagnosis not present

## 2018-07-27 DIAGNOSIS — H35351 Cystoid macular degeneration, right eye: Secondary | ICD-10-CM | POA: Diagnosis not present

## 2018-07-27 DIAGNOSIS — H35371 Puckering of macula, right eye: Secondary | ICD-10-CM | POA: Diagnosis not present

## 2018-07-27 DIAGNOSIS — H3521 Other non-diabetic proliferative retinopathy, right eye: Secondary | ICD-10-CM | POA: Diagnosis not present

## 2018-07-27 DIAGNOSIS — Z8669 Personal history of other diseases of the nervous system and sense organs: Secondary | ICD-10-CM | POA: Diagnosis not present

## 2018-07-27 DIAGNOSIS — D573 Sickle-cell trait: Secondary | ICD-10-CM | POA: Diagnosis not present

## 2018-07-27 DIAGNOSIS — H35372 Puckering of macula, left eye: Secondary | ICD-10-CM | POA: Diagnosis not present

## 2018-07-27 LAB — MICROALBUMIN / CREATININE URINE RATIO
CREATININE, URINE: 138 mg/dL (ref 20–275)
MICROALB UR: 2.1 mg/dL
MICROALB/CREAT RATIO: 15 ug/mg{creat} (ref <30)

## 2018-08-02 ENCOUNTER — Encounter: Payer: Self-pay | Admitting: Internal Medicine

## 2018-08-04 ENCOUNTER — Other Ambulatory Visit: Payer: Self-pay | Admitting: *Deleted

## 2018-08-04 DIAGNOSIS — I10 Essential (primary) hypertension: Secondary | ICD-10-CM

## 2018-08-04 MED ORDER — AMLODIPINE BESYLATE 5 MG PO TABS: 5.0000 mg | ORAL_TABLET | Freq: Every day | ORAL | 1 refills | Status: DC

## 2018-08-04 MED ORDER — CARVEDILOL 25 MG PO TABS: ORAL_TABLET | ORAL | 1 refills | Status: DC

## 2018-08-07 ENCOUNTER — Other Ambulatory Visit: Payer: Self-pay | Admitting: *Deleted

## 2018-08-07 DIAGNOSIS — I10 Essential (primary) hypertension: Secondary | ICD-10-CM

## 2018-08-07 MED ORDER — CARVEDILOL 25 MG PO TABS: ORAL_TABLET | ORAL | 0 refills | Status: DC

## 2018-08-07 NOTE — Telephone Encounter (Signed)
Patient requested supply to be sent to local pharmacy until she can receive mail order.

## 2018-08-08 ENCOUNTER — Other Ambulatory Visit: Payer: Self-pay

## 2018-08-08 DIAGNOSIS — I152 Hypertension secondary to endocrine disorders: Secondary | ICD-10-CM

## 2018-08-08 DIAGNOSIS — E1159 Type 2 diabetes mellitus with other circulatory complications: Secondary | ICD-10-CM

## 2018-08-08 DIAGNOSIS — E785 Hyperlipidemia, unspecified: Secondary | ICD-10-CM

## 2018-08-08 DIAGNOSIS — E1169 Type 2 diabetes mellitus with other specified complication: Secondary | ICD-10-CM

## 2018-08-08 DIAGNOSIS — I10 Essential (primary) hypertension: Secondary | ICD-10-CM

## 2018-08-08 DIAGNOSIS — E119 Type 2 diabetes mellitus without complications: Secondary | ICD-10-CM

## 2018-08-16 ENCOUNTER — Ambulatory Visit (INDEPENDENT_AMBULATORY_CARE_PROVIDER_SITE_OTHER): Payer: Medicare Other

## 2018-08-16 DIAGNOSIS — Z23 Encounter for immunization: Secondary | ICD-10-CM

## 2018-09-27 DIAGNOSIS — R1084 Generalized abdominal pain: Secondary | ICD-10-CM | POA: Diagnosis not present

## 2018-10-09 ENCOUNTER — Other Ambulatory Visit: Payer: Self-pay | Admitting: *Deleted

## 2018-10-09 MED ORDER — LOSARTAN POTASSIUM 100 MG PO TABS: 100.0000 mg | ORAL_TABLET | Freq: Every day | ORAL | 0 refills | Status: DC

## 2018-10-09 MED ORDER — LOSARTAN POTASSIUM 100 MG PO TABS: 100.0000 mg | ORAL_TABLET | Freq: Every day | ORAL | 1 refills | Status: DC

## 2018-10-09 NOTE — Telephone Encounter (Signed)
Patient requested refill

## 2018-10-20 ENCOUNTER — Other Ambulatory Visit: Payer: Medicare Other

## 2018-10-20 DIAGNOSIS — E119 Type 2 diabetes mellitus without complications: Secondary | ICD-10-CM | POA: Diagnosis not present

## 2018-10-20 DIAGNOSIS — E785 Hyperlipidemia, unspecified: Secondary | ICD-10-CM

## 2018-10-20 DIAGNOSIS — I152 Hypertension secondary to endocrine disorders: Secondary | ICD-10-CM

## 2018-10-20 DIAGNOSIS — E1159 Type 2 diabetes mellitus with other circulatory complications: Secondary | ICD-10-CM | POA: Diagnosis not present

## 2018-10-20 DIAGNOSIS — E1169 Type 2 diabetes mellitus with other specified complication: Secondary | ICD-10-CM

## 2018-10-20 DIAGNOSIS — I10 Essential (primary) hypertension: Secondary | ICD-10-CM | POA: Diagnosis not present

## 2018-10-21 LAB — COMPLETE METABOLIC PANEL WITH GFR
AG RATIO: 1.3 (calc) (ref 1.0–2.5)
ALT: 11 U/L (ref 6–29)
AST: 13 U/L (ref 10–35)
Albumin: 4 g/dL (ref 3.6–5.1)
Alkaline phosphatase (APISO): 56 U/L (ref 33–130)
BUN: 13 mg/dL (ref 7–25)
CALCIUM: 9.6 mg/dL (ref 8.6–10.4)
CO2: 32 mmol/L (ref 20–32)
Chloride: 103 mmol/L (ref 98–110)
Creat: 0.69 mg/dL (ref 0.60–0.93)
GFR, EST NON AFRICAN AMERICAN: 85 mL/min/{1.73_m2} (ref >=60)
GFR, Est African American: 98 mL/min/{1.73_m2} (ref >=60)
GLOBULIN: 3.1 g/dL (ref 1.9–3.7)
Glucose, Bld: 114 mg/dL — ABNORMAL HIGH (ref 65–99)
POTASSIUM: 3.9 mmol/L (ref 3.5–5.3)
SODIUM: 140 mmol/L (ref 135–146)
TOTAL PROTEIN: 7.1 g/dL (ref 6.1–8.1)
Total Bilirubin: 0.5 mg/dL (ref 0.2–1.2)

## 2018-10-21 LAB — LIPID PANEL
Cholesterol: 159 mg/dL (ref <200)
HDL: 55 mg/dL (ref >50)
LDL Cholesterol (Calc): 90 mg/dL (calc)
NON-HDL CHOLESTEROL (CALC): 104 mg/dL (ref <130)
Total CHOL/HDL Ratio: 2.9 (calc) (ref <5.0)
Triglycerides: 54 mg/dL (ref <150)

## 2018-10-21 LAB — CBC WITH DIFFERENTIAL/PLATELET
Basophils Absolute: 38 cells/uL (ref 0–200)
Basophils Relative: 0.7 %
EOS PCT: 3.9 %
Eosinophils Absolute: 211 cells/uL (ref 15–500)
HEMATOCRIT: 39.8 % (ref 35.0–45.0)
HEMOGLOBIN: 13.1 g/dL (ref 11.7–15.5)
LYMPHS ABS: 2495 {cells}/uL (ref 850–3900)
MCH: 25.9 pg — ABNORMAL LOW (ref 27.0–33.0)
MCHC: 32.9 g/dL (ref 32.0–36.0)
MCV: 78.8 fL — ABNORMAL LOW (ref 80.0–100.0)
MPV: 12.5 fL (ref 7.5–12.5)
Monocytes Relative: 9.5 %
NEUTROS PCT: 39.7 %
Neutro Abs: 2144 cells/uL (ref 1500–7800)
Platelets: 190 10*3/uL (ref 140–400)
RBC: 5.05 10*6/uL (ref 3.80–5.10)
RDW: 16.2 % — AB (ref 11.0–15.0)
Total Lymphocyte: 46.2 %
WBC mixed population: 513 cells/uL (ref 200–950)
WBC: 5.4 10*3/uL (ref 3.8–10.8)

## 2018-10-21 LAB — HEMOGLOBIN A1C
EAG (MMOL/L): 7.4 (calc)
HEMOGLOBIN A1C: 6.3 %{Hb} — AB (ref <5.7)
MEAN PLASMA GLUCOSE: 134 (calc)

## 2018-10-21 LAB — TSH: TSH: 1.88 mIU/L (ref 0.40–4.50)

## 2018-10-24 ENCOUNTER — Ambulatory Visit (INDEPENDENT_AMBULATORY_CARE_PROVIDER_SITE_OTHER): Payer: Medicare Other | Admitting: Nurse Practitioner

## 2018-10-24 ENCOUNTER — Encounter: Payer: Self-pay | Admitting: Nurse Practitioner

## 2018-10-24 VITALS — BP 126/74 | HR 62 | Temp 97.8°F | Ht 61.0 in | Wt 152.0 lb

## 2018-10-24 DIAGNOSIS — K649 Unspecified hemorrhoids: Secondary | ICD-10-CM

## 2018-10-24 DIAGNOSIS — H3530 Unspecified macular degeneration: Secondary | ICD-10-CM

## 2018-10-24 DIAGNOSIS — J301 Allergic rhinitis due to pollen: Secondary | ICD-10-CM | POA: Diagnosis not present

## 2018-10-24 DIAGNOSIS — E119 Type 2 diabetes mellitus without complications: Secondary | ICD-10-CM | POA: Diagnosis not present

## 2018-10-24 DIAGNOSIS — I10 Essential (primary) hypertension: Secondary | ICD-10-CM

## 2018-10-24 DIAGNOSIS — J45909 Unspecified asthma, uncomplicated: Secondary | ICD-10-CM | POA: Diagnosis not present

## 2018-10-24 DIAGNOSIS — E785 Hyperlipidemia, unspecified: Secondary | ICD-10-CM

## 2018-10-24 DIAGNOSIS — E1169 Type 2 diabetes mellitus with other specified complication: Secondary | ICD-10-CM

## 2018-10-24 MED ORDER — ZOSTER VAC RECOMB ADJUVANTED 50 MCG/0.5ML IM SUSR: 0.5000 mL | Freq: Once | INTRAMUSCULAR | 1 refills | Status: AC

## 2018-10-24 MED ORDER — HYDROCORTISONE 2.5 % RE CREA: 1.0000 "application " | TOPICAL_CREAM | Freq: Two times a day (BID) | RECTAL | 0 refills | Status: DC | PRN

## 2018-10-24 MED ORDER — BUDESONIDE-FORMOTEROL FUMARATE 160-4.5 MCG/ACT IN AERO: INHALATION_SPRAY | RESPIRATORY_TRACT | 1 refills | Status: DC

## 2018-10-24 NOTE — Patient Instructions (Addendum)
BRING ALL MEDICATIONS TO NEXT OFFICE VISIT   Follow up in 6 months with lab work before visit

## 2018-10-24 NOTE — Progress Notes (Signed)
Careteam: Tanya Bell Care Team: [name], NP as PCP - General (Geriatric Medicine) Teena Irani, MD (Inactive) as Consulting Physician (Gastroenterology) Warden Fillers, MD as Consulting Physician (Ophthalmology) [name], MD as Consulting Physician (Ophthalmology) Mickel Fuchs, MD as Referring Physician (Psychiatry)  Advanced Directive information Does Tanya Bell Have a Medical Advance Directive?: No  Allergies  Allergen Reactions  . Crestor [Rosuvastatin Calcium]     Muscle cramps  . Latex Other (See Comments)    GLOVES  . Sulfa Antibiotics     Chief Complaint  Tanya Bell presents with  . Medical Management of Chronic Issues    Pt is being seen for a 3 month routine visit. Pt has no concerns today.   Marland Kitchen Health Maintenance    Pt is due for foot exam.      HPI: Tanya Bell is a 77 y.o. female seen in the office today for routine follow up  DM - no low BS reactions on metformin. A1c 6.3%. BS stable at home stable -  She has ariva meter and checks blood sugars at home.  Has routine eye check- following at [facility].   HTN - stable on losartan HCT, amlodipine, coreg. She also takes ASA daily  Hyperlipidemia - stable. no myalgias. She takes crestor 1/2 tab on MWF due to previous rxn of myalgias. She does take daily zetia. LDL 90   Seasonal allergy/asthma - stable with no exacerbations. Stable on nasonex. She uses spiriva, symbicort, proventil hfa  GERD - controlled on zantac. Takes align.    Review of Systems:  Review of Systems  Constitutional: Negative for chills, fever and weight loss.  HENT: Negative for hearing loss and tinnitus.   Respiratory: Negative for cough, sputum production and shortness of breath.   Cardiovascular: Negative for chest pain, palpitations and leg swelling.  Gastrointestinal: Positive for constipation (using linzess occasional) and heartburn (controlled). Negative for abdominal pain and diarrhea.  Genitourinary: Negative for  dysuria, frequency and urgency.  Musculoskeletal: Negative for back pain, falls, joint pain and myalgias.  Skin: Negative.   Neurological: Negative for dizziness and headaches.  Psychiatric/Behavioral: Negative for depression and memory loss. The Tanya Bell does not have insomnia.     Past Medical History:  Diagnosis Date  . Acute upper respiratory infections of unspecified site   . Acute upper respiratory infections of unspecified site   . Acute upper respiratory infections of unspecified site   . Allergic rhinitis due to pollen   . Candidiasis of mouth   . Disorder of bone and cartilage, unspecified   . Diverticulitis of colon (without mention of hemorrhage)(562.11)   . Essential hypertension, benign   . Essential hypertension, benign   . Extrinsic asthma, unspecified   . Herpes simplex with other ophthalmic complications   . Other abnormal glucose   . Other and unspecified hyperlipidemia   . Other and unspecified hyperlipidemia   . Other and unspecified hyperlipidemia   . Other cataract   . Other cataract   . Other dystrophy of vulva   . Other malaise and fatigue   . Rash and other nonspecific skin eruption   . Reflux esophagitis   . Thrombocytopenia, unspecified ([place])   . Type II or unspecified type diabetes mellitus without mention of complication, not stated as uncontrolled   . Type II or unspecified type diabetes mellitus without mention of complication, not stated as uncontrolled   . Unspecified disorder of skin and subcutaneous tissue   . Unspecified essential hypertension   . Unspecified essential  hypertension    Past Surgical History:  Procedure Laterality Date  . ABDOMINAL HYSTERECTOMY    . CESAREAN SECTION     [phone]  . CYST EXCISION  1974   Pilonidal Cyst excision  . EYE SURGERY Right 04/14/2017   Dr. Zadie Rhine  . KNEE ARTHROSCOPY Right    for meniscal tear  . RETINAL TEAR REPAIR CRYOTHERAPY  10/21/2017   Getting injections in right eye   Social  History:   reports that she quit smoking about 27 years ago. Her smoking use included cigarettes. She has never used smokeless tobacco. She reports that she does not drink alcohol or use drugs.  Family History  Problem Relation Age of Onset  . Cancer Mother        brain  . Stroke Father     Medications: Tanya Bell's Medications  New Prescriptions   No medications on file  Previous Medications   ACETAMINOPHEN (TYLENOL) 325 MG TABLET    Take 325 mg by mouth daily as needed. For pain   ALBUTEROL (VENTOLIN HFA) 108 (90 BASE) MCG/ACT INHALER    Inhale 2 puffs into the lungs every 6 (six) hours as needed for wheezing or shortness of breath.   AMBULATORY NON FORMULARY MEDICATION    Compression stockings: to both legs when out of bed, please remove before going to bed at night 1 pair   AMLODIPINE (NORVASC) 5 MG TABLET    Take 1 tablet (5 mg total) by mouth daily.   ASPIRIN 81 MG TABLET    Take 81 mg by mouth daily.   BIFIDOBACTERIUM INFANTIS (ALIGN) CAPSULE    Take one tablet once a day   BRIMONIDINE-TIMOLOL (COMBIGAN) 0.2-0.5 % OPHTHALMIC SOLUTION    Place 1 drop into the right eye 2 (two) times daily.   BUDESONIDE-FORMOTEROL (SYMBICORT) 160-4.5 MCG/ACT INHALER    Use 2 inhalations twice daily   CALCIUM-VITAMIN D (OSCAL WITH D) 500-200 MG-UNIT PER TABLET    Take 1 tablet by mouth 2 (two) times daily.   CARVEDILOL (COREG) 25 MG TABLET    Take one tablet by mouth twice daily for blood pressure   EZETIMIBE (ZETIA) 10 MG TABLET    TAKE 1 TABLET BY MOUTH EVERY DAY   FEXOFENADINE (ALLEGRA) 180 MG TABLET    Take 180 mg by mouth daily.   HYDROCORTISONE (ANUSOL-HC) 2.5 % RECTAL CREAM    Place 1 application rectally 2 (two) times daily as needed for hemorrhoids or itching.   KETOROLAC TROMETHAMINE OP    Apply 1 drop to eye 4 (four) times daily. Right eye   LOSARTAN (COZAAR) 100 MG TABLET    Take 1 tablet (100 mg total) by mouth daily.   METFORMIN (GLUCOPHAGE) 500 MG TABLET    Take 1 tablet (500 mg total)  by mouth daily with breakfast.   MOMETASONE (NASONEX) 50 MCG/ACT NASAL SPRAY    One spray in each nostril twice daily as needed for allergies.   MULTIPLE VITAMIN (MULTIVITAMIN) TABLET    Take 1 tablet by mouth daily.   RANITIDINE (ZANTAC) 150 MG TABLET    Take 150 mg by mouth daily as needed for heartburn.   ROSUVASTATIN (CRESTOR) 20 MG TABLET    Take one tablet three times a week, Monday, Wednesday and Friday   TIOTROPIUM (SPIRIVA) 18 MCG INHALATION CAPSULE    Place 1 capsule (18 mcg total) into inhaler and inhale daily.   VITAMIN C (ASCORBIC ACID) 500 MG TABLET    Take 500 mg by mouth daily.  Modified Medications   No medications on file  Discontinued Medications   CEFDINIR (OMNICEF) 300 MG CAPSULE    Take 1 capsule (300 mg total) by mouth 2 (two) times daily.     Physical Exam:  Vitals:   10/24/18 1002  BP: 126/74  Pulse: 62  Temp: 97.8 F (36.6 C)  TempSrc: Oral  SpO2: 95%  Weight: 152 lb (68.9 kg)  Height: 5\' 1"  (1.549 m)   Body mass index is 28.72 kg/m.  Physical Exam  Constitutional: She is oriented to person, place, and time. She appears well-developed and well-nourished.  HENT:  Mouth/Throat: Oropharynx is clear and moist. No oropharyngeal exudate.  MMM; no oral thrush  Eyes: Pupils are equal, round, and reactive to light. No scleral icterus.  Neck: Neck supple. Carotid bruit is not present. No tracheal deviation present. No thyromegaly present.  Cardiovascular: Normal rate, regular rhythm and intact distal pulses. Exam reveals no gallop and no friction rub.  Murmur (1/6 SEM) heard. No LE edema b/l. no calf TTP.   Pulmonary/Chest: Effort normal and breath sounds normal.  Abdominal: Soft. Normal appearance and bowel sounds are normal. There is no hepatomegaly. There is no rigidity. No hernia.  Musculoskeletal: She exhibits edema.  Lymphadenopathy:    She has no cervical adenopathy.  Neurological: She is alert and oriented to person, place, and time. She has normal  reflexes.  Skin: Skin is warm and dry. No rash noted.  Psychiatric: She has a normal mood and affect. Her behavior is normal. Judgment and thought content normal.    Labs reviewed: Basic Metabolic Panel: Recent Labs    04/19/18 0828 07/24/18 0848 10/20/18 0850  NA 142 141 140  K 4.6 4.0 3.9  CL 104 105 103  CO2 29 29 32  GLUCOSE 112* 125* 114*  BUN 14 14 13   CREATININE 0.76 0.65 0.69  CALCIUM 9.8 9.6 9.6  TSH  --   --  1.88   Liver Function Tests: Recent Labs    04/19/18 0828 07/24/18 0848 10/20/18 0850  AST 11  --  13  ALT 10 11 11   BILITOT 0.6  --  0.5  PROT 7.5  --  7.1   No results for input(s): LIPASE, AMYLASE in the last 8760 hours. No results for input(s): AMMONIA in the last 8760 hours. CBC: Recent Labs    10/20/18 0850  WBC 5.4  NEUTROABS 2,144  HGB 13.1  HCT 39.8  MCV 78.8*  PLT 190   Lipid Panel: Recent Labs    04/19/18 0828 07/24/18 0848 10/20/18 0850  CHOL 206* 159 159  HDL 55 53 55  LDLCALC 135* 92 90  TRIG 65 56 54  CHOLHDL 3.7 3.0 2.9   TSH: Recent Labs    10/20/18 0850  TSH 1.88   A1C: Lab Results  Component Value Date   HGBA1C 6.3 (H) 10/20/2018     Assessment/Plan 1. Extrinsic asthma, unspecified asthma severity, uncomplicated Stable, without recent exacerbations or needing albuterol - budesonide-formoterol (SYMBICORT) 160-4.5 MCG/ACT inhaler; Use 2 inhalations twice daily  Dispense: 30.6 g; Refill: 1  2. Hemorrhoids, unspecified hemorrhoid type Stable, occasionally will need Anusol.  - hydrocortisone (ANUSOL-HC) 2.5 % rectal cream; Place 1 application rectally 2 (two) times daily as needed.  Dispense: 30 g; Refill: 0  3. Controlled type 2 diabetes mellitus without complication, without long-term current use of insulin (HCC) Stable, continues on metformin 500 mg daily  - Hemoglobin A1c; Future - COMPLETE METABOLIC PANEL WITH GFR; Future -  CBC with Differential/Platelets; Future  4. Hyperlipidemia associated with  type 2 diabetes mellitus (HCC) -LDL 90 which is improved from 135, continues on crestor and zetia with dietary modifications.  - Lipid Panel; Future  5. Seasonal allergic rhinitis due to pollen Stable, continues on allegra and nasonex   6. Essential hypertension Stable, continue on norvasc, coreg and cozaar  7. Macular degeneration of right eye, unspecified type Ongoing follow up by ophthalmologist.    Next appt: 12/20/2018 Carlos American. [place], [facility] [phone]

## 2018-11-02 DIAGNOSIS — H35372 Puckering of macula, left eye: Secondary | ICD-10-CM | POA: Diagnosis not present

## 2018-11-02 DIAGNOSIS — H35351 Cystoid macular degeneration, right eye: Secondary | ICD-10-CM | POA: Diagnosis not present

## 2018-11-02 DIAGNOSIS — Z8669 Personal history of other diseases of the nervous system and sense organs: Secondary | ICD-10-CM | POA: Diagnosis not present

## 2018-11-02 DIAGNOSIS — E119 Type 2 diabetes mellitus without complications: Secondary | ICD-10-CM | POA: Diagnosis not present

## 2018-11-02 DIAGNOSIS — H3521 Other non-diabetic proliferative retinopathy, right eye: Secondary | ICD-10-CM | POA: Diagnosis not present

## 2018-11-02 DIAGNOSIS — D573 Sickle-cell trait: Secondary | ICD-10-CM | POA: Diagnosis not present

## 2018-11-02 DIAGNOSIS — H35371 Puckering of macula, right eye: Secondary | ICD-10-CM | POA: Diagnosis not present

## 2018-11-16 DIAGNOSIS — R109 Unspecified abdominal pain: Secondary | ICD-10-CM | POA: Diagnosis not present

## 2018-11-22 ENCOUNTER — Other Ambulatory Visit: Payer: Self-pay | Admitting: Gastroenterology

## 2018-11-22 ENCOUNTER — Other Ambulatory Visit: Payer: Self-pay | Admitting: Internal Medicine

## 2018-11-22 DIAGNOSIS — R1084 Generalized abdominal pain: Secondary | ICD-10-CM

## 2018-11-22 DIAGNOSIS — K581 Irritable bowel syndrome with constipation: Secondary | ICD-10-CM | POA: Diagnosis not present

## 2018-11-22 DIAGNOSIS — Z8719 Personal history of other diseases of the digestive system: Secondary | ICD-10-CM

## 2018-11-27 ENCOUNTER — Ambulatory Visit
Admission: RE | Admit: 2018-11-27 | Discharge: 2018-11-27 | Disposition: A | Discharge status: Home / Self Care | Payer: Medicare Other | Source: Ambulatory Visit | Attending: Gastroenterology | Admitting: Gastroenterology

## 2018-11-27 DIAGNOSIS — Z8719 Personal history of other diseases of the digestive system: Secondary | ICD-10-CM

## 2018-11-27 DIAGNOSIS — R1084 Generalized abdominal pain: Secondary | ICD-10-CM

## 2018-11-27 DIAGNOSIS — K5730 Diverticulosis of large intestine without perforation or abscess without bleeding: Secondary | ICD-10-CM | POA: Diagnosis not present

## 2018-11-27 MED ORDER — IOPAMIDOL (ISOVUE-300) INJECTION 61%
100.0000 mL | Freq: Once | INTRAVENOUS | Status: AC | PRN
  Administered 2018-11-27: 100 mL via INTRAVENOUS

## 2018-11-28 ENCOUNTER — Telehealth: Payer: Self-pay | Admitting: *Deleted

## 2018-11-28 NOTE — Telephone Encounter (Signed)
Received fax from [place] stating that insurance will not cover Mometasone Furoate NS.  Covered Alternative is: Fluticasone PR NS Sp 16gm.   Please Advise with directions.

## 2018-11-30 MED ORDER — FLUTICASONE PROPIONATE 50 MCG/ACT NA SUSP: 2.0000 | Freq: Every day | NASAL | 6 refills | Status: DC

## 2018-11-30 NOTE — Telephone Encounter (Signed)
Order placed in chart and sent to pharmacy

## 2018-12-18 ENCOUNTER — Other Ambulatory Visit: Payer: Self-pay | Admitting: *Deleted

## 2018-12-18 DIAGNOSIS — J45909 Unspecified asthma, uncomplicated: Secondary | ICD-10-CM

## 2018-12-18 MED ORDER — BUDESONIDE-FORMOTEROL FUMARATE 160-4.5 MCG/ACT IN AERO: INHALATION_SPRAY | RESPIRATORY_TRACT | 1 refills | Status: DC

## 2018-12-18 MED ORDER — BUDESONIDE-FORMOTEROL FUMARATE 160-4.5 MCG/ACT IN AERO: INHALATION_SPRAY | RESPIRATORY_TRACT | 0 refills | Status: DC

## 2018-12-18 NOTE — Telephone Encounter (Signed)
Patient requested a Rx to be [facility]. Stated she also needed a Rx sent to local pharmacy while she waited for mail order. Faxed.

## 2018-12-20 ENCOUNTER — Ambulatory Visit: Payer: Self-pay

## 2018-12-22 DIAGNOSIS — K5790 Diverticulosis of intestine, part unspecified, without perforation or abscess without bleeding: Secondary | ICD-10-CM | POA: Diagnosis not present

## 2018-12-26 ENCOUNTER — Encounter: Payer: Medicare Other | Admitting: Family

## 2018-12-27 ENCOUNTER — Other Ambulatory Visit: Payer: Self-pay | Admitting: *Deleted

## 2018-12-27 DIAGNOSIS — I10 Essential (primary) hypertension: Secondary | ICD-10-CM

## 2018-12-27 MED ORDER — EZETIMIBE 10 MG PO TABS: 10.0000 mg | ORAL_TABLET | Freq: Every day | ORAL | 1 refills | Status: DC

## 2018-12-27 MED ORDER — METFORMIN HCL 500 MG PO TABS: 500.0000 mg | ORAL_TABLET | Freq: Every day | ORAL | 1 refills | Status: DC

## 2018-12-27 MED ORDER — AMLODIPINE BESYLATE 5 MG PO TABS: 5.0000 mg | ORAL_TABLET | Freq: Every day | ORAL | 1 refills | Status: DC

## 2018-12-27 NOTE — Telephone Encounter (Signed)
Patient requested Refills. [facility]

## 2019-01-02 ENCOUNTER — Ambulatory Visit (INDEPENDENT_AMBULATORY_CARE_PROVIDER_SITE_OTHER): Payer: Medicare Other | Admitting: Family

## 2019-01-02 ENCOUNTER — Encounter: Payer: Self-pay | Admitting: Family

## 2019-01-02 VITALS — BP 130/80 | HR 61 | Temp 98.3°F | Resp 18 | Ht 61.0 in | Wt 156.6 lb

## 2019-01-02 DIAGNOSIS — Z23 Encounter for immunization: Secondary | ICD-10-CM

## 2019-01-02 DIAGNOSIS — Z0000 Encounter for general adult medical examination without abnormal findings: Secondary | ICD-10-CM | POA: Diagnosis not present

## 2019-01-02 MED ORDER — ZOSTER VAC RECOMB ADJUVANTED 50 MCG/0.5ML IM SUSR: 0.5000 mL | Freq: Once | INTRAMUSCULAR | 0 refills | Status: AC

## 2019-01-02 NOTE — Patient Instructions (Signed)
Tanya Bell , Thank you for taking time to come for your Medicare Wellness Visit. I appreciate your ongoing commitment to your health goals. Please review the following plan we discussed and let me know if I can assist you in the future.   Screening recommendations/referrals: Colonoscopy up date  Mammogram: Annually patient will call for appointment  Bone Density; up to date  Recommended yearly ophthalmology/optometry visit for glaucoma screening and checkup Recommended yearly dental visit for hygiene and checkup  Vaccinations: Influenza vaccine: Up date  Pneumococcal vaccine: Up date  Tdap vaccine; Due 02/16/2022 Shingles vaccine: Patient to Notify provider when ready to get shot     Advanced directives:Declined   Conditions/risks identified:hypertension,Hyperlipidemia,Type 2DM  Dietary and life style changes discussed.   Next appointment: 1 year   Preventive Care 20 Years and Older, Female Preventive care refers to lifestyle choices and visits with your health care provider that can promote health and wellness. What does preventive care include?  A yearly physical exam. This is also called an annual well check.  Dental exams once or twice a year.  Routine eye exams. Ask your health care provider how often you should have your eyes checked.  Personal lifestyle choices, including:  Daily care of your teeth and gums.  Regular physical activity.  Eating a healthy diet.  Avoiding tobacco and drug use.  Limiting alcohol use.  Practicing safe sex.  Taking low-dose aspirin every day.  Taking vitamin and mineral supplements as recommended by your health care provider. What happens during an annual well check? The services and screenings done by your health care provider during your annual well check will depend on your age, overall health, lifestyle risk factors, and family history of disease. Counseling  Your health care provider may ask you questions about  your:  Alcohol use.  Tobacco use.  Drug use.  Emotional well-being.  Home and relationship well-being.  Sexual activity.  Eating habits.  History of falls.  Memory and ability to understand (cognition).  Work and work Statistician.  Reproductive health. Screening  You may have the following tests or measurements:  Height, weight, and BMI.  Blood pressure.  Lipid and cholesterol levels. These may be checked every 5 years, or more frequently if you are over 72 years old.  Skin check.  Lung cancer screening. You may have this screening every year starting at age 72 if you have a 30-pack-year history of smoking and currently smoke or have quit within the past 15 years.  Fecal occult blood test (FOBT) of the stool. You may have this test every year starting at age 23.  Flexible sigmoidoscopy or colonoscopy. You may have a sigmoidoscopy every 5 years or a colonoscopy every 10 years starting at age 43.  Hepatitis C blood test.  Hepatitis B blood test.  Sexually transmitted disease (STD) testing.  Diabetes screening. This is done by checking your blood sugar (glucose) after you have not eaten for a while (fasting). You may have this done every 1-3 years.  Bone density scan. This is done to screen for osteoporosis. You may have this done starting at age 25.  Mammogram. This may be done every 1-2 years. Talk to your health care provider about how often you should have regular mammograms. Talk with your health care provider about your test results, treatment options, and if necessary, the need for more tests. Vaccines  Your health care provider may recommend certain vaccines, such as:  Influenza vaccine. This is recommended every year.  Tetanus, diphtheria, and acellular pertussis (Tdap, Td) vaccine. You may need a Td booster every 10 years.  Zoster vaccine. You may need this after age 82.  Pneumococcal 13-valent conjugate (PCV13) vaccine. One dose is recommended  after age 74.  Pneumococcal polysaccharide (PPSV23) vaccine. One dose is recommended after age 59. Talk to your health care provider about which screenings and vaccines you need and how often you need them. This information is not intended to replace advice given to you by your health care provider. Make sure you discuss any questions you have with your health care provider. Document Released: 12/26/2015 Document Revised: 08/18/2016 Document Reviewed: 09/30/2015 Elsevier Interactive Patient Education  2017 [place] Prevention in the Home Falls can cause injuries. They can happen to people of all ages. There are many things you can do to make your home safe and to help prevent falls. What can I do on the outside of my home?  Regularly fix the edges of walkways and driveways and fix any cracks.  Remove anything that might make you trip as you walk through a door, such as a raised step or threshold.  Trim any bushes or trees on the path to your home.  Use bright outdoor lighting.  Clear any walking paths of anything that might make someone trip, such as rocks or tools.  Regularly check to see if handrails are loose or broken. Make sure that both sides of any steps have handrails.  Any raised decks and porches should have guardrails on the edges.  Have any leaves, snow, or ice cleared regularly.  Use sand or salt on walking paths during winter.  Clean up any spills in your garage right away. This includes oil or grease spills. What can I do in the bathroom?  Use night lights.  Install grab bars by the toilet and in the tub and shower. Do not use towel bars as grab bars.  Use non-skid mats or decals in the tub or shower.  If you need to sit down in the shower, use a plastic, non-slip stool.  Keep the floor dry. Clean up any water that spills on the floor as soon as it happens.  Remove soap buildup in the tub or shower regularly.  Attach bath mats securely with  double-sided non-slip rug tape.  Do not have throw rugs and other things on the floor that can make you trip. What can I do in the bedroom?  Use night lights.  Make sure that you have a light by your bed that is easy to reach.  Do not use any sheets or blankets that are too big for your bed. They should not hang down onto the floor.  Have a firm chair that has side arms. You can use this for support while you get dressed.  Do not have throw rugs and other things on the floor that can make you trip. What can I do in the kitchen?  Clean up any spills right away.  Avoid walking on wet floors.  Keep items that you use a lot in easy-to-reach places.  If you need to reach something above you, use a strong step stool that has a grab bar.  Keep electrical cords out of the way.  Do not use floor polish or wax that makes floors slippery. If you must use wax, use non-skid floor wax.  Do not have throw rugs and other things on the floor that can make you trip. What can I do  with my stairs?  Do not leave any items on the stairs.  Make sure that there are handrails on both sides of the stairs and use them. Fix handrails that are broken or loose. Make sure that handrails are as long as the stairways.  Check any carpeting to make sure that it is firmly attached to the stairs. Fix any carpet that is loose or worn.  Avoid having throw rugs at the top or bottom of the stairs. If you do have throw rugs, attach them to the floor with carpet tape.  Make sure that you have a light switch at the top of the stairs and the bottom of the stairs. If you do not have them, ask someone to add them for you. What else can I do to help prevent falls?  Wear shoes that:  Do not have high heels.  Have rubber bottoms.  Are comfortable and fit you well.  Are closed at the toe. Do not wear sandals.  If you use a stepladder:  Make sure that it is fully opened. Do not climb a closed stepladder.  Make  sure that both sides of the stepladder are locked into place.  Ask someone to hold it for you, if possible.  Clearly mark and make sure that you can see:  Any grab bars or handrails.  First and last steps.  Where the edge of each step is.  Use tools that help you move around (mobility aids) if they are needed. These include:  Canes.  Walkers.  Scooters.  Crutches.  Turn on the lights when you go into a dark area. Replace any light bulbs as soon as they burn out.  Set up your furniture so you have a clear path. Avoid moving your furniture around.  If any of your floors are uneven, fix them.  If there are any pets around you, be aware of where they are.  Review your medicines with your doctor. Some medicines can make you feel dizzy. This can increase your chance of falling. Ask your doctor what other things that you can do to help prevent falls. This information is not intended to replace advice given to you by your health care provider. Make sure you discuss any questions you have with your health care provider. Document Released: 09/25/2009 Document Revised: 05/06/2016 Document Reviewed: 01/03/2015 Elsevier Interactive Patient Education  2017 [employer].

## 2019-01-02 NOTE — Progress Notes (Signed)
Subjective:   Tanya Bell is a 78 y.o. female who presents for Medicare Annual (Subsequent) preventive examination.  Review of Systems:   Objective:     Vitals: BP 130/80   Pulse 61   Temp 98.3 F (36.8 C) (Oral)   Ht 5\' 1"  (1.549 m)   Wt 156 lb 9.6 oz (71 kg)   SpO2 95%   BMI 29.59 kg/m   Body mass index is 29.59 kg/m.  Advanced Directives 01/02/2019 01/02/2019 10/24/2018 04/21/2018 12/14/2017 09/06/2017 06/08/2017  Does Patient Have a Medical Advance Directive? No No No No No No No  Does patient want to make changes to medical advance directive? - - - Yes (MAU/Ambulatory/Procedural Areas - Information given) - - -  Would patient like information on creating a medical advance directive? No - Patient declined No - Patient declined - - Yes (MAU/Ambulatory/Procedural Areas - Information given) No - Patient declined No - Patient declined    Tobacco Social History   Tobacco Use  Smoking Status Former Smoker  . Types: Cigarettes  . Last attempt to quit: 12/13/1990  . Years since quitting: 28.0  Smokeless Tobacco Never Used     Counseling given: Not Answered   Clinical Intake:   Past Medical History:  Diagnosis Date  . Acute upper respiratory infections of unspecified site   . Acute upper respiratory infections of unspecified site   . Acute upper respiratory infections of unspecified site   . Allergic rhinitis due to pollen   . Candidiasis of mouth   . Disorder of bone and cartilage, unspecified   . Diverticulitis of colon (without mention of hemorrhage)(562.11)   . Essential hypertension, benign   . Essential hypertension, benign   . Extrinsic asthma, unspecified   . Herpes simplex with other ophthalmic complications   . Other abnormal glucose   . Other and unspecified hyperlipidemia   . Other and unspecified hyperlipidemia   . Other and unspecified hyperlipidemia   . Other cataract   . Other cataract   . Other dystrophy of vulva   . Other malaise and  fatigue   . Rash and other nonspecific skin eruption   . Reflux esophagitis   . Thrombocytopenia, unspecified ([place])   . Type II or unspecified type diabetes mellitus without mention of complication, not stated as uncontrolled   . Type II or unspecified type diabetes mellitus without mention of complication, not stated as uncontrolled   . Unspecified disorder of skin and subcutaneous tissue   . Unspecified essential hypertension   . Unspecified essential hypertension    Past Surgical History:  Procedure Laterality Date  . ABDOMINAL HYSTERECTOMY    . CESAREAN SECTION     [phone]  . CYST EXCISION  1974   Pilonidal Cyst excision  . EYE SURGERY Right 04/14/2017   Dr. Zadie Rhine  . KNEE ARTHROSCOPY Right    for meniscal tear  . RETINAL TEAR REPAIR CRYOTHERAPY  10/21/2017   Getting injections in right eye   Family History  Problem Relation Age of Onset  . Cancer Mother        brain  . Stroke Father    Social History   Socioeconomic History  . Marital status: Married    Spouse name: Not on file  . Number of children: Not on file  . Years of education: Not on file  . Highest education level: Not on file  Occupational History  . Not on file  Social Needs  . Financial resource strain: Not  hard at all  . Food insecurity:    Worry: Never true    Inability: Never true  . Transportation needs:    Medical: Yes    Non-medical: Yes  Tobacco Use  . Smoking status: Former Smoker    Types: Cigarettes    Last attempt to quit: 12/13/1990    Years since quitting: 28.0  . Smokeless tobacco: Never Used  Substance and Sexual Activity  . Alcohol use: No    Alcohol/week: 0.0 standard drinks  . Drug use: No  . Sexual activity: Yes    Partners: Male    Comment: None   Lifestyle  . Physical activity:    Days per week: 0 days    Minutes per session: 0 min  . Stress: Very much  Relationships  . Social connections:    Talks on phone: More than three times a week    Gets  together: Once a week    Attends religious service: More than 4 times per year    Active member of club or organization: Yes    Attends meetings of clubs or organizations: Never    Relationship status: Married  Other Topics Concern  . Not on file  Social History Narrative  . Not on file    Outpatient Encounter Medications as of 01/02/2019  Medication Sig  . acetaminophen (TYLENOL) 325 MG tablet Take 325 mg by mouth daily as needed. For pain  . albuterol (VENTOLIN HFA) 108 (90 Base) MCG/ACT inhaler Inhale 2 puffs into the lungs every 6 (six) hours as needed for wheezing or shortness of breath.  . AMBULATORY NON FORMULARY MEDICATION Compression stockings: to both legs when out of bed, please remove before going to bed at night 1 pair  . amLODipine (NORVASC) 5 MG tablet Take 1 tablet (5 mg total) by mouth daily.  Marland Kitchen aspirin 81 MG tablet Take 81 mg by mouth daily.  . brimonidine-timolol (COMBIGAN) 0.2-0.5 % ophthalmic solution Place 1 drop into the right eye 2 (two) times daily.  . budesonide-formoterol (SYMBICORT) 160-4.5 MCG/ACT inhaler Use 2 inhalations twice daily  . calcium-vitamin D (OSCAL WITH D) 500-200 MG-UNIT per tablet Take 1 tablet by mouth 2 (two) times daily.  . carvedilol (COREG) 25 MG tablet Take one tablet by mouth twice daily for blood pressure  . ezetimibe (ZETIA) 10 MG tablet Take 1 tablet (10 mg total) by mouth daily.  . fexofenadine (ALLEGRA) 180 MG tablet Take 180 mg by mouth as needed.   . fluticasone (FLONASE) 50 MCG/ACT nasal spray Place 2 sprays into both nostrils daily.  . hydrocortisone (ANUSOL-HC) 2.5 % rectal cream Place 1 application rectally 2 (two) times daily as needed.  Marland Kitchen KETOROLAC TROMETHAMINE OP Apply 1 drop to eye 4 (four) times daily. Right eye  . losartan (COZAAR) 100 MG tablet Take 1 tablet (100 mg total) by mouth daily.  . metFORMIN (GLUCOPHAGE) 500 MG tablet Take 1 tablet (500 mg total) by mouth daily with breakfast.  . Multiple Vitamin  (MULTIVITAMIN) tablet Take 1 tablet by mouth daily.  . rosuvastatin (CRESTOR) 20 MG tablet Take one tablet three times a week, Monday, Wednesday and Friday  . tiotropium (SPIRIVA) 18 MCG inhalation capsule Place 1 capsule (18 mcg total) into inhaler and inhale daily.  . vitamin C (ASCORBIC ACID) 500 MG tablet Take 500 mg by mouth daily.  Marland Kitchen Zoster Vaccine Adjuvanted [facility]) injection Inject 0.5 mLs into the muscle once.  . [DISCONTINUED] bifidobacterium infantis (ALIGN) capsule Take one tablet once a day  . [  DISCONTINUED] ranitidine (ZANTAC) 150 MG tablet Take 150 mg by mouth daily as needed for heartburn.   No facility-administered encounter medications on file as of 01/02/2019.     Activities of Daily Living No flowsheet data found.  Patient Care Team: Druscilla Petsch, Nelda Bucks, NP as PCP - General (Family Medicine) Teena Irani, MD (Inactive) as Consulting Physician (Gastroenterology) Warden Fillers, MD as Consulting Physician (Ophthalmology) [name], MD as Consulting Physician (Ophthalmology) Mickel Fuchs, MD as Referring Physician (Psychiatry)    Assessment:   This is a routine wellness examination for Tanya Bell.  Exercise Activities and Dietary recommendations    Goals    . Weight (lb) < 200 lb (90.7 kg)     Starting 10/29/16, I will attempt to maintain my current weight or decrease when possible.        Fall Risk Fall Risk  01/02/2019 10/24/2018 07/26/2018 04/21/2018 12/16/2017  Falls in the past year? 0 0 No No No  Number falls in past yr: 0 - - - -  Injury with Fall? 0 - - - -   Is the patient's home free of loose throw rugs in walkways, pet beds, electrical cords, etc?   no      Grab bars in the bathroom? yes      Handrails on the stairs?   no      Adequate lighting?   yes   Depression Screen PHQ 2/9 Scores 01/02/2019 12/16/2017 12/14/2017 10/29/2016  PHQ - 2 Score 0 0 1 0     Cognitive Function MMSE - Mini Mental State Exam 01/02/2019 12/14/2017 10/29/2016 09/04/2014   Orientation to time 5 5 5 5   Orientation to Place 5 5 5 5   Registration 3 3 3 3   Attention/ Calculation 5 5 5 3   Recall 3 1 2 1   Language- name 2 objects 2 2 2 2   Language- repeat 1 1 1 1   Language- follow 3 step command 3 3 2 2   Language- read & follow direction 1 1 1 1   Write a sentence 1 1 1 1   Copy design 1 1 1 1   Total score 30 28 28 25         Immunization History  Administered Date(s) Administered  . Influenza Split 08/25/2010, 08/26/2011, 10/04/2012  . Influenza Whole 09/24/2009  . Influenza, High Dose Seasonal PF 08/16/2018  . Influenza,inj,Quad PF,6+ Mos 09/11/2013, 09/04/2014, 11/12/2015, 10/04/2016, 09/06/2017  . Influenza-Unspecified 09/02/2018  . Pneumococcal Conjugate-13 02/17/2012  . Pneumococcal Polysaccharide-23 05/19/2016  . Tdap 02/17/2012    Qualifies for Shingles Vaccine? Decline   Screening Tests Health Maintenance  Topic Date Due  . OPHTHALMOLOGY EXAM  04/07/2019  . HEMOGLOBIN A1C  04/20/2019  . FOOT EXAM  10/25/2019  . TETANUS/TDAP  02/16/2022  . INFLUENZA VACCINE  Completed  . DEXA SCAN  Completed  . PNA vac Low Risk Adult  Completed    Cancer Screenings: Lung: Low Dose CT Chest recommended if Age 93-80 years, 30 pack-year currently smoking OR have quit w/in 15years. Patient does not qualify. Breast:  Up to date on Mammogram? No   Up to date of Bone Density/Dexa? Yes Colorectal: up to date  Additional Screenings: Hepatitis C Screening: Declined   Plan:  -  Patient will provider's office when ready for to Schedule for Mammogram   I have personally reviewed and noted the following in the patient's chart:   . Medical and social history . Use of alcohol, tobacco or illicit drugs  . Current medications and supplements .  Functional ability and status . Nutritional status . Physical activity . Advanced directives . List of other physicians . Hospitalizations, surgeries, and ER visits in previous 12 months . Vitals . Screenings to  include cognitive, depression, and falls . Referrals and appointments  In addition, I have reviewed and discussed with patient certain preventive protocols, quality metrics, and best practice recommendations. A written personalized care plan for preventive services as well as general preventive health recommendations were provided to patient.     Sandrea Hughs, NP  01/02/2019

## 2019-01-04 ENCOUNTER — Telehealth: Payer: Self-pay

## 2019-01-04 NOTE — Telephone Encounter (Signed)
Okay to take collagen supplement.

## 2019-01-04 NOTE — Telephone Encounter (Signed)
Left message on voicemail informing patient of Dinah's response. Patient instructed to return call if additional questions or concerns

## 2019-01-04 NOTE — Telephone Encounter (Signed)
Patient called to question if she can take a collagen supplement with her current medications.   Please advise

## 2019-01-09 ENCOUNTER — Other Ambulatory Visit: Payer: Self-pay | Admitting: Nurse Practitioner

## 2019-01-09 DIAGNOSIS — J45909 Unspecified asthma, uncomplicated: Secondary | ICD-10-CM

## 2019-02-20 ENCOUNTER — Other Ambulatory Visit: Payer: Self-pay | Admitting: *Deleted

## 2019-02-20 MED ORDER — EZETIMIBE 10 MG PO TABS: 10.0000 mg | ORAL_TABLET | Freq: Every day | ORAL | 0 refills | Status: DC

## 2019-02-20 MED ORDER — EZETIMIBE 10 MG PO TABS: 10.0000 mg | ORAL_TABLET | Freq: Every day | ORAL | 1 refills | Status: DC

## 2019-02-20 NOTE — Telephone Encounter (Signed)
Patient requested Refill

## 2019-02-20 NOTE — Telephone Encounter (Signed)
Patient called back and stated that she wants her Rx sent to [facility] instead. Faxed.

## 2019-04-08 ENCOUNTER — Encounter: Payer: Self-pay | Admitting: Family

## 2019-04-19 ENCOUNTER — Other Ambulatory Visit: Payer: Medicare Other

## 2019-04-19 ENCOUNTER — Other Ambulatory Visit: Payer: Self-pay

## 2019-04-19 DIAGNOSIS — E1169 Type 2 diabetes mellitus with other specified complication: Secondary | ICD-10-CM

## 2019-04-19 DIAGNOSIS — E119 Type 2 diabetes mellitus without complications: Secondary | ICD-10-CM

## 2019-04-19 DIAGNOSIS — E785 Hyperlipidemia, unspecified: Secondary | ICD-10-CM | POA: Diagnosis not present

## 2019-04-20 LAB — COMPLETE METABOLIC PANEL WITH GFR
AG Ratio: 1.3 (calc) (ref 1.0–2.5)
ALT: 14 U/L (ref 6–29)
AST: 13 U/L (ref 10–35)
Albumin: 4 g/dL (ref 3.6–5.1)
Alkaline phosphatase (APISO): 60 U/L (ref 37–153)
BUN: 15 mg/dL (ref 7–25)
CO2: 30 mmol/L (ref 20–32)
Calcium: 9.4 mg/dL (ref 8.6–10.4)
Chloride: 106 mmol/L (ref 98–110)
Creat: 0.69 mg/dL (ref 0.60–0.93)
GFR, Est African American: 97 mL/min/{1.73_m2} (ref >=60)
GFR, Est Non African American: 84 mL/min/{1.73_m2} (ref >=60)
Globulin: 3.1 g/dL (calc) (ref 1.9–3.7)
Glucose, Bld: 115 mg/dL — ABNORMAL HIGH (ref 65–99)
Potassium: 4.5 mmol/L (ref 3.5–5.3)
Sodium: 141 mmol/L (ref 135–146)
Total Bilirubin: 0.4 mg/dL (ref 0.2–1.2)
Total Protein: 7.1 g/dL (ref 6.1–8.1)

## 2019-04-20 LAB — CBC WITH DIFFERENTIAL/PLATELET
Absolute Monocytes: 589 cells/uL (ref 200–950)
Basophils Absolute: 38 cells/uL (ref 0–200)
Basophils Relative: 0.6 %
Eosinophils Absolute: 275 cells/uL (ref 15–500)
Eosinophils Relative: 4.3 %
HCT: 40.9 % (ref 35.0–45.0)
Hemoglobin: 13.5 g/dL (ref 11.7–15.5)
Lymphs Abs: 3123 cells/uL (ref 850–3900)
MCH: 26.1 pg — ABNORMAL LOW (ref 27.0–33.0)
MCHC: 33 g/dL (ref 32.0–36.0)
MCV: 79.1 fL — ABNORMAL LOW (ref 80.0–100.0)
MPV: 11.5 fL (ref 7.5–12.5)
Monocytes Relative: 9.2 %
Neutro Abs: 2374 cells/uL (ref 1500–7800)
Neutrophils Relative %: 37.1 %
Platelets: 179 10*3/uL (ref 140–400)
RBC: 5.17 10*6/uL — ABNORMAL HIGH (ref 3.80–5.10)
RDW: 15.8 % — ABNORMAL HIGH (ref 11.0–15.0)
Total Lymphocyte: 48.8 %
WBC: 6.4 10*3/uL (ref 3.8–10.8)

## 2019-04-20 LAB — LIPID PANEL
Cholesterol: 167 mg/dL (ref <200)
HDL: 58 mg/dL (ref >=50)
LDL Cholesterol (Calc): 94 mg/dL (calc)
Non-HDL Cholesterol (Calc): 109 mg/dL (calc) (ref <130)
Total CHOL/HDL Ratio: 2.9 (calc) (ref <5.0)
Triglycerides: 63 mg/dL (ref <150)

## 2019-04-20 LAB — HEMOGLOBIN A1C
Hgb A1c MFr Bld: 6.4 % of total Hgb — ABNORMAL HIGH (ref <5.7)
Mean Plasma Glucose: 137 (calc)
eAG (mmol/L): 7.6 (calc)

## 2019-04-23 ENCOUNTER — Ambulatory Visit (INDEPENDENT_AMBULATORY_CARE_PROVIDER_SITE_OTHER): Payer: Medicare Other | Admitting: Nurse Practitioner

## 2019-04-23 ENCOUNTER — Other Ambulatory Visit: Payer: Self-pay

## 2019-04-23 ENCOUNTER — Encounter: Payer: Self-pay | Admitting: Nurse Practitioner

## 2019-04-23 DIAGNOSIS — I10 Essential (primary) hypertension: Secondary | ICD-10-CM | POA: Diagnosis not present

## 2019-04-23 DIAGNOSIS — J301 Allergic rhinitis due to pollen: Secondary | ICD-10-CM

## 2019-04-23 DIAGNOSIS — E785 Hyperlipidemia, unspecified: Secondary | ICD-10-CM

## 2019-04-23 DIAGNOSIS — E2839 Other primary ovarian failure: Secondary | ICD-10-CM

## 2019-04-23 DIAGNOSIS — E119 Type 2 diabetes mellitus without complications: Secondary | ICD-10-CM | POA: Diagnosis not present

## 2019-04-23 DIAGNOSIS — J45909 Unspecified asthma, uncomplicated: Secondary | ICD-10-CM

## 2019-04-23 DIAGNOSIS — M8580 Other specified disorders of bone density and structure, unspecified site: Secondary | ICD-10-CM | POA: Diagnosis not present

## 2019-04-23 NOTE — Progress Notes (Signed)
This service is provided via telemedicine  No vital signs collected/recorded due to the encounter was a telemedicine visit.   Location of patient (ex: home, work):  Home  Patient consents to a telephone visit: Yes  Location of the provider (ex: office, home):  [employer], Office   Name of any referring provider:  Ngetich, Nelda Bucks, NP  Names of all persons participating in the telemedicine service and their role in the encounter: S.Chrae B/CMA, Sherrie Mustache, NP, and Patient   Time spent on call:  14 min medical assistant      Careteam: Patient Care Team: Ngetich, Nelda Bucks, NP as PCP - General (Family Medicine) Teena Irani, MD (Inactive) as Consulting Physician (Gastroenterology) Warden Fillers, MD as Consulting Physician (Ophthalmology) [name], MD as Consulting Physician (Ophthalmology) Mickel Fuchs, MD as Referring Physician (Psychiatry)  Advanced Directive information Does Patient Have a Medical Advance Directive?: No, Would patient like information on creating a medical advance directive?: Yes (MAU/Ambulatory/Procedural Areas - Information given)(Paperwork given at a previous appointment )  Allergies  Allergen Reactions  . Crestor [Rosuvastatin Calcium]     Muscle cramps  . Latex Other (See Comments)    GLOVES  . Sulfa Antibiotics     Chief Complaint  Patient presents with  . Medical Management of Chronic Issues    6 month follow-up and discuss labs. Telephone visit  . Medication Management    Discuss Anusol and Sprivia (needs alternative, not cover by insurance)  . Immunizations    Patient would like to hold offon Shingrix. Patient will advise when she is ready for rx to be sent to pharmacy.      HPI: Patient is a 78 y.o. female for routine follow up.   Hemorrhoids- Anusol not covered by insurance, not having issue now.   Constipation- occasional- uses metamucil and mag for control of this.   DM - no low BS reactions on  metformin. A1c 6.4%. BS stable at homestable - Has routine eye check- following at [facility]. using metformin 500 mg by mouth daily. Snacking more.  HTN - stable on losartan HCT, amlodipine, coreg. She also takes ASA daily. Does not check at home but will check [facility] if she is there. Does not remember numbers but "not high"  Hyperlipidemia - stable. no myalgias. She takescrestor 1/2 tab on MWF due to previous rxn of myalgias. She does take daily zetia. LDL 94  Seasonal allergy/asthma - stable with no exacerbations. Stable on nasonex. She uses spiriva, symbicort, proventil hfa (insurance not covering Spiriva) has done without medication for about 6 months to a year. Using albuterol 1-2 times a week.  Continues to use allergia for allergies, this is the worse problem  GERD - previously on zantac. Takes align. uses gas x for gas but no worsening GERD  Osteopenia-continues on calcium and vit d   Review of Systems:  Review of Systems  Constitutional: Negative for chills, fever and weight loss.  HENT: Negative for hearing loss and tinnitus.   Respiratory: Negative for cough, sputum production and shortness of breath.   Cardiovascular: Negative for chest pain, palpitations and leg swelling.  Gastrointestinal: Negative for abdominal pain, constipation, diarrhea and heartburn.  Genitourinary: Negative for dysuria, frequency and urgency.  Musculoskeletal: Negative for back pain, falls, joint pain and myalgias.  Skin: Negative.   Neurological: Negative for dizziness, tingling and headaches.  Endo/Heme/Allergies: Positive for environmental allergies.  Psychiatric/Behavioral: Negative for depression and memory loss. The patient does not have  insomnia.     Past Medical History:  Diagnosis Date  . Acute upper respiratory infections of unspecified site   . Acute upper respiratory infections of unspecified site   . Acute upper respiratory infections of unspecified site   . Allergic rhinitis due  to pollen   . Candidiasis of mouth   . Disorder of bone and cartilage, unspecified   . Diverticulitis of colon (without mention of hemorrhage)(562.11)   . Essential hypertension, benign   . Essential hypertension, benign   . Extrinsic asthma, unspecified   . Herpes simplex with other ophthalmic complications   . Other abnormal glucose   . Other and unspecified hyperlipidemia   . Other and unspecified hyperlipidemia   . Other and unspecified hyperlipidemia   . Other cataract   . Other cataract   . Other dystrophy of vulva   . Other malaise and fatigue   . Rash and other nonspecific skin eruption   . Reflux esophagitis   . Thrombocytopenia, unspecified ([place])   . Type II or unspecified type diabetes mellitus without mention of complication, not stated as uncontrolled   . Type II or unspecified type diabetes mellitus without mention of complication, not stated as uncontrolled   . Unspecified disorder of skin and subcutaneous tissue   . Unspecified essential hypertension   . Unspecified essential hypertension    Past Surgical History:  Procedure Laterality Date  . ABDOMINAL HYSTERECTOMY    . CESAREAN SECTION     [phone]  . CYST EXCISION  1974   Pilonidal Cyst excision  . EYE SURGERY Right 04/14/2017   Dr. Zadie Rhine  . KNEE ARTHROSCOPY Right    for meniscal tear  . RETINAL TEAR REPAIR CRYOTHERAPY  10/21/2017   Getting injections in right eye   Social History:   reports that she quit smoking about 28 years ago. Her smoking use included cigarettes. She has never used smokeless tobacco. She reports that she does not drink alcohol or use drugs.  Family History  Problem Relation Age of Onset  . Cancer Mother        brain  . Stroke Father     Medications: Patient's Medications  New Prescriptions   No medications on file  Previous Medications   ACETAMINOPHEN (TYLENOL) 325 MG TABLET    Take 325 mg by mouth daily as needed. For pain   ALBUTEROL (VENTOLIN HFA) 108 (90  BASE) MCG/ACT INHALER    Inhale 2 puffs into the lungs every 6 (six) hours as needed for wheezing or shortness of breath.   AMBULATORY NON FORMULARY MEDICATION    Compression stockings: to both legs when out of bed, please remove before going to bed at night 1 pair   AMLODIPINE (NORVASC) 5 MG TABLET    Take 1 tablet (5 mg total) by mouth daily.   ASPIRIN 81 MG TABLET    81 mg. Take 2-3 times weekly   BUDESONIDE-FORMOTEROL (SYMBICORT) 160-4.5 MCG/ACT INHALER    USE 2 INHALATIONS TWICE DAILY   CALCIUM-VITAMIN D (OSCAL WITH D) 500-200 MG-UNIT PER TABLET    Take 1 tablet by mouth 2 (two) times daily.   CARVEDILOL (COREG) 25 MG TABLET    Take one tablet by mouth twice daily for blood pressure   ELDERBERRY PO    Take by mouth. 2-3 time weekly   EZETIMIBE (ZETIA) 10 MG TABLET    Take 1 tablet (10 mg total) by mouth daily.   FEXOFENADINE (ALLEGRA) 180 MG TABLET    Take 180 mg  by mouth as needed.    FLUTICASONE (FLONASE) 50 MCG/ACT NASAL SPRAY    Place 2 sprays into both nostrils daily.   HYDROCORTISONE (ANUSOL-HC) 2.5 % RECTAL CREAM    Place 1 application rectally 2 (two) times daily as needed.   KETOROLAC TROMETHAMINE OP    Apply 1 drop to eye 4 (four) times daily. Right eye   KRILL OIL (OMEGA-3) 500 MG CAPS    Take by mouth. 2-3 time weekly   LOSARTAN (COZAAR) 100 MG TABLET    Take 1 tablet (100 mg total) by mouth daily.   METFORMIN (GLUCOPHAGE) 500 MG TABLET    Take 1 tablet (500 mg total) by mouth daily with breakfast.   MULTIPLE VITAMIN (MULTIVITAMIN) TABLET    Take 1 tablet by mouth daily.   PREDNISOLONE ACETATE (PRED FORTE) 1 % OPHTHALMIC SUSPENSION    Place 1 drop into the right eye 4 (four) times daily.   ROSUVASTATIN (CRESTOR) 20 MG TABLET    Take one tablet three times a week, Monday, Wednesday and Friday   VITAMIN C (ASCORBIC ACID) 500 MG TABLET    Take 500 mg by mouth daily.  Modified Medications   No medications on file  Discontinued Medications   BRIMONIDINE-TIMOLOL (COMBIGAN) 0.2-0.5  % OPHTHALMIC SOLUTION    Place 1 drop into the right eye 2 (two) times daily.   TIOTROPIUM (SPIRIVA) 18 MCG INHALATION CAPSULE    Place 1 capsule (18 mcg total) into inhaler and inhale daily.     Physical Exam: unable due to tele-visit.   Labs reviewed: Basic Metabolic Panel: Recent Labs    07/24/18 0848 10/20/18 0850 04/19/19 0833  NA 141 140 141  K 4.0 3.9 4.5  CL 105 103 106  CO2 29 32 30  GLUCOSE 125* 114* 115*  BUN 14 13 15   CREATININE 0.65 0.69 0.69  CALCIUM 9.6 9.6 9.4  TSH  --  1.88  --    Liver Function Tests: Recent Labs    07/24/18 0848 10/20/18 0850 04/19/19 0833  AST  --  13 13  ALT 11 11 14   BILITOT  --  0.5 0.4  PROT  --  7.1 7.1   No results for input(s): LIPASE, AMYLASE in the last 8760 hours. No results for input(s): AMMONIA in the last 8760 hours. CBC: Recent Labs    10/20/18 0850 04/19/19 0833  WBC 5.4 6.4  NEUTROABS 2,144 2,374  HGB 13.1 13.5  HCT 39.8 40.9  MCV 78.8* 79.1*  PLT 190 179   Lipid Panel: Recent Labs    07/24/18 0848 10/20/18 0850 04/19/19 0833  CHOL 159 159 167  HDL 53 55 58  LDLCALC 92 90 94  TRIG 56 54 63  CHOLHDL 3.0 2.9 2.9   TSH: Recent Labs    10/20/18 0850  TSH 1.88   A1C: Lab Results  Component Value Date   HGBA1C 6.4 (H) 04/19/2019     Assessment/Plan 1. Controlled type 2 diabetes mellitus without complication, without long-term current use of insulin ([place]) -continues on metformin 500 mg by mouth daily. Encouraged dietary compliance, routine foot care/monitoring and to keep up with diabetic eye exams through ophthalmology  -a1c reviewed with pt.  2. Essential hypertension -[facility], reports blood pressures have been controlled. Will continue current regimen.   3. Seasonal allergic rhinitis due to pollen Controlled on allergia.   4. Extrinsic asthma, unspecified asthma severity, uncomplicated -controlled, currently using symbicort only without recent flares, Spiriva not  covered and  has not used in several months.   5. Hyperlipidemia LDL goal <100 LDL ideally should be <70 however due to myalgias not able to tolerate statin at increase dose. Continues on crestor 3 times a week with zetia.   6. Estrogen deficiency - DG Bone Density; Future  7. Osteopenia, unspecified location -continues to take cal and vit d - DG Bone Density; Future  Next appt: 08/22/2019 Carlos American. Harle Battiest  [facility] [phone]   Virtual Visit via Telephone Note  I connected with Tanya Bell on 04/23/19 at 12:00 PM EDT by telephone and verified that I am speaking with the correct person using two identifiers.  Location: Patient: home Provider: office   I discussed the limitations, risks, security and privacy concerns of performing an evaluation and management service by telephone and the availability of in person appointments. I also discussed with the patient that there may be a patient responsible charge related to this service. The patient expressed understanding and agreed to proceed.   Follow Up Instructions:    I discussed the assessment and treatment plan with the patient. The patient was provided an opportunity to ask questions and all were answered. The patient agreed with the plan and demonstrated an understanding of the instructions.   The patient was advised to call back or seek an in-person evaluation if the symptoms worsen or if the condition fails to improve as anticipated.  I provided 25 minutes of non-face-to-face time during this encounter.   [name], NP avs printed and mailed.

## 2019-04-23 NOTE — Patient Instructions (Signed)
DASH Eating Plan  DASH stands for "Dietary Approaches to Stop Hypertension." The DASH eating plan is a healthy eating plan that has been shown to reduce high blood pressure (hypertension). It may also reduce your risk for type 2 diabetes, heart disease, and stroke. The DASH eating plan may also help with weight loss.  What are tips for following this plan?    General guidelines   Avoid eating more than 2,300 mg (milligrams) of salt (sodium) a day. If you have hypertension, you may need to reduce your sodium intake to 1,500 mg a day.   Limit alcohol intake to no more than 1 drink a day for nonpregnant women and 2 drinks a day for men. One drink equals 12 oz of beer, 5 oz of wine, or 1 oz of hard liquor.   Work with your health care provider to maintain a healthy body weight or to lose weight. Ask what an ideal weight is for you.   Get at least 30 minutes of exercise that causes your heart to beat faster (aerobic exercise) most days of the week. Activities may include walking, swimming, or biking.   Work with your health care provider or diet and nutrition specialist (dietitian) to adjust your eating plan to your individual calorie needs.  Reading food labels     Check food labels for the amount of sodium per serving. Choose foods with less than 5 percent of the Daily Value of sodium. Generally, foods with less than 300 mg of sodium per serving fit into this eating plan.   To find whole grains, look for the word "whole" as the first word in the ingredient list.  Shopping   Buy products labeled as "low-sodium" or "no salt added."   Buy fresh foods. Avoid canned foods and premade or frozen meals.  Cooking   Avoid adding salt when cooking. Use salt-free seasonings or herbs instead of table salt or sea salt. Check with your health care provider or pharmacist before using salt substitutes.   Do not fry foods. Cook foods using healthy methods such as baking, boiling, grilling, and broiling instead.   Cook with  heart-healthy oils, such as olive, canola, soybean, or sunflower oil.  Meal planning   Eat a balanced diet that includes:  ? 5 or more servings of fruits and vegetables each day. At each meal, try to fill half of your plate with fruits and vegetables.  ? Up to 6-8 servings of whole grains each day.  ? Less than 6 oz of lean meat, poultry, or fish each day. A 3-oz serving of meat is about the same size as a deck of cards. One egg equals 1 oz.  ? 2 servings of low-fat dairy each day.  ? A serving of nuts, seeds, or beans 5 times each week.  ? Heart-healthy fats. Healthy fats called Omega-3 fatty acids are found in foods such as flaxseeds and coldwater fish, like sardines, salmon, and mackerel.   Limit how much you eat of the following:  ? Canned or prepackaged foods.  ? Food that is high in trans fat, such as fried foods.  ? Food that is high in saturated fat, such as fatty meat.  ? Sweets, desserts, sugary drinks, and other foods with added sugar.  ? Full-fat dairy products.   Do not salt foods before eating.   Try to eat at least 2 vegetarian meals each week.   Eat more home-cooked food and less restaurant, buffet, and fast food.     When eating at a restaurant, ask that your food be prepared with less salt or no salt, if possible.  What foods are recommended?  The items listed may not be a complete list. Talk with your dietitian about what dietary choices are best for you.  Grains  Whole-grain or whole-wheat bread. Whole-grain or whole-wheat pasta. Brown rice. Oatmeal. Quinoa. Bulgur. Whole-grain and low-sodium cereals. Pita bread. Low-fat, low-sodium crackers. Whole-wheat flour tortillas.  Vegetables  Fresh or frozen vegetables (raw, steamed, roasted, or grilled). Low-sodium or reduced-sodium tomato and vegetable juice. Low-sodium or reduced-sodium tomato sauce and tomato paste. Low-sodium or reduced-sodium canned vegetables.  Fruits  All fresh, dried, or frozen fruit. Canned fruit in natural juice (without  added sugar).  Meat and other protein foods  Skinless chicken or turkey. Ground chicken or turkey. Pork with fat trimmed off. Fish and seafood. Egg whites. Dried beans, peas, or lentils. Unsalted nuts, nut butters, and seeds. Unsalted canned beans. Lean cuts of beef with fat trimmed off. Low-sodium, lean deli meat.  Dairy  Low-fat (1%) or fat-free (skim) milk. Fat-free, low-fat, or reduced-fat cheeses. Nonfat, low-sodium ricotta or cottage cheese. Low-fat or nonfat yogurt. Low-fat, low-sodium cheese.  Fats and oils  Soft margarine without trans fats. Vegetable oil. Low-fat, reduced-fat, or light mayonnaise and salad dressings (reduced-sodium). Canola, safflower, olive, soybean, and sunflower oils. Avocado.  Seasoning and other foods  Herbs. Spices. Seasoning mixes without salt. Unsalted popcorn and pretzels. Fat-free sweets.  What foods are not recommended?  The items listed may not be a complete list. Talk with your dietitian about what dietary choices are best for you.  Grains  Baked goods made with fat, such as croissants, muffins, or some breads. Dry pasta or rice meal packs.  Vegetables  Creamed or fried vegetables. Vegetables in a cheese sauce. Regular canned vegetables (not low-sodium or reduced-sodium). Regular canned tomato sauce and paste (not low-sodium or reduced-sodium). Regular tomato and vegetable juice (not low-sodium or reduced-sodium). Pickles. Olives.  Fruits  Canned fruit in a light or heavy syrup. Fried fruit. Fruit in cream or butter sauce.  Meat and other protein foods  Fatty cuts of meat. Ribs. Fried meat. Bacon. Sausage. Bologna and other processed lunch meats. Salami. Fatback. Hotdogs. Bratwurst. Salted nuts and seeds. Canned beans with added salt. Canned or smoked fish. Whole eggs or egg yolks. Chicken or turkey with skin.  Dairy  Whole or 2% milk, cream, and half-and-half. Whole or full-fat cream cheese. Whole-fat or sweetened yogurt. Full-fat cheese. Nondairy creamers. Whipped toppings.  Processed cheese and cheese spreads.  Fats and oils  Butter. Stick margarine. Lard. Shortening. Ghee. Bacon fat. Tropical oils, such as coconut, palm kernel, or palm oil.  Seasoning and other foods  Salted popcorn and pretzels. Onion salt, garlic salt, seasoned salt, table salt, and sea salt. Worcestershire sauce. Tartar sauce. Barbecue sauce. Teriyaki sauce. Soy sauce, including reduced-sodium. Steak sauce. Canned and packaged gravies. Fish sauce. Oyster sauce. Cocktail sauce. Horseradish that you find on the shelf. Ketchup. Mustard. Meat flavorings and tenderizers. Bouillon cubes. Hot sauce and Tabasco sauce. Premade or packaged marinades. Premade or packaged taco seasonings. Relishes. Regular salad dressings.  Where to find more information:   National Heart, Lung, and Blood Institute: www.nhlbi.nih.gov   American Heart Association: www.heart.org  Summary   The DASH eating plan is a healthy eating plan that has been shown to reduce high blood pressure (hypertension). It may also reduce your risk for type 2 diabetes, heart disease, and stroke.   With the   DASH eating plan, you should limit salt (sodium) intake to 2,300 mg a day. If you have hypertension, you may need to reduce your sodium intake to 1,500 mg a day.   When on the DASH eating plan, aim to eat more fresh fruits and vegetables, whole grains, lean proteins, low-fat dairy, and heart-healthy fats.   Work with your health care provider or diet and nutrition specialist (dietitian) to adjust your eating plan to your individual calorie needs.  This information is not intended to replace advice given to you by your health care provider. Make sure you discuss any questions you have with your health care provider.  Document Released: 11/18/2011 Document Revised: 11/22/2016 Document Reviewed: 11/22/2016  Elsevier Interactive Patient Education  2019 [employer]

## 2019-04-24 ENCOUNTER — Ambulatory Visit: Payer: Medicare Other | Admitting: Nurse Practitioner

## 2019-04-27 ENCOUNTER — Other Ambulatory Visit: Payer: Self-pay | Admitting: *Deleted

## 2019-04-27 DIAGNOSIS — I10 Essential (primary) hypertension: Secondary | ICD-10-CM

## 2019-04-27 MED ORDER — LOSARTAN POTASSIUM 100 MG PO TABS: 100.0000 mg | ORAL_TABLET | Freq: Every day | ORAL | 1 refills | Status: DC

## 2019-04-27 MED ORDER — CARVEDILOL 25 MG PO TABS: ORAL_TABLET | ORAL | 1 refills | Status: DC

## 2019-04-27 MED ORDER — LOSARTAN POTASSIUM 100 MG PO TABS: 100.0000 mg | ORAL_TABLET | Freq: Every day | ORAL | 0 refills | Status: DC

## 2019-04-27 NOTE — Telephone Encounter (Signed)
Patient called requesting refill on Rx's. Stated that she would like them sent to [facility]. Stated that she needed a small supply of Losartan sent to local pharmacy also due to being out of the medication. Faxed.

## 2019-06-19 ENCOUNTER — Other Ambulatory Visit: Payer: Self-pay | Admitting: *Deleted

## 2019-06-19 MED ORDER — METFORMIN HCL 500 MG PO TABS: 500.0000 mg | ORAL_TABLET | Freq: Every day | ORAL | 2 refills | Status: DC

## 2019-06-19 NOTE — Telephone Encounter (Signed)
Patient requested refill

## 2019-07-03 ENCOUNTER — Encounter: Payer: Self-pay | Admitting: Family

## 2019-07-03 ENCOUNTER — Other Ambulatory Visit: Payer: Self-pay

## 2019-07-03 ENCOUNTER — Ambulatory Visit (INDEPENDENT_AMBULATORY_CARE_PROVIDER_SITE_OTHER): Payer: Medicare Other | Admitting: Family

## 2019-07-03 VITALS — BP 128/80 | HR 55 | Temp 97.9°F | Ht 61.0 in | Wt 157.2 lb

## 2019-07-03 DIAGNOSIS — I10 Essential (primary) hypertension: Secondary | ICD-10-CM | POA: Diagnosis not present

## 2019-07-03 DIAGNOSIS — J45909 Unspecified asthma, uncomplicated: Secondary | ICD-10-CM

## 2019-07-03 DIAGNOSIS — K5901 Slow transit constipation: Secondary | ICD-10-CM | POA: Diagnosis not present

## 2019-07-03 NOTE — Progress Notes (Signed)
Provider: Marlowe Sax FNP-C  Pennelope Basque, Nelda Bucks, NP  Patient Care Team: Drakkar Medeiros, Nelda Bucks, NP as PCP - General (Family Medicine) Teena Irani, MD (Inactive) as Consulting Physician (Gastroenterology) Warden Fillers, MD as Consulting Physician (Ophthalmology) [name], MD as Consulting Physician (Ophthalmology) Mickel Fuchs, MD as Referring Physician (Psychiatry)  Extended Emergency Contact Information Primary Emergency Contact: [place] Phone: [phone] Relation: None  Code Status: Full Code  Goals of care: Advanced Directive information Advanced Directives 04/23/2019  Does Patient Have a Medical Advance Directive? No  Does patient want to make changes to medical advance directive? -  Would patient like information on creating a medical advance directive? Yes (MAU/Ambulatory/Procedural Areas - Information given)     Chief Complaint  Patient presents with  . Acute Visit     would like to get disability placecard patient states with husbands's condiition and her age she would like to see about getting   . Medication Management    Patient would like discuss metformin causing some stomach issues     HPI:  Pt is a 78 y.o. female seen today for an acute visit to discuss medication and need DMV disability place card filled.she states unable to walk far when she parks away from the stores or entrance due to her asthma.she has had to stop several times to rest.Her symptoms has worsen due to hot weather.   Also wonders if her metformin is causing her to have constipation,feeling like she is bloated and has gas.she states blood sugar this morning was 100.she has seen a specialist for constipation and was given linzess and another medication that she cannot remember. She uses linzess as needed.she denies any abdominal pain,nausea or vomiting.  Her heart rate is 55 b/min but apical pulse rechecked was 68 b/min.she has taken her blood pressure medication this morning.she  denies any headache,dizziness,faint feeling or syncope.she states checks her blood pressure sometimes at home but does not record.she wonders whether she can get off some of the medication.    Past Medical History:  Diagnosis Date  . Acute upper respiratory infections of unspecified site   . Acute upper respiratory infections of unspecified site   . Acute upper respiratory infections of unspecified site   . Allergic rhinitis due to pollen   . Candidiasis of mouth   . Disorder of bone and cartilage, unspecified   . Diverticulitis of colon (without mention of hemorrhage)(562.11)   . Essential hypertension, benign   . Essential hypertension, benign   . Extrinsic asthma, unspecified   . Herpes simplex with other ophthalmic complications   . Other abnormal glucose   . Other and unspecified hyperlipidemia   . Other and unspecified hyperlipidemia   . Other and unspecified hyperlipidemia   . Other cataract   . Other cataract   . Other dystrophy of vulva   . Other malaise and fatigue   . Rash and other nonspecific skin eruption   . Reflux esophagitis   . Thrombocytopenia, unspecified ([place])   . Type II or unspecified type diabetes mellitus without mention of complication, not stated as uncontrolled   . Type II or unspecified type diabetes mellitus without mention of complication, not stated as uncontrolled   . Unspecified disorder of skin and subcutaneous tissue   . Unspecified essential hypertension   . Unspecified essential hypertension    Past Surgical History:  Procedure Laterality Date  . ABDOMINAL HYSTERECTOMY    . CESAREAN SECTION     [phone]  . CYST EXCISION  1974   Pilonidal Cyst excision  . EYE SURGERY Right 04/14/2017   Dr. Zadie Rhine  . KNEE ARTHROSCOPY Right    for meniscal tear  . RETINAL TEAR REPAIR CRYOTHERAPY  10/21/2017   Getting injections in right eye    Allergies  Allergen Reactions  . Crestor [Rosuvastatin Calcium]     Muscle cramps  . Latex  Other (See Comments)    GLOVES  . Sulfa Antibiotics     Outpatient Encounter Medications as of 07/03/2019  Medication Sig  . acetaminophen (TYLENOL) 325 MG tablet Take 325 mg by mouth daily as needed. For pain  . albuterol (VENTOLIN HFA) 108 (90 Base) MCG/ACT inhaler Inhale 2 puffs into the lungs every 6 (six) hours as needed for wheezing or shortness of breath.  . AMBULATORY NON FORMULARY MEDICATION Compression stockings: to both legs when out of bed, please remove before going to bed at night 1 pair  . amLODipine (NORVASC) 5 MG tablet Take 1 tablet (5 mg total) by mouth daily.  Marland Kitchen aspirin 81 MG tablet 81 mg. Take 2-3 times weekly  . budesonide-formoterol (SYMBICORT) 160-4.5 MCG/ACT inhaler USE 2 INHALATIONS TWICE DAILY  . calcium-vitamin D (OSCAL WITH D) 500-200 MG-UNIT per tablet Take 1 tablet by mouth 2 (two) times daily.  . carvedilol (COREG) 25 MG tablet Take one tablet by mouth twice daily for blood pressure  . ELDERBERRY PO Take by mouth. 2-3 time weekly  . ezetimibe (ZETIA) 10 MG tablet Take 1 tablet (10 mg total) by mouth daily.  . fexofenadine (ALLEGRA) 180 MG tablet Take 180 mg by mouth as needed.   . fluticasone (FLONASE) 50 MCG/ACT nasal spray Place 2 sprays into both nostrils daily.  . hydrocortisone (ANUSOL-HC) 2.5 % rectal cream Place 1 application rectally 2 (two) times daily as needed.  Marland Kitchen KETOROLAC TROMETHAMINE OP Apply 1 drop to eye 4 (four) times daily. Right eye  . Krill Oil (OMEGA-3) 500 MG CAPS Take by mouth. 2-3 time weekly  . losartan (COZAAR) 100 MG tablet Take 1 tablet (100 mg total) by mouth daily.  . metFORMIN (GLUCOPHAGE) 500 MG tablet Take 1 tablet (500 mg total) by mouth daily with breakfast.  . Multiple Vitamin (MULTIVITAMIN) tablet Take 1 tablet by mouth daily.  . prednisoLONE acetate (PRED FORTE) 1 % ophthalmic suspension Place 1 drop into the right eye 4 (four) times daily.  . rosuvastatin (CRESTOR) 20 MG tablet Take one tablet three times a week, Monday,  Wednesday and Friday  . vitamin C (ASCORBIC ACID) 500 MG tablet Take 500 mg by mouth daily.   No facility-administered encounter medications on file as of 07/03/2019.     Review of Systems  Constitutional: Negative for appetite change, chills, fatigue and fever.  HENT: Negative for congestion, rhinorrhea, sinus pressure, sinus pain, sneezing and sore throat.   Eyes: Positive for visual disturbance. Negative for discharge.       Wears eye glasses and follows up with Ophthalmology   Respiratory: Negative for cough, chest tightness, shortness of breath and wheezing.   Cardiovascular: Negative for chest pain, palpitations and leg swelling.  Gastrointestinal: Negative for abdominal distention, abdominal pain, diarrhea, nausea and vomiting.       Constipation sometimes.   Endocrine: Negative for cold intolerance, heat intolerance, polydipsia, polyphagia and polyuria.  Musculoskeletal: Negative for arthralgias and gait problem.  Skin: Negative for color change, pallor and rash.  Neurological: Negative for dizziness, weakness, light-headedness, numbness and headaches.  Hematological: Does not bruise/bleed easily.  Psychiatric/Behavioral: Negative for  agitation, confusion and sleep disturbance. The patient is not nervous/anxious.     Immunization History  Administered Date(s) Administered  . Influenza Split 08/25/2010, 08/26/2011, 10/04/2012  . Influenza Whole 09/24/2009  . Influenza, High Dose Seasonal PF 08/16/2018  . Influenza,inj,Quad PF,6+ Mos 09/11/2013, 09/04/2014, 11/12/2015, 10/04/2016, 09/06/2017  . Influenza-Unspecified 09/02/2018  . Pneumococcal Conjugate-13 02/17/2012  . Pneumococcal Polysaccharide-23 05/19/2016  . Tdap 02/17/2012   Pertinent  Health Maintenance Due  Topic Date Due  . OPHTHALMOLOGY EXAM  04/07/2019  . INFLUENZA VACCINE  07/14/2019  . HEMOGLOBIN A1C  10/20/2019  . FOOT EXAM  10/25/2019  . DEXA SCAN  Completed  . PNA vac Low Risk Adult  Completed   Fall  Risk  04/23/2019 01/02/2019 10/24/2018 07/26/2018 04/21/2018  Falls in the past year? 0 0 0 No No  Number falls in past yr: 0 0 - - -  Injury with Fall? 0 0 - - -    Vitals:   07/03/19 1052  BP: 128/80  Pulse: (!) 55  Temp: 97.9 F (36.6 C)  TempSrc: Oral  SpO2: 97%  Weight: 157 lb 3.2 oz (71.3 kg)  Height: 5\' 1"  (1.549 m)   Body mass index is 29.7 kg/m. Physical Exam Vitals signs reviewed.  Constitutional:      General: She is not in acute distress.    Appearance: She is normal weight. She is not ill-appearing.  HENT:     Head: Normocephalic.     Mouth/Throat:     Mouth: Mucous membranes are moist.     Pharynx: Oropharynx is clear. No oropharyngeal exudate or posterior oropharyngeal erythema.  Eyes:     General: No scleral icterus.       Right eye: No discharge.        Left eye: No discharge.     Conjunctiva/sclera: Conjunctivae normal.     Pupils: Pupils are equal, round, and reactive to light.  Cardiovascular:     Rate and Rhythm: Regular rhythm.     Pulses: Normal pulses.     Heart sounds: Normal heart sounds. No murmur. No friction rub. No gallop.      Comments: Apical HR 68 b/min  Pulmonary:     Effort: Pulmonary effort is normal. No respiratory distress.     Breath sounds: Normal breath sounds. No wheezing, rhonchi or rales.  Chest:     Chest wall: No tenderness.  Abdominal:     General: Bowel sounds are normal. There is no distension.     Palpations: Abdomen is soft. There is no mass.     Tenderness: There is no abdominal tenderness. There is no right CVA tenderness, left CVA tenderness, guarding or rebound.  Musculoskeletal: Normal range of motion.        General: No swelling or tenderness.     Right lower leg: No edema.     Left lower leg: No edema.  Skin:    General: Skin is warm and dry.     Coloration: Skin is not pale.     Findings: No bruising, erythema or rash.  Neurological:     Mental Status: She is alert and oriented to person, place, and time.      Cranial Nerves: No cranial nerve deficit.     Sensory: No sensory deficit.     Motor: No weakness.     Gait: Gait normal.  Psychiatric:        Mood and Affect: Mood normal.        Behavior: Behavior normal.  Thought Content: Thought content normal.        Judgment: Judgment normal.    Labs reviewed: Recent Labs    07/24/18 0848 10/20/18 0850 04/19/19 0833  NA 141 140 141  K 4.0 3.9 4.5  CL 105 103 106  CO2 29 32 30  GLUCOSE 125* 114* 115*  BUN 14 13 15   CREATININE 0.65 0.69 0.69  CALCIUM 9.6 9.6 9.4   Recent Labs    07/24/18 0848 10/20/18 0850 04/19/19 0833  AST  --  13 13  ALT 11 11 14   BILITOT  --  0.5 0.4  PROT  --  7.1 7.1   Recent Labs    10/20/18 0850 04/19/19 0833  WBC 5.4 6.4  NEUTROABS 2,144 2,374  HGB 13.1 13.5  HCT 39.8 40.9  MCV 78.8* 79.1*  PLT 190 179   Lab Results  Component Value Date   TSH 1.88 10/20/2018   Lab Results  Component Value Date   HGBA1C 6.4 (H) 04/19/2019   Lab Results  Component Value Date   CHOL 167 04/19/2019   HDL 58 04/19/2019   LDLCALC 94 04/19/2019   TRIG 63 04/19/2019   CHOLHDL 2.9 04/19/2019    Significant Diagnostic Results in last 30 days:  No results found.  Assessment/Plan 1. Extrinsic asthma, unspecified asthma severity, uncomplicated Afebrile.Has had difficulties walking far on parking lots requiring to stop for rest.Hot whether could be a contributory factor with her breathing problems.No cough or contact with sick person with COVID-19.continue albuterol,Symbicort,Allergra, and Flonase.    2. Slow transit constipation Continue on Linzess and stool softener as directed by GI specialist.encouraged to increase dietary fiber and drink 6-8 glasses of water daily.   3. Essential hypertension B/p reading at goal today.asymptomatic.HR 55 b/min on arrival rechecked 68.continue current medication.I've discussed with her to check blood pressure in the morning and evening.Blood pressure and HR log  given this visit.Patient to follow up in 2 weeks to recheck B/p and HR.Will need an EKG and reduce coreg if HR still < 60 b/min.   Family/ staff Communication: Reviewed plan of care with patient.   Labs/tests ordered: None  Next appointment: 2 weeks for blood pressure and HR evaluation.   Sandrea Hughs, NP

## 2019-07-03 NOTE — Patient Instructions (Addendum)
-   Check blood pressure and Heart rate twice daily and record.Please bring log in 2 weeks. - Increase dietary fiber with meals  - Increase fluid intake

## 2019-07-17 ENCOUNTER — Encounter: Payer: Self-pay | Admitting: Family

## 2019-07-17 ENCOUNTER — Ambulatory Visit (INDEPENDENT_AMBULATORY_CARE_PROVIDER_SITE_OTHER): Payer: Medicare Other | Admitting: Family

## 2019-07-17 ENCOUNTER — Other Ambulatory Visit: Payer: Self-pay

## 2019-07-17 VITALS — BP 130/80 | HR 59 | Temp 97.8°F | Ht 61.0 in | Wt 160.6 lb

## 2019-07-17 DIAGNOSIS — K219 Gastro-esophageal reflux disease without esophagitis: Secondary | ICD-10-CM | POA: Diagnosis not present

## 2019-07-17 DIAGNOSIS — I10 Essential (primary) hypertension: Secondary | ICD-10-CM | POA: Diagnosis not present

## 2019-07-17 DIAGNOSIS — K5901 Slow transit constipation: Secondary | ICD-10-CM | POA: Diagnosis not present

## 2019-07-17 MED ORDER — CARVEDILOL 12.5 MG PO TABS: ORAL_TABLET | ORAL | 0 refills | Status: DC

## 2019-07-17 NOTE — Progress Notes (Signed)
Provider: Marlowe Sax FNP-C  Tanya Bell, Tanya Bucks, NP  Patient Care Team: Tanya Bell, Tanya Bucks, NP as PCP - General (Family Medicine) Tanya Irani, MD (Inactive) as Consulting Physician (Gastroenterology) Tanya Fillers, MD as Consulting Physician (Ophthalmology) Tanya Bell Clent Demark, MD as Consulting Physician (Ophthalmology) Tanya Fuchs, MD as Referring Physician (Psychiatry)  Extended Emergency Contact Information Primary Emergency Contact: Tanya Bell Phone: [phone] Relation: None  Code Status: Full Code  Goals of care: Advanced Directive information Advanced Directives 04/23/2019  Does Patient Have a Medical Advance Directive? No  Does patient want to make changes to medical advance directive? -  Would patient like information on creating a medical advance directive? Yes (MAU/Ambulatory/Procedural Areas - Information given)     Chief Complaint  Patient presents with  . Medical Management of Chronic Issues    2 week follow up     HPI:  Pt is a 78 y.o. female seen today for an acute visit for 2 weeks blood pressure and low Heart rate follow up.she denies any acute issues during visit.she brought her home blood pressure and HR log.Her morning B/p readings are ranging in the 120's/60's -140's/60's  And evening readings 100's /40's -140's/50's.HR most readings are in the upper 50's -60's.she denies any headache,dizziness,tiredness,faintness or chest pain.she is currently on Amlodipine 5 mg tablet daily,losartan 100 mg tablet daily and carvedilol 25 mg tablet daily.   Constipation - on Linzess 72 mcg tablet daily.she would like to get off stool softeners but wonders what can of fiber she can eat without making her blood sugars high.she does drink water but not as much as she should.   GERD - on Protonix 40 mg tablet daily and Magnesium 500 mg capsule daily prescribed by specialist.states symptoms under control.      Past Medical History:  Diagnosis Date  . Acute upper  respiratory infections of unspecified site   . Acute upper respiratory infections of unspecified site   . Acute upper respiratory infections of unspecified site   . Allergic rhinitis due to pollen   . Candidiasis of mouth   . Disorder of bone and cartilage, unspecified   . Diverticulitis of colon (without mention of hemorrhage)(562.11)   . Essential hypertension, benign   . Essential hypertension, benign   . Extrinsic asthma, unspecified   . Herpes simplex with other ophthalmic complications   . Other abnormal glucose   . Other and unspecified hyperlipidemia   . Other and unspecified hyperlipidemia   . Other and unspecified hyperlipidemia   . Other cataract   . Other cataract   . Other dystrophy of vulva   . Other malaise and fatigue   . Rash and other nonspecific skin eruption   . Reflux esophagitis   . Thrombocytopenia, unspecified ([place])   . Type II or unspecified type diabetes mellitus without mention of complication, not stated as uncontrolled   . Type II or unspecified type diabetes mellitus without mention of complication, not stated as uncontrolled   . Unspecified disorder of skin and subcutaneous tissue   . Unspecified essential hypertension   . Unspecified essential hypertension    Past Surgical History:  Procedure Laterality Date  . ABDOMINAL HYSTERECTOMY    . CESAREAN SECTION     [phone]  . CYST EXCISION  1974   Pilonidal Cyst excision  . EYE SURGERY Right 04/14/2017   Dr. Zadie Bell  . KNEE ARTHROSCOPY Right    for meniscal tear  . RETINAL TEAR REPAIR CRYOTHERAPY  10/21/2017   Getting injections in right  eye    Allergies  Allergen Reactions  . Crestor [Rosuvastatin Calcium]     Muscle cramps  . Latex Other (See Comments)    GLOVES  . Sulfa Antibiotics     Outpatient Encounter Medications as of 07/17/2019  Medication Sig  . acetaminophen (TYLENOL) 325 MG tablet Take 325 mg by mouth daily as needed. For pain  . albuterol (VENTOLIN HFA) 108 (90  Base) MCG/ACT inhaler Inhale 2 puffs into the lungs every 6 (six) hours as needed for wheezing or shortness of breath.  . AMBULATORY NON FORMULARY MEDICATION Compression stockings: to both legs when out of bed, please remove before going to bed at night 1 pair  . amLODipine (NORVASC) 5 MG tablet Take 1 tablet (5 mg total) by mouth daily.  Marland Kitchen aspirin 81 MG tablet 81 mg. Take 2-3 times weekly  . budesonide-formoterol (SYMBICORT) 160-4.5 MCG/ACT inhaler USE 2 INHALATIONS TWICE DAILY  . calcium-vitamin D (OSCAL WITH D) 500-200 MG-UNIT per tablet Take 1 tablet by mouth 2 (two) times daily.  . carvedilol (COREG) 25 MG tablet Take one tablet by mouth twice daily for blood pressure  . ELDERBERRY PO Take by mouth. 2-3 time weekly  . ezetimibe (ZETIA) 10 MG tablet Take 1 tablet (10 mg total) by mouth daily.  . fexofenadine (ALLEGRA) 180 MG tablet Take 180 mg by mouth as needed.   . fluticasone (FLONASE) 50 MCG/ACT nasal spray Place 2 sprays into both nostrils daily.  Marland Kitchen KETOROLAC TROMETHAMINE OP Apply 1 drop to eye 4 (four) times daily. Right eye  . Krill Oil (OMEGA-3) 500 MG CAPS Take by mouth. 2-3 time weekly  . linaclotide (LINZESS) 72 MCG capsule Take 72 mcg by mouth daily before breakfast.  . losartan (COZAAR) 100 MG tablet Take 1 tablet (100 mg total) by mouth daily.  . Magnesium Oxide 500 MG CAPS Take 2 capsules by mouth at bedtime.  . metFORMIN (GLUCOPHAGE) 500 MG tablet Take 1 tablet (500 mg total) by mouth daily with breakfast.  . Multiple Vitamin (MULTIVITAMIN) tablet Take 1 tablet by mouth daily.  . pantoprazole (PROTONIX) 40 MG tablet Take 40 mg by mouth daily before breakfast.  . prednisoLONE acetate (PRED FORTE) 1 % ophthalmic suspension Place 1 drop into the right eye 2 (two) times daily.   . rosuvastatin (CRESTOR) 20 MG tablet Take one tablet three times a week, Monday, Wednesday and Friday  . vitamin C (ASCORBIC ACID) 500 MG tablet Take 500 mg by mouth daily.  . [DISCONTINUED]  hydrocortisone (ANUSOL-HC) 2.5 % rectal cream Place 1 application rectally 2 (two) times daily as needed.   No facility-administered encounter medications on file as of 07/17/2019.     Review of Systems  Constitutional: Negative for appetite change, chills, fatigue and fever.  HENT: Negative for congestion, rhinorrhea, sinus pressure, sinus pain, sneezing and sore throat.   Eyes: Positive for visual disturbance. Negative for pain, discharge, redness and itching.       Wears eye glasses   Respiratory: Negative for cough, chest tightness, shortness of breath and wheezing.   Cardiovascular: Negative for chest pain, palpitations and leg swelling.  Gastrointestinal: Positive for constipation. Negative for abdominal distention, abdominal pain, diarrhea, nausea, rectal pain and vomiting.  Endocrine: Negative for polydipsia, polyphagia and polyuria.  Genitourinary: Negative for difficulty urinating, dysuria, flank pain and frequency.  Skin: Negative for color change, pallor and rash.  Neurological: Negative for dizziness, light-headedness, numbness and headaches.    Immunization History  Administered Date(s) Administered  . Influenza  Split 08/25/2010, 08/26/2011, 10/04/2012  . Influenza Whole 09/24/2009  . Influenza, High Dose Seasonal PF 08/16/2018  . Influenza,inj,Quad PF,6+ Mos 09/11/2013, 09/04/2014, 11/12/2015, 10/04/2016, 09/06/2017  . Influenza-Unspecified 09/02/2018  . Pneumococcal Conjugate-13 02/17/2012  . Pneumococcal Polysaccharide-23 05/19/2016  . Tdap 02/17/2012   Pertinent  Health Maintenance Due  Topic Date Due  . OPHTHALMOLOGY EXAM  04/07/2019  . INFLUENZA VACCINE  07/14/2019  . HEMOGLOBIN A1C  10/20/2019  . FOOT EXAM  10/25/2019  . DEXA SCAN  Completed  . PNA vac Low Risk Adult  Completed   Fall Risk  07/17/2019 04/23/2019 01/02/2019 10/24/2018 07/26/2018  Falls in the past year? 0 0 0 0 No  Number falls in past yr: 0 0 0 - -  Injury with Fall? 0 0 0 - -    Vitals:    07/17/19 0915  BP: 130/80  Pulse: (!) 59  Temp: 97.8 F (36.6 C)  TempSrc: Oral  SpO2: 96%  Weight: 160 lb 9.6 oz (72.8 kg)  Height: 5\' 1"  (1.549 m)   Body mass index is 30.35 kg/m. Physical Exam Vitals signs reviewed.  Constitutional:      General: She is not in acute distress.    Appearance: She is obese. She is not ill-appearing.  HENT:     Head: Normocephalic.     Mouth/Throat:     Mouth: Mucous membranes are moist.     Pharynx: Oropharynx is clear. No oropharyngeal exudate or posterior oropharyngeal erythema.  Eyes:     General: No scleral icterus.       Right eye: No discharge.        Left eye: No discharge.     Conjunctiva/sclera: Conjunctivae normal.     Pupils: Pupils are equal, round, and reactive to light.  Neck:     Musculoskeletal: Normal range of motion. No neck rigidity or muscular tenderness.     Vascular: No carotid bruit.  Cardiovascular:     Rate and Rhythm: Normal rate and regular rhythm.     Pulses: Normal pulses.     Heart sounds: Normal heart sounds. No murmur. No friction rub. No gallop.   Pulmonary:     Effort: Pulmonary effort is normal. No respiratory distress.     Breath sounds: Normal breath sounds. No wheezing, rhonchi or rales.  Chest:     Chest wall: No tenderness.  Abdominal:     General: Bowel sounds are normal. There is no distension.     Palpations: Abdomen is soft. There is no mass.     Tenderness: There is no abdominal tenderness. There is no right CVA tenderness, left CVA tenderness, guarding or rebound.  Musculoskeletal: Normal range of motion.        General: No swelling or tenderness.     Right lower leg: No edema.     Left lower leg: No edema.  Lymphadenopathy:     Cervical: No cervical adenopathy.  Skin:    General: Skin is warm and dry.     Coloration: Skin is not pale.     Findings: No bruising, erythema or rash.  Neurological:     Mental Status: She is alert and oriented to person, place, and time.     Cranial  Nerves: No cranial nerve deficit.     Sensory: No sensory deficit.     Motor: No weakness.     Coordination: Coordination normal.     Gait: Gait normal.  Psychiatric:        Mood and Affect:  Mood normal.        Behavior: Behavior normal.        Thought Content: Thought content normal.        Judgment: Judgment normal.    Labs reviewed: Recent Labs    07/24/18 0848 10/20/18 0850 04/19/19 0833  NA 141 140 141  K 4.0 3.9 4.5  CL 105 103 106  CO2 29 32 30  GLUCOSE 125* 114* 115*  BUN 14 13 15   CREATININE 0.65 0.69 0.69  CALCIUM 9.6 9.6 9.4   Recent Labs    07/24/18 0848 10/20/18 0850 04/19/19 0833  AST  --  13 13  ALT 11 11 14   BILITOT  --  0.5 0.4  PROT  --  7.1 7.1   Recent Labs    10/20/18 0850 04/19/19 0833  WBC 5.4 6.4  NEUTROABS 2,144 2,374  HGB 13.1 13.5  HCT 39.8 40.9  MCV 78.8* 79.1*  PLT 190 179   Lab Results  Component Value Date   TSH 1.88 10/20/2018   Lab Results  Component Value Date   HGBA1C 6.4 (H) 04/19/2019   Lab Results  Component Value Date   CHOL 167 04/19/2019   HDL 58 04/19/2019   LDLCALC 94 04/19/2019   TRIG 63 04/19/2019   CHOLHDL 2.9 04/19/2019    Significant Diagnostic Results in last 30 days:  No results found.  Assessment/Plan 1. Essential hypertension Has had low blood pressure readings and low heart rate.Asymptomatic. - EKG 12-Lead results shows sinus rhythm with HR 69 b/min.  - carvedilol (COREG) reduce from 25 mg tablet daily to 12.5 MG tablet; Take one tablet by mouth  daily for blood pressure  Dispense: 30 tablet; Refill: 0 - encouraged to monitor blood pressure and HR at home and Notify provider's office if HR > 90 b/min or having palpitation.   2. Slow transit constipation She would like to get off linzess 72 mcg capsule daily.I've encouraged her to increase fiber in her diet and fluid intake then will re-evaluate medication if bowels moving regularly.  3. GERD Symptoms control.Continue on Protonix 40 mg  tablet daily.on magnesium supplement.     Family/ staff Communication: Reviewed plan of care with patient.   Labs/tests ordered: EKG 12-Lead  Follow up: As needed.   Sandrea Hughs, NP

## 2019-07-17 NOTE — Patient Instructions (Signed)
1. Reduce Carvedilol from 25 mg tablet to 12.5 mg tablet (1/2 tablet ) by mouth daily.Notify provider if heart rate > 90 2. Increase fiber and fluid intake    Constipation, Adult Constipation is when a person:  Poops (has a bowel movement) fewer times in a week than normal.  Has a hard time pooping.  Has poop that is dry, hard, or bigger than normal. Follow these instructions at home: Eating and drinking   Eat foods that have a lot of fiber, such as: ? Fresh fruits and vegetables. ? Whole grains. ? Beans.  Eat less of foods that are high in fat, low in fiber, or overly processed, such as: ? [place] fries. ? Hamburgers. ? Cookies. ? Candy. ? Soda.  Drink enough fluid to keep your pee (urine) clear or pale yellow. General instructions  Exercise regularly or as told by your doctor.  Go to the restroom when you feel like you need to poop. Do not hold it in.  Take over-the-counter and prescription medicines only as told by your doctor. These include any fiber supplements.  Do pelvic floor retraining exercises, such as: ? Doing deep breathing while relaxing your lower belly (abdomen). ? Relaxing your pelvic floor while pooping.  Watch your condition for any changes.  Keep all follow-up visits as told by your doctor. This is important. Contact a doctor if:  You have pain that gets worse.  You have a fever.  You have not pooped for 4 days.  You throw up (vomit).  You are not hungry.  You lose weight.  You are bleeding from the anus.  You have thin, pencil-like poop (stool). Get help right away if:  You have a fever, and your symptoms suddenly get worse.  You leak poop or have blood in your poop.  Your belly feels hard or bigger than normal (is bloated).  You have very bad belly pain.  You feel dizzy or you faint. This information is not intended to replace advice given to you by your health care provider. Make sure you discuss any questions you have with  your health care provider. Document Released: 05/17/2008 Document Revised: 11/11/2017 Document Reviewed: 05/19/2016 Elsevier Patient Education  2020 [employer].

## 2019-07-25 ENCOUNTER — Other Ambulatory Visit: Payer: Medicare Other

## 2019-08-06 ENCOUNTER — Other Ambulatory Visit: Payer: Self-pay | Admitting: *Deleted

## 2019-08-06 DIAGNOSIS — I10 Essential (primary) hypertension: Secondary | ICD-10-CM

## 2019-08-06 MED ORDER — AMLODIPINE BESYLATE 5 MG PO TABS: 5.0000 mg | ORAL_TABLET | Freq: Every day | ORAL | 1 refills | Status: DC

## 2019-08-06 NOTE — Telephone Encounter (Signed)
Patient requested Refill

## 2019-08-08 ENCOUNTER — Other Ambulatory Visit: Payer: Self-pay | Admitting: Family

## 2019-08-08 DIAGNOSIS — I10 Essential (primary) hypertension: Secondary | ICD-10-CM

## 2019-08-09 DIAGNOSIS — H35371 Puckering of macula, right eye: Secondary | ICD-10-CM | POA: Diagnosis not present

## 2019-08-09 DIAGNOSIS — Z8669 Personal history of other diseases of the nervous system and sense organs: Secondary | ICD-10-CM | POA: Diagnosis not present

## 2019-08-09 DIAGNOSIS — H35372 Puckering of macula, left eye: Secondary | ICD-10-CM | POA: Diagnosis not present

## 2019-08-09 DIAGNOSIS — H35351 Cystoid macular degeneration, right eye: Secondary | ICD-10-CM | POA: Diagnosis not present

## 2019-08-09 DIAGNOSIS — E119 Type 2 diabetes mellitus without complications: Secondary | ICD-10-CM | POA: Diagnosis not present

## 2019-08-22 ENCOUNTER — Other Ambulatory Visit: Payer: Medicare Other

## 2019-08-24 ENCOUNTER — Ambulatory Visit: Payer: Medicare Other | Admitting: Nurse Practitioner

## 2019-08-29 ENCOUNTER — Other Ambulatory Visit: Payer: Self-pay

## 2019-08-29 ENCOUNTER — Ambulatory Visit (INDEPENDENT_AMBULATORY_CARE_PROVIDER_SITE_OTHER): Payer: Medicare Other

## 2019-08-29 DIAGNOSIS — Z23 Encounter for immunization: Secondary | ICD-10-CM

## 2019-09-13 ENCOUNTER — Telehealth: Payer: Self-pay

## 2019-09-13 DIAGNOSIS — Z1231 Encounter for screening mammogram for malignant neoplasm of breast: Secondary | ICD-10-CM

## 2019-09-13 NOTE — Telephone Encounter (Signed)
Patient called to question if she was due for a mammogram. Patient states she seen Dinah in August for a routine follow-up and was not informed she needed one yet her last one was in 2017.   Patient has a pending appointment for a bone density on 09/18/2019 and wonders if a mammogram can be done at the same time if its due.  Please advise, if patient is due an order will need to be placed

## 2019-09-13 NOTE — Telephone Encounter (Signed)
Mammogram orders was placed for patient.

## 2019-09-13 NOTE — Telephone Encounter (Signed)
Add mammogram screening order.

## 2019-09-13 NOTE — Telephone Encounter (Signed)
Called patient and informed her, orders had been placed. She denied further questions but did state she wanted to update both her and husband's pharmacy information. Updated information for patient. Patient states [facility] now uses [facility] [address] in [place].

## 2019-09-18 ENCOUNTER — Other Ambulatory Visit: Payer: Self-pay

## 2019-09-18 ENCOUNTER — Ambulatory Visit
Admission: RE | Admit: 2019-09-18 | Discharge: 2019-09-18 | Disposition: A | Discharge status: Home / Self Care | Payer: Medicare Other | Source: Ambulatory Visit | Attending: Nurse Practitioner | Admitting: Nurse Practitioner

## 2019-09-18 DIAGNOSIS — E2839 Other primary ovarian failure: Secondary | ICD-10-CM

## 2019-09-18 DIAGNOSIS — M85851 Other specified disorders of bone density and structure, right thigh: Secondary | ICD-10-CM | POA: Diagnosis not present

## 2019-09-18 DIAGNOSIS — Z780 Asymptomatic menopausal state: Secondary | ICD-10-CM | POA: Diagnosis not present

## 2019-09-18 DIAGNOSIS — M8580 Other specified disorders of bone density and structure, unspecified site: Secondary | ICD-10-CM

## 2019-09-19 ENCOUNTER — Other Ambulatory Visit: Payer: Self-pay | Admitting: *Deleted

## 2019-09-19 MED ORDER — EZETIMIBE 10 MG PO TABS: 10.0000 mg | ORAL_TABLET | Freq: Every day | ORAL | 1 refills | Status: DC

## 2019-09-19 NOTE — Telephone Encounter (Signed)
Patient requested Rx to be sent to [facility].  

## 2019-10-22 ENCOUNTER — Other Ambulatory Visit: Payer: Self-pay

## 2019-10-22 ENCOUNTER — Other Ambulatory Visit: Payer: Medicare Other

## 2019-10-22 DIAGNOSIS — E785 Hyperlipidemia, unspecified: Secondary | ICD-10-CM

## 2019-10-22 DIAGNOSIS — I10 Essential (primary) hypertension: Secondary | ICD-10-CM

## 2019-10-22 DIAGNOSIS — E119 Type 2 diabetes mellitus without complications: Secondary | ICD-10-CM

## 2019-10-23 LAB — CBC WITH DIFFERENTIAL/PLATELET
Absolute Monocytes: 499 cells/uL (ref 200–950)
Basophils Absolute: 21 cells/uL (ref 0–200)
Basophils Relative: 0.4 %
Eosinophils Absolute: 182 cells/uL (ref 15–500)
Eosinophils Relative: 3.5 %
HCT: 41.7 % (ref 35.0–45.0)
Hemoglobin: 13.5 g/dL (ref 11.7–15.5)
Lymphs Abs: 2392 cells/uL (ref 850–3900)
MCH: 25.5 pg — ABNORMAL LOW (ref 27.0–33.0)
MCHC: 32.4 g/dL (ref 32.0–36.0)
MCV: 78.7 fL — ABNORMAL LOW (ref 80.0–100.0)
MPV: 12.2 fL (ref 7.5–12.5)
Monocytes Relative: 9.6 %
Neutro Abs: 2106 cells/uL (ref 1500–7800)
Neutrophils Relative %: 40.5 %
Platelets: 187 10*3/uL (ref 140–400)
RBC: 5.3 10*6/uL — ABNORMAL HIGH (ref 3.80–5.10)
RDW: 16 % — ABNORMAL HIGH (ref 11.0–15.0)
Total Lymphocyte: 46 %
WBC: 5.2 10*3/uL (ref 3.8–10.8)

## 2019-10-23 LAB — COMPLETE METABOLIC PANEL WITH GFR
AG Ratio: 1.3 (calc) (ref 1.0–2.5)
ALT: 14 U/L (ref 6–29)
AST: 12 U/L (ref 10–35)
Albumin: 4.1 g/dL (ref 3.6–5.1)
Alkaline phosphatase (APISO): 64 U/L (ref 37–153)
BUN: 13 mg/dL (ref 7–25)
CO2: 28 mmol/L (ref 20–32)
Calcium: 9.7 mg/dL (ref 8.6–10.4)
Chloride: 106 mmol/L (ref 98–110)
Creat: 0.7 mg/dL (ref 0.60–0.93)
GFR, Est African American: 97 mL/min/{1.73_m2} (ref >=60)
GFR, Est Non African American: 84 mL/min/{1.73_m2} (ref >=60)
Globulin: 3.1 g/dL (calc) (ref 1.9–3.7)
Glucose, Bld: 109 mg/dL — ABNORMAL HIGH (ref 65–99)
Potassium: 4.4 mmol/L (ref 3.5–5.3)
Sodium: 142 mmol/L (ref 135–146)
Total Bilirubin: 0.4 mg/dL (ref 0.2–1.2)
Total Protein: 7.2 g/dL (ref 6.1–8.1)

## 2019-10-23 LAB — LIPID PANEL
Cholesterol: 131 mg/dL (ref <200)
HDL: 53 mg/dL (ref >=50)
LDL Cholesterol (Calc): 66 mg/dL (calc)
Non-HDL Cholesterol (Calc): 78 mg/dL (calc) (ref <130)
Total CHOL/HDL Ratio: 2.5 (calc) (ref <5.0)
Triglycerides: 50 mg/dL (ref <150)

## 2019-10-23 LAB — HEMOGLOBIN A1C
Hgb A1c MFr Bld: 6.4 % of total Hgb — ABNORMAL HIGH (ref <5.7)
Mean Plasma Glucose: 137 (calc)
eAG (mmol/L): 7.6 (calc)

## 2019-10-23 LAB — MICROALBUMIN / CREATININE URINE RATIO
Creatinine, Urine: 66 mg/dL (ref 20–275)
Microalb Creat Ratio: 35 mcg/mg creat — ABNORMAL HIGH (ref <30)
Microalb, Ur: 2.3 mg/dL

## 2019-10-23 LAB — TSH: TSH: 1.67 mIU/L (ref 0.40–4.50)

## 2019-10-24 ENCOUNTER — Other Ambulatory Visit: Payer: Self-pay

## 2019-10-24 ENCOUNTER — Encounter: Payer: Self-pay | Admitting: Family

## 2019-10-24 ENCOUNTER — Ambulatory Visit (INDEPENDENT_AMBULATORY_CARE_PROVIDER_SITE_OTHER): Payer: Medicare Other | Admitting: Family

## 2019-10-24 VITALS — BP 130/84 | HR 66 | Temp 97.5°F | Ht 61.0 in | Wt 161.4 lb

## 2019-10-24 DIAGNOSIS — E785 Hyperlipidemia, unspecified: Secondary | ICD-10-CM | POA: Diagnosis not present

## 2019-10-24 DIAGNOSIS — K219 Gastro-esophageal reflux disease without esophagitis: Secondary | ICD-10-CM

## 2019-10-24 DIAGNOSIS — I10 Essential (primary) hypertension: Secondary | ICD-10-CM | POA: Diagnosis not present

## 2019-10-24 DIAGNOSIS — J45909 Unspecified asthma, uncomplicated: Secondary | ICD-10-CM | POA: Diagnosis not present

## 2019-10-24 DIAGNOSIS — E119 Type 2 diabetes mellitus without complications: Secondary | ICD-10-CM

## 2019-10-24 DIAGNOSIS — K5901 Slow transit constipation: Secondary | ICD-10-CM | POA: Diagnosis not present

## 2019-10-24 MED ORDER — LOSARTAN POTASSIUM 100 MG PO TABS: 100.0000 mg | ORAL_TABLET | Freq: Every day | ORAL | 1 refills | Status: DC

## 2019-10-24 MED ORDER — CARVEDILOL 12.5 MG PO TABS: ORAL_TABLET | ORAL | 1 refills | Status: DC

## 2019-10-24 NOTE — Progress Notes (Signed)
Provider: Marlowe Sax FNP-C   Ngetich, Nelda Bucks, NP  Patient Care Team: Ngetich, Nelda Bucks, NP as PCP - General (Family Medicine) Teena Irani, MD (Inactive) as Consulting Physician (Gastroenterology) Warden Fillers, MD as Consulting Physician (Ophthalmology) [name], MD as Consulting Physician (Ophthalmology) Mickel Fuchs, MD as Referring Physician (Psychiatry)  Extended Emergency Contact Information Primary Emergency Contact: Barnum Phone: [phone] Relation: None  Code Status:  Full Code  Goals of care: Advanced Directive information Advanced Directives 04/23/2019  Does Patient Have a Medical Advance Directive? No  Does patient want to make changes to medical advance directive? -  Would patient like information on creating a medical advance directive? Yes (MAU/Ambulatory/Procedural Areas - Information given)     Chief Complaint  Patient presents with  . Medical Management of Chronic Issues    6 month follow up visit discuss labs patient states she goes to [facility] for her right eye   . Quality Metric Gaps    eye exam and shingrix  . Medication Management    losartan wants to discuss if it comes in small pill carvedilol - [facility]     HPI:  Pt is a 78 y.o. female seen today for 6 month follow up for medical management of chronic diseases.she denies any acute issues this visit.she would like discuss labs drawn prior to visit.Lab work printed and discussed with patient. Hypertension - states does not check B/p at home need a new B/p cuff.she takes her carvedilol 12.5 mg tablet daily,losartan 100 mg tablet daily and Amlodipine 5 mg tablet daily.Also on ASA and Statin.she denies any headache,dizziness,chest pain,fatique,palpitation or shortness of breath.she continues to exercise and has modified her diet.Her weight remains stable compared to previous visit.  Type 2 DM- Home log CBG in the 90's-110's.Recent Hgb A1C 6.4  currently on metformin 500 mg tablet daily.she denies any signs of hypoglycemia.On ARB,ASA,BB and Statin.states follows up with Ophthalmology Dr.Fekrat sharon at [facility] last seen 08/09/2019 though patient thinks she was seen 08/2019.Has upcoming appointment in December,2020. She is due for podiatrist for annual foot exam.she would like referral to podiatrist around [place] where the husband is familiar with driving.  Hyperlipidemia - LDL reviewed 66 current on Krill oil 500 mg caps 2-3 times per week,Zetia 10 mg tablet daily and rosuvastatin 20 mg tablet daily.She has tried to modify her diet to include fruits and vegetables and exercise. She had bradycardia on previous visit and I had decreased her coreg to current dose.she states feeling much better after adjustment of coreg.HR has improved this visit.      Constipation- No longer taking Linzess.on magnesium oxide 500 mg caps two daily at bedtime and Metamucil as needed.states this has been effective.Includes fiber in her diet.  Seasonal Allergies - continues to have congestion worst in the mornings.takes Allegra 180 mg tablet as needed and Flonase 50 mcg nasal spray.she states uses Flonase as needed instead of daily.discuss with her to use Flonase daily.   Osteopenia - Dexa scan done 09/18/2019 right femur neck T-score -2.1 calcium-Vit D 500-200 mg- unit twice daily initiated.   Asthma - Has not required her rescue inhaler.she also uses her Symbicort as needed though advised to use twice daily.she doesn't think she needs it scheduled.   GERD - no longer taking Protonix prefers to use terms as needed.Has been able to eliminate foods that worst her symptoms.    Past Medical History:  Diagnosis Date  . Acute upper  respiratory infections of unspecified site   . Acute upper respiratory infections of unspecified site   . Acute upper respiratory infections of unspecified site   . Allergic rhinitis due to pollen   . Candidiasis of mouth    . Disorder of bone and cartilage, unspecified   . Diverticulitis of colon (without mention of hemorrhage)(562.11)   . Essential hypertension, benign   . Essential hypertension, benign   . Extrinsic asthma, unspecified   . Herpes simplex with other ophthalmic complications   . Other abnormal glucose   . Other and unspecified hyperlipidemia   . Other and unspecified hyperlipidemia   . Other and unspecified hyperlipidemia   . Other cataract   . Other cataract   . Other dystrophy of vulva   . Other malaise and fatigue   . Rash and other nonspecific skin eruption   . Reflux esophagitis   . Thrombocytopenia, unspecified ([place])   . Type II or unspecified type diabetes mellitus without mention of complication, not stated as uncontrolled   . Type II or unspecified type diabetes mellitus without mention of complication, not stated as uncontrolled   . Unspecified disorder of skin and subcutaneous tissue   . Unspecified essential hypertension   . Unspecified essential hypertension    Past Surgical History:  Procedure Laterality Date  . ABDOMINAL HYSTERECTOMY    . CESAREAN SECTION     [phone]  . CYST EXCISION  1974   Pilonidal Cyst excision  . EYE SURGERY Right 04/14/2017   Dr. Zadie Rhine  . KNEE ARTHROSCOPY Right    for meniscal tear  . RETINAL TEAR REPAIR CRYOTHERAPY  10/21/2017   Getting injections in right eye    Allergies  Allergen Reactions  . Crestor [Rosuvastatin Calcium]     Muscle cramps  . Latex Other (See Comments)    GLOVES  . Sulfa Antibiotics     Allergies as of 10/24/2019      Reactions   Crestor [rosuvastatin Calcium]    Muscle cramps   Latex Other (See Comments)   GLOVES   Sulfa Antibiotics       Medication List       Accurate as of October 24, 2019 11:11 AM. If you have any questions, ask your nurse or doctor.        STOP taking these medications   Linzess 72 MCG capsule Generic drug: linaclotide Stopped by: Sandrea Hughs, NP      TAKE these medications   acetaminophen 325 MG tablet Commonly known as: TYLENOL Take 325 mg by mouth daily as needed. For pain   albuterol 108 (90 Base) MCG/ACT inhaler Commonly known as: Ventolin HFA Inhale 2 puffs into the lungs every 6 (six) hours as needed for wheezing or shortness of breath.   AMBULATORY NON FORMULARY MEDICATION Compression stockings: to both legs when out of bed, please remove before going to bed at night 1 pair   amLODipine 5 MG tablet Commonly known as: NORVASC Take 1 tablet (5 mg total) by mouth daily.   aspirin 81 MG tablet 81 mg. Take 2-3 times weekly   budesonide-formoterol 160-4.5 MCG/ACT inhaler Commonly known as: Symbicort USE 2 INHALATIONS TWICE DAILY What changed:   when to take this  reasons to take this   calcium-vitamin D 500-200 MG-UNIT tablet Commonly known as: OSCAL WITH D Take 1 tablet by mouth 2 (two) times daily.   carvedilol 12.5 MG tablet Commonly known as: COREG TAKE 1 TABLET BY MOUTH EVERY DAY FOR BLOOD PRESSURE  ELDERBERRY PO Take by mouth. 2-3 time weekly   ezetimibe 10 MG tablet Commonly known as: ZETIA Take 1 tablet (10 mg total) by mouth daily.   fexofenadine 180 MG tablet Commonly known as: ALLEGRA Take 180 mg by mouth as needed.   fluticasone 50 MCG/ACT nasal spray Commonly known as: FLONASE Place 2 sprays into both nostrils daily.   KETOROLAC TROMETHAMINE OP Apply 1 drop to eye 4 (four) times daily. Right eye   losartan 100 MG tablet Commonly known as: Cozaar Take 1 tablet (100 mg total) by mouth daily.   Magnesium Oxide 500 MG Caps Take 2 capsules by mouth at bedtime.   metFORMIN 500 MG tablet Commonly known as: GLUCOPHAGE Take 1 tablet (500 mg total) by mouth daily with breakfast.   multivitamin tablet Take 1 tablet by mouth daily.   Omega-3 500 MG Caps Take by mouth. 2-3 time weekly   pantoprazole 40 MG tablet Commonly known as: PROTONIX Take 40 mg by mouth daily before breakfast.    prednisoLONE acetate 1 % ophthalmic suspension Commonly known as: PRED FORTE Place 1 drop into the right eye 4 (four) times daily.   rosuvastatin 20 MG tablet Commonly known as: CRESTOR Take one tablet three times a week, Monday, Wednesday and Friday   vitamin C 500 MG tablet Commonly known as: ASCORBIC ACID Take 500 mg by mouth daily.       Review of Systems  Constitutional: Negative for appetite change, chills, fatigue, fever and unexpected weight change.  HENT: Positive for congestion. Negative for hearing loss, sinus pressure, sinus pain, sneezing, sore throat and trouble swallowing.        Runny nose worst in the mornings.  Eyes: Positive for visual disturbance. Negative for pain, discharge, redness and itching.       Follows up with Ophthalmology   Respiratory: Negative for cough, chest tightness, shortness of breath and wheezing.   Cardiovascular: Negative for chest pain, palpitations and leg swelling.  Gastrointestinal: Negative for abdominal pain, constipation, diarrhea, nausea and vomiting.       GERD symptoms under control  Endocrine: Negative for cold intolerance, heat intolerance, polydipsia, polyphagia and polyuria.  Genitourinary: Negative for decreased urine volume, difficulty urinating, dysuria, flank pain, frequency and urgency.  Musculoskeletal: Negative for arthralgias, gait problem and neck stiffness.  Skin: Negative for color change, pallor, rash and wound.  Neurological: Negative for dizziness, speech difficulty, weakness, light-headedness, numbness and headaches.  Hematological: Does not bruise/bleed easily.  Psychiatric/Behavioral: Negative for agitation, confusion and sleep disturbance. The patient is not nervous/anxious.     Immunization History  Administered Date(s) Administered  . Fluad Quad(high Dose 65+) 08/29/2019  . Influenza Split 08/25/2010, 08/26/2011, 10/04/2012  . Influenza Whole 09/24/2009  . Influenza, High Dose Seasonal PF 08/16/2018   . Influenza,inj,Quad PF,6+ Mos 09/11/2013, 09/04/2014, 11/12/2015, 10/04/2016, 09/06/2017  . Influenza-Unspecified 09/02/2018  . Pneumococcal Conjugate-13 02/17/2012  . Pneumococcal Polysaccharide-23 05/19/2016  . Tdap 02/17/2012   Pertinent  Health Maintenance Due  Topic Date Due  . OPHTHALMOLOGY EXAM  04/07/2019  . FOOT EXAM  10/25/2019  . HEMOGLOBIN A1C  04/20/2020  . INFLUENZA VACCINE  Completed  . DEXA SCAN  Completed  . PNA vac Low Risk Adult  Completed   Fall Risk  10/24/2019 07/17/2019 04/23/2019 01/02/2019 10/24/2018  Falls in the past year? 0 0 0 0 0  Number falls in past yr: 0 0 0 0 -  Injury with Fall? 0 0 0 0 -    Vitals:  10/24/19 1045  BP: 130/84  Pulse: 66  Temp: (!) 97.5 F (36.4 C)  TempSrc: Temporal  SpO2: 95%  Weight: 161 lb 6.4 oz (73.2 kg)  Height: 5' 1"  (1.549 m)   Body mass index is 30.5 kg/m. Physical Exam Vitals signs reviewed.  Constitutional:      General: She is not in acute distress.    Appearance: She is obese. She is not ill-appearing.  HENT:     Head: Normocephalic.     Right Ear: Tympanic membrane, ear canal and external ear normal. There is no impacted cerumen.     Left Ear: Tympanic membrane, ear canal and external ear normal. There is no impacted cerumen.     Nose: Congestion present. No nasal tenderness or rhinorrhea.     Right Nostril: No epistaxis.     Left Nostril: No epistaxis.     Right Turbinates: Swollen. Not pale.     Left Turbinates: Swollen. Not pale.     Right Sinus: No maxillary sinus tenderness or frontal sinus tenderness.     Left Sinus: No maxillary sinus tenderness or frontal sinus tenderness.     Mouth/Throat:     Mouth: Mucous membranes are moist.     Pharynx: Oropharynx is clear. No oropharyngeal exudate or posterior oropharyngeal erythema.  Eyes:     General: No scleral icterus.       Right eye: No discharge.        Left eye: No discharge.     Extraocular Movements: Extraocular movements intact.      Conjunctiva/sclera: Conjunctivae normal.     Pupils: Pupils are equal, round, and reactive to light.     Comments: Corrective lens in place   Neck:     Musculoskeletal: Normal range of motion. No neck rigidity or muscular tenderness.     Vascular: No carotid bruit.  Cardiovascular:     Rate and Rhythm: Normal rate.     Pulses: Normal pulses.     Heart sounds: Normal heart sounds. No murmur. No friction rub. No gallop.   Pulmonary:     Effort: Pulmonary effort is normal. No respiratory distress.     Breath sounds: Normal breath sounds. No wheezing, rhonchi or rales.  Chest:     Chest wall: No tenderness.  Abdominal:     General: Bowel sounds are normal. There is no distension.     Palpations: Abdomen is soft. There is no mass.     Tenderness: There is no abdominal tenderness. There is no right CVA tenderness, left CVA tenderness, guarding or rebound.  Musculoskeletal: Normal range of motion.        General: No swelling or tenderness.     Right lower leg: No edema.     Left lower leg: No edema.  Lymphadenopathy:     Cervical: No cervical adenopathy.  Skin:    General: Skin is warm and dry.     Coloration: Skin is not pale.     Findings: No bruising, erythema, lesion or rash.  Neurological:     Mental Status: She is alert and oriented to person, place, and time.     Cranial Nerves: No cranial nerve deficit.     Sensory: No sensory deficit.     Motor: No weakness.     Coordination: Coordination normal.     Gait: Gait normal.  Psychiatric:        Mood and Affect: Mood normal.        Behavior: Behavior normal.  Thought Content: Thought content normal.        Judgment: Judgment normal.    Labs reviewed: Recent Labs    04/19/19 0833 10/22/19 0929  NA 141 142  K 4.5 4.4  CL 106 106  CO2 30 28  GLUCOSE 115* 109*  BUN 15 13  CREATININE 0.69 0.70  CALCIUM 9.4 9.7   Recent Labs    04/19/19 0833 10/22/19 0929  AST 13 12  ALT 14 14  BILITOT 0.4 0.4  PROT 7.1 7.2    Recent Labs    04/19/19 0833 10/22/19 0929  WBC 6.4 5.2  NEUTROABS 2,374 2,106  HGB 13.5 13.5  HCT 40.9 41.7  MCV 79.1* 78.7*  PLT 179 187   Lab Results  Component Value Date   TSH 1.67 10/22/2019   Lab Results  Component Value Date   HGBA1C 6.4 (H) 10/22/2019   Lab Results  Component Value Date   CHOL 131 10/22/2019   HDL 53 10/22/2019   LDLCALC 66 10/22/2019   TRIG 50 10/22/2019   CHOLHDL 2.5 10/22/2019    Significant Diagnostic Results in last 30 days:  No results found.  Assessment/Plan 1. Essential hypertension B/p at goal.Continue on carvedilol 12.5 mg tablet daily,losartan 100 mg tablet daily and Amlodipine 5 mg tablet daily.Also on ASA and Statin.continue with dietary and lifestyle modification.  - carvedilol (COREG) 12.5 MG tablet; TAKE 1 TABLET BY MOUTH EVERY DAY Dispense: 90 tablet; Refill: 1 - CBC with Differential/Platelet; Future - CMP with eGFR([facility]); Future  2. Controlled type 2 diabetes mellitus without complication, without long-term current use of insulin (HCC) CBG controlled.continue on metformin 500 mg tablet daily.continue on ARB,ASA,BB and Statin.Follows up with Ophthalmology Dr.Fekrat sharon at [facility] as directed. - TSH; Future - Hemoglobin A1c; Future - Ambulatory referral to Podiatry  3. Hyperlipidemia LDL goal <100 LDL at goal.continue on on Krill oil 500 mg caps 2-3 times per week,Zetia 10 mg tablet daily and rosuvastatin 20 mg tablet daily.continue with dietary and lifestyle  Modification. - Lipid panel; Future  4. Extrinsic asthma, unspecified asthma severity, uncomplicated Symptoms under control.continue on Allegra,Albuterol and Symbicort.   5. Gastroesophageal reflux disease without esophagitis Symptoms control.continue on Tums as needed and avoid aggravating foods.  - CBC with Differential/Platelet; Future  6. Slow transit constipation Metamucil and Magnesium oxide effective.continue with fiber in diet and  hydration.   Family/ staff Communication: Reviewed plan of care with patient.  Labs/tests ordered:  - CBC with Differential/Platelet; Future - CMP with eGFR([facility]); Future - TSH; Future - Hemoglobin A1c; Future - Lipid panel; Future  Sandrea Hughs, NP

## 2019-10-29 ENCOUNTER — Encounter: Payer: Self-pay | Admitting: Family

## 2019-10-29 ENCOUNTER — Ambulatory Visit (INDEPENDENT_AMBULATORY_CARE_PROVIDER_SITE_OTHER): Payer: Medicare Other | Admitting: Family

## 2019-10-29 ENCOUNTER — Other Ambulatory Visit: Payer: Self-pay

## 2019-10-29 VITALS — BP 128/74 | HR 63 | Temp 97.3°F | Ht 61.0 in | Wt 160.4 lb

## 2019-10-29 DIAGNOSIS — K649 Unspecified hemorrhoids: Secondary | ICD-10-CM

## 2019-10-29 DIAGNOSIS — R143 Flatulence: Secondary | ICD-10-CM

## 2019-10-29 DIAGNOSIS — R399 Unspecified symptoms and signs involving the genitourinary system: Secondary | ICD-10-CM

## 2019-10-29 DIAGNOSIS — K5901 Slow transit constipation: Secondary | ICD-10-CM | POA: Diagnosis not present

## 2019-10-29 LAB — POCT URINALYSIS DIPSTICK
Bilirubin, UA: NEGATIVE
Blood, UA: NEGATIVE
Glucose, UA: NEGATIVE
Ketones, UA: NEGATIVE
Leukocytes, UA: NEGATIVE
Nitrite, UA: NEGATIVE
Protein, UA: NEGATIVE
Spec Grav, UA: 1.015 (ref 1.010–1.025)
Urobilinogen, UA: 0.2 E.U./dL
pH, UA: 6.5 (ref 5.0–8.0)

## 2019-10-29 MED ORDER — PANTOPRAZOLE SODIUM 40 MG PO TBEC: 40.0000 mg | DELAYED_RELEASE_TABLET | Freq: Every day | ORAL | 5 refills | Status: DC

## 2019-10-29 MED ORDER — PHENYLEPH-SHARK LIV OIL-MO-PET 0.25-3-14-71.9 % RE OINT: 1.0000 "application " | TOPICAL_OINTMENT | Freq: Two times a day (BID) | RECTAL | 0 refills | Status: DC | PRN

## 2019-10-29 MED ORDER — SIMETHICONE 80 MG PO CHEW: 80.0000 mg | CHEWABLE_TABLET | Freq: Four times a day (QID) | ORAL | 0 refills | Status: AC | PRN

## 2019-10-29 NOTE — Patient Instructions (Signed)
Increase fluid intake.

## 2019-10-29 NOTE — Progress Notes (Addendum)
Provider: Marlowe Sax FNP-C  Percell Lamboy, Nelda Bucks, NP  Patient Care Team: Venida Tsukamoto, Nelda Bucks, NP as PCP - General (Family Medicine) Teena Irani, MD (Inactive) as Consulting Physician (Gastroenterology) Warden Fillers, MD as Consulting Physician (Ophthalmology) [name], MD as Consulting Physician (Ophthalmology) Mickel Fuchs, MD as Referring Physician (Psychiatry)  Extended Emergency Contact Information Primary Emergency Contact: [place] Phone: [phone] Relation: None  Code Status: Full Code  Goals of care: Advanced Directive information Advanced Directives 04/23/2019  Does Patient Have a Medical Advance Directive? No  Does patient want to make changes to medical advance directive? -  Would patient like information on creating a medical advance directive? Yes (MAU/Ambulatory/Procedural Areas - Information given)     Chief Complaint  Patient presents with  . Acute Visit    Complains of UTI patient states she started having symptoms last wednesday patient denies any pain when urinating and states frequency but she drinks lots of water. Patient states she has some abdominal pain along with constipation.   . Medication Management    patient would like to discuss flatulence and what to take    HPI:  Pt is a 78 y.o. female seen today for an acute visit for evaluation of symptoms of urinary tract infection x 6 days.she states having some bilateral lower abdominal abdomen and  back pain.she denies any fever,chills,urine urgency,frequency or dysuria. She complains of constipation.Had last bowel movement yesterday but was hard felt like caused some spasm and hemorrhoids.she used Preparation H suppository.she states not taking her metamucil as directed to take twice daily by her GI specialist.she has been busy caring for her Husband and forgets to take her metamucil.she does not drink enough water either. She was previously prescribed Linzes by GI specialist but stopped  using states gave her diarrhea. She would also like some gas-X states has had increased a lot of gas.    Past Medical History:  Diagnosis Date  . Acute upper respiratory infections of unspecified site   . Acute upper respiratory infections of unspecified site   . Acute upper respiratory infections of unspecified site   . Allergic rhinitis due to pollen   . Candidiasis of mouth   . Disorder of bone and cartilage, unspecified   . Diverticulitis of colon (without mention of hemorrhage)(562.11)   . Essential hypertension, benign   . Essential hypertension, benign   . Extrinsic asthma, unspecified   . Herpes simplex with other ophthalmic complications   . Other abnormal glucose   . Other and unspecified hyperlipidemia   . Other and unspecified hyperlipidemia   . Other and unspecified hyperlipidemia   . Other cataract   . Other cataract   . Other dystrophy of vulva   . Other malaise and fatigue   . Rash and other nonspecific skin eruption   . Reflux esophagitis   . Thrombocytopenia, unspecified ([place])   . Type II or unspecified type diabetes mellitus without mention of complication, not stated as uncontrolled   . Type II or unspecified type diabetes mellitus without mention of complication, not stated as uncontrolled   . Unspecified disorder of skin and subcutaneous tissue   . Unspecified essential hypertension   . Unspecified essential hypertension    Past Surgical History:  Procedure Laterality Date  . ABDOMINAL HYSTERECTOMY    . CESAREAN SECTION     [phone]  . CYST EXCISION  1974   Pilonidal Cyst excision  . EYE SURGERY Right 04/14/2017   Dr. Zadie Rhine  . KNEE ARTHROSCOPY Right  for meniscal tear  . RETINAL TEAR REPAIR CRYOTHERAPY  10/21/2017   Getting injections in right eye    Allergies  Allergen Reactions  . Crestor [Rosuvastatin Calcium]     Muscle cramps  . Latex Other (See Comments)    GLOVES  . Sulfa Antibiotics     Outpatient Encounter  Medications as of 10/29/2019  Medication Sig  . acetaminophen (TYLENOL) 325 MG tablet Take 325 mg by mouth daily as needed. For pain  . albuterol (VENTOLIN HFA) 108 (90 Base) MCG/ACT inhaler Inhale 2 puffs into the lungs every 6 (six) hours as needed for wheezing or shortness of breath.  . AMBULATORY NON FORMULARY MEDICATION Compression stockings: to both legs when out of bed, please remove before going to bed at night 1 pair  . amLODipine (NORVASC) 5 MG tablet Take 1 tablet (5 mg total) by mouth daily.  Marland Kitchen aspirin 81 MG tablet 81 mg. Take 2-3 times weekly  . budesonide-formoterol (SYMBICORT) 160-4.5 MCG/ACT inhaler USE 2 INHALATIONS TWICE DAILY (Patient taking differently: as needed. USE 2 INHALATIONS TWICE DAILY)  . calcium-vitamin D (OSCAL WITH D) 500-200 MG-UNIT per tablet Take 1 tablet by mouth 2 (two) times daily.  . carvedilol (COREG) 12.5 MG tablet TAKE 1 TABLET BY MOUTH EVERY DAY FOR BLOOD PRESSURE  . ELDERBERRY PO Take by mouth. 2-3 time weekly  . ezetimibe (ZETIA) 10 MG tablet Take 1 tablet (10 mg total) by mouth daily.  . fexofenadine (ALLEGRA) 180 MG tablet Take 180 mg by mouth as needed.   . fluticasone (FLONASE) 50 MCG/ACT nasal spray Place 2 sprays into both nostrils daily.  Marland Kitchen KETOROLAC TROMETHAMINE OP Apply 1 drop to eye 4 (four) times daily. Right eye  . Krill Oil (OMEGA-3) 500 MG CAPS Take by mouth. 2-3 time weekly  . losartan (COZAAR) 100 MG tablet Take 1 tablet (100 mg total) by mouth daily.  . Magnesium Oxide 500 MG CAPS Take 2 capsules by mouth at bedtime.  . metFORMIN (GLUCOPHAGE) 500 MG tablet Take 1 tablet (500 mg total) by mouth daily with breakfast.  . Multiple Vitamin (MULTIVITAMIN) tablet Take 1 tablet by mouth daily.  . pantoprazole (PROTONIX) 40 MG tablet Take 1 tablet (40 mg total) by mouth daily before breakfast.  . prednisoLONE acetate (PRED FORTE) 1 % ophthalmic suspension Place 1 drop into the right eye 4 (four) times daily.   . psyllium (REGULOID) 0.52 g  capsule Take 0.52 g by mouth daily.  . rosuvastatin (CRESTOR) 20 MG tablet Take one tablet three times a week, Monday, Wednesday and Friday  . vitamin C (ASCORBIC ACID) 500 MG tablet Take 500 mg by mouth daily.  . [DISCONTINUED] pantoprazole (PROTONIX) 40 MG tablet Take 40 mg by mouth daily before breakfast.   No facility-administered encounter medications on file as of 10/29/2019.     Review of Systems  Constitutional: Negative for appetite change, chills, fatigue and fever.  Eyes: Negative for discharge, redness and itching.  Respiratory: Negative for cough, chest tightness, shortness of breath and wheezing.   Cardiovascular: Negative for chest pain, palpitations and leg swelling.  Gastrointestinal: Positive for constipation. Negative for abdominal distention, diarrhea, nausea and vomiting.       Lower abdominal pain   Genitourinary: Negative for difficulty urinating, dysuria, flank pain, frequency and urgency.  Musculoskeletal: Negative for arthralgias and gait problem.  Skin: Negative for color change, pallor and rash.  Psychiatric/Behavioral: Negative for agitation and sleep disturbance. The patient is not nervous/anxious.     Immunization History  Administered Date(s) Administered  . Fluad Quad(high Dose 65+) 08/29/2019  . Influenza Split 08/25/2010, 08/26/2011, 10/04/2012  . Influenza Whole 09/24/2009  . Influenza, High Dose Seasonal PF 08/16/2018  . Influenza,inj,Quad PF,6+ Mos 09/11/2013, 09/04/2014, 11/12/2015, 10/04/2016, 09/06/2017  . Influenza-Unspecified 09/02/2018  . Pneumococcal Conjugate-13 02/17/2012  . Pneumococcal Polysaccharide-23 05/19/2016  . Tdap 02/17/2012   Pertinent  Health Maintenance Due  Topic Date Due  . FOOT EXAM  10/25/2019  . HEMOGLOBIN A1C  04/20/2020  . OPHTHALMOLOGY EXAM  08/14/2020  . INFLUENZA VACCINE  Completed  . DEXA SCAN  Completed  . PNA vac Low Risk Adult  Completed   Fall Risk  10/29/2019 10/24/2019 07/17/2019 04/23/2019 01/02/2019   Falls in the past year? 0 0 0 0 0  Number falls in past yr: 0 0 0 0 0  Injury with Fall? 0 0 0 0 0    Vitals:   10/29/19 1027  BP: 128/74  Pulse: 63  Temp: (!) 97.3 F (36.3 C)  TempSrc: Temporal  SpO2: 93%  Weight: 160 lb 6.4 oz (72.8 kg)  Height: 5\' 1"  (1.549 m)   Body mass index is 30.31 kg/m. Physical Exam Constitutional:      General: She is not in acute distress.    Appearance: She is obese. She is not ill-appearing.  Eyes:     General: No scleral icterus.       Right eye: No discharge.        Left eye: No discharge.     Conjunctiva/sclera: Conjunctivae normal.     Pupils: Pupils are equal, round, and reactive to light.  Cardiovascular:     Rate and Rhythm: Normal rate and regular rhythm.     Pulses: Normal pulses.     Heart sounds: Normal heart sounds. No murmur. No friction rub. No gallop.   Pulmonary:     Effort: Pulmonary effort is normal.     Breath sounds: Normal breath sounds. No wheezing, rhonchi or rales.  Chest:     Chest wall: No tenderness.  Abdominal:     General: Bowel sounds are normal. There is no distension.     Palpations: Abdomen is soft. There is no mass.     Tenderness: There is no abdominal tenderness. There is no right CVA tenderness, left CVA tenderness, guarding or rebound.  Genitourinary:    Rectum: Internal hemorrhoid present. No mass or tenderness. Normal anal tone.  Musculoskeletal: Normal range of motion.        General: No swelling or tenderness.     Right lower leg: No edema.     Left lower leg: No edema.  Skin:    General: Skin is warm.     Coloration: Skin is not pale.     Findings: No bruising, erythema or rash.  Neurological:     Mental Status: She is alert and oriented to person, place, and time.     Cranial Nerves: No cranial nerve deficit.     Motor: No weakness.     Gait: Gait normal.  Psychiatric:        Mood and Affect: Mood normal.        Behavior: Behavior normal.        Thought Content: Thought content  normal.        Judgment: Judgment normal.    Labs reviewed: Recent Labs    04/19/19 0833 10/22/19 0929  NA 141 142  K 4.5 4.4  CL 106 106  CO2 30 28  GLUCOSE 115*  109*  BUN 15 13  CREATININE 0.69 0.70  CALCIUM 9.4 9.7   Recent Labs    04/19/19 0833 10/22/19 0929  AST 13 12  ALT 14 14  BILITOT 0.4 0.4  PROT 7.1 7.2   Recent Labs    04/19/19 0833 10/22/19 0929  WBC 6.4 5.2  NEUTROABS 2,374 2,106  HGB 13.5 13.5  HCT 40.9 41.7  MCV 79.1* 78.7*  PLT 179 187   Lab Results  Component Value Date   TSH 1.67 10/22/2019   Lab Results  Component Value Date   HGBA1C 6.4 (H) 10/22/2019   Lab Results  Component Value Date   CHOL 131 10/22/2019   HDL 53 10/22/2019   LDLCALC 66 10/22/2019   TRIG 50 10/22/2019   CHOLHDL 2.5 10/22/2019    Significant Diagnostic Results in last 30 days:  No results found.  Assessment/Plan  1. UTI symptoms Afebrile.reports lower abdominal and back pain.No symptoms of urine frequency,urgecy or dysuria. - POC Urinalysis Dipstick indicates clear yellow urine,negative for Nitrites,bacteria and Leukocytes. Encouraged to increase fluid intake.suspect her lower abdominal/back pain could be due to constipation.   2. Slow transit constipation Forgets to take her metamucil as instructed by GI specialist.Discussed with patient to take metamucil twice daily as directed.   3. Flatulence - simethicone (GAS-X) 80 MG chewable tablet; Chew 1 tablet (80 mg total) by mouth every 6 (six) hours as needed for flatulence.  Dispense: 30 tablet; Refill: 0  4. Hemorrhoids, unspecified hemorrhoid type Internal hemorrhoids noted.constipation contributing to her symptoms.Encouraged to take metamucil  Twice daily then notify provider if still has any symptoms.Declined linzess as prescribed by GI specialist.will refer her to GI if needed.  - phenylephrine-shark liver oil-mineral oil-petrolatum (PREPARATION H) 0.25-3-14-71.9 % rectal ointment; Place 1 application  rectally 2 (two) times daily as needed for hemorrhoids.  Dispense: 30 g; Refill: 0  Family/ staff Communication: Reviewed plan of care with patient.   Labs/tests ordered: None   Rohan Juenger C Nayan Proch, NP

## 2019-11-13 ENCOUNTER — Other Ambulatory Visit: Payer: Self-pay

## 2019-11-20 ENCOUNTER — Other Ambulatory Visit: Payer: Self-pay | Admitting: *Deleted

## 2019-11-20 MED ORDER — FLUTICASONE PROPIONATE 50 MCG/ACT NA SUSP: 2.0000 | Freq: Every day | NASAL | 6 refills | Status: DC

## 2019-11-20 NOTE — Telephone Encounter (Signed)
Patient requested refill

## 2019-11-21 ENCOUNTER — Other Ambulatory Visit: Payer: Self-pay

## 2019-11-21 MED ORDER — MOMETASONE FUROATE 50 MCG/ACT NA SUSP: 1.0000 | Freq: Two times a day (BID) | NASAL | 0 refills | Status: DC | PRN

## 2019-11-21 NOTE — Telephone Encounter (Signed)
Patient called and requested refill for mometasone nasonex nasal spray. Patient did have flonase on medication list but said she did not use and had problems with it in the past. She instead uses above nasal spray and [facility] a 1 time refill.   [facility] and canceled current order of flonase and updated patient's medication list.   Routing to provider for approval due to high allergy alert warning.

## 2019-11-26 ENCOUNTER — Ambulatory Visit
Admission: RE | Admit: 2019-11-26 | Discharge: 2019-11-26 | Disposition: A | Discharge status: Home / Self Care | Payer: Medicare Other | Source: Ambulatory Visit | Attending: Family | Admitting: Family

## 2019-11-26 ENCOUNTER — Other Ambulatory Visit: Payer: Self-pay

## 2019-11-26 DIAGNOSIS — Z1231 Encounter for screening mammogram for malignant neoplasm of breast: Secondary | ICD-10-CM

## 2020-01-04 ENCOUNTER — Other Ambulatory Visit: Payer: Self-pay

## 2020-01-04 ENCOUNTER — Ambulatory Visit (INDEPENDENT_AMBULATORY_CARE_PROVIDER_SITE_OTHER): Payer: Medicare Other | Admitting: Family

## 2020-01-04 ENCOUNTER — Encounter: Payer: Self-pay | Admitting: Family

## 2020-01-04 VITALS — BP 138/70 | HR 59 | Temp 97.5°F | Ht 61.0 in | Wt 162.0 lb

## 2020-01-04 DIAGNOSIS — Z0000 Encounter for general adult medical examination without abnormal findings: Secondary | ICD-10-CM

## 2020-01-04 NOTE — Progress Notes (Signed)
Subjective:   Tanya Bell is a 79 y.o. female who presents for Medicare Annual (Subsequent) preventive examination.  Review of Systems:   Cardiac Risk Factors include: advanced age (>30men, >80 women);diabetes mellitus;hypertension;smoking/ tobacco exposure;dyslipidemia;obesity (BMI >30kg/m2);sedentary lifestyle     Objective:     Vitals: BP 138/70   Pulse (!) 59   Temp (!) 97.5 F (36.4 C) (Tympanic)   Ht 5\' 1"  (1.549 m)   Wt 162 lb (73.5 kg)   SpO2 95%   BMI 30.61 kg/m   Body mass index is 30.61 kg/m.  Advanced Directives 01/04/2020 04/23/2019 01/02/2019 01/02/2019 01/02/2019 10/24/2018 04/21/2018  Does Patient Have a Medical Advance Directive? No No No No No No No  Does patient want to make changes to medical advance directive? - - - - - - Yes (MAU/Ambulatory/Procedural Areas - Information given)  Would patient like information on creating a medical advance directive? No - Patient declined Yes (MAU/Ambulatory/Procedural Areas - Information given) No - Patient declined No - Patient declined No - Patient declined - -    Tobacco Social History   Tobacco Use  Smoking Status Former Smoker  . Types: Cigarettes  . Quit date: 12/13/1990  . Years since quitting: 29.0  Smokeless Tobacco Never Used     Counseling given: Not Answered   Clinical Intake:  Pre-visit preparation completed: No  Pain : No/denies pain     BMI - recorded: 30.61 Nutritional Status: BMI > 30  Obese Nutritional Risks: None Diabetes: Yes CBG done?: No Did pt. bring in CBG monitor from home?: No(recall readings 110's- 130's)  How often do you need to have someone help you when you read instructions, pamphlets, or other written materials from your doctor or pharmacy?: 1 - Never What is the last grade level you completed in school?: GED/medical Secretary  Interpreter Needed?: No  Information entered by :: Jaeveon Ashland FNP-C  Past Medical History:  Diagnosis Date  . Acute upper  respiratory infections of unspecified site   . Acute upper respiratory infections of unspecified site   . Acute upper respiratory infections of unspecified site   . Allergic rhinitis due to pollen   . Candidiasis of mouth   . Disorder of bone and cartilage, unspecified   . Diverticulitis of colon (without mention of hemorrhage)(562.11)   . Essential hypertension, benign   . Essential hypertension, benign   . Extrinsic asthma, unspecified   . Herpes simplex with other ophthalmic complications   . Other abnormal glucose   . Other and unspecified hyperlipidemia   . Other and unspecified hyperlipidemia   . Other and unspecified hyperlipidemia   . Other cataract   . Other cataract   . Other dystrophy of vulva   . Other malaise and fatigue   . Rash and other nonspecific skin eruption   . Reflux esophagitis   . Thrombocytopenia, unspecified ([place])   . Type II or unspecified type diabetes mellitus without mention of complication, not stated as uncontrolled   . Type II or unspecified type diabetes mellitus without mention of complication, not stated as uncontrolled   . Unspecified disorder of skin and subcutaneous tissue   . Unspecified essential hypertension   . Unspecified essential hypertension    Past Surgical History:  Procedure Laterality Date  . ABDOMINAL HYSTERECTOMY    . CESAREAN SECTION     [phone]  . CYST EXCISION  1974   Pilonidal Cyst excision  . EYE SURGERY Right 04/14/2017   Dr. Zadie Rhine  .  KNEE ARTHROSCOPY Right    for meniscal tear  . RETINAL TEAR REPAIR CRYOTHERAPY  10/21/2017   Getting injections in right eye   Family History  Problem Relation Age of Onset  . Cancer Mother        brain  . Stroke Father    Social History   Socioeconomic History  . Marital status: Married    Spouse name: Not on file  . Number of children: Not on file  . Years of education: Not on file  . Highest education level: Not on file  Occupational History  . Not on file   Tobacco Use  . Smoking status: Former Smoker    Types: Cigarettes    Quit date: 12/13/1990    Years since quitting: 29.0  . Smokeless tobacco: Never Used  Substance and Sexual Activity  . Alcohol use: No    Alcohol/week: 0.0 standard drinks  . Drug use: No  . Sexual activity: Yes    Partners: Male    Comment: None   Other Topics Concern  . Not on file  Social History Narrative  . Not on file   Social Determinants of Health   Financial Resource Strain:   . Difficulty of Paying Living Expenses: Not on file  Food Insecurity:   . Worried About Charity fundraiser in the Last Year: Not on file  . Ran Out of Food in the Last Year: Not on file  Transportation Needs:   . Lack of Transportation (Medical): Not on file  . Lack of Transportation (Non-Medical): Not on file  Physical Activity:   . Days of Exercise per Week: Not on file  . Minutes of Exercise per Session: Not on file  Stress:   . Feeling of Stress : Not on file  Social Connections:   . Frequency of Communication with Friends and Family: Not on file  . Frequency of Social Gatherings with Friends and Family: Not on file  . Attends Religious Services: Not on file  . Active Member of Clubs or Organizations: Not on file  . Attends Archivist Meetings: Not on file  . Marital Status: Not on file    Outpatient Encounter Medications as of 01/04/2020  Medication Sig  . acetaminophen (TYLENOL) 325 MG tablet Take 325 mg by mouth daily as needed. For pain  . albuterol (VENTOLIN HFA) 108 (90 Base) MCG/ACT inhaler Inhale 2 puffs into the lungs every 6 (six) hours as needed for wheezing or shortness of breath.  Marland Kitchen amLODipine (NORVASC) 5 MG tablet Take 1 tablet (5 mg total) by mouth daily.  Marland Kitchen aspirin 81 MG tablet 81 mg. Take 2-3 times weekly  . budesonide-formoterol (SYMBICORT) 160-4.5 MCG/ACT inhaler USE 2 INHALATIONS TWICE DAILY  . calcium-vitamin D (OSCAL WITH D) 500-200 MG-UNIT per tablet Take 1 tablet by mouth 2 (two)  times daily.  . carvedilol (COREG) 12.5 MG tablet TAKE 1 TABLET BY MOUTH EVERY DAY FOR BLOOD PRESSURE  . ELDERBERRY PO Take by mouth 3 (three) times a week.   . ezetimibe (ZETIA) 10 MG tablet Take 1 tablet (10 mg total) by mouth daily.  . fexofenadine (ALLEGRA) 180 MG tablet Take 180 mg by mouth as needed.   Marland Kitchen KETOROLAC TROMETHAMINE OP Apply 1 drop to eye 4 (four) times daily. Right eye  . Krill Oil (OMEGA-3) 500 MG CAPS Take by mouth 3 (three) times a week.   . losartan (COZAAR) 100 MG tablet Take 1 tablet (100 mg total) by mouth daily.  Marland Kitchen  Magnesium Oxide 500 MG CAPS Take 2 capsules by mouth at bedtime.  . metFORMIN (GLUCOPHAGE) 500 MG tablet Take 1 tablet (500 mg total) by mouth daily with breakfast.  . mometasone (NASONEX) 50 MCG/ACT nasal spray Place 1 spray into the nose 2 (two) times daily as needed.  . Multiple Vitamin (MULTIVITAMIN) tablet Take 1 tablet by mouth daily.  . pantoprazole (PROTONIX) 40 MG tablet Take 1 tablet (40 mg total) by mouth daily before breakfast.  . phenylephrine-shark liver oil-mineral oil-petrolatum (PREPARATION H) 0.25-3-14-71.9 % rectal ointment Place 1 application rectally 2 (two) times daily as needed for hemorrhoids.  . prednisoLONE acetate (PRED FORTE) 1 % ophthalmic suspension Place 1 drop into the right eye 4 (four) times daily.   . psyllium (REGULOID) 0.52 g capsule Take 0.52 g by mouth daily.  . rosuvastatin (CRESTOR) 20 MG tablet Take one tablet three times a week, Monday, Wednesday and Friday  . simethicone (GAS-X) 80 MG chewable tablet Chew 1 tablet (80 mg total) by mouth every 6 (six) hours as needed for flatulence.  . vitamin C (ASCORBIC ACID) 500 MG tablet Take 500 mg by mouth daily.  . [DISCONTINUED] AMBULATORY NON FORMULARY MEDICATION Compression stockings: to both legs when out of bed, please remove before going to bed at night 1 pair   No facility-administered encounter medications on file as of 01/04/2020.    Activities of Daily Living In  your present state of health, do you have any difficulty performing the following activities: 01/04/2020  Hearing? N  Vision? Y  Comment follows up with Opthalomologist  Difficulty concentrating or making decisions? N  Walking or climbing stairs? N  Dressing or bathing? N  Doing errands, shopping? N  Comment Husband Restaurant manager, fast food and eating ? N  Using the Toilet? Y  Comment sometimes constipated but Magnesiun citrate effective  In the past six months, have you accidently leaked urine? N  Do you have problems with loss of bowel control? N  Managing your Medications? N  Managing your Finances? N  Housekeeping or managing your Housekeeping? N  Some recent data might be hidden    Patient Care Team: Lorrin Bodner, Nelda Bucks, NP as PCP - General (Family Medicine) Teena Irani, MD (Inactive) as Consulting Physician (Gastroenterology) Warden Fillers, MD as Consulting Physician (Ophthalmology) [name], MD as Consulting Physician (Ophthalmology) Mickel Fuchs, MD as Referring Physician (Psychiatry)    Assessment:   This is a routine wellness examination for Ahmari.  Exercise Activities and Dietary recommendations Current Exercise Habits: The patient does not participate in regular exercise at present, Exercise limited by: None identified  Goals    . Exercise 3x per week (30 min per time)     I would like to increase my exercise twice per week  I will also like to socialize more     . Weight (lb) < 200 lb (90.7 kg)     Starting 10/29/16, I will attempt to maintain my current weight or decrease when possible.        Fall Risk Fall Risk  01/04/2020 10/29/2019 10/24/2019 07/17/2019 04/23/2019  Falls in the past year? 0 0 0 0 0  Number falls in past yr: 0 0 0 0 0  Injury with Fall? 0 0 0 0 0   Is the patient's home free of loose throw rugs in walkways, pet beds, electrical cords, etc?   no      Grab bars in the bathroom? yes      Handrails on  the stairs?   no stairs        Adequate lighting?   yes  Depression Screen PHQ 2/9 Scores 01/04/2020 04/23/2019 01/02/2019 12/16/2017  PHQ - 2 Score 0 0 0 0     Cognitive Function MMSE - Mini Mental State Exam 01/04/2020 01/02/2019 12/14/2017 10/29/2016 09/04/2014  Orientation to time 4 5 5 5 5   Orientation to Place 5 5 5 5 5   Registration 3 3 3 3 3   Attention/ Calculation 5 5 5 5 3   Recall 3 3 1 2 1   Language- name 2 objects 2 2 2 2 2   Language- repeat 1 1 1 1 1   Language- follow 3 step command 3 3 3 2 2   Language- read & follow direction 1 1 1 1 1   Write a sentence 1 1 1 1 1   Copy design 1 1 1 1 1   Total score 29 30 28 28 25         Immunization History  Administered Date(s) Administered  . Fluad Quad(high Dose 65+) 08/29/2019  . Influenza Split 08/25/2010, 08/26/2011, 10/04/2012  . Influenza Whole 09/24/2009  . Influenza, High Dose Seasonal PF 08/16/2018  . Influenza,inj,Quad PF,6+ Mos 09/11/2013, 09/04/2014, 11/12/2015, 10/04/2016, 09/06/2017  . Influenza-Unspecified 09/02/2018  . Pneumococcal Conjugate-13 02/17/2012  . Pneumococcal Polysaccharide-23 05/19/2016  . Tdap 02/17/2012    Qualifies for Shingles Vaccine? Advised to get shingles vaccine in the pharmacy.   Screening Tests Health Maintenance  Topic Date Due  . FOOT EXAM  10/25/2019  . HEMOGLOBIN A1C  04/20/2020  . OPHTHALMOLOGY EXAM  08/14/2020  . TETANUS/TDAP  02/16/2022  . INFLUENZA VACCINE  Completed  . DEXA SCAN  Completed  . PNA vac Low Risk Adult  Completed    Cancer Screenings: Lung: Low Dose CT Chest recommended if Age 69-80 years, 30 pack-year currently smoking OR have quit w/in 15years. Patient does not qualify. Breast:  Up to date on Mammogram? Yes   Up to date of Bone Density/Dexa? Yes Colorectal:N/A   Additional Screenings: Hepatitis C Screening: Low risk      Plan:  - Advised to get Shingrex vaccine at her pharmacy    I have personally reviewed and noted the following in the patient's chart:   . Medical and social  history . Use of alcohol, tobacco or illicit drugs  . Current medications and supplements . Functional ability and status . Nutritional status . Physical activity . Advanced directives . List of other physicians . Hospitalizations, surgeries, and ER visits in previous 12 months . Vitals . Screenings to include cognitive, depression, and falls . Referrals and appointments  In addition, I have reviewed and discussed with patient certain preventive protocols, quality metrics, and best practice recommendations. A written personalized care plan for preventive services as well as general preventive health recommendations were provided to patient.   Sandrea Hughs, NP  01/04/2020

## 2020-01-04 NOTE — Patient Instructions (Signed)
[name] , Thank you for taking time to come for your Medicare Wellness Visit. I appreciate your ongoing commitment to your health goals. Please review the following plan we discussed and let me know if I can assist you in the future.   Screening recommendations/referrals: Colonoscopy N/A  Mammogram: Up to date  Bone Density : Up to date  Recommended yearly ophthalmology/optometry visit for glaucoma screening and checkup Recommended yearly dental visit for hygiene and checkup  Vaccinations: Influenza vaccine : Up to date  Pneumococcal vaccine : Up to date  Tdap vaccine : Up to date due 02/16/2022  Shingles vaccine: Please get vaccine at your pharmacy    Advanced directives: No   Conditions/risks identified: Advance age > 64 yrs, Type 2 Diabetes,Hypertension,Hx smoking,dyslipidemia,Obesity BMI > 30,sedentary   Next appointment: 1 year    Preventive Care 34 Years and Older, Female Preventive care refers to lifestyle choices and visits with your health care provider that can promote health and wellness. What does preventive care include?  A yearly physical exam. This is also called an annual well check.  Dental exams once or twice a year.  Routine eye exams. Ask your health care provider how often you should have your eyes checked.  Personal lifestyle choices, including:  Daily care of your teeth and gums.  Regular physical activity.  Eating a healthy diet.  Avoiding tobacco and drug use.  Limiting alcohol use.  Practicing safe sex.  Taking low-dose aspirin every day.  Taking vitamin and mineral supplements as recommended by your health care provider. What happens during an annual well check? The services and screenings done by your health care provider during your annual well check will depend on your age, overall health, lifestyle risk factors, and family history of disease. Counseling  Your health care provider may ask you questions about your:  Alcohol use.   Tobacco use.  Drug use.  Emotional well-being.  Home and relationship well-being.  Sexual activity.  Eating habits.  History of falls.  Memory and ability to understand (cognition).  Work and work Statistician.  Reproductive health. Screening  You may have the following tests or measurements:  Height, weight, and BMI.  Blood pressure.  Lipid and cholesterol levels. These may be checked every 5 years, or more frequently if you are over 70 years old.  Skin check.  Lung cancer screening. You may have this screening every year starting at age 13 if you have a 30-pack-year history of smoking and currently smoke or have quit within the past 15 years.  Fecal occult blood test (FOBT) of the stool. You may have this test every year starting at age 43.  Flexible sigmoidoscopy or colonoscopy. You may have a sigmoidoscopy every 5 years or a colonoscopy every 10 years starting at age 44.  Hepatitis C blood test.  Hepatitis B blood test.  Sexually transmitted disease (STD) testing.  Diabetes screening. This is done by checking your blood sugar (glucose) after you have not eaten for a while (fasting). You may have this done every 1-3 years.  Bone density scan. This is done to screen for osteoporosis. You may have this done starting at age 76.  Mammogram. This may be done every 1-2 years. Talk to your health care provider about how often you should have regular mammograms. Talk with your health care provider about your test results, treatment options, and if necessary, the need for more tests. Vaccines  Your health care provider may recommend certain vaccines, such as:  Influenza vaccine. This is recommended every year.  Tetanus, diphtheria, and acellular pertussis (Tdap, Td) vaccine. You may need a Td booster every 10 years.  Zoster vaccine. You may need this after age 92.  Pneumococcal 13-valent conjugate (PCV13) vaccine. One dose is recommended after age 43.   Pneumococcal polysaccharide (PPSV23) vaccine. One dose is recommended after age 24. Talk to your health care provider about which screenings and vaccines you need and how often you need them. This information is not intended to replace advice given to you by your health care provider. Make sure you discuss any questions you have with your health care provider. Document Released: 12/26/2015 Document Revised: 08/18/2016 Document Reviewed: 09/30/2015 Elsevier Interactive Patient Education  2017 [place] Prevention in the Home Falls can cause injuries. They can happen to people of all ages. There are many things you can do to make your home safe and to help prevent falls. What can I do on the outside of my home?  Regularly fix the edges of walkways and driveways and fix any cracks.  Remove anything that might make you trip as you walk through a door, such as a raised step or threshold.  Trim any bushes or trees on the path to your home.  Use bright outdoor lighting.  Clear any walking paths of anything that might make someone trip, such as rocks or tools.  Regularly check to see if handrails are loose or broken. Make sure that both sides of any steps have handrails.  Any raised decks and porches should have guardrails on the edges.  Have any leaves, snow, or ice cleared regularly.  Use sand or salt on walking paths during winter.  Clean up any spills in your garage right away. This includes oil or grease spills. What can I do in the bathroom?  Use night lights.  Install grab bars by the toilet and in the tub and shower. Do not use towel bars as grab bars.  Use non-skid mats or decals in the tub or shower.  If you need to sit down in the shower, use a plastic, non-slip stool.  Keep the floor dry. Clean up any water that spills on the floor as soon as it happens.  Remove soap buildup in the tub or shower regularly.  Attach bath mats securely with double-sided non-slip  rug tape.  Do not have throw rugs and other things on the floor that can make you trip. What can I do in the bedroom?  Use night lights.  Make sure that you have a light by your bed that is easy to reach.  Do not use any sheets or blankets that are too big for your bed. They should not hang down onto the floor.  Have a firm chair that has side arms. You can use this for support while you get dressed.  Do not have throw rugs and other things on the floor that can make you trip. What can I do in the kitchen?  Clean up any spills right away.  Avoid walking on wet floors.  Keep items that you use a lot in easy-to-reach places.  If you need to reach something above you, use a strong step stool that has a grab bar.  Keep electrical cords out of the way.  Do not use floor polish or wax that makes floors slippery. If you must use wax, use non-skid floor wax.  Do not have throw rugs and other things on the floor that  can make you trip. What can I do with my stairs?  Do not leave any items on the stairs.  Make sure that there are handrails on both sides of the stairs and use them. Fix handrails that are broken or loose. Make sure that handrails are as long as the stairways.  Check any carpeting to make sure that it is firmly attached to the stairs. Fix any carpet that is loose or worn.  Avoid having throw rugs at the top or bottom of the stairs. If you do have throw rugs, attach them to the floor with carpet tape.  Make sure that you have a light switch at the top of the stairs and the bottom of the stairs. If you do not have them, ask someone to add them for you. What else can I do to help prevent falls?  Wear shoes that:  Do not have high heels.  Have rubber bottoms.  Are comfortable and fit you well.  Are closed at the toe. Do not wear sandals.  If you use a stepladder:  Make sure that it is fully opened. Do not climb a closed stepladder.  Make sure that both sides of  the stepladder are locked into place.  Ask someone to hold it for you, if possible.  Clearly mark and make sure that you can see:  Any grab bars or handrails.  First and last steps.  Where the edge of each step is.  Use tools that help you move around (mobility aids) if they are needed. These include:  Canes.  Walkers.  Scooters.  Crutches.  Turn on the lights when you go into a dark area. Replace any light bulbs as soon as they burn out.  Set up your furniture so you have a clear path. Avoid moving your furniture around.  If any of your floors are uneven, fix them.  If there are any pets around you, be aware of where they are.  Review your medicines with your doctor. Some medicines can make you feel dizzy. This can increase your chance of falling. Ask your doctor what other things that you can do to help prevent falls. This information is not intended to replace advice given to you by your health care provider. Make sure you discuss any questions you have with your health care provider. Document Released: 09/25/2009 Document Revised: 05/06/2016 Document Reviewed: 01/03/2015 Elsevier Interactive Patient Education  2017 [employer].

## 2020-01-10 DIAGNOSIS — H3521 Other non-diabetic proliferative retinopathy, right eye: Secondary | ICD-10-CM | POA: Diagnosis not present

## 2020-01-10 DIAGNOSIS — H35372 Puckering of macula, left eye: Secondary | ICD-10-CM | POA: Diagnosis not present

## 2020-01-10 DIAGNOSIS — D573 Sickle-cell trait: Secondary | ICD-10-CM | POA: Diagnosis not present

## 2020-01-10 DIAGNOSIS — Z8669 Personal history of other diseases of the nervous system and sense organs: Secondary | ICD-10-CM | POA: Diagnosis not present

## 2020-01-10 DIAGNOSIS — E119 Type 2 diabetes mellitus without complications: Secondary | ICD-10-CM | POA: Diagnosis not present

## 2020-01-10 DIAGNOSIS — H35371 Puckering of macula, right eye: Secondary | ICD-10-CM | POA: Diagnosis not present

## 2020-01-10 DIAGNOSIS — H35351 Cystoid macular degeneration, right eye: Secondary | ICD-10-CM | POA: Diagnosis not present

## 2020-01-23 ENCOUNTER — Ambulatory Visit: Payer: Medicare Other | Admitting: Podiatry

## 2020-01-30 ENCOUNTER — Other Ambulatory Visit: Payer: Self-pay | Admitting: *Deleted

## 2020-01-30 DIAGNOSIS — I10 Essential (primary) hypertension: Secondary | ICD-10-CM

## 2020-01-30 MED ORDER — AMLODIPINE BESYLATE 5 MG PO TABS: 5.0000 mg | ORAL_TABLET | Freq: Every day | ORAL | 1 refills | Status: DC

## 2020-01-30 NOTE — Telephone Encounter (Signed)
Patient requested refill to be sent to [facility].  

## 2020-02-11 DIAGNOSIS — Z23 Encounter for immunization: Secondary | ICD-10-CM | POA: Diagnosis not present

## 2020-02-18 ENCOUNTER — Other Ambulatory Visit: Payer: Self-pay

## 2020-02-18 ENCOUNTER — Other Ambulatory Visit: Payer: Medicare Other

## 2020-02-18 DIAGNOSIS — E785 Hyperlipidemia, unspecified: Secondary | ICD-10-CM

## 2020-02-18 DIAGNOSIS — E119 Type 2 diabetes mellitus without complications: Secondary | ICD-10-CM | POA: Diagnosis not present

## 2020-02-18 DIAGNOSIS — I10 Essential (primary) hypertension: Secondary | ICD-10-CM | POA: Diagnosis not present

## 2020-02-18 DIAGNOSIS — K219 Gastro-esophageal reflux disease without esophagitis: Secondary | ICD-10-CM

## 2020-02-19 LAB — LIPID PANEL
Cholesterol: 165 mg/dL (ref <200)
HDL: 59 mg/dL (ref >=50)
LDL Cholesterol (Calc): 92 mg/dL (calc)
Non-HDL Cholesterol (Calc): 106 mg/dL (calc) (ref <130)
Total CHOL/HDL Ratio: 2.8 (calc) (ref <5.0)
Triglycerides: 57 mg/dL (ref <150)

## 2020-02-19 LAB — CBC WITH DIFFERENTIAL/PLATELET
Absolute Monocytes: 467 cells/uL (ref 200–950)
Basophils Absolute: 40 cells/uL (ref 0–200)
Basophils Relative: 0.7 %
Eosinophils Absolute: 211 cells/uL (ref 15–500)
Eosinophils Relative: 3.7 %
HCT: 41 % (ref 35.0–45.0)
Hemoglobin: 13.5 g/dL (ref 11.7–15.5)
Lymphs Abs: 2924 cells/uL (ref 850–3900)
MCH: 26.1 pg — ABNORMAL LOW (ref 27.0–33.0)
MCHC: 32.9 g/dL (ref 32.0–36.0)
MCV: 79.2 fL — ABNORMAL LOW (ref 80.0–100.0)
MPV: 12.1 fL (ref 7.5–12.5)
Monocytes Relative: 8.2 %
Neutro Abs: 2058 cells/uL (ref 1500–7800)
Neutrophils Relative %: 36.1 %
Platelets: 201 10*3/uL (ref 140–400)
RBC: 5.18 10*6/uL — ABNORMAL HIGH (ref 3.80–5.10)
RDW: 15.7 % — ABNORMAL HIGH (ref 11.0–15.0)
Total Lymphocyte: 51.3 %
WBC: 5.7 10*3/uL (ref 3.8–10.8)

## 2020-02-19 LAB — COMPLETE METABOLIC PANEL WITH GFR
AG Ratio: 1.2 (calc) (ref 1.0–2.5)
ALT: 13 U/L (ref 6–29)
AST: 11 U/L (ref 10–35)
Albumin: 3.9 g/dL (ref 3.6–5.1)
Alkaline phosphatase (APISO): 60 U/L (ref 37–153)
BUN: 14 mg/dL (ref 7–25)
CO2: 28 mmol/L (ref 20–32)
Calcium: 9.6 mg/dL (ref 8.6–10.4)
Chloride: 104 mmol/L (ref 98–110)
Creat: 0.67 mg/dL (ref 0.60–0.93)
GFR, Est African American: 98 mL/min/{1.73_m2} (ref >=60)
GFR, Est Non African American: 85 mL/min/{1.73_m2} (ref >=60)
Globulin: 3.2 g/dL (calc) (ref 1.9–3.7)
Glucose, Bld: 110 mg/dL — ABNORMAL HIGH (ref 65–99)
Potassium: 4.1 mmol/L (ref 3.5–5.3)
Sodium: 139 mmol/L (ref 135–146)
Total Bilirubin: 0.4 mg/dL (ref 0.2–1.2)
Total Protein: 7.1 g/dL (ref 6.1–8.1)

## 2020-02-19 LAB — HEMOGLOBIN A1C
Hgb A1c MFr Bld: 6.5 % of total Hgb — ABNORMAL HIGH (ref <5.7)
Mean Plasma Glucose: 140 (calc)
eAG (mmol/L): 7.7 (calc)

## 2020-02-19 LAB — TSH: TSH: 3.42 mIU/L (ref 0.40–4.50)

## 2020-02-21 ENCOUNTER — Encounter: Payer: Self-pay | Admitting: Family

## 2020-02-21 ENCOUNTER — Ambulatory Visit (INDEPENDENT_AMBULATORY_CARE_PROVIDER_SITE_OTHER): Payer: Medicare Other | Admitting: Family

## 2020-02-21 ENCOUNTER — Other Ambulatory Visit: Payer: Self-pay

## 2020-02-21 VITALS — BP 160/80 | HR 67 | Temp 97.7°F | Ht 61.0 in | Wt 161.0 lb

## 2020-02-21 DIAGNOSIS — J301 Allergic rhinitis due to pollen: Secondary | ICD-10-CM | POA: Diagnosis not present

## 2020-02-21 DIAGNOSIS — E785 Hyperlipidemia, unspecified: Secondary | ICD-10-CM

## 2020-02-21 DIAGNOSIS — E119 Type 2 diabetes mellitus without complications: Secondary | ICD-10-CM | POA: Diagnosis not present

## 2020-02-21 DIAGNOSIS — I10 Essential (primary) hypertension: Secondary | ICD-10-CM

## 2020-02-21 DIAGNOSIS — K219 Gastro-esophageal reflux disease without esophagitis: Secondary | ICD-10-CM

## 2020-02-21 DIAGNOSIS — M8580 Other specified disorders of bone density and structure, unspecified site: Secondary | ICD-10-CM | POA: Diagnosis not present

## 2020-02-21 DIAGNOSIS — J45909 Unspecified asthma, uncomplicated: Secondary | ICD-10-CM

## 2020-02-21 MED ORDER — MOMETASONE FUROATE 50 MCG/ACT NA SUSP: 1.0000 | Freq: Two times a day (BID) | NASAL | 0 refills | Status: DC | PRN

## 2020-02-21 NOTE — Progress Notes (Signed)
Provider: Marlowe Sax FNP-C   Martita Brumm, Nelda Bucks, NP  Patient Care Team: Pia Jedlicka, Nelda Bucks, NP as PCP - General (Family Medicine) Teena Irani, MD (Inactive) as Consulting Physician (Gastroenterology) Warden Fillers, MD as Consulting Physician (Ophthalmology) [name], MD as Consulting Physician (Ophthalmology) Mickel Fuchs, MD as Referring Physician (Psychiatry)  Extended Emergency Contact Information Primary Emergency Contact: Hurricane Phone: [phone] Relation: None  Code Status:  Full Code  Goals of care: Advanced Directive information Advanced Directives 02/21/2020  Does Patient Have a Medical Advance Directive? No  Does patient want to make changes to medical advance directive? -  Would patient like information on creating a medical advance directive? No - Patient declined     Chief Complaint  Patient presents with  . Medical Management of Chronic Issues    83[facility] follow-up, discuss labs    HPI:  Pt is a 79y.o. female seen today for medical management of chronic diseases.she denies any acute issues today.she states had her COVID-19 vaccine march 1 st 2021.Had no reaction.she is scheduled for 2 nd dose  She states no fall episode.Has had one pound weight loss since last visit which is beneficial for her. Hypertension - B/p high today but states has not taken her blood pressure medication this morning.Has no B/p cuff at home but plans to get one.on carvedilol 12.5 mg tablet daily ,losartan 100 mg tablet daily and amlodipine 5 mg tablet daily.she denies any headache,dizziness,chest pain or shortness of breath.   Hyperlipidemia - labs reviewed and discussed with patient.Copy of labs given.LDL at goal.states trying to eat low saturated fats diet like chicken and TKuwaitTaking Crestor 20 mg tablet three times per week ,Zetia 10 mg tablet daily and Omega- 3 500 mg capsule three times daily. .she denies any muscle weakness.  Type 2 DM - no log brought in to  office visit today.recalls blood sugars in the 100's-110's.recent Hgb A1C reviewed was 6.5 slightly high from 4 months ago which was 6.4 currently on metformin 500 mg tablet daily.Exercises on stationary machine and watches her diet.   GERD - states symptoms under control.on Protonix 40 mg tablet daily.Reports no blood in the stool.Recent Hgb stable.   Allergies - Has had nasal drainage worst in the morning.Drainage runs down back of the throat has to spit out.Has Allegra 180 mg tablet as needed and Nasonex 50 mcg nasal spray as needed.she has not used medication.Request refill on Nasonex states doesn't use it unless symptoms real bother her.she will start using allegra and nasal spray since spring is here.states takes Zyrtec at bedtime in addition to AMarengowhenever symptoms worsen which works well for her.    Asthma - states symptoms are under control.she denies cough or wheezing.on Albuterol 108 mcg/act inhaler 2 puffs every 6 hours as needed and Symbicort 160 -4.5 mcg /ACT inhaler 2 puffs twice daily.    Healthy maintenance: Had appointment with podiatrist which she cancelled but has rescheduled for may,2021.   Past Medical History:  Diagnosis Date  . Acute upper respiratory infections of unspecified site   . Acute upper respiratory infections of unspecified site   . Acute upper respiratory infections of unspecified site   . Allergic rhinitis due to pollen   . Candidiasis of mouth   . Disorder of bone and cartilage, unspecified   . Diverticulitis of colon (without mention of hemorrhage)(562.11)   . Essential hypertension, benign   . Essential hypertension, benign   . Extrinsic asthma, unspecified   . Herpes simplex with  other ophthalmic complications   . Other abnormal glucose   . Other and unspecified hyperlipidemia   . Other and unspecified hyperlipidemia   . Other and unspecified hyperlipidemia   . Other cataract   . Other cataract   . Other dystrophy of vulva   . Other malaise  and fatigue   . Rash and other nonspecific skin eruption   . Reflux esophagitis   . Thrombocytopenia, unspecified ([place])   . Type II or unspecified type diabetes mellitus without mention of complication, not stated as uncontrolled   . Type II or unspecified type diabetes mellitus without mention of complication, not stated as uncontrolled   . Unspecified disorder of skin and subcutaneous tissue   . Unspecified essential hypertension   . Unspecified essential hypertension    Past Surgical History:  Procedure Laterality Date  . ABDOMINAL HYSTERECTOMY    . CESAREAN SECTION     [phone]  . CYST EXCISION  1974   Pilonidal Cyst excision  . EYE SURGERY Right 04/14/2017   Dr. Zadie Rhine  . KNEE ARTHROSCOPY Right    for meniscal tear  . RETINAL TEAR REPAIR CRYOTHERAPY  10/21/2017   Getting injections in right eye    Allergies  Allergen Reactions  . Crestor [Rosuvastatin Calcium]     Muscle cramps  . Latex Other (See Comments)    GLOVES  . Sulfa Antibiotics     Allergies as of 02/21/2020      Reactions   Crestor [rosuvastatin Calcium]    Muscle cramps   Latex Other (See Comments)   GLOVES   Sulfa Antibiotics       Medication List       Accurate as of February 21, 2020 10:18 AM. If you have any questions, ask your nurse or doctor.        acetaminophen 325 MG tablet Commonly known as: TYLENOL Take 325 mg by mouth daily as needed. For pain   albuterol 108 (90 Base) MCG/ACT inhaler Commonly known as: Ventolin HFA Inhale 2 puffs into the lungs every 6 (six) hours as needed for wheezing or shortness of breath.   amLODipine 5 MG tablet Commonly known as: NORVASC Take 1 tablet (5 mg total) by mouth daily.   aspirin 81 MG tablet Take 81 mg by mouth daily as needed.   budesonide-formoterol 160-4.5 MCG/ACT inhaler Commonly known as: Symbicort USE 2 INHALATIONS TWICE DAILY   calcium-vitamin D 500-200 MG-UNIT tablet Commonly known as: OSCAL WITH D Take 1 tablet by  mouth 2 (two) times daily.   carvedilol 12.5 MG tablet Commonly known as: COREG TAKE 1 TABLET BY MOUTH EVERY DAY FOR BLOOD PRESSURE   ELDERBERRY PO Take by mouth 3 (three) times a week.   ezetimibe 10 MG tablet Commonly known as: ZETIA Take 1 tablet (10 mg total) by mouth daily.   fexofenadine 180 MG tablet Commonly known as: ALLEGRA Take 180 mg by mouth as needed.   KETOROLAC TROMETHAMINE OP Apply 1 drop to eye 4 (four) times daily. Right eye   losartan 100 MG tablet Commonly known as: Cozaar Take 1 tablet (100 mg total) by mouth daily.   Magnesium Oxide 500 MG Caps Take 2 capsules by mouth at bedtime.   metFORMIN 500 MG tablet Commonly known as: GLUCOPHAGE Take 1 tablet (500 mg total) by mouth daily with breakfast.   mometasone 50 MCG/ACT nasal spray Commonly known as: NASONEX Place 1 spray into the nose 2 (two) times daily as needed.   multivitamin tablet Take 1  tablet by mouth daily.   Omega-3 500 MG Caps Take by mouth 3 (three) times a week.   pantoprazole 40 MG tablet Commonly known as: PROTONIX Take 1 tablet (40 mg total) by mouth daily before breakfast.   phenylephrine-shark liver oil-mineral oil-petrolatum 0.25-3-14-71.9 % rectal ointment Commonly known as: PREPARATION H Place 1 application rectally 2 (two) times daily as needed for hemorrhoids.   prednisoLONE acetate 1 % ophthalmic suspension Commonly known as: PRED FORTE Place 1 drop into the right eye 4 (four) times daily.   psyllium 0.52 g capsule Commonly known as: REGULOID Take 0.52 g by mouth daily.   rosuvastatin 20 MG tablet Commonly known as: CRESTOR Take one tablet three times a week, Monday, Wednesday and Friday   simethicone 80 MG chewable tablet Commonly known as: Gas-X Chew 1 tablet (80 mg total) by mouth every 6 (six) hours as needed for flatulence.   vitamin C 500 MG tablet Commonly known as: ASCORBIC ACID Take 500 mg by mouth daily.       Review of Systems    Constitutional: Negative for appetite change, chills, fatigue and fever.  HENT: Positive for postnasal drip and rhinorrhea. Negative for sinus pressure, sinus pain, sneezing, sore throat and trouble swallowing.        Seasonal allergies has not used her PRN medication   Eyes: Negative for pain, discharge, redness and itching.  Respiratory: Negative for cough, chest tightness, shortness of breath and wheezing.   Cardiovascular: Negative for chest pain, palpitations and leg swelling.  Gastrointestinal: Negative for abdominal distention, abdominal pain, constipation, diarrhea, nausea and vomiting.  Endocrine: Negative for cold intolerance, heat intolerance, polydipsia, polyphagia and polyuria.  Genitourinary: Negative for decreased urine volume, difficulty urinating, dysuria, flank pain, frequency, pelvic pain and urgency.  Musculoskeletal: Negative for arthralgias, back pain, gait problem and myalgias.  Skin: Negative for color change, pallor and rash.  Neurological: Negative for dizziness, speech difficulty, weakness, light-headedness, numbness and headaches.  Hematological: Does not bruise/bleed easily.  Psychiatric/Behavioral: Negative for agitation, confusion and sleep disturbance. The patient is not nervous/anxious.        Sleeps good other than getting up to use the bathroom     Immunization History  Administered Date(s) Administered  . Fluad Quad(high Dose 65+) 08/29/2019  . Influenza Split 08/25/2010, 08/26/2011, 10/04/2012  . Influenza Whole 09/24/2009  . Influenza, High Dose Seasonal PF 08/16/2018  . Influenza,inj,Quad PF,6+ Mos 09/11/2013, 09/04/2014, 11/12/2015, 10/04/2016, 09/06/2017  . Influenza-Unspecified 09/02/2018  . PFIZER SARS-COV-2 Vaccination 02/11/2020  . Pneumococcal Conjugate-13 02/17/2012  . Pneumococcal Polysaccharide-23 05/19/2016  . Tdap 02/17/2012   Pertinent  Health Maintenance Due  Topic Date Due  . FOOT EXAM  10/25/2019  . OPHTHALMOLOGY EXAM   08/14/2020  . HEMOGLOBIN A1C  08/20/2020  . INFLUENZA VACCINE  Completed  . DEXA SCAN  Completed  . PNA vac Low Risk Adult  Completed   Fall Risk  02/21/2020 01/04/2020 10/29/2019 10/24/2019 07/17/2019  Falls in the past year? 0 0 0 0 0  Number falls in past yr: 0 0 0 0 0  Injury with Fall? 0 0 0 0 0    Vitals:   02/21/20 1006  BP: (!) 160/80  Pulse: 67  Temp: 97.7 F (36.5 C)  TempSrc: Oral  SpO2: 96%  Weight: 161 lb (73 kg)  Height: 5' 1"  (1.549 m)   Body mass index is 30.42 kg/m. Physical Exam Vitals reviewed.  Constitutional:      General: She is not in acute distress.  Appearance: She is obese. She is not ill-appearing.  HENT:     Head: Normocephalic.     Right Ear: Tympanic membrane, ear canal and external ear normal. There is no impacted cerumen.     Left Ear: Tympanic membrane, ear canal and external ear normal. There is no impacted cerumen.     Nose: Mucosal edema and rhinorrhea present. No nasal tenderness or congestion.     Right Turbinates: Swollen. Not enlarged or pale.     Left Turbinates: Swollen. Not enlarged or pale.     Right Sinus: No maxillary sinus tenderness or frontal sinus tenderness.     Left Sinus: No maxillary sinus tenderness or frontal sinus tenderness.     Mouth/Throat:     Mouth: Mucous membranes are moist.     Pharynx: No oropharyngeal exudate or posterior oropharyngeal erythema.  Eyes:     General: No scleral icterus.       Right eye: No discharge.        Left eye: No discharge.     Extraocular Movements: Extraocular movements intact.     Conjunctiva/sclera: Conjunctivae normal.     Pupils: Pupils are equal, round, and reactive to light.     Comments: Corrective lens in place   Neck:     Vascular: No carotid bruit.  Cardiovascular:     Rate and Rhythm: Normal rate and regular rhythm.     Pulses: Normal pulses.     Heart sounds: Normal heart sounds. No murmur. No friction rub. No gallop.   Pulmonary:     Effort: Pulmonary effort  is normal. No respiratory distress.     Breath sounds: Normal breath sounds. No wheezing, rhonchi or rales.  Chest:     Chest wall: No tenderness.  Abdominal:     General: Bowel sounds are normal. There is no distension.     Palpations: Abdomen is soft. There is no mass.     Tenderness: There is no abdominal tenderness. There is no right CVA tenderness, left CVA tenderness, guarding or rebound.  Musculoskeletal:        General: No swelling or tenderness. Normal range of motion.     Cervical back: Normal range of motion. No rigidity or tenderness.     Right lower leg: No edema.     Left lower leg: No edema.  Lymphadenopathy:     Cervical: No cervical adenopathy.  Skin:    General: Skin is warm and dry.     Coloration: Skin is not pale.     Findings: No bruising, erythema or rash.  Neurological:     Mental Status: She is alert and oriented to person, place, and time.     Cranial Nerves: No cranial nerve deficit.     Sensory: No sensory deficit.     Motor: No weakness.     Coordination: Coordination normal.     Gait: Gait normal.  Psychiatric:        Mood and Affect: Mood normal.        Behavior: Behavior normal.        Thought Content: Thought content normal.        Judgment: Judgment normal.     Labs reviewed: Recent Labs    04/19/19 0833 10/22/19 0929 02/18/20 0806  NA 141 142 139  K 4.5 4.4 4.1  CL 106 106 104  CO2 30 28 28   GLUCOSE 115* 109* 110*  BUN 15 13 14   CREATININE 0.69 0.70 0.67  CALCIUM 9.4 9.7  9.6   Recent Labs    04/19/19 0833 10/22/19 0929 02/18/20 0806  AST 13 12 11   ALT 14 14 13   BILITOT 0.4 0.4 0.4  PROT 7.1 7.2 7.1   Recent Labs    04/19/19 0833 10/22/19 0929 02/18/20 0806  WBC 6.4 5.2 5.7  NEUTROABS 2,374 2,106 2,058  HGB 13.5 13.5 13.5  HCT 40.9 41.7 41.0  MCV 79.1* 78.7* 79.2*  PLT 179 187 201   Lab Results  Component Value Date   TSH 3.42 02/18/2020   Lab Results  Component Value Date   HGBA1C 6.5 (H) 02/18/2020    Lab Results  Component Value Date   CHOL 165 02/18/2020   HDL 59 02/18/2020   LDLCALC 92 02/18/2020   TRIG 57 02/18/2020   CHOLHDL 2.8 02/18/2020    Significant Diagnostic Results in last 30 days:  No results found.  Assessment/Plan 1. Essential hypertension B/p high today has not taken her blood pressure medication.takes medication after eating breakfast which she did eat prior to visit.encouraged to buy B/p cuff and check B/p once daily at home. Will continue current medication for now.continue on carvedilol 12.5 mg tablet daily ,losartan 100 mg tablet daily and amlodipine 5 mg tablet daily. Continue on statin.  - CBC with Differential/Platelet; Future - CMP with eGFR([facility]); Future - TSH; Future  2. Controlled type 2 diabetes mellitus without complication, without long-term current use of insulin (HCC) Lab Results  Component Value Date   HGBA1C 6.5 (H) 02/18/2020  CBG controlled.continue on metformin 500 mg tablet daily. - Advised to continue with dietary modification and exercise will get out more for exercise once COVID-19 restrictions are over. - TSH; Future - Hemoglobin A1c; Future  3. Hyperlipidemia LDL goal <100 LDL at goal. Continue on dietary modification and exercise at least three times per week for 30 minutes. - continue on Crestor 20 mg tablet three times per week, Zetia 10 mg tablet daily and Omega- 3 500 mg capsule three times daily.Will decrease Crestor to 10 mg tablet next visit if LDL remains stable.  - Lipid panel; Future  4. Extrinsic asthma, unspecified asthma severity, uncomplicated Symptoms under controlled.Has not required her rescue inhaler.continue on Albuterol 108 mcg/act inhaler 2 puffs every 6 hours as needed and Symbicort 160 -4.5 mcg /ACT inhaler 2 puffs twice daily. Continue antihistamine.     5. Gastroesophageal reflux disease without esophagitis Symptoms controlled.Hgb stable.continue on Protonix 40 mg tablet daily.  - CBC with  Differential/Platelet; Future  6. Osteopenia, unspecified location Latest Bone density reviewed T score -2.1 ( 09/18/2019) on right femur neck.continue on Oscal -D 500-200 mg tablet one twice daily. - advised to continue with weight bearing exercises.   7. Seasonal allergic rhinitis due to pollen Has Rhinorrhea,PND,nasal congestion and noted  moderately swollen anterior turbinates noted on exam. Advised to take Allegra and use Nasonex to control symptoms.verbalized understanding .    - mometasone (NASONEX) 50 MCG/ACT nasal spray; Place 1 spray into the nose 2 (two) times daily as needed.  Dispense: 17 g; Refill: 0  Family/ staff Communication: Reviewed plan of care with patient verbalized understanding.   Labs/tests ordered:  - CBC with Differential/Platelet; Future - CMP with eGFR([facility]); Future - TSH; Future - Hemoglobin A1c; Future - Lipid panel; Future  Next Appointment : 4 months for medical management of chronic issues.Labs 2-4 days prior to visit.   Sandrea Hughs, NP

## 2020-02-21 NOTE — Patient Instructions (Signed)
Continue current medication.  DASH Eating Plan DASH stands for "Dietary Approaches to Stop Hypertension." The DASH eating plan is a healthy eating plan that has been shown to reduce high blood pressure (hypertension). It may also reduce your risk for type 2 diabetes, heart disease, and stroke. The DASH eating plan may also help with weight loss. What are tips for following this plan?  General guidelines  Avoid eating more than 2,300 mg (milligrams) of salt (sodium) a day. If you have hypertension, you may need to reduce your sodium intake to 1,500 mg a day.  Limit alcohol intake to no more than 1 drink a day for nonpregnant women and 2 drinks a day for men. One drink equals 12 oz of beer, 5 oz of wine, or 1 oz of hard liquor.  Work with your health care provider to maintain a healthy body weight or to lose weight. Ask what an ideal weight is for you.  Get at least 30 minutes of exercise that causes your heart to beat faster (aerobic exercise) most days of the week. Activities may include walking, swimming, or biking.  Work with your health care provider or diet and nutrition specialist (dietitian) to adjust your eating plan to your individual calorie needs. Reading food labels   Check food labels for the amount of sodium per serving. Choose foods with less than 5 percent of the Daily Value of sodium. Generally, foods with less than 300 mg of sodium per serving fit into this eating plan.  To find whole grains, look for the word "whole" as the first word in the ingredient list. Shopping  Buy products labeled as "low-sodium" or "no salt added."  Buy fresh foods. Avoid canned foods and premade or frozen meals. Cooking  Avoid adding salt when cooking. Use salt-free seasonings or herbs instead of table salt or sea salt. Check with your health care provider or pharmacist before using salt substitutes.  Do not fry foods. Cook foods using healthy methods such as baking, boiling, grilling, and  broiling instead.  Cook with heart-healthy oils, such as olive, canola, soybean, or sunflower oil. Meal planning  Eat a balanced diet that includes: ? 5 or more servings of fruits and vegetables each day. At each meal, try to fill half of your plate with fruits and vegetables. ? Up to 6-8 servings of whole grains each day. ? Less than 6 oz of lean meat, poultry, or fish each day. A 3-oz serving of meat is about the same size as a deck of cards. One egg equals 1 oz. ? 2 servings of low-fat dairy each day. ? A serving of nuts, seeds, or beans 5 times each week. ? Heart-healthy fats. Healthy fats called Omega-3 fatty acids are found in foods such as flaxseeds and coldwater fish, like sardines, salmon, and mackerel.  Limit how much you eat of the following: ? Canned or prepackaged foods. ? Food that is high in trans fat, such as fried foods. ? Food that is high in saturated fat, such as fatty meat. ? Sweets, desserts, sugary drinks, and other foods with added sugar. ? Full-fat dairy products.  Do not salt foods before eating.  Try to eat at least 2 vegetarian meals each week.  Eat more home-cooked food and less restaurant, buffet, and fast food.  When eating at a restaurant, ask that your food be prepared with less salt or no salt, if possible. What foods are recommended? The items listed may not be a complete list. Talk  with your dietitian about what dietary choices are best for you. Grains Whole-grain or whole-wheat bread. Whole-grain or whole-wheat pasta. Brown rice. [place]. Bulgur. Whole-grain and low-sodium cereals. Pita bread. Low-fat, low-sodium crackers. Whole-wheat flour tortillas. Vegetables Fresh or frozen vegetables (raw, steamed, roasted, or grilled). Low-sodium or reduced-sodium tomato and vegetable juice. Low-sodium or reduced-sodium tomato sauce and tomato paste. Low-sodium or reduced-sodium canned vegetables. Fruits All fresh, dried, or frozen fruit. Canned  fruit in natural juice (without added sugar). Meat and other protein foods Skinless chicken or [place]. Ground chicken or [place]. Pork with fat trimmed off. Fish and seafood. Egg whites. Dried beans, peas, or lentils. Unsalted nuts, nut butters, and seeds. Unsalted canned beans. Lean cuts of beef with fat trimmed off. Low-sodium, lean deli meat. Dairy Low-fat (1%) or fat-free (skim) milk. Fat-free, low-fat, or reduced-fat cheeses. Nonfat, low-sodium ricotta or cottage cheese. Low-fat or nonfat yogurt. Low-fat, low-sodium cheese. Fats and oils Soft margarine without trans fats. Vegetable oil. Low-fat, reduced-fat, or light mayonnaise and salad dressings (reduced-sodium). Canola, safflower, olive, soybean, and sunflower oils. Avocado. Seasoning and other foods Herbs. Spices. Seasoning mixes without salt. Unsalted popcorn and pretzels. Fat-free sweets. What foods are not recommended? The items listed may not be a complete list. Talk with your dietitian about what dietary choices are best for you. Grains Baked goods made with fat, such as croissants, muffins, or some breads. Dry pasta or rice meal packs. Vegetables Creamed or fried vegetables. Vegetables in a cheese sauce. Regular canned vegetables (not low-sodium or reduced-sodium). Regular canned tomato sauce and paste (not low-sodium or reduced-sodium). Regular tomato and vegetable juice (not low-sodium or reduced-sodium). Angie Fava. Olives. Fruits Canned fruit in a light or heavy syrup. Fried fruit. Fruit in cream or butter sauce. Meat and other protein foods Fatty cuts of meat. Ribs. Fried meat. Berniece Salines. Sausage. Bologna and other processed lunch meats. Salami. Fatback. Hotdogs. Bratwurst. Salted nuts and seeds. Canned beans with added salt. Canned or smoked fish. Whole eggs or egg yolks. Chicken or [place] with skin. Dairy Whole or 2% milk, cream, and half-and-half. Whole or full-fat cream cheese. Whole-fat or sweetened yogurt. Full-fat cheese.  Nondairy creamers. Whipped toppings. Processed cheese and cheese spreads. Fats and oils Butter. Stick margarine. Lard. Shortening. Ghee. Bacon fat. Tropical oils, such as coconut, palm kernel, or palm oil. Seasoning and other foods Salted popcorn and pretzels. Onion salt, garlic salt, seasoned salt, table salt, and sea salt. Worcestershire sauce. Tartar sauce. Barbecue sauce. Teriyaki sauce. Soy sauce, including reduced-sodium. Steak sauce. Canned and packaged gravies. Fish sauce. Oyster sauce. Cocktail sauce. Horseradish that you find on the shelf. Ketchup. Mustard. Meat flavorings and tenderizers. Bouillon cubes. Hot sauce and Tabasco sauce. Premade or packaged marinades. Premade or packaged taco seasonings. Relishes. Regular salad dressings. Where to find more information:  National Heart, Lung, and [place]: https://wilson-eaton.com/  American Heart Association: www.heart.org Summary  The DASH eating plan is a healthy eating plan that has been shown to reduce high blood pressure (hypertension). It may also reduce your risk for type 2 diabetes, heart disease, and stroke.  With the DASH eating plan, you should limit salt (sodium) intake to 2,300 mg a day. If you have hypertension, you may need to reduce your sodium intake to 1,500 mg a day.  When on the DASH eating plan, aim to eat more fresh fruits and vegetables, whole grains, lean proteins, low-fat dairy, and heart-healthy fats.  Work with your health care provider or diet and nutrition specialist (dietitian) to adjust your  eating plan to your individual calorie needs. This information is not intended to replace advice given to you by your health care provider. Make sure you discuss any questions you have with your health care provider. Document Revised: 11/11/2017 Document Reviewed: 11/22/2016 Elsevier Patient Education  2020 [employer].

## 2020-02-28 ENCOUNTER — Telehealth: Payer: Self-pay

## 2020-02-28 DIAGNOSIS — J301 Allergic rhinitis due to pollen: Secondary | ICD-10-CM

## 2020-02-28 NOTE — Telephone Encounter (Signed)
[name] called in reference to her recent medication of Nasonex (mometasone) 50 mcg was ordered for 1 month instead of 3 month supply and was charged 45.00. She states in the past she would always get a 3 month supply. Please advise

## 2020-02-28 NOTE — Telephone Encounter (Signed)
Okay to approve 3 month supply

## 2020-03-03 MED ORDER — MOMETASONE FUROATE 50 MCG/ACT NA SUSP: 1.0000 | Freq: Two times a day (BID) | NASAL | 3 refills | Status: DC | PRN

## 2020-03-03 NOTE — Telephone Encounter (Signed)
Spoke with patient and advised results   

## 2020-03-03 NOTE — Telephone Encounter (Signed)
Pt calling upset that she only received one Nasonex rx from [facility], pt only takes "as needed" [facility] only sent in [place]. I spoke with [facility] and they will send 3 bottles on the next order. Per [facility] pt can set up so that her refills can be approved before mailing out.

## 2020-03-03 NOTE — Telephone Encounter (Signed)
rx sent to pharmacy by [facility]-script  

## 2020-03-10 DIAGNOSIS — Z23 Encounter for immunization: Secondary | ICD-10-CM | POA: Diagnosis not present

## 2020-03-11 ENCOUNTER — Ambulatory Visit: Payer: Medicare Other | Admitting: Podiatry

## 2020-03-13 ENCOUNTER — Other Ambulatory Visit: Payer: Self-pay | Admitting: *Deleted

## 2020-03-13 MED ORDER — METFORMIN HCL 500 MG PO TABS: 500.0000 mg | ORAL_TABLET | Freq: Every day | ORAL | 1 refills | Status: DC

## 2020-03-13 MED ORDER — EZETIMIBE 10 MG PO TABS: 10.0000 mg | ORAL_TABLET | Freq: Every day | ORAL | 1 refills | Status: DC

## 2020-03-13 NOTE — Telephone Encounter (Signed)
Patient requested Rx for Metformin and Zetia to be sent to [facility]. Faxed.

## 2020-04-04 ENCOUNTER — Other Ambulatory Visit: Payer: Self-pay

## 2020-04-04 ENCOUNTER — Ambulatory Visit (INDEPENDENT_AMBULATORY_CARE_PROVIDER_SITE_OTHER): Payer: Medicare Other | Admitting: Family

## 2020-04-04 ENCOUNTER — Encounter: Payer: Self-pay | Admitting: Family

## 2020-04-04 DIAGNOSIS — J301 Allergic rhinitis due to pollen: Secondary | ICD-10-CM | POA: Diagnosis not present

## 2020-04-04 DIAGNOSIS — J45909 Unspecified asthma, uncomplicated: Secondary | ICD-10-CM | POA: Diagnosis not present

## 2020-04-04 MED ORDER — ALBUTEROL SULFATE HFA 108 (90 BASE) MCG/ACT IN AERS: 2.0000 | INHALATION_SPRAY | Freq: Four times a day (QID) | RESPIRATORY_TRACT | 1 refills | Status: DC | PRN

## 2020-04-04 MED ORDER — SALINE SPRAY 0.65 % NA SOLN: 1.0000 | NASAL | 0 refills | Status: AC | PRN

## 2020-04-04 NOTE — Patient Instructions (Addendum)
- Avoid allergens as much as possible including not opening widows due to pollen - continue on either Allegra or Zyrtec daily advised not to use both since both are antihistamine. - sodium chloride (OCEAN) 0.65 % SOLN nasal spray; Place 1 spray into both nostrils as needed for congestion.  Dispense: 30 mL; Refill: 0  Allergic Rhinitis, Adult Allergic rhinitis is a reaction to allergens in the air. Allergens are tiny specks (particles) in the air that cause your body to have an allergic reaction. This condition cannot be passed from person to person (is not contagious). Allergic rhinitis cannot be cured, but it can be controlled. There are two types of allergic rhinitis:  Seasonal. This type is also called hay fever. It happens only during certain times of the year.  Perennial. This type can happen at any time of the year. What are the causes? This condition may be caused by:  Pollen from grasses, trees, and weeds.  House dust mites.  Pet dander.  Mold. What are the signs or symptoms? Symptoms of this condition include:  Sneezing.  Runny or stuffy nose (nasal congestion).  A lot of mucus in the back of the throat (postnasal drip).  Itchy nose.  Tearing of the eyes.  Trouble sleeping.  Being sleepy during day. How is this treated? There is no cure for this condition. You should avoid things that trigger your symptoms (allergens). Treatment can help to relieve symptoms. This may include:  Medicines that block allergy symptoms, such as antihistamines. These may be given as a shot, nasal spray, or pill.  Shots that are given until your body becomes less sensitive to the allergen (desensitization).  Stronger medicines, if all other treatments have not worked. Follow these instructions at home: Avoiding allergens   Find out what you are allergic to. Common allergens include smoke, dust, and pollen.  Avoid them if you can. These are some of the things that you can do to  avoid allergens: ? Replace carpet with wood, tile, or vinyl flooring. Carpet can trap dander and dust. ? Clean any mold found in the home. ? Do not smoke. Do not allow smoking in your home. ? Change your heating and air conditioning filter at least once a month. ? During allergy season:  Keep windows closed as much as you can. If possible, use air conditioning when there is a lot of pollen in the air.  Use a special filter for allergies with your furnace and air conditioner.  Plan outdoor activities when pollen counts are lowest. This is usually during the early morning or evening hours.  If you do go outdoors when pollen count is high, wear a special mask for people with allergies.  When you come indoors, take a shower and change your clothes before sitting on furniture or bedding. General instructions  Do not use fans in your home.  Do not hang clothes outside to dry.  Wear sunglasses to keep pollen out of your eyes.  Wash your hands right away after you touch household pets.  Take over-the-counter and prescription medicines only as told by your doctor.  Keep all follow-up visits as told by your doctor. This is important. Contact a doctor if:  You have a fever.  You have a cough that does not go away (is persistent).  You start to make whistling sounds when you breathe (wheeze).  Your symptoms do not get better with treatment.  You have thick fluid coming from your nose.  You start to  have nosebleeds. Get help right away if:  Your tongue or your lips are swollen.  You have trouble breathing.  You feel dizzy or you feel like you are going to pass out (faint).  You have cold sweats. Summary  Allergic rhinitis is a reaction to allergens in the air.  This condition may be caused by allergens. These include pollen, dust mites, pet dander, and mold.  Symptoms include a runny, itchy nose, sneezing, or tearing eyes. You may also have trouble sleeping or feel sleepy  during the day.  Treatment includes taking medicines and avoiding allergens. You may also get shots or take stronger medicines.  Get help if you have a fever or a cough that does not stop. Get help right away if you are short of breath. This information is not intended to replace advice given to you by your health care provider. Make sure you discuss any questions you have with your health care provider. Document Revised: 03/20/2019 Document Reviewed: 06/20/2018 Elsevier Patient Education  [place].

## 2020-04-04 NOTE — Progress Notes (Signed)
Patient ID: Tanya Bell, female   DOB: 01-03-1941, 79 y.o.   MRN: [id number] This service is provided via telemedicine  No vital signs collected/recorded due to the encounter was a telemedicine visit.   Location of patient (ex: home, work):  HOME  Patient consents to a telephone visit:  YES  Location of the provider (ex: office, home):  OFFICE  Name of any referring provider:  [facility] Neziah Braley, NP  Names of all persons participating in the telemedicine service and their role in the encounter:  PATIENT, Edwin Dada, [place], [facility] Abbegale Stehle, NP  Time spent on call:  6:14  Provider: Karys Meckley FNP-C  Taylor, Nelda Bucks, NP  Patient Care Team: Telitha Plath, Nelda Bucks, NP as PCP - General (Family Medicine) Teena Irani, MD (Inactive) as Consulting Physician (Gastroenterology) Warden Fillers, MD as Consulting Physician (Ophthalmology) [name], MD as Consulting Physician (Ophthalmology) Mickel Fuchs, MD as Referring Physician (Psychiatry)  Extended Emergency Contact Information Primary Emergency Contact: [place] Phone: [phone] Relation: None  Code Status: Full Code  Goals of care: Advanced Directive information Advanced Directives 02/21/2020  Does Patient Have a Medical Advance Directive? No  Does patient want to make changes to medical advance directive? -  Would patient like information on creating a medical advance directive? No - Patient declined     Chief Complaint  Patient presents with  . Acute Visit    sinus infection    HPI:  Pt is a 79 y.o. female seen today for an acute visit for evaluation of sinus issues.States has had worsening runny nose and sneezing.Also having drainage that runs behind her throat.she has used Allegra 180 mg 24 Hr tablet in the morning and Zrytec 10 mg tablet at bedtime.Her eyes have been watery.she states taking allegra and Zyrtec has helped in the past.Discussed with her that both medication are used for allergies but  states shouldn't be using them but does anyway because it helps. She denies any sinus pressure or pain,fever or chills    Past Medical History:  Diagnosis Date  . Acute upper respiratory infections of unspecified site   . Acute upper respiratory infections of unspecified site   . Acute upper respiratory infections of unspecified site   . Allergic rhinitis due to pollen   . Candidiasis of mouth   . Disorder of bone and cartilage, unspecified   . Diverticulitis of colon (without mention of hemorrhage)(562.11)   . Essential hypertension, benign   . Essential hypertension, benign   . Extrinsic asthma, unspecified   . Herpes simplex with other ophthalmic complications   . Other abnormal glucose   . Other and unspecified hyperlipidemia   . Other and unspecified hyperlipidemia   . Other and unspecified hyperlipidemia   . Other cataract   . Other cataract   . Other dystrophy of vulva   . Other malaise and fatigue   . Rash and other nonspecific skin eruption   . Reflux esophagitis   . Thrombocytopenia, unspecified ([place])   . Type II or unspecified type diabetes mellitus without mention of complication, not stated as uncontrolled   . Type II or unspecified type diabetes mellitus without mention of complication, not stated as uncontrolled   . Unspecified disorder of skin and subcutaneous tissue   . Unspecified essential hypertension   . Unspecified essential hypertension    Past Surgical History:  Procedure Laterality Date  . ABDOMINAL HYSTERECTOMY    . CESAREAN SECTION     [phone]  . CYST EXCISION  1974   Pilonidal Cyst excision  . EYE SURGERY Right 04/14/2017   Dr. Zadie Rhine  . KNEE ARTHROSCOPY Right    for meniscal tear  . RETINAL TEAR REPAIR CRYOTHERAPY  10/21/2017   Getting injections in right eye    Allergies  Allergen Reactions  . Crestor [Rosuvastatin Calcium]     Muscle cramps  . Latex Other (See Comments)    GLOVES  . Sulfa Antibiotics     Outpatient  Encounter Medications as of 04/04/2020  Medication Sig  . acetaminophen (TYLENOL) 325 MG tablet Take 325 mg by mouth daily as needed. For pain  . albuterol (VENTOLIN HFA) 108 (90 Base) MCG/ACT inhaler Inhale 2 puffs into the lungs every 6 (six) hours as needed for wheezing or shortness of breath.  Marland Kitchen amLODipine (NORVASC) 5 MG tablet Take 1 tablet (5 mg total) by mouth daily.  . budesonide-formoterol (SYMBICORT) 160-4.5 MCG/ACT inhaler USE 2 INHALATIONS TWICE DAILY  . calcium-vitamin D (OSCAL WITH D) 500-200 MG-UNIT per tablet Take 1 tablet by mouth 2 (two) times daily.  . carvedilol (COREG) 12.5 MG tablet TAKE 1 TABLET BY MOUTH EVERY DAY FOR BLOOD PRESSURE  . ELDERBERRY PO Take by mouth 3 (three) times a week.   . ezetimibe (ZETIA) 10 MG tablet Take 1 tablet (10 mg total) by mouth daily.  . fexofenadine (ALLEGRA) 180 MG tablet Take 180 mg by mouth as needed.   Marland Kitchen KETOROLAC TROMETHAMINE OP Apply 1 drop to eye 4 (four) times daily. Right eye  . losartan (COZAAR) 100 MG tablet Take 1 tablet (100 mg total) by mouth daily.  . Magnesium Oxide 500 MG CAPS Take 2 capsules by mouth at bedtime.  . metFORMIN (GLUCOPHAGE) 500 MG tablet Take 1 tablet (500 mg total) by mouth daily with breakfast.  . mometasone (NASONEX) 50 MCG/ACT nasal spray Place 1 spray into the nose 2 (two) times daily as needed.  . prednisoLONE acetate (PRED FORTE) 1 % ophthalmic suspension Place 1 drop into the right eye 4 (four) times daily.   . psyllium (REGULOID) 0.52 g capsule Take 0.52 g by mouth daily.  . rosuvastatin (CRESTOR) 20 MG tablet Take one tablet three times a week, Monday, Wednesday and Friday  . simethicone (GAS-X) 80 MG chewable tablet Chew 1 tablet (80 mg total) by mouth every 6 (six) hours as needed for flatulence.  . [DISCONTINUED] aspirin 81 MG tablet Take 81 mg by mouth daily as needed.   . [DISCONTINUED] Krill Oil (OMEGA-3) 500 MG CAPS Take by mouth 3 (three) times a week.   . [DISCONTINUED] Multiple Vitamin  (MULTIVITAMIN) tablet Take 1 tablet by mouth daily.  . [DISCONTINUED] pantoprazole (PROTONIX) 40 MG tablet Take 1 tablet (40 mg total) by mouth daily before breakfast.  . [DISCONTINUED] phenylephrine-shark liver oil-mineral oil-petrolatum (PREPARATION H) 0.25-3-14-71.9 % rectal ointment Place 1 application rectally 2 (two) times daily as needed for hemorrhoids.  . [DISCONTINUED] vitamin C (ASCORBIC ACID) 500 MG tablet Take 500 mg by mouth daily.   No facility-administered encounter medications on file as of 04/04/2020.    Review of Systems  Constitutional: Negative for appetite change, chills, fatigue and fever.  HENT: Positive for congestion, postnasal drip, sneezing and sore throat. Negative for sinus pressure and sinus pain.   Eyes: Negative for discharge, redness and itching.       Watery eyes   Respiratory: Negative for cough, chest tightness, shortness of breath and wheezing.   Cardiovascular: Negative for chest pain, palpitations and leg swelling.  Skin: Negative  for color change, pallor and rash.  Neurological: Negative for dizziness, light-headedness and headaches.    Immunization History  Administered Date(s) Administered  . Fluad Quad(high Dose 65+) 08/29/2019  . Influenza Split 08/25/2010, 08/26/2011, 10/04/2012  . Influenza Whole 09/24/2009  . Influenza, High Dose Seasonal PF 08/16/2018  . Influenza,inj,Quad PF,6+ Mos 09/11/2013, 09/04/2014, 11/12/2015, 10/04/2016, 09/06/2017  . Influenza-Unspecified 09/02/2018  . PFIZER SARS-COV-2 Vaccination 02/11/2020, 03/03/2020  . Pneumococcal Conjugate-13 02/17/2012  . Pneumococcal Polysaccharide-23 05/19/2016  . Tdap 02/17/2012   Pertinent  Health Maintenance Due  Topic Date Due  . FOOT EXAM  10/25/2019  . INFLUENZA VACCINE  07/13/2020  . OPHTHALMOLOGY EXAM  08/14/2020  . HEMOGLOBIN A1C  08/20/2020  . DEXA SCAN  Completed  . PNA vac Low Risk Adult  Completed   Fall Risk  04/04/2020 02/21/2020 01/04/2020 10/29/2019 10/24/2019   Falls in the past year? 0 0 0 0 0  Number falls in past yr: 0 0 0 0 0  Injury with Fall? 0 0 0 0 0   There were no vitals filed for this visit. There is no height or weight on file to calculate BMI. Physical Exam Unable to complete on telephone visit.   Labs reviewed: Recent Labs    04/19/19 0833 10/22/19 0929 02/18/20 0806  NA 141 142 139  K 4.5 4.4 4.1  CL 106 106 104  CO2 30 28 28   GLUCOSE 115* 109* 110*  BUN 15 13 14   CREATININE 0.69 0.70 0.67  CALCIUM 9.4 9.7 9.6   Recent Labs    04/19/19 0833 10/22/19 0929 02/18/20 0806  AST 13 12 11   ALT 14 14 13   BILITOT 0.4 0.4 0.4  PROT 7.1 7.2 7.1   Recent Labs    04/19/19 0833 10/22/19 0929 02/18/20 0806  WBC 6.4 5.2 5.7  NEUTROABS 2,374 2,106 2,058  HGB 13.5 13.5 13.5  HCT 40.9 41.7 41.0  MCV 79.1* 78.7* 79.2*  PLT 179 187 201   Lab Results  Component Value Date   TSH 3.42 02/18/2020   Lab Results  Component Value Date   HGBA1C 6.5 (H) 02/18/2020   Lab Results  Component Value Date   CHOL 165 02/18/2020   HDL 59 02/18/2020   LDLCALC 92 02/18/2020   TRIG 57 02/18/2020   CHOLHDL 2.8 02/18/2020    Significant Diagnostic Results in last 30 days:  No results found.  Assessment/Plan 1. Extrinsic asthma, unspecified asthma severity, uncomplicated Reports no worsening wheezing.request  - albuterol (VENTOLIN HFA) 108 (90 Base) MCG/ACT inhaler; Inhale 2 puffs into the lungs every 6 (six) hours as needed for wheezing or shortness of breath.  Dispense: 18 g; Refill: 1  2. Seasonal allergic rhinitis due to pollen - Advised to avoid allergens as much as possible including not opening widows due to pollen - continue on either Allegra or Zyrtec daily advised not to use both since both are antihistamine. - sodium chloride (OCEAN) 0.65 % SOLN nasal spray; Place 1 spray into both nostrils as needed for congestion.  Dispense: 30 mL; Refill: 0  Family/ staff Communication: Reviewed plan of care with  patient  Labs/tests ordered: None   Next Appointment: as needed if symptoms worsen or fail to improve.   Spent 17 minutes of non-face to face with patient    I connected with  Tanya Bell on 04/04/20 by a video enabled telemedicine application and verified that I am speaking with the correct person using two identifiers.   I discussed the  limitations of evaluation and management by telemedicine. The patient expressed understanding and agreed to proceed.  Sandrea Hughs, NP

## 2020-04-25 ENCOUNTER — Other Ambulatory Visit: Payer: Self-pay | Admitting: *Deleted

## 2020-04-25 DIAGNOSIS — I10 Essential (primary) hypertension: Secondary | ICD-10-CM

## 2020-04-25 MED ORDER — CARVEDILOL 12.5 MG PO TABS: ORAL_TABLET | ORAL | 1 refills | Status: DC

## 2020-04-25 MED ORDER — LOSARTAN POTASSIUM 100 MG PO TABS: 100.0000 mg | ORAL_TABLET | Freq: Every day | ORAL | 1 refills | Status: DC

## 2020-04-25 NOTE — Telephone Encounter (Signed)
Patient requested refills to be sent to [facility].  °

## 2020-04-30 ENCOUNTER — Encounter: Payer: Self-pay | Admitting: Podiatry

## 2020-04-30 ENCOUNTER — Other Ambulatory Visit: Payer: Self-pay

## 2020-04-30 ENCOUNTER — Ambulatory Visit (INDEPENDENT_AMBULATORY_CARE_PROVIDER_SITE_OTHER): Payer: Medicare Other | Admitting: Podiatry

## 2020-04-30 VITALS — BP 168/70 | HR 83 | Temp 97.2°F

## 2020-04-30 DIAGNOSIS — E119 Type 2 diabetes mellitus without complications: Secondary | ICD-10-CM | POA: Diagnosis not present

## 2020-04-30 DIAGNOSIS — M2042 Other hammer toe(s) (acquired), left foot: Secondary | ICD-10-CM

## 2020-04-30 DIAGNOSIS — M79674 Pain in right toe(s): Secondary | ICD-10-CM | POA: Diagnosis not present

## 2020-04-30 DIAGNOSIS — L84 Corns and callosities: Secondary | ICD-10-CM | POA: Diagnosis not present

## 2020-04-30 DIAGNOSIS — M79675 Pain in left toe(s): Secondary | ICD-10-CM | POA: Diagnosis not present

## 2020-04-30 DIAGNOSIS — M2041 Other hammer toe(s) (acquired), right foot: Secondary | ICD-10-CM | POA: Diagnosis not present

## 2020-04-30 DIAGNOSIS — B351 Tinea unguium: Secondary | ICD-10-CM | POA: Diagnosis not present

## 2020-04-30 NOTE — Patient Instructions (Addendum)
Corns and Calluses Corns are small areas of thickened skin that occur on the top, sides, or tip of a toe. They contain a cone-shaped core with a point that can press on a nerve below. This causes pain.  Calluses are areas of thickened skin that can occur anywhere on the body, including the hands, fingers, palms, soles of the feet, and heels. Calluses are usually larger than corns. What are the causes? Corns and calluses are caused by rubbing (friction) or pressure, such as from shoes that are too tight or do not fit properly. What increases the risk? Corns are more likely to develop in people who have misshapen toes (toe deformities), such as hammer toes. Calluses can occur with friction to any area of the skin. They are more likely to develop in people who:  Work with their hands.  Wear shoes that fit poorly, are too tight, or are high-heeled.  Have toe deformities. What are the signs or symptoms? Symptoms of a corn or callus include:  A hard growth on the skin.  Pain or tenderness under the skin.  Redness and swelling.  Increased discomfort while wearing tight-fitting shoes, if your feet are affected. If a corn or callus becomes infected, symptoms may include:  Redness and swelling that gets worse.  Pain.  Fluid, blood, or pus draining from the corn or callus. How is this diagnosed? Corns and calluses may be diagnosed based on your symptoms, your medical history, and a physical exam. How is this treated? Treatment for corns and calluses may include:  Removing the cause of the friction or pressure. This may involve: ? Changing your shoes. ? Wearing shoe inserts (orthotics) or other protective layers in your shoes, such as a corn pad. ? Wearing gloves.  Applying medicine to the skin (topical medicine) to help soften skin in the hardened, thickened areas.  Removing layers of dead skin with a file to reduce the size of the corn or callus.  Removing the corn or callus with a  scalpel or laser.  Taking antibiotic medicines, if your corn or callus is infected.  Having surgery, if a toe deformity is the cause. Follow these instructions at home:   Take over-the-counter and prescription medicines only as told by your health care provider.  If you were prescribed an antibiotic, take it as told by your health care provider. Do not stop taking it even if your condition starts to improve.  Wear shoes that fit well. Avoid wearing high-heeled shoes and shoes that are too tight or too loose.  Wear any padding, protective layers, gloves, or orthotics as told by your health care provider.  Soak your hands or feet and then use a file or pumice stone to soften your corn or callus. Do this as told by your health care provider.  Check your corn or callus every day for symptoms of infection. Contact a health care provider if you:  Notice that your symptoms do not improve with treatment.  Have redness or swelling that gets worse.  Notice that your corn or callus becomes painful.  Have fluid, blood, or pus coming from your corn or callus.  Have new symptoms. Summary  Corns are small areas of thickened skin that occur on the top, sides, or tip of a toe.  Calluses are areas of thickened skin that can occur anywhere on the body, including the hands, fingers, palms, and soles of the feet. Calluses are usually larger than corns.  Corns and calluses are caused by   rubbing (friction) or pressure, such as from shoes that are too tight or do not fit properly.  Treatment may include wearing any padding, protective layers, gloves, or orthotics as told by your health care provider. This information is not intended to replace advice given to you by your health care provider. Make sure you discuss any questions you have with your health care provider. Document Revised: 03/21/2019 Document Reviewed: 10/12/2017 Elsevier Patient Education  [place] Toe  Hammer  toe is a change in the shape (a deformity) of your toe. The deformity causes the middle joint of your toe to stay bent. This causes pain, especially when you are wearing shoes. Hammer toe starts gradually. At first, the toe can be straightened. Gradually over time, the deformity becomes stiff and permanent. Early treatments to keep the toe straight may relieve pain. As the deformity becomes stiff and permanent, surgery may be needed to straighten the toe. What are the causes? Hammer toe is caused by abnormal bending of the toe joint that is closest to your foot. It happens gradually over time. This pulls on the muscles and connections (tendons) of the toe joint, making them weak and stiff. It is often related to wearing shoes that are too short or narrow and do not let your toes straighten. What increases the risk? You may be at greater risk for hammer toe if you:  Are female.  Are older.  Wear shoes that are too small.  Wear high-heeled shoes that pinch your toes.  Are a Engineer, mining.  Have a second toe that is longer than your big toe (first toe).  Injure your foot or toe.  Have arthritis.  Have a family history of hammer toe.  Have a nerve or muscle disorder. What are the signs or symptoms? The main symptoms of this condition are pain and deformity of the toe. The pain is worse when wearing shoes, walking, or running. Other symptoms may include:  Corns or calluses over the bent part of the toe or between the toes.  Redness and a burning feeling on the toe.  An open sore that forms on the top of the toe.  Not being able to straighten the toe. How is this diagnosed? This condition is diagnosed based on your symptoms and a physical exam. During the exam, your health care provider will try to straighten your toe to see how stiff the deformity is. You may also have tests, such as:  A blood test to check for rheumatoid arthritis.  An X-ray to show how severe the deformity  is. How is this treated? Treatment for this condition will depend on how stiff the deformity is. Surgery is often needed. However, sometimes a hammer toe can be straightened without surgery. Treatments that do not involve surgery include:  Taping the toe into a straightened position.  Using pads and cushions to protect the toe (orthotics).  Wearing shoes that provide enough room for the toes.  Doing toe-stretching exercises at home.  Taking an NSAID to reduce pain and swelling. If these treatments do not help or the toe cannot be straightened, surgery is the next option. The most common surgeries used to straighten a hammer toe include:  Arthroplasty. In this procedure, part of the joint is removed, and that allows the toe to straighten.  Fusion. In this procedure, cartilage between the two bones of the joint is taken out and the bones are fused together into one longer bone.  Implantation. In this  procedure, part of the bone is removed and replaced with an implant to let the toe move again.  Flexor tendon transfer. In this procedure, the tendons that curl the toes down (flexor tendons) are repositioned. Follow these instructions at home:  Take over-the-counter and prescription medicines only as told by your health care provider.  Do toe straightening and stretching exercises as told by your health care provider.  Keep all follow-up visits as told by your health care provider. This is important. How is this prevented?  Wear shoes that give your toes enough room and do not cause pain.  Do not wear high-heeled shoes. Contact a health care provider if:  Your pain gets worse.  Your toe becomes red or swollen.  You develop an open sore on your toe. This information is not intended to replace advice given to you by your health care provider. Make sure you discuss any questions you have with your health care provider. Document Revised: 11/11/2017 Document Reviewed:  03/24/2016 Elsevier Patient Education  [place]  A bunion is a bump on the base of the big toe that forms when the bones of the big toe joint move out of position. Bunions may be small at first, but they often get larger over time. They can make walking painful. What are the causes? A bunion may be caused by:  Wearing narrow or pointed shoes that force the big toe to press against the other toes.  Abnormal foot development that causes the foot to roll inward (pronate).  Changes in the foot that are caused by certain diseases, such as rheumatoid arthritis or polio.  A foot injury. What increases the risk? The following factors may make you more likely to develop this condition:  Wearing shoes that squeeze the toes together.  Having certain diseases, such as: ? Rheumatoid arthritis. ? Polio. ? Cerebral palsy.  Having family members who have bunions.  Being born with a foot deformity, such as flat feet or low arches.  Doing activities that put a lot of pressure on the feet, such as ballet dancing. What are the signs or symptoms? The main symptom of a bunion is a noticeable bump on the big toe. Other symptoms may include:  Pain.  Swelling around the big toe.  Redness and inflammation.  Thick or hardened skin on the big toe or between the toes.  Stiffness or loss of motion in the big toe.  Trouble with walking. How is this diagnosed? A bunion may be diagnosed based on your symptoms, medical history, and activities. You may have tests, such as:  X-rays. These allow your health care provider to check the position of the bones in your foot and look for damage to your joint. They also help your health care provider determine the severity of your bunion and the best way to treat it.  Joint aspiration. In this test, a sample of fluid is removed from the toe joint. This test may be done if you are in a lot of pain. It helps rule out diseases that cause painful  swelling of the joints, such as arthritis. How is this treated? Treatment depends on the severity of your symptoms. The goal of treatment is to relieve symptoms and prevent the bunion from getting worse. Your health care provider may recommend:  Wearing shoes that have a wide toe box.  Using bunion pads to cushion the affected area.  Taping your toes together to keep them in a normal position.  Placing a device inside your shoe (orthotics) to help reduce pressure on your toe joint.  Taking medicine to ease pain, inflammation, and swelling.  Applying heat or ice to the affected area.  Doing stretching exercises.  Surgery to remove scar tissue and move the toes back into their normal position. This treatment is rare. Follow these instructions at home: Managing pain, stiffness, and swelling   If directed, put ice on the painful area: ? Put ice in a plastic bag. ? Place a towel between your skin and the bag. ? Leave the ice on for 20 minutes, 2-3 times a day. Activity   If directed, apply heat to the affected area before you exercise. Use the heat source that your health care provider recommends, such as a moist heat pack or a heating pad. ? Place a towel between your skin and the heat source. ? Leave the heat on for 20-30 minutes. ? Remove the heat if your skin turns bright red. This is especially important if you are unable to feel pain, heat, or cold. You may have a greater risk of getting burned.  Do exercises as told by your health care provider. General instructions  Support your toe joint with proper footwear, shoe padding, or taping as told by your health care provider.  Take over-the-counter and prescription medicines only as told by your health care provider.  Keep all follow-up visits as told by your health care provider. This is important. Contact a health care provider if your symptoms:  Get worse.  Do not improve in 2 weeks. Get help right away if you  have:  Severe pain and trouble with walking. Summary  A bunion is a bump on the base of the big toe that forms when the bones of the big toe joint move out of position.  Bunions can make walking painful.  Treatment depends on the severity of your symptoms.  Support your toe joint with proper footwear, shoe padding, or taping as told by your health care provider. This information is not intended to replace advice given to you by your health care provider. Make sure you discuss any questions you have with your health care provider. Document Revised: 06/05/2018 Document Reviewed: 04/11/2018 Elsevier Patient Education  [place].  Diabetes Mellitus and Barney care is an important part of your health, especially when you have diabetes. Diabetes may cause you to have problems because of poor blood flow (circulation) to your feet and legs, which can cause your skin to:  Become thinner and drier.  Break more easily.  Heal more slowly.  Peel and crack. You may also have nerve damage (neuropathy) in your legs and feet, causing decreased feeling in them. This means that you may not notice minor injuries to your feet that could lead to more serious problems. Noticing and addressing any potential problems early is the best way to prevent future foot problems. How to care for your feet Foot hygiene  Wash your feet daily with warm water and mild soap. Do not use hot water. Then, pat your feet and the areas between your toes until they are completely dry. Do not soak your feet as this can dry your skin.  Trim your toenails straight across. Do not dig under them or around the cuticle. File the edges of your nails with an emery board or nail file.  Apply a moisturizing lotion or petroleum jelly to the skin on your feet and to dry, brittle toenails. Use lotion  that does not contain alcohol and is unscented. Do not apply lotion between your toes. Shoes and socks  Wear clean socks or  stockings every day. Make sure they are not too tight. Do not wear knee-high stockings since they may decrease blood flow to your legs.  Wear shoes that fit properly and have enough cushioning. Always look in your shoes before you put them on to be sure there are no objects inside.  To break in new shoes, wear them for just a few hours a day. This prevents injuries on your feet. Wounds, scrapes, corns, and calluses  Check your feet daily for blisters, cuts, bruises, sores, and redness. If you cannot see the bottom of your feet, use a mirror or ask someone for help.  Do not cut corns or calluses or try to remove them with medicine.  If you find a minor scrape, cut, or break in the skin on your feet, keep it and the skin around it clean and dry. You may clean these areas with mild soap and water. Do not clean the area with peroxide, alcohol, or iodine.  If you have a wound, scrape, corn, or callus on your foot, look at it several times a day to make sure it is healing and not infected. Check for: ? Redness, swelling, or pain. ? Fluid or blood. ? Warmth. ? Pus or a bad smell. General instructions  Do not cross your legs. This may decrease blood flow to your feet.  Do not use heating pads or hot water bottles on your feet. They may burn your skin. If you have lost feeling in your feet or legs, you may not know this is happening until it is too late.  Protect your feet from hot and cold by wearing shoes, such as at the beach or on hot pavement.  Schedule a complete foot exam at least once a year (annually) or more often if you have foot problems. If you have foot problems, report any cuts, sores, or bruises to your health care provider immediately. Contact a health care provider if:  You have a medical condition that increases your risk of infection and you have any cuts, sores, or bruises on your feet.  You have an injury that is not healing.  You have redness on your legs or  feet.  You feel burning or tingling in your legs or feet.  You have pain or cramps in your legs and feet.  Your legs or feet are numb.  Your feet always feel cold.  You have pain around a toenail. Get help right away if:  You have a wound, scrape, corn, or callus on your foot and: ? You have pain, swelling, or redness that gets worse. ? You have fluid or blood coming from the wound, scrape, corn, or callus. ? Your wound, scrape, corn, or callus feels warm to the touch. ? You have pus or a bad smell coming from the wound, scrape, corn, or callus. ? You have a fever. ? You have a red line going up your leg. Summary  Check your feet every day for cuts, sores, red spots, swelling, and blisters.  Moisturize feet and legs daily.  Wear shoes that fit properly and have enough cushioning.  If you have foot problems, report any cuts, sores, or bruises to your health care provider immediately.  Schedule a complete foot exam at least once a year (annually) or more often if you have foot problems. This information is not  intended to replace advice given to you by your health care provider. Make sure you discuss any questions you have with your health care provider. Document Revised: 08/22/2019 Document Reviewed: 12/31/2016 Elsevier Patient Education  [place]? An infection that lies within the keratin of your nail plate that is caused by a fungus.  WHY ME? Fungal infections affect all ages, sexes, races, and creeds.  There may be many factors that predispose you to a fungal infection such as age, coexisting medical conditions such as diabetes, or an autoimmune disease; stress, medications, fatigue, genetics, etc.  Bottom line: fungus thrives in a warm, moist environment and your shoes offer such a location.  IS IT CONTAGIOUS? Theoretically, yes.  You do not want to share shoes, nail clippers or files with someone who has fungal  toenails.  Walking around barefoot in the same room or sleeping in the same bed is unlikely to transfer the organism.  It is important to realize, however, that fungus can spread easily from one nail to the next on the same foot.  HOW DO WE TREAT THIS?  There are several ways to treat this condition.  Treatment may depend on many factors such as age, medications, pregnancy, liver and kidney conditions, etc.  It is best to ask your doctor which options are available to you.  12. No treatment.   Unlike many other medical concerns, you can live with this condition.  However for many people this can be a painful condition and may lead to ingrown toenails or a bacterial infection.  It is recommended that you keep the nails cut short to help reduce the amount of fungal nail. 13. Topical treatment.  These range from herbal remedies to prescription strength nail lacquers.  About 40-50% effective, topicals require twice daily application for approximately 9 to 12 months or until an entirely new nail has grown out.  The most effective topicals are medical grade medications available through physicians offices. 14. Oral antifungal medications.  With an 80-90% cure rate, the most common oral medication requires 3 to 4 months of therapy and stays in your system for a year as the new nail grows out.  Oral antifungal medications do require blood work to make sure it is a safe drug for you.  A liver function panel will be performed prior to starting the medication and after the first month of treatment.  It is important to have the blood work performed to avoid any harmful side effects.  In general, this medication safe but blood work is required. 15. Laser Therapy.  This treatment is performed by applying a specialized laser to the affected nail plate.  This therapy is noninvasive, fast, and non-painful.  It is not covered by insurance and is therefore, out of pocket.  The results have been very good with a 80-95% cure  rate.  [facility] is the only practice in the area to offer this therapy. 16. Permanent Nail Avulsion.  Removing the entire nail so that a new nail will not grow back.

## 2020-05-07 NOTE — Progress Notes (Signed)
Subjective: Tanya Bell presents today referred by Ngetich, Nelda Bucks, NP for diabetic foot evaluation.  Patient relates 10-15 year history of diabetes.  Patient denies any history of foot wounds.  Patient relates h/o tingling of feet on occasion.   Today, patient c/o longstanding painful, discolored, thick toenails which interfere with daily activities.  Pain is aggravated when wearing enclosed shoe gear. They have not attempted treatment due to her being diabetic.  Pt also relates painful plantar calluses b/l feet for "years". She has tried [employer], but is now unable to manage and also apprehensive since she is diabetic.  She relates h/o foreign body removal (nail) from foot about 15 years ago.  Her husband is present during today's visit.  Patient states she did not take blood pressure medicine this morning. Denies any headache or dizziness this morning.  Past Medical History:  Diagnosis Date  . Acute upper respiratory infections of unspecified site   . Acute upper respiratory infections of unspecified site   . Acute upper respiratory infections of unspecified site   . Allergic rhinitis due to pollen   . Candidiasis of mouth   . Disorder of bone and cartilage, unspecified   . Diverticulitis of colon (without mention of hemorrhage)(562.11)   . Essential hypertension, benign   . Essential hypertension, benign   . Extrinsic asthma, unspecified   . Herpes simplex with other ophthalmic complications   . Other abnormal glucose   . Other and unspecified hyperlipidemia   . Other and unspecified hyperlipidemia   . Other and unspecified hyperlipidemia   . Other cataract   . Other cataract   . Other dystrophy of vulva   . Other malaise and fatigue   . Rash and other nonspecific skin eruption   . Reflux esophagitis   . Thrombocytopenia, unspecified ([place])   . Type II or unspecified type diabetes mellitus without mention of complication, not stated as uncontrolled    . Type II or unspecified type diabetes mellitus without mention of complication, not stated as uncontrolled   . Unspecified disorder of skin and subcutaneous tissue   . Unspecified essential hypertension   . Unspecified essential hypertension     Patient Active Problem List   Diagnosis Date Noted  . Diabetes mellitus type 2, controlled ([place]) 02/04/2015  . Varicose veins of leg with edema 02/04/2015  . Unspecified essential hypertension 05/07/2014  . Folliculitis [id number]  . HTN (hypertension) 03/06/2013  . Vaginal dryness, menopausal 03/06/2013  . Eczema 03/06/2013  . Annual physical exam 03/06/2013  . Herpes simplex with other ophthalmic complications 0000000  . Disorder of bone and cartilage, unspecified 01/04/2013  . Reflux esophagitis 01/04/2013  . Candidiasis of mouth 01/04/2013  . Other cataract 01/04/2013  . Acute upper respiratory infections of unspecified site 01/04/2013  . Sickle cell anemia ([place]) 01/04/2013  . Thrombocytopenia ([place]) 01/04/2013  . Other dystrophy of vulva 01/04/2013  . Diverticulitis of colon (without mention of hemorrhage)(562.11) 01/04/2013  . Unspecified disorder of skin and subcutaneous tissue 01/04/2013  . Rash and other nonspecific skin eruption 01/04/2013  . Type II or unspecified type diabetes mellitus without mention of complication, not stated as uncontrolled 01/04/2013  . Hyperlipidemia LDL goal <100 01/04/2013  . Seasonal allergic rhinitis due to pollen 01/04/2013  . Extrinsic asthma, unspecified asthma severity, uncomplicated 0000000  . Other malaise and fatigue 01/04/2013  . Other abnormal glucose 01/04/2013    Past Surgical History:  Procedure Laterality Date  . ABDOMINAL HYSTERECTOMY    .  CESAREAN SECTION     [phone]  . CYST EXCISION  1974   Pilonidal Cyst excision  . EYE SURGERY Right 04/14/2017   Dr. Zadie Rhine  . KNEE ARTHROSCOPY Right    for meniscal tear  . RETINAL TEAR REPAIR CRYOTHERAPY  10/21/2017    Getting injections in right eye    Current Outpatient Medications on File Prior to Visit  Medication Sig Dispense Refill  . acetaminophen (TYLENOL) 325 MG tablet Take 325 mg by mouth daily as needed. For pain    . albuterol (VENTOLIN HFA) 108 (90 Base) MCG/ACT inhaler Inhale 2 puffs into the lungs every 6 (six) hours as needed for wheezing or shortness of breath. 18 g 1  . amLODipine (NORVASC) 5 MG tablet Take 1 tablet (5 mg total) by mouth daily. 90 tablet 1  . budesonide-formoterol (SYMBICORT) 160-4.5 MCG/ACT inhaler USE 2 INHALATIONS TWICE DAILY 10.2 Inhaler 0  . calcium-vitamin D (OSCAL WITH D) 500-200 MG-UNIT per tablet Take 1 tablet by mouth 2 (two) times daily. 60 tablet 0  . carvedilol (COREG) 12.5 MG tablet Take one tablet by mouth once daily for blood pressure 90 tablet 1  . ELDERBERRY PO Take by mouth 3 (three) times a week.     . ezetimibe (ZETIA) 10 MG tablet Take 1 tablet (10 mg total) by mouth daily. 90 tablet 1  . fexofenadine (ALLEGRA) 180 MG tablet Take 180 mg by mouth as needed.     Marland Kitchen KETOROLAC TROMETHAMINE OP Apply 1 drop to eye 4 (four) times daily. Right eye    . losartan (COZAAR) 100 MG tablet Take 1 tablet (100 mg total) by mouth daily. 90 tablet 1  . Magnesium Oxide 500 MG CAPS Take 2 capsules by mouth at bedtime.    . metFORMIN (GLUCOPHAGE) 500 MG tablet Take 1 tablet (500 mg total) by mouth daily with breakfast. 90 tablet 1  . mometasone (NASONEX) 50 MCG/ACT nasal spray Place 1 spray into the nose 2 (two) times daily as needed. 51 g 3  . prednisoLONE acetate (PRED FORTE) 1 % ophthalmic suspension Place 1 drop into the right eye 4 (four) times daily.     . psyllium (REGULOID) 0.52 g capsule Take 0.52 g by mouth daily.    . rosuvastatin (CRESTOR) 20 MG tablet Take one tablet three times a week, Monday, Wednesday and Friday 36 tablet 3  . simethicone (GAS-X) 80 MG chewable tablet Chew 1 tablet (80 mg total) by mouth every 6 (six) hours as needed for flatulence. 30 tablet  0  . sodium chloride (OCEAN) 0.65 % SOLN nasal spray Place 1 spray into both nostrils as needed for congestion. 30 mL 0   No current facility-administered medications on file prior to visit.     Allergies  Allergen Reactions  . Crestor [Rosuvastatin Calcium]     Muscle cramps  . Latex Other (See Comments)    GLOVES  . Sulfa Antibiotics     Social History   Occupational History  . Not on file  Tobacco Use  . Smoking status: Former Smoker    Types: Cigarettes    Quit date: 12/13/1990    Years since quitting: 29.4  . Smokeless tobacco: Never Used  Substance and Sexual Activity  . Alcohol use: No    Alcohol/week: 0.0 standard drinks  . Drug use: No  . Sexual activity: Yes    Partners: Male    Comment: None     Family History  Problem Relation Age of Onset  .  Cancer Mother        brain  . Stroke Father     Immunization History  Administered Date(s) Administered  . Fluad Quad(high Dose 65+) 08/29/2019  . Influenza Split 08/25/2010, 08/26/2011, 10/04/2012  . Influenza Whole 09/24/2009  . Influenza, High Dose Seasonal PF 08/16/2018  . Influenza,inj,Quad PF,6+ Mos 09/11/2013, 09/04/2014, 11/12/2015, 10/04/2016, 09/06/2017  . Influenza-Unspecified 09/02/2018  . PFIZER SARS-COV-2 Vaccination 02/11/2020, 03/03/2020  . Pneumococcal Conjugate-13 02/17/2012  . Pneumococcal Polysaccharide-23 05/19/2016  . Tdap 02/17/2012    Review of systems: Positive Findings in bold print.  Constitutional:  chills, fatigue, fever, sweats, weight change Communication: Optometrist, sign Ecologist, hand writing, iPad/Android device Head: headaches, head injury Eyes: changes in vision, eye pain, glaucoma, cataracts, macular degeneration, diplopia, glare,  light sensitivity, eyeglasses or contacts, blindness Ears nose mouth throat: hearing impaired, hearing aids,  ringing in ears, deaf, sign language,  vertigo, nosebleeds,  rhinitis,  cold sores, snoring, swollen  glands Cardiovascular: HTN, edema, arrhythmia, pacemaker in place, defibrillator in place, chest pain/tightness, chronic anticoagulation, blood clot, heart failure, MI Peripheral Vascular: leg cramps, varicose veins, blood clots, lymphedema, varicosities Respiratory:  difficulty breathing, denies congestion, SOB, wheezing, cough, emphysema, asthma Gastrointestinal: change in appetite or weight, abdominal pain, constipation, diarrhea, nausea, vomiting, vomiting blood, change in bowel habits, abdominal pain, jaundice, rectal bleeding, hemorrhoids, GERD Genitourinary:  nocturia,  pain on urination, polyuria,  blood in urine, Foley catheter, urinary urgency, ESRD on hemodialysis Musculoskeletal: amputation, cramping, stiff joints, painful joints, decreased joint motion, fractures, OA, gout, hemiplegia, paraplegia, uses cane, wheelchair bound, uses walker, uses rollator Skin: +changes in toenails, color change, dryness, itching, mole changes,  rash, wound(s) Neurological: headaches, numbness in feet, paresthesias in feet, burning in feet, fainting,  seizures, change in speech,  headaches, memory problems/poor historian, cerebral palsy, weakness, paralysis, CVA, TIA Endocrine: diabetes, hypothyroidism, hyperthyroidism,  goiter, dry mouth, flushing, heat intolerance,  cold intolerance,  excessive thirst, denies polyuria,  nocturia Hematological:  easy bleeding, excessive bleeding, easy bruising, enlarged lymph nodes, on long term blood thinner, history of past transusions Allergy/immunological:  hives, eczema, frequent infections, multiple drug allergies, seasonal allergies, transplant recipient, multiple food allergies Psychiatric:  anxiety, depression, mood disorder, suicidal ideations, hallucinations, insomnia  Objective: Vitals:   04/30/20 0853  BP: (!) 168/70  Pulse: 83  Temp: (!) 97.2 F (36.2 C)    79 y.o. pleasant AA female WD, WN IN NAD. AAO X 3.  Vascular Examination: Capillary refill  time to digits immediate b/l. Palpable DP pulses b/l. Palpable PT pulses b/l. Pedal hair sparse b/l. Skin temperature gradient within normal limits b/l. Varicosities present b/l.  Dermatological Examination: Pedal skin with normal turgor, texture and tone bilaterally. No open wounds bilaterally. No interdigital macerations bilaterally. Toenails 1-5 b/l elongated, dystrophic, thickened, crumbly with subungual debris and tenderness to dorsal palpation. Hyperkeratotic lesion(s) submet head 1 left foot, submet head 1 right foot, submet head 5 left foot and submet head 5 right foot.  No erythema, no edema, no drainage, no flocculence.  Musculoskeletal Examination: Normal muscle strength 5/5 to all lower extremity muscle groups bilaterally. No pain crepitus or joint limitation noted with ROM b/l. Hallux valgus with bunion deformity noted b/l. Hammertoes noted to the L 5th toe and R 5th toe.  Neurological Examination: Protective sensation intact 5/5 intact bilaterally with 10g monofilament b/l. Vibratory sensation intact b/l. Proprioception intact bilaterally. Achilles reflex 2+ b/l. Clonus negative b/l.  Assessment: 1. Pain due to onychomycosis of toenails of both feet   2.  Callus   3. Controlled type 2 diabetes mellitus without complication, without long-term current use of insulin ([place])   4. Acquired hammertoes of both feet   5. Encounter for diabetic foot exam ([place])    Plan: -Examined patient. -Diabetic foot examination performed on today's visit. -Discussed diabetic foot care principles. Literature dispensed on today. -Toenails 1-5 b/l were debrided in length and girth with sterile nail nippers and dremel without iatrogenic bleeding.  -Callus(es) submet head 1 left foot, submet head 1 right foot, submet head 5 left foot and submet head 5 right foot pared utilizing sterile scalpel blade without complication or incident. Total number debrided =4. -Patient to continue soft, supportive shoe gear  daily. Start procedure for diabetic shoes. Patient qualifies based on diagnoses. -Patient to report any pedal injuries to medical professional immediately. -Patient/POA to call should there be question/concern in the interim.  Return in about 3 months (around 07/31/2020) for diabetic nail trim.

## 2020-05-15 DIAGNOSIS — E119 Type 2 diabetes mellitus without complications: Secondary | ICD-10-CM | POA: Diagnosis not present

## 2020-05-15 DIAGNOSIS — H3521 Other non-diabetic proliferative retinopathy, right eye: Secondary | ICD-10-CM | POA: Diagnosis not present

## 2020-05-15 DIAGNOSIS — H35351 Cystoid macular degeneration, right eye: Secondary | ICD-10-CM | POA: Diagnosis not present

## 2020-05-15 DIAGNOSIS — H35372 Puckering of macula, left eye: Secondary | ICD-10-CM | POA: Diagnosis not present

## 2020-05-15 DIAGNOSIS — D573 Sickle-cell trait: Secondary | ICD-10-CM | POA: Diagnosis not present

## 2020-05-15 DIAGNOSIS — Z8669 Personal history of other diseases of the nervous system and sense organs: Secondary | ICD-10-CM | POA: Diagnosis not present

## 2020-05-15 DIAGNOSIS — H35371 Puckering of macula, right eye: Secondary | ICD-10-CM | POA: Diagnosis not present

## 2020-05-20 ENCOUNTER — Other Ambulatory Visit: Payer: Self-pay

## 2020-05-20 ENCOUNTER — Ambulatory Visit: Payer: Medicare Other | Admitting: Orthotics

## 2020-05-20 DIAGNOSIS — L84 Corns and callosities: Secondary | ICD-10-CM

## 2020-05-20 DIAGNOSIS — M79674 Pain in right toe(s): Secondary | ICD-10-CM

## 2020-05-20 NOTE — Progress Notes (Signed)
Being seen by NP; however cast and p/out shoes.

## 2020-05-21 ENCOUNTER — Telehealth: Payer: Self-pay | Admitting: Podiatry

## 2020-05-21 NOTE — Telephone Encounter (Signed)
Pt called about diabetic shoe paperwork. She seen Liliane Channel yesterday and called the office where she is seen by the NP and they said the doctor would sign off on the paperwork.  I explained that per medicare guidelines pt has to be seen by the md/do within the last 6 months and the office note must be attached to the diabetic shoe paperwork. I explained that it could be a virtual visit if that helps. She is going to call the office and get an appt.

## 2020-05-22 ENCOUNTER — Other Ambulatory Visit: Payer: Self-pay

## 2020-05-22 ENCOUNTER — Ambulatory Visit (INDEPENDENT_AMBULATORY_CARE_PROVIDER_SITE_OTHER): Payer: Medicare Other | Admitting: Family

## 2020-05-22 ENCOUNTER — Telehealth: Payer: Self-pay | Admitting: Podiatry

## 2020-05-22 ENCOUNTER — Encounter: Payer: Self-pay | Admitting: Family

## 2020-05-22 VITALS — BP 130/80 | HR 71 | Temp 96.9°F | Resp 16 | Ht 61.0 in | Wt 156.8 lb

## 2020-05-22 DIAGNOSIS — E119 Type 2 diabetes mellitus without complications: Secondary | ICD-10-CM

## 2020-05-22 MED ORDER — SITAGLIPTIN PHOSPHATE 25 MG PO TABS: 25.0000 mg | ORAL_TABLET | Freq: Every day | ORAL | 0 refills | Status: DC

## 2020-05-22 NOTE — Patient Instructions (Signed)
Sitagliptin oral tablet What is this medicine? SITAGLIPTIN (sit a GLIP tin) helps to treat type 2 diabetes. It helps to control blood sugar. Treatment is combined with diet and exercise. This medicine may be used for other purposes; ask your health care provider or pharmacist if you have questions. COMMON BRAND NAME(S): Januvia What should I tell my health care provider before I take this medicine? They need to know if you have any of these conditions:  diabetic ketoacidosis  kidney disease  pancreatitis  previous swelling of the tongue, face, or lips with difficulty breathing, difficulty swallowing, hoarseness, or tightening of the throat  type 1 diabetes  an unusual or allergic reaction to sitagliptin, other medicines, foods, dyes, or preservatives  pregnant or trying to get pregnant  breast-feeding How should I use this medicine? Take this medicine by mouth with a glass of water. Follow the directions on the prescription label. You can take it with or without food. Do not cut, crush or chew this medicine. Take your dose at the same time each day. Do not take more often than directed. Do not stop taking except on your doctor's advice. A special MedGuide will be given to you by the pharmacist with each prescription and refill. Be sure to read this information carefully each time. Talk to your pediatrician regarding the use of this medicine in children. Special care may be needed. Overdosage: If you think you have taken too much of this medicine contact a poison control center or emergency room at once. NOTE: This medicine is only for you. Do not share this medicine with others. What if I miss a dose? If you miss a dose, take it as soon as you can. If it is almost time for your next dose, take only that dose. Do not take double or extra doses. What may interact with this medicine? Do not take this medicine with any of the following medications:  gatifloxacin This medicine may also  interact with the following medications:  alcohol  digoxin  insulin  sulfonylureas like glimepiride, glipizide, glyburide This list may not describe all possible interactions. Give your health care provider a list of all the medicines, herbs, non-prescription drugs, or dietary supplements you use. Also tell them if you smoke, drink alcohol, or use illegal drugs. Some items may interact with your medicine. What should I watch for while using this medicine? Visit your doctor or health care professional for regular checks on your progress. A test called the HbA1C (A1C) will be monitored. This is a simple blood test. It measures your blood sugar control over the last 2 to 3 months. You will receive this test every 3 to 6 months. Learn how to check your blood sugar. Learn the symptoms of low and high blood sugar and how to manage them. Always carry a quick-source of sugar with you in case you have symptoms of low blood sugar. Examples include hard sugar candy or glucose tablets. Make sure others know that you can choke if you eat or drink when you develop serious symptoms of low blood sugar, such as seizures or unconsciousness. They must get medical help at once. Tell your doctor or health care professional if you have high blood sugar. You might need to change the dose of your medicine. If you are sick or exercising more than usual, you might need to change the dose of your medicine. Do not skip meals. Ask your doctor or health care professional if you should avoid alcohol. Many nonprescription   cough and cold products contain sugar or alcohol. These can affect blood sugar. Wear a medical ID bracelet or chain, and carry a card that describes your disease and details of your medicine and dosage times. What side effects may I notice from receiving this medicine? Side effects that you should report to your doctor or health care professional as soon as possible:  allergic reactions like skin rash,  itching or hives, swelling of the face, lips, or tongue  breathing problems  general ill feeling or flu-like symptoms  joint pain  loss of appetite  redness, blistering, peeling or loosening of the skin, including inside the mouth  signs and symptoms of heart failure like breathing problems, fast, irregular heartbeat, sudden weight gain; swelling of the ankles, feet, hands; unusually weak or tired  signs and symptoms of low blood sugar such as feeling anxious, confusion, dizziness, increased hunger, unusually weak or tired, sweating, shakiness, cold, irritable, headache, blurred vision, fast heartbeat, loss of consciousness  signs and symptoms of muscle injury like dark urine; trouble passing urine or change in the amount of urine; unusually weak or tired; muscle pain; back pain  unusual stomach upset or pain  vomiting Side effects that usually do not require medical attention (report to your doctor or health care professional if they continue or are bothersome):  diarrhea  headache  sore throat  stomach upset  stuffy or runny nose This list may not describe all possible side effects. Call your doctor for medical advice about side effects. You may report side effects to FDA at 1-800-FDA-1088. Where should I keep my medicine? Keep out of the reach of children. Store at room temperature between 15 and 30 degrees C (59 and 86 degrees F). Throw away any unused medicine after the expiration date. NOTE: This sheet is a summary. It may not cover all possible information. If you have questions about this medicine, talk to your doctor, pharmacist, or health care provider.  2020 Elsevier/Gold Standard (2018-07-04 16:38:43)  

## 2020-05-22 NOTE — Progress Notes (Signed)
Provider: Marlowe Sax FNP-C  Segundo Makela, Nelda Bucks, NP  Patient Care Team: Iyauna Sing, Nelda Bucks, NP as PCP - General (Family Medicine) Teena Irani, MD (Inactive) as Consulting Physician (Gastroenterology) Warden Fillers, MD as Consulting Physician (Ophthalmology) [name], MD as Consulting Physician (Ophthalmology) Mickel Fuchs, MD as Referring Physician (Psychiatry)  Extended Emergency Contact Information Primary Emergency Contact: [place] Phone: [phone] Relation: None  Code Status:Full Code  Goals of care: Advanced Directive information Advanced Directives 05/22/2020  Does Patient Have a Medical Advance Directive? Yes  Type of Advance Directive Out of facility DNR (pink MOST or yellow form)  Does patient want to make changes to medical advance directive? No - Patient declined  Would patient like information on creating a medical advance directive? -     Chief Complaint  Patient presents with  . Acute Visit    Complains of metformin not working.    HPI:  Pt is a 79 y.o. female seen today for an acute visit for evaluation of Type 2 diabetes medication.she states metformin not working.she would like to get off Metformin thinks it's not working and giving her gas,cramping and rectal spasm.States CBG readings are in the 100's-200's usually runs in the 100's.Has not changed her diet and denies any weight gain.she denies any symptoms of hypo/hyperglycemia.  Past Medical History:  Diagnosis Date  . Acute upper respiratory infections of unspecified site   . Acute upper respiratory infections of unspecified site   . Acute upper respiratory infections of unspecified site   . Allergic rhinitis due to pollen   . Candidiasis of mouth   . Disorder of bone and cartilage, unspecified   . Diverticulitis of colon (without mention of hemorrhage)(562.11)   . Essential hypertension, benign   . Essential hypertension, benign   . Extrinsic asthma, unspecified   . Herpes  simplex with other ophthalmic complications   . Other abnormal glucose   . Other and unspecified hyperlipidemia   . Other and unspecified hyperlipidemia   . Other and unspecified hyperlipidemia   . Other cataract   . Other cataract   . Other dystrophy of vulva   . Other malaise and fatigue   . Rash and other nonspecific skin eruption   . Reflux esophagitis   . Thrombocytopenia, unspecified ([place])   . Type II or unspecified type diabetes mellitus without mention of complication, not stated as uncontrolled   . Type II or unspecified type diabetes mellitus without mention of complication, not stated as uncontrolled   . Unspecified disorder of skin and subcutaneous tissue   . Unspecified essential hypertension   . Unspecified essential hypertension    Past Surgical History:  Procedure Laterality Date  . ABDOMINAL HYSTERECTOMY    . CESAREAN SECTION     [phone]  . CYST EXCISION  1974   Pilonidal Cyst excision  . EYE SURGERY Right 04/14/2017   Dr. Zadie Rhine  . KNEE ARTHROSCOPY Right    for meniscal tear  . RETINAL TEAR REPAIR CRYOTHERAPY  10/21/2017   Getting injections in right eye    Allergies  Allergen Reactions  . Crestor [Rosuvastatin Calcium]     Muscle cramps  . Latex Other (See Comments)    GLOVES  . Sulfa Antibiotics     Outpatient Encounter Medications as of 05/22/2020  Medication Sig  . acetaminophen (TYLENOL) 325 MG tablet Take 325 mg by mouth daily as needed. For pain  . albuterol (VENTOLIN HFA) 108 (90 Base) MCG/ACT inhaler Inhale 2 puffs into the lungs every  6 (six) hours as needed for wheezing or shortness of breath.  Marland Kitchen amLODipine (NORVASC) 5 MG tablet Take 1 tablet (5 mg total) by mouth daily.  . budesonide-formoterol (SYMBICORT) 160-4.5 MCG/ACT inhaler USE 2 INHALATIONS TWICE DAILY  . calcium-vitamin D (OSCAL WITH D) 500-200 MG-UNIT per tablet Take 1 tablet by mouth 2 (two) times daily.  . carvedilol (COREG) 12.5 MG tablet Take one tablet by mouth  once daily for blood pressure  . ELDERBERRY PO Take by mouth 3 (three) times a week.   . ezetimibe (ZETIA) 10 MG tablet Take 1 tablet (10 mg total) by mouth daily.  . fexofenadine (ALLEGRA) 180 MG tablet Take 180 mg by mouth as needed.   Marland Kitchen KETOROLAC TROMETHAMINE OP Apply 1 drop to eye 4 (four) times daily. Right eye  . losartan (COZAAR) 100 MG tablet Take 1 tablet (100 mg total) by mouth daily.  . Magnesium Oxide 500 MG CAPS Take 2 capsules by mouth at bedtime.  . metFORMIN (GLUCOPHAGE) 500 MG tablet Take 1 tablet (500 mg total) by mouth daily with breakfast.  . mometasone (NASONEX) 50 MCG/ACT nasal spray Place 1 spray into the nose 2 (two) times daily as needed.  . prednisoLONE acetate (PRED FORTE) 1 % ophthalmic suspension Place 1 drop into the right eye 4 (four) times daily.   . rosuvastatin (CRESTOR) 20 MG tablet Take one tablet three times a week, Monday, Wednesday and Friday  . simethicone (GAS-X) 80 MG chewable tablet Chew 1 tablet (80 mg total) by mouth every 6 (six) hours as needed for flatulence.  . sodium chloride (OCEAN) 0.65 % SOLN nasal spray Place 1 spray into both nostrils as needed for congestion.  . psyllium (REGULOID) 0.52 g capsule Take 0.52 g by mouth daily.   No facility-administered encounter medications on file as of 05/22/2020.    Review of Systems  Constitutional: Negative for appetite change, chills, fatigue and fever.  Respiratory: Negative for cough, chest tightness, shortness of breath and wheezing.   Cardiovascular: Negative for chest pain, palpitations and leg swelling.  Gastrointestinal: Negative for abdominal distention, diarrhea, nausea and vomiting.       Gas cramping and constipation since starting on metformin   Musculoskeletal: Negative for arthralgias and gait problem.       Request diabetic forms to be signed states saw Podiatrist.  Skin: Negative for color change, pallor and rash.  Neurological: Negative for dizziness, speech difficulty, weakness,  light-headedness, numbness and headaches.    Immunization History  Administered Date(s) Administered  . Fluad Quad(high Dose 65+) 08/29/2019  . Influenza Split 08/25/2010, 08/26/2011, 10/04/2012  . Influenza Whole 09/24/2009  . Influenza, High Dose Seasonal PF 08/16/2018  . Influenza,inj,Quad PF,6+ Mos 09/11/2013, 09/04/2014, 11/12/2015, 10/04/2016, 09/06/2017  . Influenza-Unspecified 09/02/2018  . PFIZER SARS-COV-2 Vaccination 02/11/2020, 03/03/2020  . Pneumococcal Conjugate-13 02/17/2012  . Pneumococcal Polysaccharide-23 05/19/2016  . Tdap 02/17/2012   Pertinent  Health Maintenance Due  Topic Date Due  . INFLUENZA VACCINE  07/13/2020  . OPHTHALMOLOGY EXAM  08/14/2020  . HEMOGLOBIN A1C  08/20/2020  . FOOT EXAM  04/30/2021  . DEXA SCAN  Completed  . PNA vac Low Risk Adult  Completed   Fall Risk  05/22/2020 04/04/2020 02/21/2020 01/04/2020 10/29/2019  Falls in the past year? 0 0 0 0 0  Number falls in past yr: 0 0 0 0 0  Injury with Fall? 0 0 0 0 0    Vitals:   05/22/20 1006  BP: 130/80  Pulse: 71  Resp:  16  Temp: (!) 96.9 F (36.1 C)  SpO2: 91%  Weight: 156 lb 12.8 oz (71.1 kg)  Height: 5\' 1"  (1.549 m)   Body mass index is 29.63 kg/m. Physical Exam Vitals reviewed.  Constitutional:      General: She is not in acute distress.    Appearance: She is overweight. She is not ill-appearing.  HENT:     Head: Normocephalic.     Right Ear: Tympanic membrane, ear canal and external ear normal. There is no impacted cerumen.     Left Ear: Tympanic membrane, ear canal and external ear normal. There is no impacted cerumen.     Nose: Nose normal. No congestion.     Mouth/Throat:     Mouth: Mucous membranes are moist.     Pharynx: Oropharynx is clear. No oropharyngeal exudate or posterior oropharyngeal erythema.  Eyes:     General: No scleral icterus.       Right eye: No discharge.        Left eye: No discharge.     Conjunctiva/sclera: Conjunctivae normal.     Pupils: Pupils  are equal, round, and reactive to light.  Neck:     Vascular: No carotid bruit.  Cardiovascular:     Rate and Rhythm: Normal rate and regular rhythm.     Pulses: Normal pulses.     Heart sounds: Normal heart sounds. No murmur heard.  No friction rub. No gallop.   Pulmonary:     Effort: Pulmonary effort is normal. No respiratory distress.     Breath sounds: Normal breath sounds. No wheezing, rhonchi or rales.  Chest:     Chest wall: No tenderness.  Abdominal:     General: Bowel sounds are normal. There is no distension.     Palpations: Abdomen is soft. There is no mass.     Tenderness: There is no abdominal tenderness. There is no right CVA tenderness, left CVA tenderness, guarding or rebound.  Musculoskeletal:        General: No swelling or tenderness. Normal range of motion.     Cervical back: Normal range of motion. No rigidity or tenderness.     Right lower leg: No edema.     Left lower leg: No edema.  Lymphadenopathy:     Cervical: No cervical adenopathy.  Skin:    General: Skin is warm.     Coloration: Skin is not pale.     Findings: No bruising, erythema or rash.  Neurological:     Mental Status: She is oriented to person, place, and time.     Cranial Nerves: No cranial nerve deficit.     Sensory: No sensory deficit.     Motor: No weakness.     Gait: Gait normal.  Psychiatric:        Mood and Affect: Mood normal.        Behavior: Behavior normal. Behavior is cooperative.        Thought Content: Thought content normal.        Judgment: Judgment normal.    Labs reviewed: Recent Labs    10/22/19 0929 02/18/20 0806  NA 142 139  K 4.4 4.1  CL 106 104  CO2 28 28  GLUCOSE 109* 110*  BUN 13 14  CREATININE 0.70 0.67  CALCIUM 9.7 9.6   Recent Labs    10/22/19 0929 02/18/20 0806  AST 12 11  ALT 14 13  BILITOT 0.4 0.4  PROT 7.2 7.1   Recent Labs  10/22/19 0929 02/18/20 0806  WBC 5.2 5.7  NEUTROABS 2,106 2,058  HGB 13.5 13.5  HCT 41.7 41.0  MCV 78.7*  79.2*  PLT 187 201   Lab Results  Component Value Date   TSH 3.42 02/18/2020   Lab Results  Component Value Date   HGBA1C 6.5 (H) 02/18/2020   Lab Results  Component Value Date   CHOL 165 02/18/2020   HDL 59 02/18/2020   LDLCALC 92 02/18/2020   TRIG 57 02/18/2020   CHOLHDL 2.8 02/18/2020    Significant Diagnostic Results in last 30 days:  No results found.  Assessment/Plan  Controlled type 2 diabetes mellitus without complication, without long-term current use of insulin (HCC) Lab Results  Component Value Date   HGBA1C 6.5 (H) 02/18/2020   Request metformin to be discontinued.will start on Januvia.discussed side effects.verbalized understanding.Additional education information provided on AVS.   - Advised to check CBG ,record on log and bring to next visit. - Notify provider if CBG > 200  - sitaGLIPtin (JANUVIA) 25 MG tablet; Take 1 tablet (25 mg total) by mouth daily.  Dispense: 30 tablet; Refill: 0  -Request diabetic shoes.form to be obtained from [place] and signed by MD.seen by podiatrist 05/22/2020.  Family/ staff Communication: Reviewed plan of care with patient verbalized understanding.   Labs/tests ordered: None   Next Appointment: Has upcoming appointment 06/28/2020   Sandrea Hughs, NP

## 2020-05-22 NOTE — Telephone Encounter (Signed)
Received call from pts pcp office asking for paperwork for pts diabetic shoes. I asked if pt was seen by the doctor and if so what is the doctors name. She said pt was seen by NP and I explained as I did to the pt yesterday pt has to be seen by the md due to medicare quidelines. Pt has not been seen by the md in a while(over 6 months). I told them she can even do a virtual visit with Dr Hollace Kinnier just has to be seen by md.

## 2020-05-26 ENCOUNTER — Telehealth: Payer: Self-pay

## 2020-05-26 NOTE — Telephone Encounter (Signed)
Patient returned call and scheduled an appointment with Dr.Reed for Thursday 04/25/2020 at 8:30 am.  Patient was a little disturbed for she already had an appointment with Dinah to address diabetic shoes and is now being told she would have to have an appointment with Dr.Reed in order for paperwork to be completed.

## 2020-05-26 NOTE — Telephone Encounter (Signed)
I called patient podiatrist and asked them to fax over form for patient to have diabetic shoe papers filled out. Podiatry office states that patient has to be seen by Dr.Reed and have documentation of being seen by this doctor in order to receive diabetic shoe. Insurance will not accept doctor signing for diabetic shoe if patient was not seen by doctor. Patient cannot be seen by NP for this paper. Patient called to be notified and no answer. Voicemail was left with office call back number.

## 2020-05-29 ENCOUNTER — Other Ambulatory Visit: Payer: Self-pay

## 2020-05-29 ENCOUNTER — Encounter: Payer: Self-pay | Admitting: Internal Medicine

## 2020-05-29 ENCOUNTER — Telehealth: Payer: Self-pay

## 2020-05-29 ENCOUNTER — Ambulatory Visit (INDEPENDENT_AMBULATORY_CARE_PROVIDER_SITE_OTHER): Payer: Medicare Other | Admitting: Internal Medicine

## 2020-05-29 VITALS — BP 142/82 | HR 64 | Temp 97.1°F | Ht 61.0 in | Wt 156.0 lb

## 2020-05-29 DIAGNOSIS — E1142 Type 2 diabetes mellitus with diabetic polyneuropathy: Secondary | ICD-10-CM | POA: Diagnosis not present

## 2020-05-29 NOTE — Progress Notes (Signed)
Location:  [facility] clinic Provider:  Latrise Bowland L. Mariea Clonts, D.O., C.M.D.  Goals of Care:  Advanced Directives 05/29/2020  Does Patient Have a Medical Advance Directive? No  Type of Advance Directive -  Does patient want to make changes to medical advance directive? No - Patient declined  Would patient like information on creating a medical advance directive? -     Chief Complaint  Patient presents with  . Acute Visit    Diabetic shoe form filled out from [facility]     HPI: Patient is a 79 y.o. female seen today for diabetic shoe form.    Lab Results  Component Value Date   HGBA1C 6.5 (H) 02/18/2020  Does have some numbness and tingling of her feet.   The metformin she was taking had been working well but they stopped covering that version and she got a Risk analyst and that didn't work.  She is now on januvia--recommended taking before her evening meal.     Just started [place].  CBG this am was 124.  It's not quite regulated--up and down.  108 yesterday.  Has not had hypoglycemia.  Rides her bike.  Says she could do more.   Says she eats breakfast, sometimes not too good with lunch and then eats supper.  She's not sure what she should eat in b/w for snacks.    Due for labs 7/7.    Has zetia and crestor for cholesterol.  Lab Results  Component Value Date   CHOL 165 02/18/2020   HDL 59 02/18/2020   LDLCALC 92 02/18/2020   TRIG 57 02/18/2020   CHOLHDL 2.8 02/18/2020    Past Medical History:  Diagnosis Date  . Acute upper respiratory infections of unspecified site   . Acute upper respiratory infections of unspecified site   . Acute upper respiratory infections of unspecified site   . Allergic rhinitis due to pollen   . Candidiasis of mouth   . Disorder of bone and cartilage, unspecified   . Diverticulitis of colon (without mention of hemorrhage)(562.11)   . Essential hypertension, benign   . Essential hypertension, benign   . Extrinsic asthma,  unspecified   . Herpes simplex with other ophthalmic complications   . Other abnormal glucose   . Other and unspecified hyperlipidemia   . Other and unspecified hyperlipidemia   . Other and unspecified hyperlipidemia   . Other cataract   . Other cataract   . Other dystrophy of vulva   . Other malaise and fatigue   . Rash and other nonspecific skin eruption   . Reflux esophagitis   . Thrombocytopenia, unspecified ([place])   . Type II or unspecified type diabetes mellitus without mention of complication, not stated as uncontrolled   . Type II or unspecified type diabetes mellitus without mention of complication, not stated as uncontrolled   . Unspecified disorder of skin and subcutaneous tissue   . Unspecified essential hypertension   . Unspecified essential hypertension     Past Surgical History:  Procedure Laterality Date  . ABDOMINAL HYSTERECTOMY    . CESAREAN SECTION     [id number]  . CYST EXCISION  1974   Pilonidal Cyst excision  . EYE SURGERY Right 04/14/2017   Dr. Zadie Rhine  . KNEE ARTHROSCOPY Right    for meniscal tear  . RETINAL TEAR REPAIR CRYOTHERAPY  10/21/2017   Getting injections in right eye    Allergies  Allergen Reactions  . Crestor [Rosuvastatin Calcium]  Muscle cramps  . Latex Other (See Comments)    GLOVES  . Sulfa Antibiotics     Outpatient Encounter Medications as of 05/29/2020  Medication Sig  . acetaminophen (TYLENOL) 325 MG tablet Take 325 mg by mouth daily as needed. For pain  . albuterol (VENTOLIN HFA) 108 (90 Base) MCG/ACT inhaler Inhale 2 puffs into the lungs every 6 (six) hours as needed for wheezing or shortness of breath.  Marland Kitchen amLODipine (NORVASC) 5 MG tablet Take 1 tablet (5 mg total) by mouth daily.  . budesonide-formoterol (SYMBICORT) 160-4.5 MCG/ACT inhaler USE 2 INHALATIONS TWICE DAILY  . calcium-vitamin D (OSCAL WITH D) 500-200 MG-UNIT per tablet Take 1 tablet by mouth 2 (two) times daily.  . carvedilol (COREG) 12.5 MG tablet  Take one tablet by mouth once daily for blood pressure  . ELDERBERRY PO Take by mouth 3 (three) times a week.   . ezetimibe (ZETIA) 10 MG tablet Take 1 tablet (10 mg total) by mouth daily.  . fexofenadine (ALLEGRA) 180 MG tablet Take 180 mg by mouth as needed.   Marland Kitchen KETOROLAC TROMETHAMINE OP Apply 1 drop to eye 4 (four) times daily. Right eye  . losartan (COZAAR) 100 MG tablet Take 1 tablet (100 mg total) by mouth daily.  . Magnesium Oxide 500 MG CAPS Take 2 capsules by mouth at bedtime.  . mometasone (NASONEX) 50 MCG/ACT nasal spray Place 1 spray into the nose 2 (two) times daily as needed.  . prednisoLONE acetate (PRED FORTE) 1 % ophthalmic suspension Place 1 drop into the right eye 4 (four) times daily.   . psyllium (REGULOID) 0.52 g capsule Take 0.52 g by mouth daily.  . rosuvastatin (CRESTOR) 20 MG tablet Take one tablet three times a week, Monday, Wednesday and Friday  . simethicone (GAS-X) 80 MG chewable tablet Chew 1 tablet (80 mg total) by mouth every 6 (six) hours as needed for flatulence.  . sitaGLIPtin (JANUVIA) 25 MG tablet Take 1 tablet (25 mg total) by mouth daily.  . sodium chloride (OCEAN) 0.65 % SOLN nasal spray Place 1 spray into both nostrils as needed for congestion.   No facility-administered encounter medications on file as of 05/29/2020.    Review of Systems:  Review of Systems  Constitutional: Negative for chills, fever and malaise/fatigue.  HENT: Negative for congestion.   Eyes: Negative for blurred vision.  Respiratory: Negative for shortness of breath.   Cardiovascular: Negative for chest pain, palpitations and leg swelling.  Gastrointestinal: Negative for abdominal pain.  Musculoskeletal: Negative for falls and joint pain.  Skin: Negative for itching and rash.  Neurological: Positive for tingling and sensory change. Negative for dizziness and loss of consciousness.  Endo/Heme/Allergies:       Diabetes  Psychiatric/Behavioral: Negative for depression and memory  loss. The patient is not nervous/anxious and does not have insomnia.     Health Maintenance  Topic Date Due  . Hepatitis C Screening  Never done  . INFLUENZA VACCINE  07/13/2020  . OPHTHALMOLOGY EXAM  08/14/2020  . HEMOGLOBIN A1C  08/20/2020  . FOOT EXAM  04/30/2021  . TETANUS/TDAP  02/16/2022  . DEXA SCAN  Completed  . COVID-19 Vaccine  Completed  . PNA vac Low Risk Adult  Completed    Physical Exam: Vitals:   05/29/20 0842  BP: (!) 142/82  Pulse: 64  Temp: (!) 97.1 F (36.2 C)  TempSrc: Temporal  SpO2: 96%  Weight: 156 lb (70.8 kg)  Height: 5\' 1"  (1.549 m)   Body mass  index is 29.48 kg/m. Physical Exam Vitals reviewed.  Constitutional:      Appearance: Normal appearance.  HENT:     Head: Normocephalic and atraumatic.  Cardiovascular:     Rate and Rhythm: Normal rate and regular rhythm.     Pulses: Normal pulses.     Heart sounds: Normal heart sounds.  Pulmonary:     Effort: Pulmonary effort is normal.     Breath sounds: Normal breath sounds. No wheezing, rhonchi or rales.  Abdominal:     General: Bowel sounds are normal.  Musculoskeletal:        General: Normal range of motion.     Right lower leg: No edema.     Left lower leg: No edema.  Skin:    General: Skin is warm and dry.  Neurological:     General: No focal deficit present.     Mental Status: She is alert and oriented to person, place, and time.  Psychiatric:        Mood and Affect: Mood normal.        Behavior: Behavior normal.     Labs reviewed: Basic Metabolic Panel: Recent Labs    10/22/19 0929 02/18/20 0806  NA 142 139  K 4.4 4.1  CL 106 104  CO2 28 28  GLUCOSE 109* 110*  BUN 13 14  CREATININE 0.70 0.67  CALCIUM 9.7 9.6  TSH 1.67 3.42   Liver Function Tests: Recent Labs    10/22/19 0929 02/18/20 0806  AST 12 11  ALT 14 13  BILITOT 0.4 0.4  PROT 7.2 7.1   No results for input(s): LIPASE, AMYLASE in the last 8760 hours. No results for input(s): AMMONIA in the last 8760  hours. CBC: Recent Labs    10/22/19 0929 02/18/20 0806  WBC 5.2 5.7  NEUTROABS 2,106 2,058  HGB 13.5 13.5  HCT 41.7 41.0  MCV 78.7* 79.2*  PLT 187 201   Lipid Panel: Recent Labs    10/22/19 0929 02/18/20 0806  CHOL 131 165  HDL 53 59  LDLCALC 66 92  TRIG 50 57  CHOLHDL 2.5 2.8   Lab Results  Component Value Date   HGBA1C 6.5 (H) 02/18/2020    Procedures since last visit: No results found.  Assessment/Plan 1. Controlled type 2 diabetes mellitus with diabetic polyneuropathy, without long-term current use of insulin ([place]) - diabetic foot exam done today and form completed for diabetic shoes.   -cont januvia -counseled on diet and exercise and referred to diabetic nutrition - Ambulatory referral to diabetic education  Labs/tests ordered:  Keep scheduled lab for next month and visit with Dinah Next appt:  July  Akshat Minehart L. Tymeer Vaquera, D.O. [facility] [address], [place] [address] Cell Phone (Mon-Fri 8am-5pm):  [phone] On Call:  [phone] & follow prompts after 5pm & weekends Office Phone:  [phone] Office Fax:  [phone]

## 2020-05-29 NOTE — Telephone Encounter (Signed)
Patient in office now for appointment with Dr.Reed to meet guidelines for diabetic shoes, we do not have the form at the present moment.  I called Dawn to inquire about form for Dr.Reed to complete today to qualify patient for diabetic shoes.  Dawn states she will fax form and it had to be confirmed that patient was seeing MD or DO prior to them faxing form.  Form simply requires that patient see the MD or DO and discuss diabetes management.  Awaiting fax

## 2020-05-29 NOTE — Patient Instructions (Addendum)
Discussed snacks today:  Fruits with yogurt or peanut butter or veggies with hummus.  Avocado is also good.  If you have bread, you should do whole grain breads or keto breads.     Diabetes Mellitus and Nutrition, Adult When you have diabetes (diabetes mellitus), it is very important to have healthy eating habits because your blood sugar (glucose) levels are greatly affected by what you eat and drink. Eating healthy foods in the appropriate amounts, at about the same times every day, can help you:  Control your blood glucose.  Lower your risk of heart disease.  Improve your blood pressure.  Reach or maintain a healthy weight. Every person with diabetes is different, and each person has different needs for a meal plan. Your health care provider may recommend that you work with a diet and nutrition specialist (dietitian) to make a meal plan that is best for you. Your meal plan may vary depending on factors such as:  The calories you need.  The medicines you take.  Your weight.  Your blood glucose, blood pressure, and cholesterol levels.  Your activity level.  Other health conditions you have, such as heart or kidney disease. How do carbohydrates affect me? Carbohydrates, also called carbs, affect your blood glucose level more than any other type of food. Eating carbs naturally raises the amount of glucose in your blood. Carb counting is a method for keeping track of how many carbs you eat. Counting carbs is important to keep your blood glucose at a healthy level, especially if you use insulin or take certain oral diabetes medicines. It is important to know how many carbs you can safely have in each meal. This is different for every person. Your dietitian can help you calculate how many carbs you should have at each meal and for each snack. Foods that contain carbs include:  Bread, cereal, rice, pasta, and crackers.  Potatoes and corn.  Peas, beans, and lentils.  Milk and  yogurt.  Fruit and juice.  Desserts, such as cakes, cookies, ice cream, and candy. How does alcohol affect me? Alcohol can cause a sudden decrease in blood glucose (hypoglycemia), especially if you use insulin or take certain oral diabetes medicines. Hypoglycemia can be a life-threatening condition. Symptoms of hypoglycemia (sleepiness, dizziness, and confusion) are similar to symptoms of having too much alcohol. If your health care provider says that alcohol is safe for you, follow these guidelines:  Limit alcohol intake to no more than 1 drink per day for nonpregnant women and 2 drinks per day for men. One drink equals 12 oz of beer, 5 oz of wine, or 1 oz of hard liquor.  Do not drink on an empty stomach.  Keep yourself hydrated with water, diet soda, or unsweetened iced tea.  Keep in mind that regular soda, juice, and other mixers may contain a lot of sugar and must be counted as carbs. What are tips for following this plan?  Reading food labels  Start by checking the serving size on the "Nutrition Facts" label of packaged foods and drinks. The amount of calories, carbs, fats, and other nutrients listed on the label is based on one serving of the item. Many items contain more than one serving per package.  Check the total grams (g) of carbs in one serving. You can calculate the number of servings of carbs in one serving by dividing the total carbs by 15. For example, if a food has 30 g of total carbs, it would be  equal to 2 servings of carbs.  Check the number of grams (g) of saturated and trans fats in one serving. Choose foods that have low or no amount of these fats.  Check the number of milligrams (mg) of salt (sodium) in one serving. Most people should limit total sodium intake to less than 2,300 mg per day.  Always check the nutrition information of foods labeled as "low-fat" or "nonfat". These foods may be higher in added sugar or refined carbs and should be avoided.  Talk to  your dietitian to identify your daily goals for nutrients listed on the label. Shopping  Avoid buying canned, premade, or processed foods. These foods tend to be high in fat, sodium, and added sugar.  Shop around the outside edge of the grocery store. This includes fresh fruits and vegetables, bulk grains, fresh meats, and fresh dairy. Cooking  Use low-heat cooking methods, such as baking, instead of high-heat cooking methods like deep frying.  Cook using healthy oils, such as olive, canola, or sunflower oil.  Avoid cooking with butter, cream, or high-fat meats. Meal planning  Eat meals and snacks regularly, preferably at the same times every day. Avoid going long periods of time without eating.  Eat foods high in fiber, such as fresh fruits, vegetables, beans, and whole grains. Talk to your dietitian about how many servings of carbs you can eat at each meal.  Eat 4-6 ounces (oz) of lean protein each day, such as lean meat, chicken, fish, eggs, or tofu. One oz of lean protein is equal to: ? 1 oz of meat, chicken, or fish. ? 1 egg. ?  cup of tofu.  Eat some foods each day that contain healthy fats, such as avocado, nuts, seeds, and fish. Lifestyle  Check your blood glucose regularly.  Exercise regularly as told by your health care provider. This may include: ? 150 minutes of moderate-intensity or vigorous-intensity exercise each week. This could be brisk walking, biking, or water aerobics. ? Stretching and doing strength exercises, such as yoga or weightlifting, at least 2 times a week.  Take medicines as told by your health care provider.  Do not use any products that contain nicotine or tobacco, such as cigarettes and e-cigarettes. If you need help quitting, ask your health care provider.  Work with a Social worker or diabetes educator to identify strategies to manage stress and any emotional and social challenges. Questions to ask a health care provider  Do I need to meet with  a diabetes educator?  Do I need to meet with a dietitian?  What number can I call if I have questions?  When are the best times to check my blood glucose? Where to find more information:  American Diabetes Association: diabetes.org  Academy of Nutrition and Dietetics: www.eatright.[employer] of Diabetes and Digestive and Kidney Diseases (NIH): [employer] Summary  A healthy meal plan will help you control your blood glucose and maintain a healthy lifestyle.  Working with a diet and nutrition specialist (dietitian) can help you make a meal plan that is best for you.  Keep in mind that carbohydrates (carbs) and alcohol have immediate effects on your blood glucose levels. It is important to count carbs and to use alcohol carefully. This information is not intended to replace advice given to you by your health care provider. Make sure you discuss any questions you have with your health care provider. Document Revised: 11/11/2017 Document Reviewed: 01/03/2017 Elsevier Patient Education  2020 [employer].

## 2020-06-09 ENCOUNTER — Other Ambulatory Visit: Payer: Self-pay | Admitting: *Deleted

## 2020-06-09 ENCOUNTER — Telehealth: Payer: Self-pay | Admitting: Podiatry

## 2020-06-09 MED ORDER — ROSUVASTATIN CALCIUM 20 MG PO TABS: ORAL_TABLET | ORAL | 3 refills | Status: DC

## 2020-06-09 NOTE — Telephone Encounter (Signed)
Pt left message checking on diabetic shoes status. That she has seen Dr Mariea Clonts.  Returned call and told pt we did receive the paperwork from Dr Mariea Clonts and the inserts are in production and I will call pt when the shoes/inserts come in.

## 2020-06-09 NOTE — Telephone Encounter (Signed)
Patient requested refill.  Pended Rx and sent to [facility] for approval due to HIGH ALERT Warning.  

## 2020-06-10 ENCOUNTER — Telehealth: Payer: Self-pay

## 2020-06-10 NOTE — Telephone Encounter (Signed)
Patient takes Crestor three times per week.Okay to fill has been on medication.

## 2020-06-10 NOTE — Telephone Encounter (Signed)
I called [facility] and advised pharmacist of [facility]'s response. RX will be filled

## 2020-06-10 NOTE — Telephone Encounter (Signed)
Incoming fax received from [facility] as a results of crestor rx being sent to them yesterday.  Its noted that patient has an allergy to crestor (view allergy list) and they would like to confirm that you still would like rx filled.   Please review and advise

## 2020-06-18 ENCOUNTER — Other Ambulatory Visit: Payer: Self-pay

## 2020-06-18 ENCOUNTER — Other Ambulatory Visit: Payer: Medicare Other

## 2020-06-18 DIAGNOSIS — E785 Hyperlipidemia, unspecified: Secondary | ICD-10-CM | POA: Diagnosis not present

## 2020-06-18 DIAGNOSIS — I10 Essential (primary) hypertension: Secondary | ICD-10-CM

## 2020-06-18 DIAGNOSIS — E119 Type 2 diabetes mellitus without complications: Secondary | ICD-10-CM

## 2020-06-19 LAB — COMPLETE METABOLIC PANEL WITH GFR
AG Ratio: 1.3 (calc) (ref 1.0–2.5)
ALT: 17 U/L (ref 6–29)
AST: 14 U/L (ref 10–35)
Albumin: 4 g/dL (ref 3.6–5.1)
Alkaline phosphatase (APISO): 58 U/L (ref 37–153)
BUN: 14 mg/dL (ref 7–25)
CO2: 27 mmol/L (ref 20–32)
Calcium: 9.6 mg/dL (ref 8.6–10.4)
Chloride: 105 mmol/L (ref 98–110)
Creat: 0.68 mg/dL (ref 0.60–0.93)
GFR, Est African American: 97 mL/min/{1.73_m2} (ref >=60)
GFR, Est Non African American: 84 mL/min/{1.73_m2} (ref >=60)
Globulin: 3.2 g/dL (calc) (ref 1.9–3.7)
Glucose, Bld: 114 mg/dL — ABNORMAL HIGH (ref 65–99)
Potassium: 4.1 mmol/L (ref 3.5–5.3)
Sodium: 140 mmol/L (ref 135–146)
Total Bilirubin: 0.5 mg/dL (ref 0.2–1.2)
Total Protein: 7.2 g/dL (ref 6.1–8.1)

## 2020-06-19 LAB — CBC WITH DIFFERENTIAL/PLATELET
Absolute Monocytes: 781 cells/uL (ref 200–950)
Basophils Absolute: 37 cells/uL (ref 0–200)
Basophils Relative: 0.6 %
Eosinophils Absolute: 304 cells/uL (ref 15–500)
Eosinophils Relative: 4.9 %
HCT: 40.1 % (ref 35.0–45.0)
Hemoglobin: 13.3 g/dL (ref 11.7–15.5)
Lymphs Abs: 2790 cells/uL (ref 850–3900)
MCH: 25.9 pg — ABNORMAL LOW (ref 27.0–33.0)
MCHC: 33.2 g/dL (ref 32.0–36.0)
MCV: 78 fL — ABNORMAL LOW (ref 80.0–100.0)
MPV: 11.4 fL (ref 7.5–12.5)
Monocytes Relative: 12.6 %
Neutro Abs: 2288 cells/uL (ref 1500–7800)
Neutrophils Relative %: 36.9 %
Platelets: 152 10*3/uL (ref 140–400)
RBC: 5.14 10*6/uL — ABNORMAL HIGH (ref 3.80–5.10)
RDW: 16.3 % — ABNORMAL HIGH (ref 11.0–15.0)
Total Lymphocyte: 45 %
WBC: 6.2 10*3/uL (ref 3.8–10.8)

## 2020-06-19 LAB — HEMOGLOBIN A1C
Hgb A1c MFr Bld: 6.3 % of total Hgb — ABNORMAL HIGH (ref <5.7)
Mean Plasma Glucose: 134 (calc)
eAG (mmol/L): 7.4 (calc)

## 2020-06-19 LAB — LIPID PANEL
Cholesterol: 178 mg/dL (ref <200)
HDL: 63 mg/dL (ref >=50)
LDL Cholesterol (Calc): 101 mg/dL (calc) — ABNORMAL HIGH
Non-HDL Cholesterol (Calc): 115 mg/dL (calc) (ref <130)
Total CHOL/HDL Ratio: 2.8 (calc) (ref <5.0)
Triglycerides: 57 mg/dL (ref <150)

## 2020-06-19 LAB — TSH: TSH: 1.85 mIU/L (ref 0.40–4.50)

## 2020-06-20 ENCOUNTER — Other Ambulatory Visit: Payer: Self-pay | Admitting: Family

## 2020-06-20 ENCOUNTER — Other Ambulatory Visit: Payer: Self-pay | Admitting: Adult Health

## 2020-06-20 DIAGNOSIS — E119 Type 2 diabetes mellitus without complications: Secondary | ICD-10-CM

## 2020-06-20 MED ORDER — SITAGLIPTIN PHOSPHATE 25 MG PO TABS: 25.0000 mg | ORAL_TABLET | Freq: Every day | ORAL | 0 refills | Status: DC

## 2020-06-23 ENCOUNTER — Ambulatory Visit: Payer: Medicare Other | Admitting: Family

## 2020-06-24 ENCOUNTER — Other Ambulatory Visit: Payer: Self-pay

## 2020-06-24 ENCOUNTER — Encounter: Payer: Self-pay | Admitting: Family

## 2020-06-24 ENCOUNTER — Ambulatory Visit (INDEPENDENT_AMBULATORY_CARE_PROVIDER_SITE_OTHER): Payer: Medicare Other | Admitting: Family

## 2020-06-24 VITALS — BP 130/84 | HR 95 | Temp 97.5°F | Resp 16 | Ht 61.0 in | Wt 158.2 lb

## 2020-06-24 DIAGNOSIS — J301 Allergic rhinitis due to pollen: Secondary | ICD-10-CM

## 2020-06-24 DIAGNOSIS — E1142 Type 2 diabetes mellitus with diabetic polyneuropathy: Secondary | ICD-10-CM | POA: Diagnosis not present

## 2020-06-24 DIAGNOSIS — I10 Essential (primary) hypertension: Secondary | ICD-10-CM | POA: Diagnosis not present

## 2020-06-24 DIAGNOSIS — K219 Gastro-esophageal reflux disease without esophagitis: Secondary | ICD-10-CM

## 2020-06-24 DIAGNOSIS — E785 Hyperlipidemia, unspecified: Secondary | ICD-10-CM

## 2020-06-24 MED ORDER — METFORMIN HCL 500 MG PO TABS: 500.0000 mg | ORAL_TABLET | Freq: Two times a day (BID) | ORAL | 3 refills | Status: DC

## 2020-06-24 NOTE — Patient Instructions (Addendum)
- Check Blood sugars daily and Notify provider for blood sugars less than 75 or > 150  - Restart Metformin 500 mg tablet one by mouth twice daily  - Stop taking Junuvia  25 mg tablet as requesting due to itching and body aches DASH Eating Plan DASH stands for "Dietary Approaches to Stop Hypertension." The DASH eating plan is a healthy eating plan that has been shown to reduce high blood pressure (hypertension). It may also reduce your risk for type 2 diabetes, heart disease, and stroke. The DASH eating plan may also help with weight loss. What are tips for following this plan?  General guidelines  Avoid eating more than 2,300 mg (milligrams) of salt (sodium) a day. If you have hypertension, you may need to reduce your sodium intake to 1,500 mg a day.  Limit alcohol intake to no more than 1 drink a day for nonpregnant women and 2 drinks a day for men. One drink equals 12 oz of beer, 5 oz of wine, or 1 oz of hard liquor.  Work with your health care provider to maintain a healthy body weight or to lose weight. Ask what an ideal weight is for you.  Get at least 30 minutes of exercise that causes your heart to beat faster (aerobic exercise) most days of the week. Activities may include walking, swimming, or biking.  Work with your health care provider or diet and nutrition specialist (dietitian) to adjust your eating plan to your individual calorie needs. Reading food labels   Check food labels for the amount of sodium per serving. Choose foods with less than 5 percent of the Daily Value of sodium. Generally, foods with less than 300 mg of sodium per serving fit into this eating plan.  To find whole grains, look for the word "whole" as the first word in the ingredient list. Shopping  Buy products labeled as "low-sodium" or "no salt added."  Buy fresh foods. Avoid canned foods and premade or frozen meals. Cooking  Avoid adding salt when cooking. Use salt-free seasonings or herbs instead of  table salt or sea salt. Check with your health care provider or pharmacist before using salt substitutes.  Do not fry foods. Cook foods using healthy methods such as baking, boiling, grilling, and broiling instead.  Cook with heart-healthy oils, such as olive, canola, soybean, or sunflower oil. Meal planning  Eat a balanced diet that includes: ? 5 or more servings of fruits and vegetables each day. At each meal, try to fill half of your plate with fruits and vegetables. ? Up to 6-8 servings of whole grains each day. ? Less than 6 oz of lean meat, poultry, or fish each day. A 3-oz serving of meat is about the same size as a deck of cards. One egg equals 1 oz. ? 2 servings of low-fat dairy each day. ? A serving of nuts, seeds, or beans 5 times each week. ? Heart-healthy fats. Healthy fats called Omega-3 fatty acids are found in foods such as flaxseeds and coldwater fish, like sardines, salmon, and mackerel.  Limit how much you eat of the following: ? Canned or prepackaged foods. ? Food that is high in trans fat, such as fried foods. ? Food that is high in saturated fat, such as fatty meat. ? Sweets, desserts, sugary drinks, and other foods with added sugar. ? Full-fat dairy products.  Do not salt foods before eating.  Try to eat at least 2 vegetarian meals each week.  Eat more home-cooked  food and less restaurant, buffet, and fast food.  When eating at a restaurant, ask that your food be prepared with less salt or no salt, if possible. What foods are recommended? The items listed may not be a complete list. Talk with your dietitian about what dietary choices are best for you. Grains Whole-grain or whole-wheat bread. Whole-grain or whole-wheat pasta. Brown rice. [place]. Bulgur. Whole-grain and low-sodium cereals. Pita bread. Low-fat, low-sodium crackers. Whole-wheat flour tortillas. Vegetables Fresh or frozen vegetables (raw, steamed, roasted, or grilled). Low-sodium or  reduced-sodium tomato and vegetable juice. Low-sodium or reduced-sodium tomato sauce and tomato paste. Low-sodium or reduced-sodium canned vegetables. Fruits All fresh, dried, or frozen fruit. Canned fruit in natural juice (without added sugar). Meat and other protein foods Skinless chicken or [place]. Ground chicken or [place]. Pork with fat trimmed off. Fish and seafood. Egg whites. Dried beans, peas, or lentils. Unsalted nuts, nut butters, and seeds. Unsalted canned beans. Lean cuts of beef with fat trimmed off. Low-sodium, lean deli meat. Dairy Low-fat (1%) or fat-free (skim) milk. Fat-free, low-fat, or reduced-fat cheeses. Nonfat, low-sodium ricotta or cottage cheese. Low-fat or nonfat yogurt. Low-fat, low-sodium cheese. Fats and oils Soft margarine without trans fats. Vegetable oil. Low-fat, reduced-fat, or light mayonnaise and salad dressings (reduced-sodium). Canola, safflower, olive, soybean, and sunflower oils. Avocado. Seasoning and other foods Herbs. Spices. Seasoning mixes without salt. Unsalted popcorn and pretzels. Fat-free sweets. What foods are not recommended? The items listed may not be a complete list. Talk with your dietitian about what dietary choices are best for you. Grains Baked goods made with fat, such as croissants, muffins, or some breads. Dry pasta or rice meal packs. Vegetables Creamed or fried vegetables. Vegetables in a cheese sauce. Regular canned vegetables (not low-sodium or reduced-sodium). Regular canned tomato sauce and paste (not low-sodium or reduced-sodium). Regular tomato and vegetable juice (not low-sodium or reduced-sodium). Angie Fava. Olives. Fruits Canned fruit in a light or heavy syrup. Fried fruit. Fruit in cream or butter sauce. Meat and other protein foods Fatty cuts of meat. Ribs. Fried meat. Berniece Salines. Sausage. Bologna and other processed lunch meats. Salami. Fatback. Hotdogs. Bratwurst. Salted nuts and seeds. Canned beans with added salt. Canned or  smoked fish. Whole eggs or egg yolks. Chicken or [place] with skin. Dairy Whole or 2% milk, cream, and half-and-half. Whole or full-fat cream cheese. Whole-fat or sweetened yogurt. Full-fat cheese. Nondairy creamers. Whipped toppings. Processed cheese and cheese spreads. Fats and oils Butter. Stick margarine. Lard. Shortening. Ghee. Bacon fat. Tropical oils, such as coconut, palm kernel, or palm oil. Seasoning and other foods Salted popcorn and pretzels. Onion salt, garlic salt, seasoned salt, table salt, and sea salt. Worcestershire sauce. Tartar sauce. Barbecue sauce. Teriyaki sauce. Soy sauce, including reduced-sodium. Steak sauce. Canned and packaged gravies. Fish sauce. Oyster sauce. Cocktail sauce. Horseradish that you find on the shelf. Ketchup. Mustard. Meat flavorings and tenderizers. Bouillon cubes. Hot sauce and Tabasco sauce. Premade or packaged marinades. Premade or packaged taco seasonings. Relishes. Regular salad dressings. Where to find more information:  National Heart, Lung, and [place]: https://wilson-eaton.com/  American Heart Association: www.heart.org Summary  The DASH eating plan is a healthy eating plan that has been shown to reduce high blood pressure (hypertension). It may also reduce your risk for type 2 diabetes, heart disease, and stroke.  With the DASH eating plan, you should limit salt (sodium) intake to 2,300 mg a day. If you have hypertension, you may need to reduce your sodium intake to 1,500 mg  a day.  When on the DASH eating plan, aim to eat more fresh fruits and vegetables, whole grains, lean proteins, low-fat dairy, and heart-healthy fats.  Work with your health care provider or diet and nutrition specialist (dietitian) to adjust your eating plan to your individual calorie needs. This information is not intended to replace advice given to you by your health care provider. Make sure you discuss any questions you have with your health care provider. Document  Revised: 11/11/2017 Document Reviewed: 11/22/2016 Elsevier Patient Education  2020 [employer].

## 2020-06-24 NOTE — Progress Notes (Signed)
Provider: Marlowe Sax FNP-C   Rian Busche, Nelda Bucks, NP  Patient Care Team: Daichi Moris, Nelda Bucks, NP as PCP - General (Family Medicine) Teena Irani, MD (Inactive) as Consulting Physician (Gastroenterology) Warden Fillers, MD as Consulting Physician (Ophthalmology) [name], MD as Consulting Physician (Ophthalmology) Mickel Fuchs, MD as Referring Physician (Psychiatry) Velora Heckler (Orthotics)  Extended Emergency Contact Information Primary Emergency Contact: [place] Phone: [phone] Relation: None  Code Status: Full Code  Goals of care: Advanced Directive information Advanced Directives 06/24/2020  Does Patient Have a Medical Advance Directive? No  Type of Advance Directive -  Does patient want to make changes to medical advance directive? No - Patient declined  Would patient like information on creating a medical advance directive? -     Chief Complaint  Patient presents with  . Medical Management of Chronic Issues    4 Month Follow Up.  Marland Kitchen Health Maintenance    Discuss the need for Hepatitis C Screening.    HPI:  Pt is a 79 y.o. female seen today for 4 months follow up for medical management of chronic diseases.she complains of Bump on the cheek of her left  Buttock.states started few days ago like a bump so she applied some tea tree oil which made it flat.she denies any pain,itching or drainage.Also denies any fever or chills.she states read some side effects of Januvia and wonder whether it causes Bumps. Hypertension - No home readings for review.denies any symptoms of hypotension,chest pain or palpitation.on losartan,Coreg and amlodipine    Type 2 DM- recent Hgb A1C 6.3 previous 6.5 brought her CBG log readings in the 110's -120's currently on Januvia 25 mg tablet daily.she states Januvia is making her skin dry and itchy would like to get off Januvia.States read the side effects and noticed that it causes itching.she would like to get back on Metformin  though was previously discontinued because she had requested to get off metformin states whatever pharmacy filled for her was not lowering her blood sugars and was having some stomach upset.she had reported a few days of CBG above 150's.Glimeperide offered but insist on restarting metformin.Later changed her mind and would like to continue with Januvia then notify provider if itching gets worst.she is up to date on her annual eye and foot exam.Not exercising as she used to in the previous year but will try to restart her exercise and watching her diet.Awaiting diabetic shoes ordered by Dr.Reed on previous visit.    Hyperlipidemia - Chol 178,TRG 57,LDL 101 her Total chol and LDL slightly higher than previous lab work.dietary modification and exercise discussed.on rosuvastatin 20 mg tablet daily and Zetia 10 mg tablet daily  GERD - States symptoms welll control try to be cautions of food that worsen her heart burn.  Allergies - nasal congestion has improved states since I advised her to use saline spray  Prior to using Nasonex symptoms have improved.Also on Allegra 180 mg tablet daily as needed.  Past Medical History:  Diagnosis Date  . Acute upper respiratory infections of unspecified site   . Acute upper respiratory infections of unspecified site   . Acute upper respiratory infections of unspecified site   . Allergic rhinitis due to pollen   . Candidiasis of mouth   . Disorder of bone and cartilage, unspecified   . Diverticulitis of colon (without mention of hemorrhage)(562.11)   . Essential hypertension, benign   . Essential hypertension, benign   . Extrinsic asthma, unspecified   . Herpes simplex with other  ophthalmic complications   . Other abnormal glucose   . Other and unspecified hyperlipidemia   . Other and unspecified hyperlipidemia   . Other and unspecified hyperlipidemia   . Other cataract   . Other cataract   . Other dystrophy of vulva   . Other malaise and fatigue   . Rash  and other nonspecific skin eruption   . Reflux esophagitis   . Thrombocytopenia, unspecified ([place])   . Type II or unspecified type diabetes mellitus without mention of complication, not stated as uncontrolled   . Type II or unspecified type diabetes mellitus without mention of complication, not stated as uncontrolled   . Unspecified disorder of skin and subcutaneous tissue   . Unspecified essential hypertension   . Unspecified essential hypertension    Past Surgical History:  Procedure Laterality Date  . ABDOMINAL HYSTERECTOMY    . CESAREAN SECTION     [phone]  . CYST EXCISION  1974   Pilonidal Cyst excision  . EYE SURGERY Right 04/14/2017   Dr. Zadie Rhine  . KNEE ARTHROSCOPY Right    for meniscal tear  . RETINAL TEAR REPAIR CRYOTHERAPY  10/21/2017   Getting injections in right eye    Allergies  Allergen Reactions  . Crestor [Rosuvastatin Calcium]     Muscle cramps  . Latex Other (See Comments)    GLOVES  . Sulfa Antibiotics     Allergies as of 06/24/2020      Reactions   Crestor [rosuvastatin Calcium]    Muscle cramps   Latex Other (See Comments)   GLOVES   Sulfa Antibiotics       Medication List       Accurate as of June 24, 2020  8:42 AM. If you have any questions, ask your nurse or doctor.        acetaminophen 325 MG tablet Commonly known as: TYLENOL Take 325 mg by mouth daily as needed. For pain   albuterol 108 (90 Base) MCG/ACT inhaler Commonly known as: Ventolin HFA Inhale 2 puffs into the lungs every 6 (six) hours as needed for wheezing or shortness of breath.   amLODipine 5 MG tablet Commonly known as: NORVASC Take 1 tablet (5 mg total) by mouth daily.   budesonide-formoterol 160-4.5 MCG/ACT inhaler Commonly known as: Symbicort USE 2 INHALATIONS TWICE DAILY   calcium-vitamin D 500-200 MG-UNIT tablet Commonly known as: OSCAL WITH D Take 1 tablet by mouth 2 (two) times daily.   carvedilol 12.5 MG tablet Commonly known as: COREG Take  one tablet by mouth once daily for blood pressure   ELDERBERRY PO Take by mouth 3 (three) times a week.   ezetimibe 10 MG tablet Commonly known as: ZETIA Take 1 tablet (10 mg total) by mouth daily.   fexofenadine 180 MG tablet Commonly known as: ALLEGRA Take 180 mg by mouth as needed.   KETOROLAC TROMETHAMINE OP Apply 1 drop to eye 4 (four) times daily. Right eye   losartan 100 MG tablet Commonly known as: Cozaar Take 1 tablet (100 mg total) by mouth daily.   Magnesium Oxide 500 MG Caps Take 2 capsules by mouth at bedtime.   mometasone 50 MCG/ACT nasal spray Commonly known as: NASONEX Place 1 spray into the nose 2 (two) times daily as needed.   prednisoLONE acetate 1 % ophthalmic suspension Commonly known as: PRED FORTE Place 1 drop into the right eye 4 (four) times daily.   psyllium 0.52 g capsule Commonly known as: REGULOID Take 0.52 g by mouth daily.  rosuvastatin 20 MG tablet Commonly known as: CRESTOR Take one tablet three times a week, Monday, Wednesday and Friday   simethicone 80 MG chewable tablet Commonly known as: Gas-X Chew 1 tablet (80 mg total) by mouth every 6 (six) hours as needed for flatulence.   sitaGLIPtin 25 MG tablet Commonly known as: Januvia Take 1 tablet (25 mg total) by mouth daily.   sodium chloride 0.65 % Soln nasal spray Commonly known as: OCEAN Place 1 spray into both nostrils as needed for congestion.       Review of Systems  Constitutional: Negative for appetite change, chills, fatigue and fever.  HENT: Negative for rhinorrhea, sinus pressure, sinus pain, sneezing and sore throat.        Nasal congestion has improved with use of nasal spray   Eyes: Positive for visual disturbance. Negative for pain, discharge, redness and itching.       Wears eye glasses  Respiratory: Negative for cough, chest tightness, shortness of breath and wheezing.   Cardiovascular: Negative for chest pain, palpitations and leg swelling.    Gastrointestinal: Negative for abdominal distention, abdominal pain, constipation, diarrhea, nausea and vomiting.  Musculoskeletal: Negative for arthralgias, gait problem, joint swelling and myalgias.  Skin: Negative for color change, pallor and rash.       Bump on her left buttock.  Itching thinks from Januvia   Neurological: Positive for numbness. Negative for dizziness, speech difficulty, weakness, light-headedness and headaches.  Hematological: Does not bruise/bleed easily.  Psychiatric/Behavioral: Negative for agitation and sleep disturbance. The patient is not nervous/anxious.     Immunization History  Administered Date(s) Administered  . Fluad Quad(high Dose 65+) 08/29/2019  . Influenza Split 08/25/2010, 08/26/2011, 10/04/2012  . Influenza Whole 09/24/2009  . Influenza, High Dose Seasonal PF 08/16/2018  . Influenza,inj,Quad PF,6+ Mos 09/11/2013, 09/04/2014, 11/12/2015, 10/04/2016, 09/06/2017  . Influenza-Unspecified 09/02/2018  . PFIZER SARS-COV-2 Vaccination 02/11/2020, 03/03/2020  . Pneumococcal Conjugate-13 02/17/2012  . Pneumococcal Polysaccharide-23 05/19/2016  . Tdap 02/17/2012   Pertinent  Health Maintenance Due  Topic Date Due  . INFLUENZA VACCINE  07/13/2020  . OPHTHALMOLOGY EXAM  08/14/2020  . HEMOGLOBIN A1C  12/19/2020  . FOOT EXAM  05/29/2021  . DEXA SCAN  Completed  . PNA vac Low Risk Adult  Completed   Fall Risk  06/24/2020 05/22/2020 04/04/2020 02/21/2020 01/04/2020  Falls in the past year? 0 0 0 0 0  Number falls in past yr: 0 0 0 0 0  Injury with Fall? 0 0 0 0 0    Vitals:   06/24/20 0831  BP: 130/84  Pulse: 95  Resp: 16  Temp: (!) 97.5 F (36.4 C)  SpO2: 98%  Weight: 158 lb 3.2 oz (71.8 kg)  Height: 5' 1"  (1.549 m)   Body mass index is 29.89 kg/m. Physical Exam Vitals reviewed.  Constitutional:      General: She is not in acute distress.    Appearance: She is overweight. She is not ill-appearing.  HENT:     Head: Normocephalic.     Right  Ear: Tympanic membrane, ear canal and external ear normal. There is no impacted cerumen.     Left Ear: Tympanic membrane, ear canal and external ear normal. There is no impacted cerumen.     Nose: Nose normal. No congestion or rhinorrhea.     Mouth/Throat:     Mouth: Mucous membranes are moist.     Pharynx: Oropharynx is clear. No oropharyngeal exudate or posterior oropharyngeal erythema.  Eyes:  General: No scleral icterus.       Right eye: No discharge.        Left eye: No discharge.     Extraocular Movements: Extraocular movements intact.     Conjunctiva/sclera: Conjunctivae normal.     Pupils: Pupils are equal, round, and reactive to light.  Neck:     Vascular: No carotid bruit.  Cardiovascular:     Rate and Rhythm: Normal rate and regular rhythm.     Pulses: Normal pulses.     Heart sounds: Normal heart sounds. No murmur heard.  No friction rub. No gallop.   Pulmonary:     Effort: Pulmonary effort is normal. No respiratory distress.     Breath sounds: Normal breath sounds. No wheezing, rhonchi or rales.  Chest:     Chest wall: No tenderness.  Abdominal:     General: Bowel sounds are normal. There is no distension.     Palpations: Abdomen is soft. There is no mass.     Tenderness: There is no abdominal tenderness. There is no right CVA tenderness, left CVA tenderness, guarding or rebound.  Musculoskeletal:        General: No swelling or tenderness. Normal range of motion.     Cervical back: Normal range of motion. No rigidity or tenderness.     Right lower leg: No edema.     Left lower leg: No edema.  Lymphadenopathy:     Cervical: No cervical adenopathy.  Skin:    General: Skin is warm.     Coloration: Skin is not pale.     Findings: No erythema, lesion or rash.  Neurological:     Mental Status: She is alert and oriented to person, place, and time.     Cranial Nerves: No cranial nerve deficit.     Sensory: No sensory deficit.     Motor: No weakness.      Coordination: Coordination normal.     Gait: Gait normal.     Comments: Seems to be forgetful sometimes.   Psychiatric:        Mood and Affect: Mood normal.        Behavior: Behavior normal.        Thought Content: Thought content normal.        Judgment: Judgment normal.    Labs reviewed: Recent Labs    10/22/19 0929 02/18/20 0806 06/18/20 0816  NA 142 139 140  K 4.4 4.1 4.1  CL 106 104 105  CO2 28 28 27   GLUCOSE 109* 110* 114*  BUN 13 14 14   CREATININE 0.70 0.67 0.68  CALCIUM 9.7 9.6 9.6   Recent Labs    10/22/19 0929 02/18/20 0806 06/18/20 0816  AST 12 11 14   ALT 14 13 17   BILITOT 0.4 0.4 0.5  PROT 7.2 7.1 7.2   Recent Labs    10/22/19 0929 02/18/20 0806 06/18/20 0816  WBC 5.2 5.7 6.2  NEUTROABS 2,106 2,058 2,288  HGB 13.5 13.5 13.3  HCT 41.7 41.0 40.1  MCV 78.7* 79.2* 78.0*  PLT 187 201 152   Lab Results  Component Value Date   TSH 1.85 06/18/2020   Lab Results  Component Value Date   HGBA1C 6.3 (H) 06/18/2020   Lab Results  Component Value Date   CHOL 178 06/18/2020   HDL 63 06/18/2020   LDLCALC 101 (H) 06/18/2020   TRIG 57 06/18/2020   CHOLHDL 2.8 06/18/2020    Significant Diagnostic Results in last 30 days:  No results  found.  Assessment/Plan 1. Controlled type 2 diabetes mellitus with diabetic polyneuropathy, without long-term current use of insulin (HCC) Lab Results  Component Value Date   HGBA1C 6.3 (H) 06/18/2020  Controled. - continue on dietary modification and exercise - Wanted to restart on metformin then changed her mind states will continue on Januvia then notify provider if itching worsen.confused about her metformin sometimes states gave stomach upset and at times states didn't give her any problems.states a different brand was given by pharmacy which didn't control her blood sugars for a few days.Her CBG are controlled but doesn't think so.  - on ARB and Statin  - Has completed her COVID-19 vaccine.Advised to continue to  wear her facial mask,practice hand hygiene and social distancing.  - up to date on her annual eye and foot exam. - TSH; Future - Hemoglobin A1c; Future  2. Essential hypertension B/p at goal.continue on losartan,Coreg and amlodipine  - CBC with Differential/Platelet; Future - CMP with eGFR([facility]); Future  3. Hyperlipidemia LDL goal <100 LDL close to goal. - continue on rosuvastatin 20 mg tablet daily and Zetia 10 mg tablet daily - Dietary modification and exercise advised. - Lipid panel; Future  4. Gastroesophageal reflux disease without esophagitis H/H stable. - symptoms under control tries to be cautious with what she eats.  - CBC with Differential/Platelet; Future  5. Seasonal allergic rhinitis due to pollen Symptoms have improved since advised to use saline spray prior to using Nasonex spray.continue on Allergra.    Family/ staff Communication: Reviewed plan of care with patient verbalized understanding.   Labs/tests ordered:  - CBC with Differential/Platelet; Future - CMP with eGFR([facility]); Future - TSH; Future - Hemoglobin A1c; Future - Lipid panel; Future  Next Appointment :  6 months for medical management of chronic issues.Fasting labs 2-4 days prior to visit.   Tanya Hughs, NP

## 2020-06-26 ENCOUNTER — Telehealth: Payer: Self-pay | Admitting: *Deleted

## 2020-06-26 MED ORDER — SITAGLIPTIN PHOSPHATE 25 MG PO TABS: 25.0000 mg | ORAL_TABLET | Freq: Every day | ORAL | 1 refills | Status: DC

## 2020-06-26 NOTE — Telephone Encounter (Signed)
Patient called and stated that she was seen on 06/24/2020 for reaction of itching to Januvia.  Patient stated that this reaction is better and likes the results of the Januvia and requesting to stay on it until at least January. Stated that it is working to keep the blood sugar under control. Stated that she has not had the Metformin filled because she wants to stay on the [place].  Needs refill is this is ok.  Please Advise.

## 2020-06-26 NOTE — Telephone Encounter (Signed)
Patient notified and agreed. Medication list updated and Rx sent to pharmacy.  

## 2020-06-26 NOTE — Telephone Encounter (Signed)
Okay.Just update provider if symptoms worsen.

## 2020-07-02 ENCOUNTER — Ambulatory Visit: Payer: Medicare Other | Admitting: Orthotics

## 2020-07-02 ENCOUNTER — Other Ambulatory Visit: Payer: Self-pay

## 2020-07-03 ENCOUNTER — Other Ambulatory Visit: Payer: Self-pay

## 2020-07-03 ENCOUNTER — Encounter: Payer: Medicare Other | Attending: Internal Medicine | Admitting: Skilled Nursing Facility1

## 2020-07-03 DIAGNOSIS — E1142 Type 2 diabetes mellitus with diabetic polyneuropathy: Secondary | ICD-10-CM | POA: Diagnosis not present

## 2020-07-03 NOTE — Progress Notes (Signed)
  Assessment:  Primary concerns today: weight management.   A1C 6.3 Pt states she checks her blood sugar daily: fasting 113, 116, 120, 127 Pt admits to boredom eating and increased snacking.    MEDICATIONS: see list   DIETARY INTAKE:  Usual eating pattern includes 3 meals and 2 snacks per day.  Everyday foods include none stated  Avoided foods include bread   24-hr recall:  B ( AM): oatmeal + cinnomon + eggs Snk ( AM):  L ( PM):  Snk ( PM):  D ( PM):  Snk ( PM):  Beverages: coffee + splenda + half and half, almond + coccnut milk  Usual physical activity: ADL's  Estimated energy needs: 1200-1500 calories     Intervention:  Nutrition counseling. Dietitian educated pt on healthful eating within the context of weight management and blood sugar management.   Education topics: Hypoglycemia with prevention and correction Balanced meals Balancing the number of snacks throughout the day Exercise and blood sugar management   Handouts given: Detailed myplate Snack ideas How to thrive book  Goals: -Try to get up every 30 minutes to stretch your body -Do arm chair exercises daily  -Look for the [employer] seal at the bottom of your multi -Always bring your meter with you everywhere you go -Always Properly dispose of your needles:  -Discard in a hard plastic/metal container with a lid (something the needle can't puncture)  -Write Do Not Recycle on the outside of the container  -Example: A laundry detergent bottle -Never use the same needle more than once -Eat 2-3 carbohydrate choices for each meal and 1 for each snack -A meal: carbohydrates, protein, vegetable -A snack: A Fruit OR Vegetable AND Protein  -Try to be more active -Always pay attention to your body keeping watchful of possible low blood sugar (below 70) or high blood sugar (above 200) -First thing in the morning you want below 130 and if checking 2 hours after eating under 180 -Check your feet every day looking for  anything that was not there the day before -Eat within 1-15. Hours after waking  -Look into starting a new hobby: something with your hands while you watch TV; maybe adult coloring books  Teaching Method Utilized:  Visual Auditory Hands on  Handouts given during visit include:  Detailed myplate  How to thrive book  Barriers to learning/adherence to lifestyle change: none stated   Demonstrated degree of understanding via:  Teach Back   Monitoring/Evaluation:  Dietary intake, exercise, and body weight prn.

## 2020-07-21 ENCOUNTER — Ambulatory Visit: Payer: Medicare Other | Admitting: Podiatry

## 2020-07-30 ENCOUNTER — Other Ambulatory Visit: Payer: Self-pay | Admitting: *Deleted

## 2020-07-30 MED ORDER — SITAGLIPTIN PHOSPHATE 25 MG PO TABS: 25.0000 mg | ORAL_TABLET | Freq: Every day | ORAL | 1 refills | Status: DC

## 2020-07-30 NOTE — Telephone Encounter (Signed)
Patient requested refill to be sent to [facility].  

## 2020-08-04 ENCOUNTER — Telehealth: Payer: Self-pay

## 2020-08-04 MED ORDER — SITAGLIPTIN PHOSPHATE 25 MG PO TABS: 25.0000 mg | ORAL_TABLET | Freq: Every day | ORAL | 1 refills | Status: DC

## 2020-08-04 NOTE — Telephone Encounter (Signed)
Patient called and states that medication "Januvia" was sent into [facility] by Rodena Piety May, CMA. But when patient called [facility] they state that they don't have medication. I can see that medication was sent in 07/30/2020, please let me know if I'm missing something.

## 2020-08-04 NOTE — Telephone Encounter (Signed)
Rx re-sent to [facility]. 

## 2020-08-11 ENCOUNTER — Other Ambulatory Visit: Payer: Self-pay | Admitting: *Deleted

## 2020-08-11 MED ORDER — EZETIMIBE 10 MG PO TABS: 10.0000 mg | ORAL_TABLET | Freq: Every day | ORAL | 1 refills | Status: DC

## 2020-08-11 NOTE — Telephone Encounter (Signed)
Patient requested refill to be sent to [facility] With ID # [id number] [place] Stated that she had spoken with them and that needed to be included on the Rx.

## 2020-08-12 ENCOUNTER — Telehealth: Payer: Self-pay | Admitting: Podiatry

## 2020-08-12 NOTE — Telephone Encounter (Signed)
Left message for pt to call to r/s appt scheduled for 11.8.2021 to pu diabetic shoes as her authorization expires 9.17.2021 so she needs to come in before that date.

## 2020-08-20 ENCOUNTER — Other Ambulatory Visit: Payer: Self-pay | Admitting: *Deleted

## 2020-08-20 ENCOUNTER — Telehealth: Payer: Self-pay | Admitting: *Deleted

## 2020-08-20 DIAGNOSIS — I10 Essential (primary) hypertension: Secondary | ICD-10-CM

## 2020-08-20 MED ORDER — AMLODIPINE BESYLATE 5 MG PO TABS: 5.0000 mg | ORAL_TABLET | Freq: Every day | ORAL | 1 refills | Status: DC

## 2020-08-20 MED ORDER — EZETIMIBE 10 MG PO TABS: 10.0000 mg | ORAL_TABLET | Freq: Every day | ORAL | 1 refills | Status: DC

## 2020-08-20 NOTE — Telephone Encounter (Signed)
Patient called and stated that we have her mail order Rx's messed up. Stated that [facility] told her that we are sending in the wrong insurance information with her Rx's. And she has a hard time getting her Rx's   I explained to patient that we do not send insurance information with our Rx's. That was between patient and Mail order. She disagreed. Stated that I needed to call [facility] and straighten it out. [phone]  I called [facility] and spoke with JaVaughn. She stated that there was a problem with patient's account between the times of 07/30/2020-08/11/2020. Stated that the Benefit's Office marked her account INACTIVE. Stated that it was NOT on our end but the Plan end. JaVaughn stated that the Benefits Office has since corrected the problem. Stated that the only thing our office needed to do was send in a new Rx for the Zetia and everything should be good from there.   Patient notified and agreed.  Rx sent to Mail order pharmacy.

## 2020-08-20 NOTE — Telephone Encounter (Signed)
Patient requested refill to be sent to [facility].  

## 2020-09-02 ENCOUNTER — Other Ambulatory Visit: Payer: Self-pay

## 2020-09-02 ENCOUNTER — Ambulatory Visit (INDEPENDENT_AMBULATORY_CARE_PROVIDER_SITE_OTHER): Payer: Medicare Other | Admitting: *Deleted

## 2020-09-02 DIAGNOSIS — Z23 Encounter for immunization: Secondary | ICD-10-CM | POA: Diagnosis not present

## 2020-09-17 DIAGNOSIS — Z23 Encounter for immunization: Secondary | ICD-10-CM | POA: Diagnosis not present

## 2020-09-18 DIAGNOSIS — H35371 Puckering of macula, right eye: Secondary | ICD-10-CM | POA: Diagnosis not present

## 2020-09-18 DIAGNOSIS — H35351 Cystoid macular degeneration, right eye: Secondary | ICD-10-CM | POA: Diagnosis not present

## 2020-09-18 DIAGNOSIS — H3521 Other non-diabetic proliferative retinopathy, right eye: Secondary | ICD-10-CM | POA: Diagnosis not present

## 2020-10-15 ENCOUNTER — Other Ambulatory Visit: Payer: Self-pay | Admitting: *Deleted

## 2020-10-15 DIAGNOSIS — I10 Essential (primary) hypertension: Secondary | ICD-10-CM

## 2020-10-15 MED ORDER — CARVEDILOL 12.5 MG PO TABS: ORAL_TABLET | ORAL | 1 refills | Status: DC

## 2020-10-15 NOTE — Telephone Encounter (Signed)
Patient requested refill to be sent to [facility].  

## 2020-10-20 ENCOUNTER — Ambulatory Visit (INDEPENDENT_AMBULATORY_CARE_PROVIDER_SITE_OTHER): Payer: Medicare Other | Admitting: Podiatry

## 2020-10-20 ENCOUNTER — Other Ambulatory Visit: Payer: Self-pay

## 2020-10-20 ENCOUNTER — Ambulatory Visit (INDEPENDENT_AMBULATORY_CARE_PROVIDER_SITE_OTHER): Payer: Medicare Other | Admitting: Orthotics

## 2020-10-20 ENCOUNTER — Encounter: Payer: Self-pay | Admitting: Podiatry

## 2020-10-20 DIAGNOSIS — E119 Type 2 diabetes mellitus without complications: Secondary | ICD-10-CM

## 2020-10-20 DIAGNOSIS — M79675 Pain in left toe(s): Secondary | ICD-10-CM

## 2020-10-20 DIAGNOSIS — B351 Tinea unguium: Secondary | ICD-10-CM | POA: Diagnosis not present

## 2020-10-20 DIAGNOSIS — L84 Corns and callosities: Secondary | ICD-10-CM

## 2020-10-20 DIAGNOSIS — M2041 Other hammer toe(s) (acquired), right foot: Secondary | ICD-10-CM

## 2020-10-20 DIAGNOSIS — Q828 Other specified congenital malformations of skin: Secondary | ICD-10-CM | POA: Diagnosis not present

## 2020-10-20 DIAGNOSIS — M2042 Other hammer toe(s) (acquired), left foot: Secondary | ICD-10-CM

## 2020-10-20 DIAGNOSIS — M79674 Pain in right toe(s): Secondary | ICD-10-CM

## 2020-10-20 NOTE — Progress Notes (Signed)

## 2020-10-23 NOTE — Progress Notes (Signed)
Subjective:  Patient ID: Tanya Bell, female    DOB: May 21, 1941,  MRN: [id number]  79 y.o. female presents with preventative diabetic foot care and painful callus(es) b/l feet and painful thick toenails that are difficult to trim. Painful toenails interfere with ambulation. Aggravating factors include wearing enclosed shoe gear. Pain is relieved with periodic professional debridement. Painful calluses are aggravated when weightbearing with and without shoegear. Pain is relieved with periodic professional debridement..    Patient's blood sugar was 94 mg/dl this morning.  PCP: Sandrea Hughs, NP and last visit was: 06/24/2020.  Review of Systems: Negative except as noted in the HPI.  Past Medical History:  Diagnosis Date  . Acute upper respiratory infections of unspecified site   . Acute upper respiratory infections of unspecified site   . Acute upper respiratory infections of unspecified site   . Allergic rhinitis due to pollen   . Candidiasis of mouth   . Disorder of bone and cartilage, unspecified   . Diverticulitis of colon (without mention of hemorrhage)(562.11)   . Essential hypertension, benign   . Essential hypertension, benign   . Extrinsic asthma, unspecified   . Herpes simplex with other ophthalmic complications   . Other abnormal glucose   . Other and unspecified hyperlipidemia   . Other and unspecified hyperlipidemia   . Other and unspecified hyperlipidemia   . Other cataract   . Other cataract   . Other dystrophy of vulva   . Other malaise and fatigue   . Rash and other nonspecific skin eruption   . Reflux esophagitis   . Thrombocytopenia, unspecified ([place])   . Type II or unspecified type diabetes mellitus without mention of complication, not stated as uncontrolled   . Type II or unspecified type diabetes mellitus without mention of complication, not stated as uncontrolled   . Unspecified disorder of skin and subcutaneous tissue   . Unspecified essential  hypertension   . Unspecified essential hypertension    Past Surgical History:  Procedure Laterality Date  . ABDOMINAL HYSTERECTOMY    . CESAREAN SECTION     [phone]  . CYST EXCISION  1974   Pilonidal Cyst excision  . EYE SURGERY Right 04/14/2017   Dr. Zadie Rhine  . KNEE ARTHROSCOPY Right    for meniscal tear  . RETINAL TEAR REPAIR CRYOTHERAPY  10/21/2017   Getting injections in right eye   Patient Active Problem List   Diagnosis Date Noted  . Diabetes mellitus type 2, controlled ([place]) 02/04/2015  . Varicose veins of leg with edema 02/04/2015  . Unspecified essential hypertension 05/07/2014  . Folliculitis 13/07/6577  . HTN (hypertension) 03/06/2013  . Vaginal dryness, menopausal 03/06/2013  . Eczema 03/06/2013  . Annual physical exam 03/06/2013  . Herpes simplex with other ophthalmic complications 46/96/2952  . Disorder of bone and cartilage, unspecified 01/04/2013  . Reflux esophagitis 01/04/2013  . Candidiasis of mouth 01/04/2013  . Other cataract 01/04/2013  . Acute upper respiratory infections of unspecified site 01/04/2013  . Sickle cell anemia ([place]) 01/04/2013  . Thrombocytopenia ([place]) 01/04/2013  . Other dystrophy of vulva 01/04/2013  . Diverticulitis of colon (without mention of hemorrhage)(562.11) 01/04/2013  . Unspecified disorder of skin and subcutaneous tissue 01/04/2013  . Rash and other nonspecific skin eruption 01/04/2013  . Type II or unspecified type diabetes mellitus without mention of complication, not stated as uncontrolled 01/04/2013  . Hyperlipidemia LDL goal <100 01/04/2013  . Seasonal allergic rhinitis due to pollen 01/04/2013  . Extrinsic asthma, unspecified  asthma severity, uncomplicated 16/09/9603  . Other malaise and fatigue 01/04/2013  . Other abnormal glucose 01/04/2013    Current Outpatient Medications:  .  acetaminophen (TYLENOL) 325 MG tablet, Take 325 mg by mouth daily as needed. For pain, Disp: , Rfl:  .  albuterol (VENTOLIN  HFA) 108 (90 Base) MCG/ACT inhaler, Inhale 2 puffs into the lungs every 6 (six) hours as needed for wheezing or shortness of breath., Disp: 18 g, Rfl: 1 .  amLODipine (NORVASC) 5 MG tablet, Take 1 tablet (5 mg total) by mouth daily., Disp: 90 tablet, Rfl: 1 .  budesonide-formoterol (SYMBICORT) 160-4.5 MCG/ACT inhaler, USE 2 INHALATIONS TWICE DAILY, Disp: 10.2 Inhaler, Rfl: 0 .  calcium-vitamin D (OSCAL WITH D) 500-200 MG-UNIT per tablet, Take 1 tablet by mouth 2 (two) times daily., Disp: 60 tablet, Rfl: 0 .  carvedilol (COREG) 12.5 MG tablet, Take one tablet by mouth once daily for blood pressure, Disp: 90 tablet, Rfl: 1 .  ELDERBERRY PO, Take by mouth 3 (three) times a week. , Disp: , Rfl:  .  ezetimibe (ZETIA) 10 MG tablet, Take 1 tablet (10 mg total) by mouth daily., Disp: 90 tablet, Rfl: 1 .  fexofenadine (ALLEGRA) 180 MG tablet, Take 180 mg by mouth as needed. , Disp: , Rfl:  .  KETOROLAC TROMETHAMINE OP, Apply 1 drop to eye 4 (four) times daily. Right eye, Disp: , Rfl:  .  losartan (COZAAR) 100 MG tablet, Take 1 tablet (100 mg total) by mouth daily., Disp: 90 tablet, Rfl: 1 .  Magnesium Oxide 500 MG CAPS, Take 2 capsules by mouth at bedtime., Disp: , Rfl:  .  mometasone (NASONEX) 50 MCG/ACT nasal spray, Place 1 spray into the nose 2 (two) times daily as needed., Disp: 51 g, Rfl: 3 .  prednisoLONE acetate (PRED FORTE) 1 % ophthalmic suspension, Place 1 drop into the right eye 4 (four) times daily. , Disp: , Rfl:  .  psyllium (REGULOID) 0.52 g capsule, Take 0.52 g by mouth daily., Disp: , Rfl:  .  rosuvastatin (CRESTOR) 20 MG tablet, Take one tablet three times a week, Monday, Wednesday and Friday, Disp: 36 tablet, Rfl: 3 .  simethicone (GAS-X) 80 MG chewable tablet, Chew 1 tablet (80 mg total) by mouth every 6 (six) hours as needed for flatulence., Disp: 30 tablet, Rfl: 0 .  sitaGLIPtin (JANUVIA) 25 MG tablet, Take 1 tablet (25 mg total) by mouth daily., Disp: 90 tablet, Rfl: 1 .  sodium  chloride (OCEAN) 0.65 % SOLN nasal spray, Place 1 spray into both nostrils as needed for congestion., Disp: 30 mL, Rfl: 0 Allergies  Allergen Reactions  . Crestor [Rosuvastatin Calcium]     Muscle cramps  . Latex Other (See Comments)    GLOVES  . Sulfa Antibiotics    Social History   Tobacco Use  Smoking Status Former Smoker  . Types: Cigarettes  . Quit date: 12/13/1990  . Years since quitting: 29.8  Smokeless Tobacco Never Used    Objective:  There were no vitals filed for this visit. Constitutional Patient is a pleasant 79 y.o. African American female in NAD. AAO x 3.  Vascular Capillary refill time to digits immediate b/l. Palpable pedal pulses b/l LE. Pedal hair sparse. Lower extremity skin temperature gradient within normal limits. No pain with calf compression b/l. Varicosities present b/l. No cyanosis or clubbing noted.  Neurologic Normal speech. Protective sensation intact 5/5 intact bilaterally with 10g monofilament b/l. Vibratory sensation intact b/l.  Dermatologic Pedal skin with  normal turgor, texture and tone bilaterally. No open wounds bilaterally. No interdigital macerations bilaterally. Toenails 1-5 b/l elongated, discolored, dystrophic, thickened, crumbly with subungual debris and tenderness to dorsal palpation. Hyperkeratotic lesion(s) submet head 1 left foot, submet head 5 left foot and submet head 5 right foot.  No erythema, no edema, no drainage, no fluctuance. Porokeratotic lesion(s) submet head 1 right foot. No erythema, no edema, no drainage, no fluctuance.  Orthopedic: Normal muscle strength 5/5 to all lower extremity muscle groups bilaterally. No pain crepitus or joint limitation noted with ROM b/l. Hallux valgus with bunion deformity noted b/l lower extremities. Hammertoes noted to the L 5th toe and R 5th toe.   Hemoglobin A1C Latest Ref Rng & Units 06/18/2020 02/18/2020  HGBA1C <5.7 % of total Hgb 6.3(H) 6.5(H)  Some recent data might be hidden   Assessment:    1. Pain due to onychomycosis of toenails of both feet   2. Callus   3. Porokeratosis   4. Acquired hammertoes of both feet   5. Controlled type 2 diabetes mellitus without complication, without long-term current use of insulin ([place])    Plan:  Patient was evaluated and treated and all questions answered.  Onychomycosis with pain -Nails palliatively debridement as below. -Educated on self-care  Procedure: Nail Debridement Rationale: Pain Type of Debridement: manual, sharp debridement. Instrumentation: Nail nipper, rotary burr. Number of Nails: 10  -Examined patient. -No new findings. No new orders. -Continue diabetic foot care principles. -Patient to continue soft, supportive shoe gear daily. -Toenails 1-5 b/l were debrided in length and girth with sterile nail nippers and dremel without iatrogenic bleeding.  -Callus(es) submet head 1 left foot, submet head 5 left foot and submet head 5 right foot pared utilizing sterile scalpel blade without complication or incident. Total number debrided =3. -Painful porokeratotic lesion(s) submet head 1 right foot pared and enucleated with sterile scalpel blade without incident. -Patient to report any pedal injuries to medical professional immediately. -Patient/POA to call should there be question/concern in the interim.  Return in about 3 months (around 01/20/2021) for diabetic toenails, corn(s)/callus(es).  Marzetta Board, DPM

## 2020-10-31 ENCOUNTER — Other Ambulatory Visit: Payer: Self-pay | Admitting: *Deleted

## 2020-10-31 MED ORDER — LOSARTAN POTASSIUM 100 MG PO TABS
100.0000 mg | ORAL_TABLET | Freq: Every day | ORAL | 0 refills | Status: DC
Start: 2020-10-31 — End: 2020-10-31

## 2020-10-31 MED ORDER — LOSARTAN POTASSIUM 100 MG PO TABS
100.0000 mg | ORAL_TABLET | Freq: Every day | ORAL | 1 refills | Status: DC
Start: 2020-10-31 — End: 2021-05-14

## 2020-10-31 NOTE — Telephone Encounter (Signed)
Patient called requesting a local supply and a Mail order to be faxed. Out of medication today.

## 2020-12-18 ENCOUNTER — Other Ambulatory Visit: Payer: Self-pay | Admitting: *Deleted

## 2020-12-18 DIAGNOSIS — J45909 Unspecified asthma, uncomplicated: Secondary | ICD-10-CM

## 2020-12-18 MED ORDER — BUDESONIDE-FORMOTEROL FUMARATE 160-4.5 MCG/ACT IN AERO: INHALATION_SPRAY | RESPIRATORY_TRACT | 0 refills | Status: DC

## 2020-12-18 MED ORDER — BUDESONIDE-FORMOTEROL FUMARATE 160-4.5 MCG/ACT IN AERO: INHALATION_SPRAY | RESPIRATORY_TRACT | 3 refills | Status: DC

## 2020-12-18 NOTE — Telephone Encounter (Signed)
Patient called requesting Refill to be sent to local pharmacy and mail order

## 2020-12-22 ENCOUNTER — Other Ambulatory Visit: Payer: Self-pay

## 2020-12-22 ENCOUNTER — Other Ambulatory Visit: Payer: Medicare Other

## 2020-12-22 DIAGNOSIS — I10 Essential (primary) hypertension: Secondary | ICD-10-CM | POA: Diagnosis not present

## 2020-12-22 DIAGNOSIS — E785 Hyperlipidemia, unspecified: Secondary | ICD-10-CM | POA: Diagnosis not present

## 2020-12-22 DIAGNOSIS — K219 Gastro-esophageal reflux disease without esophagitis: Secondary | ICD-10-CM | POA: Diagnosis not present

## 2020-12-22 DIAGNOSIS — E1142 Type 2 diabetes mellitus with diabetic polyneuropathy: Secondary | ICD-10-CM

## 2020-12-23 LAB — LIPID PANEL
Cholesterol: 133 mg/dL (ref <200)
HDL: 55 mg/dL (ref >=50)
LDL Cholesterol (Calc): 65 mg/dL (calc)
Non-HDL Cholesterol (Calc): 78 mg/dL (calc) (ref <130)
Total CHOL/HDL Ratio: 2.4 (calc) (ref <5.0)
Triglycerides: 47 mg/dL (ref <150)

## 2020-12-23 LAB — COMPLETE METABOLIC PANEL WITH GFR
AG Ratio: 1.3 (calc) (ref 1.0–2.5)
ALT: 14 U/L (ref 6–29)
AST: 15 U/L (ref 10–35)
Albumin: 4.1 g/dL (ref 3.6–5.1)
Alkaline phosphatase (APISO): 68 U/L (ref 37–153)
BUN: 18 mg/dL (ref 7–25)
CO2: 28 mmol/L (ref 20–32)
Calcium: 9.9 mg/dL (ref 8.6–10.4)
Chloride: 107 mmol/L (ref 98–110)
Creat: 0.77 mg/dL (ref 0.60–0.93)
GFR, Est African American: 86 mL/min/{1.73_m2} (ref >=60)
GFR, Est Non African American: 74 mL/min/{1.73_m2} (ref >=60)
Globulin: 3.1 g/dL (calc) (ref 1.9–3.7)
Glucose, Bld: 125 mg/dL — ABNORMAL HIGH (ref 65–99)
Potassium: 4.5 mmol/L (ref 3.5–5.3)
Sodium: 144 mmol/L (ref 135–146)
Total Bilirubin: 0.6 mg/dL (ref 0.2–1.2)
Total Protein: 7.2 g/dL (ref 6.1–8.1)

## 2020-12-23 LAB — CBC WITH DIFFERENTIAL/PLATELET
Absolute Monocytes: 616 cells/uL (ref 200–950)
Basophils Absolute: 47 cells/uL (ref 0–200)
Basophils Relative: 0.7 %
Eosinophils Absolute: 168 cells/uL (ref 15–500)
Eosinophils Relative: 2.5 %
HCT: 41.8 % (ref 35.0–45.0)
Hemoglobin: 13.9 g/dL (ref 11.7–15.5)
Lymphs Abs: 3122 cells/uL (ref 850–3900)
MCH: 26.2 pg — ABNORMAL LOW (ref 27.0–33.0)
MCHC: 33.3 g/dL (ref 32.0–36.0)
MCV: 78.7 fL — ABNORMAL LOW (ref 80.0–100.0)
MPV: 11.3 fL (ref 7.5–12.5)
Monocytes Relative: 9.2 %
Neutro Abs: 2747 cells/uL (ref 1500–7800)
Neutrophils Relative %: 41 %
Platelets: 181 10*3/uL (ref 140–400)
RBC: 5.31 10*6/uL — ABNORMAL HIGH (ref 3.80–5.10)
RDW: 16 % — ABNORMAL HIGH (ref 11.0–15.0)
Total Lymphocyte: 46.6 %
WBC: 6.7 10*3/uL (ref 3.8–10.8)

## 2020-12-23 LAB — HEMOGLOBIN A1C
Hgb A1c MFr Bld: 6.3 % of total Hgb — ABNORMAL HIGH (ref <5.7)
Mean Plasma Glucose: 134 mg/dL
eAG (mmol/L): 7.4 mmol/L

## 2020-12-23 LAB — TSH: TSH: 1.75 mIU/L (ref 0.40–4.50)

## 2020-12-26 ENCOUNTER — Telehealth: Payer: Self-pay

## 2020-12-26 ENCOUNTER — Ambulatory Visit (INDEPENDENT_AMBULATORY_CARE_PROVIDER_SITE_OTHER): Payer: Medicare Other | Admitting: Family

## 2020-12-26 ENCOUNTER — Other Ambulatory Visit: Payer: Self-pay

## 2020-12-26 ENCOUNTER — Encounter: Payer: Self-pay | Admitting: Family

## 2020-12-26 VITALS — BP 138/70 | HR 80 | Temp 97.3°F | Resp 16 | Ht 61.0 in | Wt 165.2 lb

## 2020-12-26 DIAGNOSIS — Z1159 Encounter for screening for other viral diseases: Secondary | ICD-10-CM

## 2020-12-26 DIAGNOSIS — K219 Gastro-esophageal reflux disease without esophagitis: Secondary | ICD-10-CM | POA: Diagnosis not present

## 2020-12-26 DIAGNOSIS — J301 Allergic rhinitis due to pollen: Secondary | ICD-10-CM | POA: Diagnosis not present

## 2020-12-26 DIAGNOSIS — E785 Hyperlipidemia, unspecified: Secondary | ICD-10-CM

## 2020-12-26 DIAGNOSIS — E1142 Type 2 diabetes mellitus with diabetic polyneuropathy: Secondary | ICD-10-CM | POA: Diagnosis not present

## 2020-12-26 DIAGNOSIS — I10 Essential (primary) hypertension: Secondary | ICD-10-CM | POA: Diagnosis not present

## 2020-12-26 NOTE — Telephone Encounter (Signed)
Patient called and asked what the mg or mcg for the B 12 that Dinah had suggested for her to take she said she didn't want to get it wrong I asked Dinah and she said 1000 mcg Daily I relayed this information to the patient and she had not questions

## 2020-12-26 NOTE — Patient Instructions (Signed)

## 2020-12-26 NOTE — Progress Notes (Signed)
Provider: Marlowe Sax FNP-C   Aniello Christopoulos, Nelda Bucks, NP  Patient Care Team: Floyde Dingley, Nelda Bucks, NP as PCP - General (Family Medicine) Teena Irani, MD (Inactive) as Consulting Physician (Gastroenterology) Warden Fillers, MD as Consulting Physician (Ophthalmology) [name], MD as Consulting Physician (Ophthalmology) Mickel Fuchs, MD as Referring Physician (Psychiatry) Velora Heckler (Orthotics)  Extended Emergency Contact Information Primary Emergency Contact: [place] Phone: [phone] Relation: None  Code Status: Full Code  Goals of care: Advanced Directive information Advanced Directives 12/26/2020  Does Patient Have a Medical Advance Directive? No  Type of Advance Directive -  Does patient want to make changes to medical advance directive? -  Would patient like information on creating a medical advance directive? No - Patient declined     Chief Complaint  Patient presents with  . Medical Management of Chronic Issues    6 month follow up.  Marland Kitchen Health Maintenance    Discuss the need for Hepatitis C Screening, and Eye exam.    HPI:  Pt is a 80 y.o. female seen today for 6 months follow up medical management of chronic diseases.she has a medical history of Hypertension,Type 2 DM, Hyperlipidemia ,GERD,Seasonal Allergies,Asthma,Varicose vein among other conditions.she denies any acute issues today. Recent lab work results printed,copy given during visit and discussed with patient. Type 2 diabetes - No home log for review.Tries to be strict on her diet.sometimes bakes a cake but without any sugar.Has not been able to exercise much but states busy caring for husband who has prostate cancer.Follows up [facility] in [place] every 6 months.she would like to see another eye doctor in town for eye glasses.she was following up with Dr.Groat but would like to see another provider unclear why.States will go to [employer]. She was seen by Podiatrist Dr.Galaway 10/20/20 advised to  follow in 3 months.states has upcoming appointment next month.   GERD - states symptoms under controlled.will take over the counter antiacid on as needed basis.recent H/H stable.  Hypertension - no home readings for review.taking Carvedilol 12.5 mg tablet daily,losartan 100 mg tablet daily and amlodipine 5 mg tablet daily.reports no side effects.   Hyperlipidemia - on ezetimibe 10 mg tablet daily and Rosuvastatin 20 mg tablet daily.Recent LDL 65,TRG 47 and Chol 133 states not exercise since the COVID-19 pandemic.    Past Medical History:  Diagnosis Date  . Acute upper respiratory infections of unspecified site   . Acute upper respiratory infections of unspecified site   . Acute upper respiratory infections of unspecified site   . Allergic rhinitis due to pollen   . Candidiasis of mouth   . Disorder of bone and cartilage, unspecified   . Diverticulitis of colon (without mention of hemorrhage)(562.11)   . Essential hypertension, benign   . Essential hypertension, benign   . Extrinsic asthma, unspecified   . Herpes simplex with other ophthalmic complications   . Other abnormal glucose   . Other and unspecified hyperlipidemia   . Other and unspecified hyperlipidemia   . Other and unspecified hyperlipidemia   . Other cataract   . Other cataract   . Other dystrophy of vulva   . Other malaise and fatigue   . Rash and other nonspecific skin eruption   . Reflux esophagitis   . Thrombocytopenia, unspecified ([place])   . Type II or unspecified type diabetes mellitus without mention of complication, not stated as uncontrolled   . Type II or unspecified type diabetes mellitus without mention of complication, not stated as uncontrolled   .  Unspecified disorder of skin and subcutaneous tissue   . Unspecified essential hypertension   . Unspecified essential hypertension    Past Surgical History:  Procedure Laterality Date  . ABDOMINAL HYSTERECTOMY    . CESAREAN SECTION     [phone]   . CYST EXCISION  1974   Pilonidal Cyst excision  . EYE SURGERY Right 04/14/2017   Dr. Zadie Rhine  . KNEE ARTHROSCOPY Right    for meniscal tear  . RETINAL TEAR REPAIR CRYOTHERAPY  10/21/2017   Getting injections in right eye    Allergies  Allergen Reactions  . Crestor [Rosuvastatin Calcium]     Muscle cramps  . Latex Other (See Comments)    GLOVES  . Sulfa Antibiotics     Allergies as of 12/26/2020      Reactions   Crestor [rosuvastatin Calcium]    Muscle cramps   Latex Other (See Comments)   GLOVES   Sulfa Antibiotics       Medication List       Accurate as of December 26, 2020  9:16 AM. If you have any questions, ask your nurse or doctor.        acetaminophen 325 MG tablet Commonly known as: TYLENOL Take 325 mg by mouth daily as needed. For pain   albuterol 108 (90 Base) MCG/ACT inhaler Commonly known as: Ventolin HFA Inhale 2 puffs into the lungs every 6 (six) hours as needed for wheezing or shortness of breath.   amLODipine 5 MG tablet Commonly known as: NORVASC Take 1 tablet (5 mg total) by mouth daily.   budesonide-formoterol 160-4.5 MCG/ACT inhaler Commonly known as: Symbicort USE 2 INHALATIONS TWICE DAILY   calcium-vitamin D 500-200 MG-UNIT tablet Commonly known as: OSCAL WITH D Take 1 tablet by mouth 2 (two) times daily.   carvedilol 12.5 MG tablet Commonly known as: COREG Take one tablet by mouth once daily for blood pressure   ELDERBERRY PO Take by mouth 3 (three) times a week.   ezetimibe 10 MG tablet Commonly known as: ZETIA Take 1 tablet (10 mg total) by mouth daily.   fexofenadine 180 MG tablet Commonly known as: ALLEGRA Take 180 mg by mouth as needed.   KETOROLAC TROMETHAMINE OP Apply 1 drop to eye 4 (four) times daily. Right eye   losartan 100 MG tablet Commonly known as: Cozaar Take 1 tablet (100 mg total) by mouth daily.   Magnesium Oxide 500 MG Caps Take 2 capsules by mouth at bedtime.   mometasone 50 MCG/ACT nasal  spray Commonly known as: NASONEX Place 1 spray into the nose 2 (two) times daily as needed.   prednisoLONE acetate 1 % ophthalmic suspension Commonly known as: PRED FORTE Place 1 drop into the right eye 4 (four) times daily.   psyllium 0.52 g capsule Commonly known as: REGULOID Take 0.52 g by mouth daily.   rosuvastatin 20 MG tablet Commonly known as: CRESTOR Take one tablet three times a week, Monday, Wednesday and Friday   simethicone 80 MG chewable tablet Commonly known as: Gas-X Chew 1 tablet (80 mg total) by mouth every 6 (six) hours as needed for flatulence.   sitaGLIPtin 25 MG tablet Commonly known as: JANUVIA Take 1 tablet (25 mg total) by mouth daily.   sodium chloride 0.65 % Soln nasal spray Commonly known as: OCEAN Place 1 spray into both nostrils as needed for congestion.   ZINC PO Take 1 tablet by mouth once a week.       Review of Systems  Constitutional:  Negative for appetite change, chills, fatigue and fever.  HENT: Negative for sinus pressure, sinus pain, sneezing, sore throat and trouble swallowing.        Seasonal allergies takes allergy medication as needed    Eyes: Negative for discharge, redness and itching.  Respiratory: Negative for cough, chest tightness, shortness of breath and wheezing.   Cardiovascular: Negative for chest pain, palpitations and leg swelling.  Gastrointestinal: Negative for abdominal distention, abdominal pain, constipation, diarrhea, nausea and vomiting.  Endocrine: Negative for cold intolerance, heat intolerance, polydipsia, polyphagia and polyuria.  Genitourinary: Negative for difficulty urinating, dysuria, flank pain, frequency and urgency.       Sometimes gets up x 4 times at night  Musculoskeletal: Negative for arthralgias, gait problem and joint swelling.  Skin: Negative for color change, pallor and rash.  Neurological: Negative for dizziness, speech difficulty, weakness, light-headedness, numbness and headaches.   Hematological: Does not bruise/bleed easily.  Psychiatric/Behavioral: Negative for agitation, behavioral problems, confusion and sleep disturbance. The patient is not nervous/anxious.        Husband has prostate cancer     Immunization History  Administered Date(s) Administered  . Fluad Quad(high Dose 65+) 08/29/2019, 09/02/2020  . Influenza Split 08/25/2010, 08/26/2011, 10/04/2012  . Influenza Whole 09/24/2009  . Influenza, High Dose Seasonal PF 08/16/2018  . Influenza,inj,Quad PF,6+ Mos 09/11/2013, 09/04/2014, 11/12/2015, 10/04/2016, 09/06/2017  . Influenza-Unspecified 09/02/2018  . PFIZER SARS-COV-2 Vaccination 02/11/2020, 03/03/2020, 09/17/2020  . Pneumococcal Conjugate-13 02/17/2012  . Pneumococcal Polysaccharide-23 05/19/2016  . Tdap 02/17/2012   Pertinent  Health Maintenance Due  Topic Date Due  . OPHTHALMOLOGY EXAM  08/14/2020  . FOOT EXAM  05/29/2021  . HEMOGLOBIN A1C  06/21/2021  . INFLUENZA VACCINE  Completed  . DEXA SCAN  Completed  . PNA vac Low Risk Adult  Completed   Fall Risk  12/26/2020 07/03/2020 06/24/2020 05/22/2020 04/04/2020  Falls in the past year? 0 0 0 0 0  Number falls in past yr: 0 - 0 0 0  Injury with Fall? 0 - 0 0 0   Functional Status Survey:    Vitals:   12/26/20 0903  BP: 138/70  Pulse: 80  Resp: 16  Temp: (!) 97.3 F (36.3 C)  SpO2: 95%  Weight: 165 lb 3.2 oz (74.9 kg)  Height: _0  (1.549 m)   Body mass index is 31.21 kg/m. Physical Exam Vitals reviewed.  Constitutional:      General: She is not in acute distress.    Appearance: She is obese. She is not ill-appearing.  HENT:     Head: Normocephalic.     Right Ear: Tympanic membrane, ear canal and external ear normal. There is no impacted cerumen.     Left Ear: Tympanic membrane, ear canal and external ear normal. There is no impacted cerumen.     Nose: Congestion present. No rhinorrhea.     Mouth/Throat:     Mouth: Mucous membranes are moist.     Pharynx: Oropharynx is clear.  No oropharyngeal exudate or posterior oropharyngeal erythema.  Eyes:     General: No scleral icterus.       Right eye: No discharge.        Left eye: No discharge.     Extraocular Movements: Extraocular movements intact.     Conjunctiva/sclera: Conjunctivae normal.     Pupils: Pupils are equal, round, and reactive to light.  Neck:     Vascular: No carotid bruit.  Cardiovascular:     Rate and Rhythm: Normal rate and  regular rhythm.     Pulses: Normal pulses.     Heart sounds: Normal heart sounds. No murmur heard. No friction rub. No gallop.   Pulmonary:     Effort: Pulmonary effort is normal. No respiratory distress.     Breath sounds: Normal breath sounds. No wheezing, rhonchi or rales.  Chest:     Chest wall: No tenderness.  Abdominal:     General: Bowel sounds are normal. There is no distension.     Palpations: Abdomen is soft. There is no mass.     Tenderness: There is no abdominal tenderness. There is no right CVA tenderness, left CVA tenderness, guarding or rebound.  Musculoskeletal:        General: No swelling or tenderness. Normal range of motion.     Cervical back: Normal range of motion. No rigidity or tenderness.     Right lower leg: No edema.     Left lower leg: No edema.  Lymphadenopathy:     Cervical: No cervical adenopathy.  Skin:    General: Skin is warm and dry.     Coloration: Skin is not jaundiced or pale.     Findings: No bruising, erythema, lesion or rash.  Neurological:     Mental Status: She is alert and oriented to person, place, and time.     Cranial Nerves: No cranial nerve deficit.     Sensory: No sensory deficit.     Motor: No weakness.     Coordination: Coordination normal.     Gait: Gait normal.  Psychiatric:        Mood and Affect: Mood normal.        Speech: Speech normal.        Behavior: Behavior normal.        Thought Content: Thought content normal.        Judgment: Judgment normal.    Labs reviewed: Recent Labs    02/18/20 0806  06/18/20 0816 12/22/20 0828  NA 139 140 144  K 4.1 4.1 4.5  CL 104 105 107  CO2 _0 GLUCOSE 110* 114* 125*  BUN _1 CREATININE 0.67 0.68 0.77  CALCIUM 9.6 9.6 9.9   Recent Labs    02/18/20 0806 06/18/20 0816 12/22/20 0828  AST _2 ALT _3 BILITOT 0.4 0.5 0.6  PROT 7.1 7.2 7.2   Recent Labs    02/18/20 0806 06/18/20 0816 12/22/20 0828  WBC 5.7 6.2 6.7  NEUTROABS 2,058 2,288 2,747  HGB 13.5 13.3 13.9  HCT 41.0 40.1 41.8  MCV 79.2* 78.0* 78.7*  PLT 201 152 181   Lab Results  Component Value Date   TSH 1.75 12/22/2020   Lab Results  Component Value Date   HGBA1C 6.3 (H) 12/22/2020   Lab Results  Component Value Date   CHOL 133 12/22/2020   HDL 55 12/22/2020   LDLCALC 65 12/22/2020   TRIG 47 12/22/2020   CHOLHDL 2.4 12/22/2020    Significant Diagnostic Results in last 30 days:  No results found.  Assessment/Plan 1. Essential hypertension B/p at goal. - continue on Amlodipine,coreg and losartan. - continue on statin. - CBC with Differential/Platelet; Future - CMP with eGFR([facility]); Future - TSH; Future  2. Controlled type 2 diabetes mellitus with diabetic polyneuropathy, without long-term current use of insulin ([facility]) Lab Results  Component Value Date   HGBA1C 6.3 (H) 12/22/2020   No home CBG for review.No signs of hypoglycemia reported. - continue  on Januvia 25 mg tablet daily. - dietary and lifestyle modification advised.Unable to restart her Gym exercises taking care of husband who has prostate Cancer.  - Hemoglobin A1c; Future  3. Hyperlipidemia LDL goal <100 LDL at goal. - continue on zetia and Crestor  - continue on low carbohydrates,low saturated fats and high vegetable diet - Lipid panel; Future  4. Gastroesophageal reflux disease without esophagitis Asymptomatic.off medication.will continue on OTC antiacid as needed.  H/H stable and no blood in the stool.   5. Seasonal allergic rhinitis due to pollen Symptoms  controlled on Allegra PRN,nasonex ,saline spray   6. Encounter for hepatitis C screening test for low risk patient Low risk  - Hep C Antibody; Future  Family/ staff Communication: Reviewed plan of care with patient  Labs/tests ordered:   - CBC with Differential/Platelet; Future - CMP with eGFR([facility]); Future - TSH; Future - Hep C Antibody; Future - Lipid panel; Future - Hemoglobin A1c; Future  Next Appointment : 6 months for  medical management of chronic issues.Fasting labs prior to visit.  Sandrea Hughs, NP

## 2020-12-31 ENCOUNTER — Encounter: Payer: Self-pay | Admitting: Family

## 2020-12-31 ENCOUNTER — Telehealth: Payer: Self-pay

## 2020-12-31 ENCOUNTER — Other Ambulatory Visit: Payer: Self-pay

## 2020-12-31 ENCOUNTER — Telehealth (INDEPENDENT_AMBULATORY_CARE_PROVIDER_SITE_OTHER): Payer: Medicare Other | Admitting: Family

## 2020-12-31 DIAGNOSIS — R0989 Other specified symptoms and signs involving the circulatory and respiratory systems: Secondary | ICD-10-CM

## 2020-12-31 MED ORDER — ZINC GLUCONATE 50 MG PO TABS: 50.0000 mg | ORAL_TABLET | Freq: Every day | ORAL | 0 refills | Status: AC

## 2020-12-31 MED ORDER — AZITHROMYCIN 250 MG PO TABS: ORAL_TABLET | ORAL | 0 refills | Status: DC

## 2020-12-31 MED ORDER — ASCORBIC ACID 500 MG PO TABS: 500.0000 mg | ORAL_TABLET | Freq: Two times a day (BID) | ORAL | 0 refills | Status: AC

## 2020-12-31 MED ORDER — VITAMIN D 125 MCG (5000 UT) PO CAPS: 1.0000 | ORAL_CAPSULE | Freq: Every day | ORAL | 0 refills | Status: AC

## 2020-12-31 NOTE — Progress Notes (Signed)
This service is provided via telemedicine  No vital signs collected/recorded due to the encounter was a telemedicine visit.   Location of patient (ex: home, work): Home.  Patient consents to a telephone visit: Yes.  Location of the provider (ex: office, home): [facility].  Name of any referring provider: Marykate Heuberger, Nelda Bucks, NP   Names of all persons participating in the telemedicine service and their role in the encounter: Patient, [name], [place], [place], [name], NP.    Time spent on call: 8 minutes spent on the phone with Medical Assistant.    Location:      Place of Service:    Provider: Zamiyah Resendes FNP-C  Devorah Givhan, Nelda Bucks, NP  Patient Care Team: Tishana Clinkenbeard, Nelda Bucks, NP as PCP - General (Family Medicine) Teena Irani, MD (Inactive) as Consulting Physician (Gastroenterology) Warden Fillers, MD as Consulting Physician (Ophthalmology) [name], MD as Consulting Physician (Ophthalmology) Mickel Fuchs, MD as Referring Physician (Psychiatry) Velora Heckler (Orthotics)  Extended Emergency Contact Information Primary Emergency Contact: [place] Phone: [phone] Relation: None  Code Status:  Full Code  Goals of care: Advanced Directive information Advanced Directives 12/31/2020  Does Patient Have a Medical Advance Directive? No  Type of Advance Directive -  Does patient want to make changes to medical advance directive? -  Would patient like information on creating a medical advance directive? No - Patient declined     Chief Complaint  Patient presents with  . Acute Visit    Complains of head congestion, fever, body aches, coughing, sore throat, and ears hurting x 5 days.     HPI:  Pt is a 80 y.o. female seen today for an acute visit for evaluation of head congestion,fever,body aches,coughing ,sore throat and ears hurting x 5 days.sneezing.blowing yellow with some blood.last night had a fever but did not check temp.this morning temp  was 97.7.Took tylenol 500 mg tablet last night. Has not been in contact with any person sick with COVID-19 lives alone with husband.she denies any chest tightness,chest pain or shortness of breath.  Recommended get COVID -19 test but declines states does not want to get out due to driving conditions.    She was scheduled for a video visit today but was unable to connect with provider on video.visit changed to Telephone visit.     Past Medical History:  Diagnosis Date  . Acute upper respiratory infections of unspecified site   . Acute upper respiratory infections of unspecified site   . Acute upper respiratory infections of unspecified site   . Allergic rhinitis due to pollen   . Candidiasis of mouth   . Disorder of bone and cartilage, unspecified   . Diverticulitis of colon (without mention of hemorrhage)(562.11)   . Essential hypertension, benign   . Essential hypertension, benign   . Extrinsic asthma, unspecified   . Herpes simplex with other ophthalmic complications   . Other abnormal glucose   . Other and unspecified hyperlipidemia   . Other and unspecified hyperlipidemia   . Other and unspecified hyperlipidemia   . Other cataract   . Other cataract   . Other dystrophy of vulva   . Other malaise and fatigue   . Rash and other nonspecific skin eruption   . Reflux esophagitis   . Thrombocytopenia, unspecified ([place])   . Type II or unspecified type diabetes mellitus without mention of complication, not stated as uncontrolled   . Type II or unspecified type diabetes mellitus without mention of complication,  not stated as uncontrolled   . Unspecified disorder of skin and subcutaneous tissue   . Unspecified essential hypertension   . Unspecified essential hypertension    Past Surgical History:  Procedure Laterality Date  . ABDOMINAL HYSTERECTOMY    . CESAREAN SECTION     [phone]  . CYST EXCISION  1974   Pilonidal Cyst excision  . EYE SURGERY Right 04/14/2017    Dr. Zadie Rhine  . KNEE ARTHROSCOPY Right    for meniscal tear  . RETINAL TEAR REPAIR CRYOTHERAPY  10/21/2017   Getting injections in right eye    Allergies  Allergen Reactions  . Crestor [Rosuvastatin Calcium]     Muscle cramps  . Latex Other (See Comments)    GLOVES  . Sulfa Antibiotics     Outpatient Encounter Medications as of 12/31/2020  Medication Sig  . acetaminophen (TYLENOL) 325 MG tablet Take 325 mg by mouth daily as needed. For pain  . albuterol (VENTOLIN HFA) 108 (90 Base) MCG/ACT inhaler Inhale 2 puffs into the lungs every 6 (six) hours as needed for wheezing or shortness of breath.  Marland Kitchen amLODipine (NORVASC) 5 MG tablet Take 1 tablet (5 mg total) by mouth daily.  . budesonide-formoterol (SYMBICORT) 160-4.5 MCG/ACT inhaler USE 2 INHALATIONS TWICE DAILY  . calcium-vitamin D (OSCAL WITH D) 500-200 MG-UNIT per tablet Take 1 tablet by mouth 2 (two) times daily.  . carvedilol (COREG) 12.5 MG tablet Take one tablet by mouth once daily for blood pressure  . ELDERBERRY PO Take by mouth 3 (three) times a week.   . ezetimibe (ZETIA) 10 MG tablet Take 1 tablet (10 mg total) by mouth daily.  . fexofenadine (ALLEGRA) 180 MG tablet Take 180 mg by mouth as needed.   Marland Kitchen KETOROLAC TROMETHAMINE OP Apply 1 drop to eye 4 (four) times daily. Right eye  . losartan (COZAAR) 100 MG tablet Take 1 tablet (100 mg total) by mouth daily.  . Magnesium Oxide 500 MG CAPS Take 2 capsules by mouth at bedtime.  . mometasone (NASONEX) 50 MCG/ACT nasal spray Place 1 spray into the nose 2 (two) times daily as needed.  . Multiple Vitamins-Minerals (ZINC PO) Take 1 tablet by mouth once a week.  . prednisoLONE acetate (PRED FORTE) 1 % ophthalmic suspension Place 1 drop into the right eye 4 (four) times daily.   . psyllium (REGULOID) 0.52 g capsule Take 0.52 g by mouth daily.  . rosuvastatin (CRESTOR) 20 MG tablet Take one tablet three times a week, Monday, Wednesday and Friday  . simethicone (GAS-X) 80 MG chewable  tablet Chew 1 tablet (80 mg total) by mouth every 6 (six) hours as needed for flatulence.  . sitaGLIPtin (JANUVIA) 25 MG tablet Take 1 tablet (25 mg total) by mouth daily.  . sodium chloride (OCEAN) 0.65 % SOLN nasal spray Place 1 spray into both nostrils as needed for congestion.   No facility-administered encounter medications on file as of 12/31/2020.    Review of Systems  Constitutional: Positive for fever. Negative for appetite change, chills and fatigue.       Generalized body aches   HENT: Positive for congestion, ear pain, rhinorrhea, sneezing and sore throat. Negative for ear discharge, hearing loss, sinus pressure and sinus pain.   Eyes: Negative for discharge, redness and itching.  Respiratory: Positive for cough. Negative for chest tightness, shortness of breath and wheezing.   Cardiovascular: Negative for chest pain, palpitations and leg swelling.  Gastrointestinal: Negative for abdominal distention, abdominal pain, diarrhea, nausea and vomiting.  Neurological: Negative for dizziness, speech difficulty, light-headedness and headaches.    Immunization History  Administered Date(s) Administered  . Fluad Quad(high Dose 65+) 08/29/2019, 09/02/2020  . Influenza Split 08/25/2010, 08/26/2011, 10/04/2012  . Influenza Whole 09/24/2009  . Influenza, High Dose Seasonal PF 08/16/2018  . Influenza,inj,Quad PF,6+ Mos 09/11/2013, 09/04/2014, 11/12/2015, 10/04/2016, 09/06/2017  . Influenza-Unspecified 09/02/2018  . PFIZER(Purple Top)SARS-COV-2 Vaccination 02/11/2020, 03/03/2020, 09/17/2020  . Pneumococcal Conjugate-13 02/17/2012  . Pneumococcal Polysaccharide-23 05/19/2016  . Tdap 02/17/2012   Pertinent  Health Maintenance Due  Topic Date Due  . FOOT EXAM  05/29/2021  . HEMOGLOBIN A1C  06/21/2021  . OPHTHALMOLOGY EXAM  07/02/2021  . INFLUENZA VACCINE  Completed  . DEXA SCAN  Completed  . PNA vac Low Risk Adult  Completed   Fall Risk  12/31/2020 12/26/2020 07/03/2020 06/24/2020  05/22/2020  Falls in the past year? 0 0 0 0 0  Number falls in past yr: 0 0 - 0 0  Injury with Fall? 0 0 - 0 0   Functional Status Survey:    There were no vitals filed for this visit. There is no height or weight on file to calculate BMI. Physical Exam Unable to complete on telephone visit.   Labs reviewed: Recent Labs    02/18/20 0806 06/18/20 0816 12/22/20 0828  NA 139 140 144  K 4.1 4.1 4.5  CL 104 105 107  CO2 28 27 28   GLUCOSE 110* 114* 125*  BUN 14 14 18   CREATININE 0.67 0.68 0.77  CALCIUM 9.6 9.6 9.9   Recent Labs    02/18/20 0806 06/18/20 0816 12/22/20 0828  AST 11 14 15   ALT 13 17 14   BILITOT 0.4 0.5 0.6  PROT 7.1 7.2 7.2   Recent Labs    02/18/20 0806 06/18/20 0816 12/22/20 0828  WBC 5.7 6.2 6.7  NEUTROABS 2,058 2,288 2,747  HGB 13.5 13.3 13.9  HCT 41.0 40.1 41.8  MCV 79.2* 78.0* 78.7*  PLT 201 152 181   Lab Results  Component Value Date   TSH 1.75 12/22/2020   Lab Results  Component Value Date   HGBA1C 6.3 (H) 12/22/2020   Lab Results  Component Value Date   CHOL 133 12/22/2020   HDL 55 12/22/2020   LDLCALC 65 12/22/2020   TRIG 47 12/22/2020   CHOLHDL 2.4 12/22/2020    Significant Diagnostic Results in last 30 days:  No results found.  Assessment/Plan   Symptoms of upper respiratory infection (URI) Reports tactile fever last night took tylenol with relief temp 97.7 this morning. Reports cough,runny nose,sneezing nasal congestion,sore throat,generalized body aches and ear pain. Recommended testing for COVID-19 due to similar symptoms with COVID-19 but has declined does not want to drive out due to road conditions.will treat clinically. - Advised to increase fluid intake/soups  - continue on tylenol as needed for generalized body aches or fever  - Take vitamin C 500 mg tablet one by mouth twice daily x 14 days  -Vitamin D 5000 units one by mouth daily x 14 days  - Zinc 50 mg tablet one by mouth daily x 14 days  - Z-pak 250 mg  tablet 2 tablet x 1 dose then 250 mg tablet daily x 4 days and stop. - ascorbic acid (VITAMIN C) 500 MG tablet; Take 1 tablet (500 mg total) by mouth 2 (two) times daily for 14 days.  Dispense: 28 tablet; Refill: 0 - Cholecalciferol (VITAMIN D) 125 MCG (5000 UT) CAPS; Take 1 capsule by mouth daily for  14 days.  Dispense: 14 capsule; Refill: 0 - zinc gluconate 50 MG tablet; Take 1 tablet (50 mg total) by mouth daily for 14 days.  Dispense: 14 tablet; Refill: 0 - azithromycin (ZITHROMAX) 250 MG tablet; Take 2 tablets (500 mg ) by mouth x 1 dose then one tablet ( 250 mg ) by mouth daily x 4 days  Dispense: 6 tablet; Refill: 0  - Notify provider or go to ED if symptoms worsen or develop shortness of breath.   Family/ staff Communication: Reviewed plan of care with patient verbalized understanding wrote all recommendation and read back to provider.  Labs/tests ordered: None   Next Appointment: As needed if symptoms worsen or fail to improve.   I connected with  Tanya Bell on 12/31/20 by a Telephone enabled telemedicine application and verified that I am speaking with the correct person using two identifiers.   I discussed the limitations of evaluation and management by telemedicine. The patient expressed understanding and agreed to proceed.   Spent 15 minutes of non-face to face on telephone with patient    Sandrea Hughs, NP

## 2020-12-31 NOTE — Telephone Encounter (Signed)
[name] - Tanya Bell are scheduled for a virtual visit with your provider today.    Just as we do with appointments in the office, we must obtain your consent to participate.  Your consent will be active for this visit and any virtual visit you may have with one of our providers in the next 365 days.    If you have a MyChart account, I can also send a copy of this consent to you electronically.  All virtual visits are billed to your insurance company just like a traditional visit in the office.  As this is a virtual visit, video technology does not allow for your provider to perform a traditional examination.  This may limit your provider's ability to fully assess your condition.  If your provider identifies any concerns that need to be evaluated in person or the need to arrange testing such as labs, EKG, etc, we will make arrangements to do so.    Although advances in technology are sophisticated, we cannot ensure that it will always work on either your end or our end.  If the connection with a video visit is poor, we may have to switch to a telephone visit.  With either a video or telephone visit, we are not always able to ensure that we have a secure connection.   I need to obtain your verbal consent now.   Are you willing to proceed with your visit today?   Tanya Bell - Tanya Bell has provided verbal consent on 12/31/2020 for a virtual visit (video or telephone).   [name], [place] 12/31/2020  9:00 AM

## 2020-12-31 NOTE — Patient Instructions (Addendum)
-   Increase water intake/ warm tea or soups for hydration    - ascorbic acid (VITAMIN C) 500 MG tablet; Take 1 tablet (500 mg total) by mouth 2 (two) times daily for 14 days.  Dispense: 28 tablet; Refill: 0  - Cholecalciferol (VITAMIN D) 125 MCG (5000 UT) CAPS; Take 1 capsule by mouth daily for 14 days.  Dispense: 14 capsule; Refill: 0  - zinc gluconate 50 MG tablet; Take 1 tablet (50 mg total) by mouth daily for 14 days.  Dispense: 14 tablet; Refill: 0  - azithromycin (ZITHROMAX) 250 MG tablet; Take 2 tablets (500 mg ) by mouth x 1 dose then one tablet ( 250 mg ) by mouth daily x 4 days  Dispense: 6 tablet; Refill: 0   - I  recommend getting tested for COVID-19 at your pharmacy or other testing site.continue to follow CDC guidelines wearing facial mask,washing hands and self distancing.   - Notify provider or go to ED if symptoms worsen or develop shortness of breath.

## 2021-01-09 ENCOUNTER — Encounter: Payer: Medicare Other | Admitting: Family

## 2021-01-12 ENCOUNTER — Ambulatory Visit (INDEPENDENT_AMBULATORY_CARE_PROVIDER_SITE_OTHER): Payer: Medicare Other | Admitting: Family

## 2021-01-12 ENCOUNTER — Other Ambulatory Visit: Payer: Self-pay

## 2021-01-12 ENCOUNTER — Encounter: Payer: Self-pay | Admitting: Family

## 2021-01-12 DIAGNOSIS — Z0000 Encounter for general adult medical examination without abnormal findings: Secondary | ICD-10-CM | POA: Diagnosis not present

## 2021-01-12 NOTE — Progress Notes (Signed)
    This service is provided via telemedicine ° °No vital signs collected/recorded due to the encounter was a telemedicine visit.  ° °Location of patient (ex: home, work): Home. ° °Patient consents to a telephone visit: Yes. ° °Location of the provider (ex: office, home): [facility]. ° °Name of any referring provider: Ngetich, Dinah C, NP  ° °Names of all persons participating in the telemedicine service and their role in the encounter: Patient, Bonnye Halle, RMA, Ngetich, Dinah, NP.   ° °Time spent on call: 8 minutes spent on the phone with Medical Assistant.   °

## 2021-01-12 NOTE — Patient Instructions (Signed)
[name] - Tanya Bell , Thank you for taking time to come for your Medicare Wellness Visit. I appreciate your ongoing commitment to your health goals. Please review the following plan we discussed and let me know if I can assist you in the future.   Screening recommendations/referrals: Colonoscopy: Up to date  Mammogram: Up to date  Bone Density: Up to date  Recommended yearly ophthalmology/optometry visit for glaucoma screening and checkup Recommended yearly dental visit for hygiene and checkup  Vaccinations: Influenza vaccine: Up to date  Pneumococcal vaccine : Up to date  Tdap vaccine : Up to date  Shingles vaccine:  - Advised to get her shingles vaccine at her pharmacy  Advanced directives: No   Conditions/risks identified: Advance age female > 79 yrs,sedentary lifestyle,BMI > 30 ,Hx of smoking ,Hypertension   Next appointment: 1 year     Preventive Care 79 Years and Older, Female Preventive care refers to lifestyle choices and visits with your health care provider that can promote health and wellness. What does preventive care include?  A yearly physical exam. This is also called an annual well check.  Dental exams once or twice a year.  Routine eye exams. Ask your health care provider how often you should have your eyes checked.  Personal lifestyle choices, including:  Daily care of your teeth and gums.  Regular physical activity.  Eating a healthy diet.  Avoiding tobacco and drug use.  Limiting alcohol use.  Practicing safe sex.  Taking low-dose aspirin every day.  Taking vitamin and mineral supplements as recommended by your health care provider. What happens during an annual well check? The services and screenings done by your health care provider during your annual well check will depend on your age, overall health, lifestyle risk factors, and family history of disease. Counseling  Your health care provider may ask you questions about your:  Alcohol  use.  Tobacco use.  Drug use.  Emotional well-being.  Home and relationship well-being.  Sexual activity.  Eating habits.  History of falls.  Memory and ability to understand (cognition).  Work and work Statistician.  Reproductive health. Screening  You may have the following tests or measurements:  Height, weight, and BMI.  Blood pressure.  Lipid and cholesterol levels. These may be checked every 5 years, or more frequently if you are over 79 years old.  Skin check.  Lung cancer screening. You may have this screening every year starting at age 79 if you have a 30-pack-year history of smoking and currently smoke or have quit within the past 15 years.  Fecal occult blood test (FOBT) of the stool. You may have this test every year starting at age 79.  Flexible sigmoidoscopy or colonoscopy. You may have a sigmoidoscopy every 5 years or a colonoscopy every 10 years starting at age 79.  Hepatitis C blood test.  Hepatitis B blood test.  Sexually transmitted disease (STD) testing.  Diabetes screening. This is done by checking your blood sugar (glucose) after you have not eaten for a while (fasting). You may have this done every 1-3 years.  Bone density scan. This is done to screen for osteoporosis. You may have this done starting at age 79.  Mammogram. This may be done every 1-2 years. Talk to your health care provider about how often you should have regular mammograms. Talk with your health care provider about your test results, treatment options, and if necessary, the need for more tests. Vaccines  Your health care provider may recommend  certain vaccines, such as:  Influenza vaccine. This is recommended every year.  Tetanus, diphtheria, and acellular pertussis (Tdap, Td) vaccine. You may need a Td booster every 10 years.  Zoster vaccine. You may need this after age 60.  Pneumococcal 13-valent conjugate (PCV13) vaccine. One dose is recommended after age  79.  Pneumococcal polysaccharide (PPSV23) vaccine. One dose is recommended after age 79. Talk to your health care provider about which screenings and vaccines you need and how often you need them. This information is not intended to replace advice given to you by your health care provider. Make sure you discuss any questions you have with your health care provider. Document Released: 12/26/2015 Document Revised: 08/18/2016 Document Reviewed: 09/30/2015 Elsevier Interactive Patient Education  2017 [place] Prevention in the Home Falls can cause injuries. They can happen to people of all ages. There are many things you can do to make your home safe and to help prevent falls. What can I do on the outside of my home?  Regularly fix the edges of walkways and driveways and fix any cracks.  Remove anything that might make you trip as you walk through a door, such as a raised step or threshold.  Trim any bushes or trees on the path to your home.  Use bright outdoor lighting.  Clear any walking paths of anything that might make someone trip, such as rocks or tools.  Regularly check to see if handrails are loose or broken. Make sure that both sides of any steps have handrails.  Any raised decks and porches should have guardrails on the edges.  Have any leaves, snow, or ice cleared regularly.  Use sand or salt on walking paths during winter.  Clean up any spills in your garage right away. This includes oil or grease spills. What can I do in the bathroom?  Use night lights.  Install grab bars by the toilet and in the tub and shower. Do not use towel bars as grab bars.  Use non-skid mats or decals in the tub or shower.  If you need to sit down in the shower, use a plastic, non-slip stool.  Keep the floor dry. Clean up any water that spills on the floor as soon as it happens.  Remove soap buildup in the tub or shower regularly.  Attach bath mats securely with double-sided  non-slip rug tape.  Do not have throw rugs and other things on the floor that can make you trip. What can I do in the bedroom?  Use night lights.  Make sure that you have a light by your bed that is easy to reach.  Do not use any sheets or blankets that are too big for your bed. They should not hang down onto the floor.  Have a firm chair that has side arms. You can use this for support while you get dressed.  Do not have throw rugs and other things on the floor that can make you trip. What can I do in the kitchen?  Clean up any spills right away.  Avoid walking on wet floors.  Keep items that you use a lot in easy-to-reach places.  If you need to reach something above you, use a strong step stool that has a grab bar.  Keep electrical cords out of the way.  Do not use floor polish or wax that makes floors slippery. If you must use wax, use non-skid floor wax.  Do not have throw rugs and other  things on the floor that can make you trip. What can I do with my stairs?  Do not leave any items on the stairs.  Make sure that there are handrails on both sides of the stairs and use them. Fix handrails that are broken or loose. Make sure that handrails are as long as the stairways.  Check any carpeting to make sure that it is firmly attached to the stairs. Fix any carpet that is loose or worn.  Avoid having throw rugs at the top or bottom of the stairs. If you do have throw rugs, attach them to the floor with carpet tape.  Make sure that you have a light switch at the top of the stairs and the bottom of the stairs. If you do not have them, ask someone to add them for you. What else can I do to help prevent falls?  Wear shoes that:  Do not have high heels.  Have rubber bottoms.  Are comfortable and fit you well.  Are closed at the toe. Do not wear sandals.  If you use a stepladder:  Make sure that it is fully opened. Do not climb a closed stepladder.  Make sure that both  sides of the stepladder are locked into place.  Ask someone to hold it for you, if possible.  Clearly mark and make sure that you can see:  Any grab bars or handrails.  First and last steps.  Where the edge of each step is.  Use tools that help you move around (mobility aids) if they are needed. These include:  Canes.  Walkers.  Scooters.  Crutches.  Turn on the lights when you go into a dark area. Replace any light bulbs as soon as they burn out.  Set up your furniture so you have a clear path. Avoid moving your furniture around.  If any of your floors are uneven, fix them.  If there are any pets around you, be aware of where they are.  Review your medicines with your doctor. Some medicines can make you feel dizzy. This can increase your chance of falling. Ask your doctor what other things that you can do to help prevent falls. This information is not intended to replace advice given to you by your health care provider. Make sure you discuss any questions you have with your health care provider. Document Released: 09/25/2009 Document Revised: 05/06/2016 Document Reviewed: 01/03/2015 Elsevier Interactive Patient Education  2017 [employer].

## 2021-01-12 NOTE — Progress Notes (Signed)
Subjective:   Tanya Bell is a 80 y.o. female who presents for Medicare Annual (Subsequent) preventive examination.  Review of Systems     Cardiac Risk Factors include: advanced age (>26men, >50 women);sedentary lifestyle;obesity (BMI >30kg/m2);smoking/ tobacco exposure;hypertension     Objective:    There were no vitals filed for this visit. There is no height or weight on file to calculate BMI.  Advanced Directives 01/12/2021 12/31/2020 12/26/2020 06/24/2020 05/29/2020 05/22/2020 02/21/2020  Does Patient Have a Medical Advance Directive? No No No No No Yes No  Type of Advance Directive - - - - - Out of facility DNR (pink MOST or yellow form) -  Does patient want to make changes to medical advance directive? - - - No - Patient declined No - Patient declined No - Patient declined -  Would patient like information on creating a medical advance directive? No - Patient declined No - Patient declined No - Patient declined - - - No - Patient declined    Current Medications (verified) Outpatient Encounter Medications as of 01/12/2021  Medication Sig  . acetaminophen (TYLENOL) 325 MG tablet Take 325 mg by mouth daily as needed. For pain  . albuterol (VENTOLIN HFA) 108 (90 Base) MCG/ACT inhaler Inhale 2 puffs into the lungs every 6 (six) hours as needed for wheezing or shortness of breath.  Marland Kitchen amLODipine (NORVASC) 5 MG tablet Take 1 tablet (5 mg total) by mouth daily.  Marland Kitchen ascorbic acid (VITAMIN C) 500 MG tablet Take 1 tablet (500 mg total) by mouth 2 (two) times daily for 14 days.  . budesonide-formoterol (SYMBICORT) 160-4.5 MCG/ACT inhaler USE 2 INHALATIONS TWICE DAILY  . calcium-vitamin D (OSCAL WITH D) 500-200 MG-UNIT per tablet Take 1 tablet by mouth 2 (two) times daily.  . carvedilol (COREG) 12.5 MG tablet Take one tablet by mouth once daily for blood pressure  . Cholecalciferol (VITAMIN D) 125 MCG (5000 UT) CAPS Take 1 capsule by mouth daily for 14 days.  Marland Kitchen ELDERBERRY PO Take by  mouth 3 (three) times a week.   . fexofenadine (ALLEGRA) 180 MG tablet Take 180 mg by mouth as needed.   Marland Kitchen KETOROLAC TROMETHAMINE OP Apply 1 drop to eye 4 (four) times daily. Right eye  . losartan (COZAAR) 100 MG tablet Take 1 tablet (100 mg total) by mouth daily.  . Magnesium Oxide 500 MG CAPS Take 2 capsules by mouth at bedtime.  . mometasone (NASONEX) 50 MCG/ACT nasal spray Place 1 spray into the nose 2 (two) times daily as needed.  . Multiple Vitamins-Minerals (ZINC PO) Take 1 tablet by mouth once a week.  . prednisoLONE acetate (PRED FORTE) 1 % ophthalmic suspension Place 1 drop into the right eye 4 (four) times daily.   . psyllium (REGULOID) 0.52 g capsule Take 0.52 g by mouth daily.  . rosuvastatin (CRESTOR) 20 MG tablet Take one tablet three times a week, Monday, Wednesday and Friday  . simethicone (GAS-X) 80 MG chewable tablet Chew 1 tablet (80 mg total) by mouth every 6 (six) hours as needed for flatulence.  . sitaGLIPtin (JANUVIA) 25 MG tablet Take 1 tablet (25 mg total) by mouth daily.  . sodium chloride (OCEAN) 0.65 % SOLN nasal spray Place 1 spray into both nostrils as needed for congestion.  Marland Kitchen zinc gluconate 50 MG tablet Take 1 tablet (50 mg total) by mouth daily for 14 days.  . [DISCONTINUED] azithromycin (ZITHROMAX) 250 MG tablet Take 2 tablets (500 mg ) by mouth x 1  dose then one tablet ( 250 mg ) by mouth daily x 4 days  . [DISCONTINUED] ezetimibe (ZETIA) 10 MG tablet Take 1 tablet (10 mg total) by mouth daily.   No facility-administered encounter medications on file as of 01/12/2021.    Allergies (verified) Crestor [rosuvastatin calcium], Latex, and Sulfa antibiotics   History: Past Medical History:  Diagnosis Date  . Acute upper respiratory infections of unspecified site   . Acute upper respiratory infections of unspecified site   . Acute upper respiratory infections of unspecified site   . Allergic rhinitis due to pollen   . Candidiasis of mouth   . Disorder of  bone and cartilage, unspecified   . Diverticulitis of colon (without mention of hemorrhage)(562.11)   . Essential hypertension, benign   . Essential hypertension, benign   . Extrinsic asthma, unspecified   . Herpes simplex with other ophthalmic complications   . Other abnormal glucose   . Other and unspecified hyperlipidemia   . Other and unspecified hyperlipidemia   . Other and unspecified hyperlipidemia   . Other cataract   . Other cataract   . Other dystrophy of vulva   . Other malaise and fatigue   . Rash and other nonspecific skin eruption   . Reflux esophagitis   . Thrombocytopenia, unspecified (HCC)   . Type II or unspecified type diabetes mellitus without mention of complication, not stated as uncontrolled   . Type II or unspecified type diabetes mellitus without mention of complication, not stated as uncontrolled   . Unspecified disorder of skin and subcutaneous tissue   . Unspecified essential hypertension   . Unspecified essential hypertension    Past Surgical History:  Procedure Laterality Date  . ABDOMINAL HYSTERECTOMY    . CESAREAN SECTION     [phone],1964,1971,1972  . CYST EXCISION  1974   Pilonidal Cyst excision  . EYE SURGERY Right 04/14/2017   Dr. Luciana Axeankin  . KNEE ARTHROSCOPY Right    for meniscal tear  . RETINAL TEAR REPAIR CRYOTHERAPY  10/21/2017   Getting injections in right eye   Family History  Problem Relation Age of Onset  . Cancer Mother        brain  . Stroke Father    Social History   Socioeconomic History  . Marital status: Married    Spouse name: Not on file  . Number of children: Not on file  . Years of education: Not on file  . Highest education level: Not on file  Occupational History  . Not on file  Tobacco Use  . Smoking status: Former Smoker    Types: Cigarettes    Quit date: 12/13/1990    Years since quitting: 30.1  . Smokeless tobacco: Never Used  Vaping Use  . Vaping Use: Never used  Substance and Sexual Activity  . Alcohol  use: No    Alcohol/week: 0.0 standard drinks  . Drug use: No  . Sexual activity: Yes    Partners: Male    Comment: None   Other Topics Concern  . Not on file  Social History Narrative  . Not on file   Social Determinants of Health   Financial Resource Strain: Not on file  Food Insecurity: Not on file  Transportation Needs: Not on file  Physical Activity: Not on file  Stress: Not on file  Social Connections: Not on file    Tobacco Counseling Counseling given: Not Answered   Clinical Intake:  Pre-visit preparation completed: No  Pain : No/denies pain  BMI - recorded: 31.21 Nutritional Status: BMI > 30  Obese Nutritional Risks: None Diabetes: Yes CBG done?: Yes (90) CBG resulted in Enter/ Edit results?: Yes (read back in the 90's) Did pt. bring in CBG monitor from home?: No (Telephone visit)  How often do you need to have someone help you when you read instructions, pamphlets, or other written materials from your doctor or pharmacy?: 1 - Never What is the last grade level you completed in school?: GED with medical secretary  Diabetic?yes   Interpreter Needed?: No     Activities of Daily Living In your present state of health, do you have any difficulty performing the following activities: 01/12/2021  Hearing? N  Vision? N  Difficulty concentrating or making decisions? N  Walking or climbing stairs? N  Dressing or bathing? N  Doing errands, shopping? N  Preparing Food and eating ? N  Using the Toilet? N  In the past six months, have you accidently leaked urine? N  Do you have problems with loss of bowel control? N  Managing your Medications? N  Managing your Finances? N  Housekeeping or managing your Housekeeping? N  Some recent data might be hidden    Patient Care Team: Michella Detjen, Nelda Bucks, NP as PCP - General (Family Medicine) Teena Irani, MD (Inactive) as Consulting Physician (Gastroenterology) Warden Fillers, MD as Consulting Physician  (Ophthalmology) [name], MD as Consulting Physician (Ophthalmology) Mickel Fuchs, MD as Referring Physician (Psychiatry) Velora Heckler (Orthotics)  Indicate any recent Medical Services you may have received from other than [facility] providers in the past year (date may be approximate).     Assessment:   This is a routine wellness examination for Tanya Bell.  Hearing/Vision screen  Hearing Screening   125Hz  250Hz  500Hz  1000Hz  2000Hz  3000Hz  4000Hz  6000Hz  8000Hz   Right ear:           Left ear:           Comments: No Hearing Concerns.   Vision Screening Comments: No Vision Concerns. Patient last eye exam was 09/2020  Dietary issues and exercise activities discussed: Current Exercise Habits: The patient does not participate in regular exercise at present, Exercise limited by: None identified  Goals    . Exercise 3x per week (30 min per time)     I would like to increase my exercise twice per week  I will also like to socialize more     . Weight (lb) < 200 lb (90.7 kg)     Starting 10/29/16, I will attempt to maintain my current weight or decrease when possible.       Depression Screen PHQ 2/9 Scores 01/12/2021 02/21/2020 01/04/2020 04/23/2019 01/02/2019 12/16/2017 12/14/2017  PHQ - 2 Score 0 0 0 0 0 0 1    Fall Risk Fall Risk  01/12/2021 12/31/2020 12/26/2020 07/03/2020 06/24/2020  Falls in the past year? 0 0 0 0 0  Number falls in past yr: 0 0 0 - 0  Injury with Fall? 0 0 0 - 0    FALL RISK PREVENTION PERTAINING TO THE HOME:  Any stairs in or around the home? Yes  If so, are there any without handrails? No  Home free of loose throw rugs in walkways, pet beds, electrical cords, etc? No  Adequate lighting in your home to reduce risk of falls? Yes   ASSISTIVE DEVICES UTILIZED TO PREVENT FALLS:  Life alert? No  Use of a cane, walker or w/c? No  Grab bars in the bathroom? Yes  Shower chair or bench in shower? No  Elevated toilet seat or a handicapped toilet? No   TIMED UP AND  GO:  Was the test performed? No .  Length of time to ambulate 10 feet: N/A sec.   Gait steady and fast without use of assistive device  Cognitive Function: MMSE - Mini Mental State Exam 01/04/2020 01/02/2019 12/14/2017 10/29/2016 09/04/2014  Orientation to time 4 5 5 5 5   Orientation to Place 5 5 5 5 5   Registration 3 3 3 3 3   Attention/ Calculation 5 5 5 5 3   Recall 3 3 1 2 1   Language- name 2 objects 2 2 2 2 2   Language- repeat 1 1 1 1 1   Language- follow 3 step command 3 3 3 2 2   Language- read & follow direction 1 1 1 1 1   Write a sentence 1 1 1 1 1   Copy design 1 1 1 1 1   Total score 29 30 28 28 25      6CIT Screen 01/12/2021  What Year? 0 points  What month? 0 points  What time? 0 points  Count back from 20 0 points  Months in reverse 0 points  Repeat phrase 6 points  Total Score 6    Immunizations Immunization History  Administered Date(s) Administered  . Fluad Quad(high Dose 65+) 08/29/2019, 09/02/2020  . Influenza Split 08/25/2010, 08/26/2011, 10/04/2012  . Influenza Whole 09/24/2009  . Influenza, High Dose Seasonal PF 08/16/2018  . Influenza,inj,Quad PF,6+ Mos 09/11/2013, 09/04/2014, 11/12/2015, 10/04/2016, 09/06/2017  . Influenza-Unspecified 09/02/2018  . PFIZER(Purple Top)SARS-COV-2 Vaccination 02/11/2020, 03/03/2020, 09/17/2020  . Pneumococcal Conjugate-13 02/17/2012  . Pneumococcal Polysaccharide-23 05/19/2016  . Tdap 02/17/2012    TDAP status: Up to date  Flu Vaccine status: Up to date  Pneumococcal vaccine status: Up to date  Covid-19 vaccine status: Completed vaccines  Qualifies for Shingles Vaccine? Yes   Zostavax completed No   Shingrix Completed?: No.    Education has been provided regarding the importance of this vaccine. Patient has been advised to call insurance company to determine out of pocket expense if they have not yet received this vaccine. Advised may also receive vaccine at local pharmacy or Health Dept. Verbalized acceptance and  understanding.  Screening Tests Health Maintenance  Topic Date Due  . Hepatitis C Screening  Never done  . FOOT EXAM  05/29/2021  . HEMOGLOBIN A1C  06/21/2021  . OPHTHALMOLOGY EXAM  07/02/2021  . TETANUS/TDAP  02/16/2022  . INFLUENZA VACCINE  Completed  . DEXA SCAN  Completed  . COVID-19 Vaccine  Completed  . PNA vac Low Risk Adult  Completed    Health Maintenance  Health Maintenance Due  Topic Date Due  . Hepatitis C Screening  Never done    Colorectal cancer screening: No longer required.   Mammogram status: Ordered decline today will call provider to scheduel when desired . Pt provided with contact info and advised to call to schedule appt.   Bone Density status: Completed 09/17/2020 . Results reflect: Bone density results: OSTEOPENIA. Repeat every 2 years.  Lung Cancer Screening: (Low Dose CT Chest recommended if Age 75-80 years, 30 pack-year currently smoking OR have quit w/in 15years.) does not qualify.   Lung Cancer Screening Referral: No   Additional Screening:  Hepatitis C Screening: does qualify; Completed   Vision Screening: Recommended annual ophthalmology exams for early detection of glaucoma and other disorders of the eye. Is the patient up to date with their annual eye exam?  Yes  Who is the provider or what is the name of the office in which the patient attends annual eye exams? Dr.Fekrat Ivin Booty  If pt is not established with a provider, would they like to be referred to a provider to establish care? No .   Dental Screening: Recommended annual dental exams for proper oral hygiene  Community Resource Referral / Chronic Care Management: CRR required this visit?  No   CCM required this visit?  No      Plan:    - Advised to get her shingles vaccine at her pharmacy   I have personally reviewed and noted the following in the patient's chart:   . Medical and social history . Use of alcohol, tobacco or illicit drugs  . Current medications and  supplements . Functional ability and status . Nutritional status . Physical activity . Advanced directives . List of other physicians . Hospitalizations, surgeries, and ER visits in previous 12 months . Vitals . Screenings to include cognitive, depression, and falls . Referrals and appointments  In addition, I have reviewed and discussed with patient certain preventive protocols, quality metrics, and best practice recommendations. A written personalized care plan for preventive services as well as general preventive health recommendations were provided to patient.    Sandrea Hughs, NP   01/12/2021   Nurse Notes: due for shingles vaccine.Advised to get her shingles vaccine at her pharmacy

## 2021-01-27 ENCOUNTER — Ambulatory Visit: Payer: Medicare Other | Admitting: Podiatry

## 2021-01-27 ENCOUNTER — Telehealth: Payer: Self-pay

## 2021-01-27 DIAGNOSIS — I10 Essential (primary) hypertension: Secondary | ICD-10-CM

## 2021-01-27 MED ORDER — AMLODIPINE BESYLATE 5 MG PO TABS: 5.0000 mg | ORAL_TABLET | Freq: Every day | ORAL | 1 refills | Status: DC

## 2021-01-27 MED ORDER — SITAGLIPTIN PHOSPHATE 25 MG PO TABS: 25.0000 mg | ORAL_TABLET | Freq: Every day | ORAL | 1 refills | Status: DC

## 2021-01-27 NOTE — Telephone Encounter (Signed)
Patient called in for refill and [facility] day supply.

## 2021-03-19 DIAGNOSIS — H35371 Puckering of macula, right eye: Secondary | ICD-10-CM | POA: Diagnosis not present

## 2021-03-19 DIAGNOSIS — H35372 Puckering of macula, left eye: Secondary | ICD-10-CM | POA: Diagnosis not present

## 2021-03-19 DIAGNOSIS — H3521 Other non-diabetic proliferative retinopathy, right eye: Secondary | ICD-10-CM | POA: Diagnosis not present

## 2021-03-19 DIAGNOSIS — Z8669 Personal history of other diseases of the nervous system and sense organs: Secondary | ICD-10-CM | POA: Diagnosis not present

## 2021-03-19 DIAGNOSIS — H35351 Cystoid macular degeneration, right eye: Secondary | ICD-10-CM | POA: Diagnosis not present

## 2021-03-19 DIAGNOSIS — D573 Sickle-cell trait: Secondary | ICD-10-CM | POA: Diagnosis not present

## 2021-03-19 DIAGNOSIS — E119 Type 2 diabetes mellitus without complications: Secondary | ICD-10-CM | POA: Diagnosis not present

## 2021-04-02 ENCOUNTER — Other Ambulatory Visit: Payer: Self-pay | Admitting: *Deleted

## 2021-04-02 DIAGNOSIS — J45909 Unspecified asthma, uncomplicated: Secondary | ICD-10-CM

## 2021-04-02 MED ORDER — BUDESONIDE-FORMOTEROL FUMARATE 160-4.5 MCG/ACT IN AERO: INHALATION_SPRAY | RESPIRATORY_TRACT | 3 refills | Status: DC

## 2021-04-02 NOTE — Telephone Encounter (Signed)
Patient requested refill to be sent to [facility].  

## 2021-04-20 ENCOUNTER — Other Ambulatory Visit: Payer: Self-pay | Admitting: Family

## 2021-04-20 ENCOUNTER — Telehealth: Payer: Self-pay | Admitting: *Deleted

## 2021-04-20 NOTE — Telephone Encounter (Signed)
Patient called and stated that she needs a refill on her Zetia 10mg . Stated that she has been taking it and almost out and needs a local supply and a Mail Order supply sent.  Medication is not in Current Medication list.  Is this ok to add and send.  Please Advise.

## 2021-04-20 NOTE — Telephone Encounter (Signed)
Okay to refill Zetia 10 mg tablet one by mouth daily

## 2021-04-21 MED ORDER — EZETIMIBE 10 MG PO TABS: 10.0000 mg | ORAL_TABLET | Freq: Every day | ORAL | 1 refills | Status: DC

## 2021-04-21 MED ORDER — EZETIMIBE 10 MG PO TABS: 10.0000 mg | ORAL_TABLET | Freq: Every day | ORAL | 0 refills | Status: DC

## 2021-04-21 NOTE — Telephone Encounter (Signed)
Patient notified. Medication list updated. Faxed Rx to Local pharmacy and Mail order.

## 2021-04-28 ENCOUNTER — Encounter: Payer: Self-pay | Admitting: Podiatry

## 2021-04-28 ENCOUNTER — Ambulatory Visit (INDEPENDENT_AMBULATORY_CARE_PROVIDER_SITE_OTHER): Payer: Medicare Other | Admitting: Podiatry

## 2021-04-28 ENCOUNTER — Other Ambulatory Visit: Payer: Self-pay

## 2021-04-28 DIAGNOSIS — B351 Tinea unguium: Secondary | ICD-10-CM | POA: Diagnosis not present

## 2021-04-28 DIAGNOSIS — M79675 Pain in left toe(s): Secondary | ICD-10-CM

## 2021-04-28 DIAGNOSIS — Q828 Other specified congenital malformations of skin: Secondary | ICD-10-CM

## 2021-04-28 DIAGNOSIS — M79674 Pain in right toe(s): Secondary | ICD-10-CM

## 2021-04-28 DIAGNOSIS — E119 Type 2 diabetes mellitus without complications: Secondary | ICD-10-CM

## 2021-04-29 ENCOUNTER — Other Ambulatory Visit: Payer: Self-pay | Admitting: Family

## 2021-04-29 DIAGNOSIS — I10 Essential (primary) hypertension: Secondary | ICD-10-CM

## 2021-05-03 NOTE — Progress Notes (Signed)
  Subjective:  Patient ID: Tanya Bell, female    DOB: 1941/03/17,  MRN: [id number]  80 y.o. female presents with preventative diabetic foot care and callus(es) b/l feet and painful thick toenails that are difficult to trim. Painful toenails interfere with ambulation. Aggravating factors include wearing enclosed shoe gear. Pain is relieved with periodic professional debridement. Painful calluses are aggravated when weightbearing with and without shoegear. Pain is relieved with periodic professional debridement..    Patient's blood sugar was 106 mg/dl today.  PCP: Sandrea Hughs, NP and last visit was: 12/31/2020 via video visit.  Review of Systems: Negative except as noted in the HPI.   Allergies  Allergen Reactions  . Crestor [Rosuvastatin Calcium]     Muscle cramps  . Latex Other (See Comments)    GLOVES  . Sulfa Antibiotics     Objective:  There were no vitals filed for this visit. Constitutional Patient is a pleasant 80 y.o. African American female WD, WN in NAD. AAO x 3.  Vascular Neurovascular status unchanged b/l lower extremities. Capillary refill time to digits immediate b/l. Palpable pedal pulses b/l LE. Pedal hair present. Lower extremity skin temperature gradient within normal limits. No pain with calf compression b/l. Varicosities present b/l. No cyanosis or clubbing noted.  Neurologic Normal speech. Protective sensation intact 5/5 intact bilaterally with 10g monofilament b/l. Vibratory sensation intact b/l.  Dermatologic Pedal skin with normal turgor, texture and tone bilaterally. No open wounds bilaterally. No interdigital macerations bilaterally. Toenails 1-5 b/l elongated, discolored, dystrophic, thickened, crumbly with subungual debris and tenderness to dorsal palpation. Porokeratotic lesion(s) submet head 1 left foot, submet head 1 right foot, submet head 5 left foot and submet head 5 right foot. No erythema, no edema, no drainage, no fluctuance.  Orthopedic:  Normal muscle strength 5/5 to all lower extremity muscle groups bilaterally. No pain crepitus or joint limitation noted with ROM b/l. Hallux valgus with bunion deformity noted b/l lower extremities. Hammertoe(s) noted to the L 5th toe and R 5th toe.   Hemoglobin A1C Latest Ref Rng & Units 12/22/2020 06/18/2020  HGBA1C <5.7 % of total Hgb 6.3(H) 6.3(H)  Some recent data might be hidden   Assessment:   1. Pain due to onychomycosis of toenails of both feet   2. Callus   3. Porokeratosis   4. Controlled type 2 diabetes mellitus without complication, without long-term current use of insulin ([place])    Plan:  Patient was evaluated and treated and all questions answered.  Onychomycosis with pain -Nails palliatively debridement as below. -Educated on self-care  Procedure: Nail Debridement Rationale: Pain Type of Debridement: manual, sharp debridement. Instrumentation: Nail nipper, rotary burr. Number of Nails: 10  -Examined patient. -Continue diabetic foot care principles. -Patient to continue soft, supportive shoe gear daily. -Toenails 1-5 b/l were debrided in length and girth with sterile nail nippers and dremel without iatrogenic bleeding.  -Painful porokeratotic lesion(s) submet head 1 left foot, submet head 1 right foot, submet head 5 left foot and submet head 5 right foot pared and enucleated with sterile scalpel blade without incident. Total number of lesions debrided=4. -Patient to report any pedal injuries to medical professional immediately. -Patient/POA to call should there be question/concern in the interim.  Return in about 3 months (around 07/29/2021).  Marzetta Board, DPM

## 2021-05-14 ENCOUNTER — Other Ambulatory Visit: Payer: Self-pay | Admitting: *Deleted

## 2021-05-14 MED ORDER — LOSARTAN POTASSIUM 100 MG PO TABS: 100.0000 mg | ORAL_TABLET | Freq: Every day | ORAL | 1 refills | Status: DC

## 2021-05-14 NOTE — Telephone Encounter (Signed)
Patient requested refill

## 2021-06-06 ENCOUNTER — Encounter (HOSPITAL_COMMUNITY): Payer: Self-pay

## 2021-06-06 ENCOUNTER — Ambulatory Visit (HOSPITAL_COMMUNITY)
Admission: EM | Admit: 2021-06-06 | Discharge: 2021-06-06 | Disposition: A | Discharge status: Home / Self Care | Payer: Medicare Other | Attending: Urgent Care | Admitting: Urgent Care

## 2021-06-06 DIAGNOSIS — R059 Cough, unspecified: Secondary | ICD-10-CM

## 2021-06-06 DIAGNOSIS — R110 Nausea: Secondary | ICD-10-CM

## 2021-06-06 DIAGNOSIS — R52 Pain, unspecified: Secondary | ICD-10-CM

## 2021-06-06 DIAGNOSIS — B349 Viral infection, unspecified: Secondary | ICD-10-CM

## 2021-06-06 LAB — POCT URINALYSIS DIPSTICK, ED / UC
Bilirubin Urine: NEGATIVE
Glucose, UA: NEGATIVE mg/dL
Hgb urine dipstick: NEGATIVE
Ketones, ur: NEGATIVE mg/dL
Leukocytes,Ua: NEGATIVE
Nitrite: NEGATIVE
Protein, ur: NEGATIVE mg/dL
Specific Gravity, Urine: 1.015 (ref 1.005–1.030)
Urobilinogen, UA: 0.2 mg/dL (ref 0.0–1.0)
pH: 5.5 (ref 5.0–8.0)

## 2021-06-06 LAB — POC INFLUENZA A AND B ANTIGEN (URGENT CARE ONLY)
INFLUENZA A ANTIGEN, POC: NEGATIVE
INFLUENZA B ANTIGEN, POC: NEGATIVE

## 2021-06-06 MED ORDER — ONDANSETRON 4 MG PO TBDP: 4.0000 mg | ORAL_TABLET | Freq: Three times a day (TID) | ORAL | 0 refills | Status: DC | PRN

## 2021-06-06 MED ORDER — BENZONATATE 100 MG PO CAPS: 100.0000 mg | ORAL_CAPSULE | Freq: Three times a day (TID) | ORAL | 0 refills | Status: DC | PRN

## 2021-06-06 NOTE — ED Triage Notes (Signed)
Patient presents to Urgent Care with complaints of nausea, generalized body aches, urinary freq x 5 days ago. Treating pain with tylenol, increasing fluid intake.   Denies fever, abdominal pain, hematuria, or vomiting.

## 2021-06-06 NOTE — ED Provider Notes (Addendum)
[place]   MRN: [id number] DOB: 13-Feb-1941  Subjective:   Tanya Bell is a 80 y.o. female presenting for 5-day history of persistent nausea without vomiting, body aches, malaise, occasional cough.  Has started having urinary frequency.  She is a type II diabetic and reports that her blood sugars have been less than 120s.  She is also taking a cholesterol medication, rosuvastatin, supposed to take this thrice weekly.  No current facility-administered medications for this encounter.  Current Outpatient Medications:    acetaminophen (TYLENOL) 325 MG tablet, Take 325 mg by mouth daily as needed. For pain, Disp: , Rfl:    albuterol (VENTOLIN HFA) 108 (90 Base) MCG/ACT inhaler, Inhale 2 puffs into the lungs every 6 (six) hours as needed for wheezing or shortness of breath., Disp: 18 g, Rfl: 1   amLODipine (NORVASC) 5 MG tablet, Take 1 tablet (5 mg total) by mouth daily., Disp: 90 tablet, Rfl: 1   budesonide-formoterol (SYMBICORT) 160-4.5 MCG/ACT inhaler, Inhale 2 puffs into the lungs 2 (two) times daily. PLEASE GIVE PATIENT 3 MONTH SUPPLY., Disp: , Rfl:    calcium-vitamin D (OSCAL WITH D) 500-200 MG-UNIT per tablet, Take 1 tablet by mouth 2 (two) times daily., Disp: 60 tablet, Rfl: 0   carvedilol (COREG) 12.5 MG tablet, TAKE 1 TABLET ONCE DAILY FOR BLOOD PRESSURE, Disp: 90 tablet, Rfl: 3   ELDERBERRY PO, Take by mouth 3 (three) times a week. , Disp: , Rfl:    ezetimibe (ZETIA) 10 MG tablet, Take 1 tablet (10 mg total) by mouth daily., Disp: 90 tablet, Rfl: 1   fexofenadine (ALLEGRA) 180 MG tablet, Take 180 mg by mouth as needed. , Disp: , Rfl:    ketorolac (ACULAR) 0.5 % ophthalmic solution, Place 1 drop into the right eye 4 (four) times daily., Disp: , Rfl:    KETOROLAC TROMETHAMINE OP, Apply 1 drop to eye 4 (four) times daily. Right eye, Disp: , Rfl:    losartan (COZAAR) 100 MG tablet, Take 1 tablet (100 mg total) by mouth daily., Disp: 90 tablet, Rfl: 1   Magnesium  Oxide 500 MG CAPS, Take 2 capsules by mouth at bedtime., Disp: , Rfl:    metFORMIN (GLUCOPHAGE) 500 MG tablet, Take by mouth., Disp: , Rfl:    mometasone (NASONEX) 50 MCG/ACT nasal spray, Place 1 spray into the nose 2 (two) times daily as needed., Disp: 51 g, Rfl: 3   Multiple Vitamins-Minerals (ZINC PO), Take 1 tablet by mouth once a week., Disp: , Rfl:    prednisoLONE acetate (PRED FORTE) 1 % ophthalmic suspension, Place 1 drop into the right eye 4 (four) times daily. , Disp: , Rfl:    psyllium (REGULOID) 0.52 g capsule, Take 0.52 g by mouth daily., Disp: , Rfl:    rosuvastatin (CRESTOR) 20 MG tablet, Take one tablet three times a week, Monday, Wednesday and Friday, Disp: 36 tablet, Rfl: 3   simethicone (GAS-X) 80 MG chewable tablet, Chew 1 tablet (80 mg total) by mouth every 6 (six) hours as needed for flatulence., Disp: 30 tablet, Rfl: 0   sitaGLIPtin (JANUVIA) 25 MG tablet, Take 1 tablet (25 mg total) by mouth daily., Disp: 90 tablet, Rfl: 1   sodium chloride (OCEAN) 0.65 % SOLN nasal spray, Place 1 spray into both nostrils as needed for congestion., Disp: 30 mL, Rfl: 0   Allergies  Allergen Reactions   Crestor [Rosuvastatin Calcium]     Muscle cramps   Latex Other (See Comments)  GLOVES   Sulfa Antibiotics     Past Medical History:  Diagnosis Date   Acute upper respiratory infections of unspecified site    Acute upper respiratory infections of unspecified site    Acute upper respiratory infections of unspecified site    Allergic rhinitis due to pollen    Candidiasis of mouth    Disorder of bone and cartilage, unspecified    Diverticulitis of colon (without mention of hemorrhage)(562.11)    Essential hypertension, benign    Essential hypertension, benign    Extrinsic asthma, unspecified    Herpes simplex with other ophthalmic complications    Other abnormal glucose    Other and unspecified hyperlipidemia    Other and unspecified hyperlipidemia    Other and unspecified  hyperlipidemia    Other cataract    Other cataract    Other dystrophy of vulva    Other malaise and fatigue    Rash and other nonspecific skin eruption    Reflux esophagitis    Thrombocytopenia, unspecified (HCC)    Type II or unspecified type diabetes mellitus without mention of complication, not stated as uncontrolled    Type II or unspecified type diabetes mellitus without mention of complication, not stated as uncontrolled    Unspecified disorder of skin and subcutaneous tissue    Unspecified essential hypertension    Unspecified essential hypertension      Past Surgical History:  Procedure Laterality Date   ABDOMINAL HYSTERECTOMY     CESAREAN SECTION     1960,1964,1971,1972   CYST EXCISION  1974   Pilonidal Cyst excision   EYE SURGERY Right 04/14/2017   Dr. Zadie Rhine   KNEE ARTHROSCOPY Right    for meniscal tear   RETINAL TEAR REPAIR CRYOTHERAPY  10/21/2017   Getting injections in right eye    Family History  Problem Relation Age of Onset   Cancer Mother        brain   Stroke Father     Social History   Tobacco Use   Smoking status: Former    Pack years: 0.00    Types: Cigarettes    Quit date: 12/13/1990    Years since quitting: 30.5   Smokeless tobacco: Never  Vaping Use   Vaping Use: Never used  Substance Use Topics   Alcohol use: No    Alcohol/week: 0.0 standard drinks   Drug use: No    ROS   Objective:   Vitals: BP (S) (!) 145/52 (BP Location: Right Arm)   Pulse 73   Temp 99.3 F (37.4 C) (Oral)   Resp 16   SpO2 97%   Physical Exam Constitutional:      General: She is not in acute distress.    Appearance: Normal appearance. She is well-developed and normal weight. She is not ill-appearing, toxic-appearing or diaphoretic.  HENT:     Head: Normocephalic and atraumatic.     Right Ear: Tympanic membrane, ear canal and external ear normal. No drainage or tenderness. No middle ear effusion. Tympanic membrane is not erythematous.     Left Ear:  Tympanic membrane, ear canal and external ear normal. No drainage or tenderness.  No middle ear effusion. Tympanic membrane is not erythematous.     Nose: Nose normal. No congestion or rhinorrhea.     Mouth/Throat:     Mouth: Mucous membranes are moist. No oral lesions.     Pharynx: No pharyngeal swelling, oropharyngeal exudate, posterior oropharyngeal erythema or uvula swelling.     Tonsils: No tonsillar  exudate or tonsillar abscesses.  Eyes:     General: No scleral icterus.    Extraocular Movements: Extraocular movements intact.     Right eye: Normal extraocular motion.     Left eye: Normal extraocular motion.     Conjunctiva/sclera: Conjunctivae normal.     Pupils: Pupils are equal, round, and reactive to light.  Cardiovascular:     Rate and Rhythm: Normal rate and regular rhythm.     Pulses: Normal pulses.     Heart sounds: Normal heart sounds. No murmur heard.   No friction rub. No gallop.  Pulmonary:     Effort: Pulmonary effort is normal. No respiratory distress.     Breath sounds: Normal breath sounds. No stridor. No wheezing, rhonchi or rales.  Abdominal:     General: Bowel sounds are normal. There is no distension.     Palpations: Abdomen is soft. There is no mass.     Tenderness: There is no abdominal tenderness. There is no right CVA tenderness, left CVA tenderness, guarding or rebound.  Musculoskeletal:     Cervical back: Normal range of motion and neck supple.  Lymphadenopathy:     Cervical: No cervical adenopathy.  Skin:    General: Skin is warm and dry.     Coloration: Skin is not pale.     Findings: No rash.  Neurological:     General: No focal deficit present.     Mental Status: She is alert and oriented to person, place, and time.  Psychiatric:        Mood and Affect: Mood normal.        Behavior: Behavior normal.        Thought Content: Thought content normal.        Judgment: Judgment normal.    Results for orders placed or performed during the hospital  encounter of 06/06/21 (from the past 24 hour(s))  POC Urinalysis dipstick     Status: None   Collection Time: 06/06/21  3:00 PM  Result Value Ref Range   Glucose, UA NEGATIVE NEGATIVE mg/dL   Bilirubin Urine NEGATIVE NEGATIVE   Ketones, ur NEGATIVE NEGATIVE mg/dL   Specific Gravity, Urine 1.015 1.005 - 1.030   Hgb urine dipstick NEGATIVE NEGATIVE   pH 5.5 5.0 - 8.0   Protein, ur NEGATIVE NEGATIVE mg/dL   Urobilinogen, UA 0.2 0.0 - 1.0 mg/dL   Nitrite NEGATIVE NEGATIVE   Leukocytes,Ua NEGATIVE NEGATIVE  POC Influenza A & B Ag (Urgent Care)     Status: None   Collection Time: 06/06/21  3:47 PM  Result Value Ref Range   INFLUENZA A ANTIGEN, POC NEGATIVE NEGATIVE   INFLUENZA B ANTIGEN, POC NEGATIVE NEGATIVE    Assessment and Plan :   PDMP not reviewed this encounter.  1. Viral illness   2. Nausea   3. Cough   4. Body aches     Patient refused COVID-19 testing.  Recommended supportive care.  Also advised that she stop rosuvastatin due to the possibility of rhabdomyolysis.  Vital signs and physical exam findings stable for outpatient management.  She did have a negative influenza test, urinalysis was unremarkable.  Prescription sent for supportive care to her pharmacy.  Deferred imaging due to clear cardiopulmonary exam. Follow-up with PCP ASAP. Counseled patient on potential for adverse effects with medications prescribed/recommended today, ER and return-to-clinic precautions discussed, patient verbalized understanding.     Jaynee Eagles, [place] 06/06/21 [phone]

## 2021-06-06 NOTE — ED Notes (Signed)
Declines COVID test at this time.

## 2021-06-06 NOTE — Discharge Instructions (Addendum)
We will manage this as a viral syndrome. For sore throat or cough try using a honey-based tea. Use 3 teaspoons of honey with juice squeezed from half lemon. Place shaved pieces of ginger into 1/2-1 cup of water and warm over stove top. Then mix the ingredients and repeat every 4 hours as needed. Please take Tylenol 500mg -650mg  once every 6 hours for fevers, aches and pains. Hydrate very well with at least 2 liters (64 ounces) of water. Eat light meals such as soups (chicken and noodles, chicken wild rice, vegetable).  Do not eat any foods that you are allergic to.  Start an antihistamine like Zyrtec, Allegra or Claritin for postnasal drainage, sinus congestion.

## 2021-06-08 ENCOUNTER — Ambulatory Visit (INDEPENDENT_AMBULATORY_CARE_PROVIDER_SITE_OTHER): Payer: Medicare Other | Admitting: Family

## 2021-06-08 ENCOUNTER — Other Ambulatory Visit: Payer: Self-pay

## 2021-06-08 ENCOUNTER — Encounter: Payer: Self-pay | Admitting: Family

## 2021-06-08 VITALS — BP 140/80 | HR 83 | Temp 97.5°F | Resp 16 | Ht 61.0 in | Wt 163.8 lb

## 2021-06-08 DIAGNOSIS — M6289 Other specified disorders of muscle: Secondary | ICD-10-CM

## 2021-06-08 DIAGNOSIS — I10 Essential (primary) hypertension: Secondary | ICD-10-CM

## 2021-06-08 DIAGNOSIS — R110 Nausea: Secondary | ICD-10-CM | POA: Diagnosis not present

## 2021-06-08 LAB — COMPLETE METABOLIC PANEL WITH GFR
AG Ratio: 1.3 (calc) (ref 1.0–2.5)
ALT: 15 U/L (ref 6–29)
AST: 15 U/L (ref 10–35)
Albumin: 4.2 g/dL (ref 3.6–5.1)
Alkaline phosphatase (APISO): 71 U/L (ref 37–153)
BUN: 13 mg/dL (ref 7–25)
CO2: 26 mmol/L (ref 20–32)
Calcium: 9.5 mg/dL (ref 8.6–10.4)
Chloride: 105 mmol/L (ref 98–110)
Creat: 0.62 mg/dL (ref 0.60–0.93)
GFR, Est African American: 99 mL/min/{1.73_m2} (ref >=60)
GFR, Est Non African American: 86 mL/min/{1.73_m2} (ref >=60)
Globulin: 3.2 g/dL (calc) (ref 1.9–3.7)
Glucose, Bld: 65 mg/dL (ref 65–139)
Potassium: 3.9 mmol/L (ref 3.5–5.3)
Sodium: 140 mmol/L (ref 135–146)
Total Bilirubin: 0.4 mg/dL (ref 0.2–1.2)
Total Protein: 7.4 g/dL (ref 6.1–8.1)

## 2021-06-08 LAB — CBC WITH DIFFERENTIAL/PLATELET
Absolute Monocytes: 784 cells/uL (ref 200–950)
Basophils Absolute: 40 cells/uL (ref 0–200)
Basophils Relative: 0.6 %
Eosinophils Absolute: 168 cells/uL (ref 15–500)
Eosinophils Relative: 2.5 %
HCT: 41.8 % (ref 35.0–45.0)
Hemoglobin: 13.9 g/dL (ref 11.7–15.5)
Lymphs Abs: 2794 cells/uL (ref 850–3900)
MCH: 26 pg — ABNORMAL LOW (ref 27.0–33.0)
MCHC: 33.3 g/dL (ref 32.0–36.0)
MCV: 78.3 fL — ABNORMAL LOW (ref 80.0–100.0)
Monocytes Relative: 11.7 %
Neutro Abs: 2915 cells/uL (ref 1500–7800)
Neutrophils Relative %: 43.5 %
Platelets: 131 10*3/uL — ABNORMAL LOW (ref 140–400)
RBC: 5.34 10*6/uL — ABNORMAL HIGH (ref 3.80–5.10)
RDW: 16.3 % — ABNORMAL HIGH (ref 11.0–15.0)
Total Lymphocyte: 41.7 %
WBC: 6.7 10*3/uL (ref 3.8–10.8)

## 2021-06-08 MED ORDER — CARVEDILOL 12.5 MG PO TABS: ORAL_TABLET | ORAL | 3 refills | Status: DC

## 2021-06-08 MED ORDER — METHOCARBAMOL 500 MG PO TABS: 500.0000 mg | ORAL_TABLET | Freq: Three times a day (TID) | ORAL | 0 refills | Status: DC | PRN

## 2021-06-08 NOTE — Progress Notes (Signed)
Provider: Marlowe Sax FNP-C  Donise Woodle, Nelda Bucks, NP  Patient Care Team: Lexy Meininger, Nelda Bucks, NP as PCP - General (Family Medicine) Teena Irani, MD (Inactive) as Consulting Physician (Gastroenterology) Warden Fillers, MD as Consulting Physician (Ophthalmology) [name], MD as Consulting Physician (Ophthalmology) Mickel Fuchs, MD as Referring Physician (Psychiatry) Velora Heckler (Orthotics)  Extended Emergency Contact Information Primary Emergency Contact: Juarez Phone: [phone] Relation: None  Code Status:  full Code  Goals of care: Advanced Directive information Advanced Directives 06/08/2021  Does Patient Have a Medical Advance Directive? No  Type of Advance Directive -  Does patient want to make changes to medical advance directive? -  Would patient like information on creating a medical advance directive? No - Patient declined     Chief Complaint  Patient presents with   Acute Visit    Complains of muscle pain that's bothering her walking.     HPI:  Pt is a 80 y.o. female seen today for an acute visit for evaluation of muscle pain bothering her to walk.Has had generalized muscle pain.she describes pain as tightness on neck,shoulders,arms and thighs.she thinks muscle tightness is from her medication Coreg.states receives medication from mail pharmacy which usually varies in color.states only needs medication ordered via [facility].she was seen in the urgent care on 06/06/2021 for cough,nausea and muscle aches was Dx with viral illness.she was also advised to stop her rosuvastatin.she declined COVID-19.Influenza test was negative. Zofran was ordered for nausea and Tessalon for cough. States was afraid of taking new medication so she brought them to visit today. States cough has improved. Has taken Tylenol but does not know whether it work or not. Urine was negative.  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 She denies any fever,chills or URI's. She request 30 days coreg to be refilled through her local pharmacy until her mail order arrives.states will not take any of her current tablet beliefs it caused her muscle aches/tightness.   Past Medical History:  Diagnosis Date   Acute upper respiratory infections of unspecified site    Acute upper respiratory infections of unspecified site    Acute upper respiratory infections of unspecified site    Allergic rhinitis due to pollen    Candidiasis of mouth    Disorder of bone and cartilage, unspecified    Diverticulitis of colon (without mention of hemorrhage)(562.11)    Essential hypertension, benign    Essential hypertension, benign    Extrinsic asthma, unspecified    Herpes simplex with other ophthalmic complications    Other abnormal glucose    Other and unspecified hyperlipidemia    Other and unspecified hyperlipidemia    Other and unspecified hyperlipidemia    Other cataract    Other cataract    Other dystrophy of vulva    Other malaise and fatigue    Rash and other nonspecific skin eruption    Reflux esophagitis    Thrombocytopenia, unspecified (HCC)    Type II or unspecified type diabetes mellitus without mention of complication, not stated as uncontrolled    Type II or unspecified type diabetes mellitus without mention of complication, not stated as uncontrolled    Unspecified disorder of skin and subcutaneous tissue    Unspecified essential hypertension    Unspecified essential hypertension    Past Surgical History:  Procedure Laterality Date   ABDOMINAL HYSTERECTOMY  CESAREAN SECTION     [phone]   CYST EXCISION  1974   Pilonidal Cyst excision   EYE SURGERY Right 04/14/2017   Dr. Zadie Rhine   KNEE ARTHROSCOPY Right    for meniscal tear   RETINAL TEAR REPAIR CRYOTHERAPY  10/21/2017   Getting injections in right eye    Allergies  Allergen Reactions   Crestor  [Rosuvastatin Calcium]     Muscle cramps   Latex Other (See Comments)    GLOVES   Sulfa Antibiotics     Outpatient Encounter Medications as of 06/08/2021  Medication Sig   acetaminophen (TYLENOL) 325 MG tablet Take 325 mg by mouth daily as needed. For pain   albuterol (VENTOLIN HFA) 108 (90 Base) MCG/ACT inhaler Inhale 2 puffs into the lungs every 6 (six) hours as needed for wheezing or shortness of breath.   amLODipine (NORVASC) 5 MG tablet Take 1 tablet (5 mg total) by mouth daily.   benzonatate (TESSALON) 100 MG capsule Take 1-2 capsules (100-200 mg total) by mouth 3 (three) times daily as needed for cough.   budesonide-formoterol (SYMBICORT) 160-4.5 MCG/ACT inhaler Inhale 2 puffs into the lungs 2 (two) times daily. PLEASE GIVE PATIENT 3 MONTH SUPPLY.   calcium-vitamin D (OSCAL WITH D) 500-200 MG-UNIT per tablet Take 1 tablet by mouth 2 (two) times daily.   carvedilol (COREG) 12.5 MG tablet TAKE 1 TABLET ONCE DAILY FOR BLOOD PRESSURE   ELDERBERRY PO Take by mouth 3 (three) times a week.    ezetimibe (ZETIA) 10 MG tablet Take 1 tablet (10 mg total) by mouth daily.   fexofenadine (ALLEGRA) 180 MG tablet Take 180 mg by mouth as needed.    ketorolac (ACULAR) 0.5 % ophthalmic solution Place 1 drop into the right eye 4 (four) times daily.   KETOROLAC TROMETHAMINE OP Apply 1 drop to eye 4 (four) times daily. Right eye   losartan (COZAAR) 100 MG tablet Take 1 tablet (100 mg total) by mouth daily.   Magnesium Oxide 500 MG CAPS Take 2 capsules by mouth at bedtime.   metFORMIN (GLUCOPHAGE) 500 MG tablet Take by mouth.   mometasone (NASONEX) 50 MCG/ACT nasal spray Place 1 spray into the nose 2 (two) times daily as needed.   Multiple Vitamins-Minerals (ZINC PO) Take 1 tablet by mouth once a week.   ondansetron (ZOFRAN-ODT) 4 MG disintegrating tablet Take 1 tablet (4 mg total) by mouth every 8 (eight) hours as needed for nausea or vomiting.   prednisoLONE acetate (PRED FORTE) 1 % ophthalmic suspension  Place 1 drop into the right eye 4 (four) times daily.    psyllium (REGULOID) 0.52 g capsule Take 0.52 g by mouth daily.   rosuvastatin (CRESTOR) 20 MG tablet Take one tablet three times a week, Monday, Wednesday and Friday   simethicone (GAS-X) 80 MG chewable tablet Chew 1 tablet (80 mg total) by mouth every 6 (six) hours as needed for flatulence.   sitaGLIPtin (JANUVIA) 25 MG tablet Take 1 tablet (25 mg total) by mouth daily.   sodium chloride (OCEAN) 0.65 % SOLN nasal spray Place 1 spray into both nostrils as needed for congestion.   No facility-administered encounter medications on file as of 06/08/2021.    Review of Systems  Constitutional:  Negative for appetite change, chills, fatigue, fever and unexpected weight change.  HENT:  Negative for congestion, dental problem, ear discharge, ear pain, facial swelling, hearing loss, nosebleeds, postnasal drip, rhinorrhea, sinus pressure, sinus pain, sneezing, sore throat, tinnitus and trouble swallowing.   Eyes:  Negative for pain, discharge, redness, itching and visual disturbance.  Respiratory:  Negative for chest tightness, shortness of breath and wheezing.        Cough resolved   Cardiovascular:  Negative for chest pain, palpitations and leg swelling.  Gastrointestinal:  Negative for abdominal distention, abdominal pain, blood in stool, constipation, diarrhea and vomiting.       Nausea sometimes   Endocrine: Negative for cold intolerance, heat intolerance, polydipsia, polyphagia and polyuria.  Genitourinary:  Negative for difficulty urinating, dysuria, flank pain, frequency, hematuria and urgency.  Musculoskeletal:  Positive for myalgias. Negative for arthralgias, back pain, gait problem, joint swelling, neck pain and neck stiffness.       Muscle tightness on shoulders/neck   Skin:  Negative for color change, pallor, rash and wound.  Neurological:  Negative for dizziness, syncope, speech difficulty, weakness, light-headedness, numbness and  headaches.  Hematological:  Does not bruise/bleed easily.  Psychiatric/Behavioral:  Negative for agitation, behavioral problems, confusion, hallucinations, self-injury, sleep disturbance and suicidal ideas. The patient is not nervous/anxious.    Immunization History  Administered Date(s) Administered   Fluad Quad(high Dose 65+) 08/29/2019, 09/02/2020   Influenza Split 08/25/2010, 08/26/2011, 10/04/2012   Influenza Whole 09/24/2009   Influenza, High Dose Seasonal PF 08/16/2018   Influenza,inj,Quad PF,6+ Mos 09/11/2013, 09/04/2014, 11/12/2015, 10/04/2016, 09/06/2017   Influenza-Unspecified 09/02/2018   PFIZER(Purple Top)SARS-COV-2 Vaccination 02/11/2020, 03/03/2020, 09/17/2020   Pneumococcal Conjugate-13 02/17/2012   Pneumococcal Polysaccharide-23 05/19/2016   Tdap 02/17/2012   Pertinent  Health Maintenance Due  Topic Date Due   FOOT EXAM  05/29/2021   HEMOGLOBIN A1C  06/21/2021   OPHTHALMOLOGY EXAM  07/02/2021   INFLUENZA VACCINE  07/13/2021   DEXA SCAN  Completed   PNA vac Low Risk Adult  Completed   Fall Risk  06/08/2021 01/12/2021 12/31/2020 12/26/2020 07/03/2020  Falls in the past year? 0 0 0 0 0  Number falls in past yr: 0 0 0 0 -  Injury with Fall? 0 0 0 0 -  Risk for fall due to : No Fall Risks - - - -  Follow up Falls evaluation completed - - - -   Functional Status Survey:    Vitals:   06/08/21 1311  BP: 140/80  Pulse: 83  Resp: 16  Temp: (!) 97.5 F (36.4 C)  SpO2: 97%  Weight: 163 lb 12.8 oz (74.3 kg)  Height: 5' 1"  (1.549 m)   Body mass index is 30.95 kg/m. Physical Exam Vitals reviewed.  Constitutional:      General: She is not in acute distress.    Appearance: Normal appearance. She is normal weight. She is not ill-appearing or diaphoretic.  HENT:     Head: Normocephalic.     Nose: Nose normal. No congestion or rhinorrhea.     Mouth/Throat:     Mouth: Mucous membranes are moist.     Pharynx: Oropharynx is clear. No oropharyngeal exudate or posterior  oropharyngeal erythema.  Eyes:     General: No scleral icterus.       Right eye: No discharge.        Left eye: No discharge.     Extraocular Movements: Extraocular movements intact.     Conjunctiva/sclera: Conjunctivae normal.     Pupils: Pupils are equal, round, and reactive to light.  Neck:     Vascular: No carotid bruit.  Cardiovascular:     Rate and Rhythm: Normal rate and regular rhythm.     Pulses: Normal pulses.     Heart sounds:  Normal heart sounds. No murmur heard.   No friction rub. No gallop.  Pulmonary:     Effort: Pulmonary effort is normal. No respiratory distress.     Breath sounds: Normal breath sounds. No wheezing, rhonchi or rales.  Chest:     Chest wall: No tenderness.  Abdominal:     General: Bowel sounds are normal. There is no distension.     Palpations: Abdomen is soft. There is no mass.     Tenderness: There is no abdominal tenderness. There is no right CVA tenderness, left CVA tenderness, guarding or rebound.  Musculoskeletal:        General: No swelling or tenderness. Normal range of motion.     Cervical back: Normal range of motion. No rigidity or tenderness.     Right lower leg: No edema.     Left lower leg: No edema.  Lymphadenopathy:     Cervical: No cervical adenopathy.  Skin:    General: Skin is warm and dry.     Coloration: Skin is not pale.     Findings: No bruising, erythema, lesion or rash.  Neurological:     Mental Status: She is alert and oriented to person, place, and time.     Cranial Nerves: No cranial nerve deficit.     Sensory: No sensory deficit.     Motor: No weakness.     Coordination: Coordination normal.     Gait: Gait normal.  Psychiatric:        Mood and Affect: Mood normal.        Speech: Speech normal.        Behavior: Behavior normal.        Thought Content: Thought content normal.        Judgment: Judgment normal.    Labs reviewed: Recent Labs    06/18/20 0816 12/22/20 0828  NA 140 144  K 4.1 4.5  CL 105  107  CO2 27 28  GLUCOSE 114* 125*  BUN 14 18  CREATININE 0.68 0.77  CALCIUM 9.6 9.9   Recent Labs    06/18/20 0816 12/22/20 0828  AST 14 15  ALT 17 14  BILITOT 0.5 0.6  PROT 7.2 7.2   Recent Labs    06/18/20 0816 12/22/20 0828  WBC 6.2 6.7  NEUTROABS 2,288 2,747  HGB 13.3 13.9  HCT 40.1 41.8  MCV 78.0* 78.7*  PLT 152 181   Lab Results  Component Value Date   TSH 1.75 12/22/2020   Lab Results  Component Value Date   HGBA1C 6.3 (H) 12/22/2020   Lab Results  Component Value Date   CHOL 133 12/22/2020   HDL 55 12/22/2020   LDLCALC 65 12/22/2020   TRIG 47 12/22/2020   CHOLHDL 2.4 12/22/2020    Significant Diagnostic Results in last 30 days:  No results found.  Assessment/Plan 1. Essential hypertension B/p well controlled. - carvedilol (COREG) 12.5 MG tablet; TAKE 1 TABLET ONCE DAILY FOR BLOOD PRESSURE  Dispense: 30 tablet; Refill: 3 - CBC with Differential/Platelet - CMP with eGFR([facility])  2. Muscle tightness Request muscle relaxant for neck/shoulder  Will prescribe short course of robaxin.will rule out other acute and metabolic etiologies. - methocarbamol (ROBAXIN) 500 MG tablet; Take 1 tablet (500 mg total) by mouth every 8 (eight) hours as needed for muscle spasms.  Dispense: 30 tablet; Refill: 0 - CBC with Differential/Platelet - CMP with eGFR([facility])  3. Nausea Has improved.has Zofran prescribed from urgent care but wanted to verify with provider prior  to use.advised to take 30 minutes prior to meals.   Family/ staff Communication: Reviewed plan of care with patient verbalized understanding   Labs/tests ordered:  - CBC with Differential/Platelet - CMP with eGFR([facility])  Next Appointment: Has appointment 06/26/2021   Sandrea Hughs, NP

## 2021-06-24 ENCOUNTER — Other Ambulatory Visit: Payer: Medicare Other

## 2021-06-24 ENCOUNTER — Other Ambulatory Visit: Payer: Self-pay

## 2021-06-24 DIAGNOSIS — Z1159 Encounter for screening for other viral diseases: Secondary | ICD-10-CM | POA: Diagnosis not present

## 2021-06-24 DIAGNOSIS — E1142 Type 2 diabetes mellitus with diabetic polyneuropathy: Secondary | ICD-10-CM

## 2021-06-24 DIAGNOSIS — E785 Hyperlipidemia, unspecified: Secondary | ICD-10-CM | POA: Diagnosis not present

## 2021-06-24 DIAGNOSIS — I10 Essential (primary) hypertension: Secondary | ICD-10-CM | POA: Diagnosis not present

## 2021-06-25 LAB — TSH: TSH: 2.05 mIU/L (ref 0.40–4.50)

## 2021-06-25 LAB — COMPLETE METABOLIC PANEL WITH GFR
AG Ratio: 1.4 (calc) (ref 1.0–2.5)
ALT: 12 U/L (ref 6–29)
AST: 12 U/L (ref 10–35)
Albumin: 4.2 g/dL (ref 3.6–5.1)
Alkaline phosphatase (APISO): 73 U/L (ref 37–153)
BUN: 15 mg/dL (ref 7–25)
CO2: 24 mmol/L (ref 20–32)
Calcium: 9.2 mg/dL (ref 8.6–10.4)
Chloride: 106 mmol/L (ref 98–110)
Creat: 0.69 mg/dL (ref 0.60–1.00)
Globulin: 3 g/dL (calc) (ref 1.9–3.7)
Glucose, Bld: 119 mg/dL — ABNORMAL HIGH (ref 65–99)
Potassium: 3.9 mmol/L (ref 3.5–5.3)
Sodium: 140 mmol/L (ref 135–146)
Total Bilirubin: 0.6 mg/dL (ref 0.2–1.2)
Total Protein: 7.2 g/dL (ref 6.1–8.1)
eGFR: 88 mL/min/{1.73_m2} (ref >=60)

## 2021-06-25 LAB — CBC WITH DIFFERENTIAL/PLATELET
Absolute Monocytes: 610 cells/uL (ref 200–950)
Basophils Absolute: 40 cells/uL (ref 0–200)
Basophils Relative: 0.6 %
Eosinophils Absolute: 168 cells/uL (ref 15–500)
Eosinophils Relative: 2.5 %
HCT: 42.2 % (ref 35.0–45.0)
Hemoglobin: 13.7 g/dL (ref 11.7–15.5)
Lymphs Abs: 3015 cells/uL (ref 850–3900)
MCH: 25.6 pg — ABNORMAL LOW (ref 27.0–33.0)
MCHC: 32.5 g/dL (ref 32.0–36.0)
MCV: 78.7 fL — ABNORMAL LOW (ref 80.0–100.0)
MPV: 11.8 fL (ref 7.5–12.5)
Monocytes Relative: 9.1 %
Neutro Abs: 2868 cells/uL (ref 1500–7800)
Neutrophils Relative %: 42.8 %
Platelets: 167 10*3/uL (ref 140–400)
RBC: 5.36 10*6/uL — ABNORMAL HIGH (ref 3.80–5.10)
RDW: 16 % — ABNORMAL HIGH (ref 11.0–15.0)
Total Lymphocyte: 45 %
WBC: 6.7 10*3/uL (ref 3.8–10.8)

## 2021-06-25 LAB — LIPID PANEL
Cholesterol: 153 mg/dL (ref <200)
HDL: 57 mg/dL (ref >=50)
LDL Cholesterol (Calc): 83 mg/dL (calc)
Non-HDL Cholesterol (Calc): 96 mg/dL (calc) (ref <130)
Total CHOL/HDL Ratio: 2.7 (calc) (ref <5.0)
Triglycerides: 54 mg/dL (ref <150)

## 2021-06-25 LAB — HEMOGLOBIN A1C
Hgb A1c MFr Bld: 6.2 % of total Hgb — ABNORMAL HIGH (ref <5.7)
Mean Plasma Glucose: 131 mg/dL
eAG (mmol/L): 7.3 mmol/L

## 2021-06-25 LAB — HEPATITIS C ANTIBODY
Hepatitis C Ab: NONREACTIVE
SIGNAL TO CUT-OFF: 0.07 (ref <1.00)

## 2021-06-26 ENCOUNTER — Encounter: Payer: Self-pay | Admitting: Family

## 2021-06-26 ENCOUNTER — Ambulatory Visit (INDEPENDENT_AMBULATORY_CARE_PROVIDER_SITE_OTHER): Payer: Medicare Other | Admitting: Family

## 2021-06-26 ENCOUNTER — Other Ambulatory Visit: Payer: Self-pay

## 2021-06-26 VITALS — BP 140/88 | HR 67 | Temp 97.5°F | Resp 16 | Ht 61.0 in | Wt 160.4 lb

## 2021-06-26 DIAGNOSIS — J301 Allergic rhinitis due to pollen: Secondary | ICD-10-CM

## 2021-06-26 DIAGNOSIS — R143 Flatulence: Secondary | ICD-10-CM

## 2021-06-26 DIAGNOSIS — H9193 Unspecified hearing loss, bilateral: Secondary | ICD-10-CM

## 2021-06-26 DIAGNOSIS — E785 Hyperlipidemia, unspecified: Secondary | ICD-10-CM

## 2021-06-26 DIAGNOSIS — I10 Essential (primary) hypertension: Secondary | ICD-10-CM | POA: Diagnosis not present

## 2021-06-26 DIAGNOSIS — E1142 Type 2 diabetes mellitus with diabetic polyneuropathy: Secondary | ICD-10-CM

## 2021-06-26 DIAGNOSIS — K219 Gastro-esophageal reflux disease without esophagitis: Secondary | ICD-10-CM

## 2021-06-26 NOTE — Progress Notes (Addendum)
Provider: Marlowe Sax FNP-C   Madyn Ivins, Nelda Bucks, NP  Patient Care Team: Margerite Impastato, Nelda Bucks, NP as PCP - General (Family Medicine) Teena Irani, MD (Inactive) as Consulting Physician (Gastroenterology) Warden Fillers, MD as Consulting Physician (Ophthalmology) [name], MD as Consulting Physician (Ophthalmology) Mickel Fuchs, MD as Referring Physician (Psychiatry) Velora Heckler (Orthotics)  Extended Emergency Contact Information Primary Emergency Contact: [place] Phone: [phone] Relation: None  Code Status:  Full Code  Goals of care: Advanced Directive information Advanced Directives 06/26/2021  Does Patient Have a Medical Advance Directive? No  Type of Advance Directive -  Does patient want to make changes to medical advance directive? -  Would patient like information on creating a medical advance directive? No - Patient declined     Chief Complaint  Patient presents with   Medical Management of Chronic Issues    6 month follow up   Health Maintenance    Discuss the need for Foot exam.   Immunizations    Discuss the need for Shingrix vaccine, and 2nd Covid Booster.    HPI:  Pt is a 80 y.o. female seen today for medical management of chronic diseases.   Recent lab work reviewed and discussed during visit.Labs unremarkable except Glucose 119,Hgb A1C has improved compared to previous level.No home CBG for review.currently on sitaGliptin 25 mg tablet daily.report no signs of hypoglycemia.  She denies any acute issues.States previous back spasm has improved.robaxin was effective. Not exercising much as sh would like but stays busy taking care of the Husband who has cancer. She has had 4 lbs weight loss since last visit.Appetite is described as good. No fall episode or recent hospitalization.  Due for Shingrix and 2 nd COVID-19 will get at her pharmacy   Past Medical History:  Diagnosis Date   Acute upper respiratory infections of unspecified site     Acute upper respiratory infections of unspecified site    Acute upper respiratory infections of unspecified site    Allergic rhinitis due to pollen    Candidiasis of mouth    Disorder of bone and cartilage, unspecified    Diverticulitis of colon (without mention of hemorrhage)(562.11)    Essential hypertension, benign    Essential hypertension, benign    Extrinsic asthma, unspecified    Herpes simplex with other ophthalmic complications    Other abnormal glucose    Other and unspecified hyperlipidemia    Other and unspecified hyperlipidemia    Other and unspecified hyperlipidemia    Other cataract    Other cataract    Other dystrophy of vulva    Other malaise and fatigue    Rash and other nonspecific skin eruption    Reflux esophagitis    Thrombocytopenia, unspecified (HCC)    Type II or unspecified type diabetes mellitus without mention of complication, not stated as uncontrolled    Type II or unspecified type diabetes mellitus without mention of complication, not stated as uncontrolled    Unspecified disorder of skin and subcutaneous tissue    Unspecified essential hypertension    Unspecified essential hypertension    Past Surgical History:  Procedure Laterality Date   ABDOMINAL HYSTERECTOMY     CESAREAN SECTION     [phone]   CYST EXCISION  1974   Pilonidal Cyst excision   EYE SURGERY Right 04/14/2017   Dr. Zadie Rhine   KNEE ARTHROSCOPY Right    for meniscal tear   RETINAL TEAR REPAIR CRYOTHERAPY  10/21/2017   Getting injections in right eye  Allergies  Allergen Reactions   Crestor [Rosuvastatin Calcium]     Muscle cramps   Latex Other (See Comments)    GLOVES   Sulfa Antibiotics     Allergies as of 06/26/2021       Reactions   Crestor [rosuvastatin Calcium]    Muscle cramps   Latex Other (See Comments)   GLOVES   Sulfa Antibiotics         Medication List        Accurate as of June 26, 2021  9:31 AM. If you have any questions, ask your  nurse or doctor.          STOP taking these medications    metFORMIN 500 MG tablet Commonly known as: GLUCOPHAGE Stopped by: Sandrea Hughs, NP       TAKE these medications    acetaminophen 325 MG tablet Commonly known as: TYLENOL Take 325 mg by mouth daily as needed. For pain   albuterol 108 (90 Base) MCG/ACT inhaler Commonly known as: Ventolin HFA Inhale 2 puffs into the lungs every 6 (six) hours as needed for wheezing or shortness of breath.   amLODipine 5 MG tablet Commonly known as: NORVASC Take 1 tablet (5 mg total) by mouth daily.   benzonatate 100 MG capsule Commonly known as: TESSALON Take 1-2 capsules (100-200 mg total) by mouth 3 (three) times daily as needed for cough.   budesonide-formoterol 160-4.5 MCG/ACT inhaler Commonly known as: SYMBICORT Inhale 2 puffs into the lungs 2 (two) times daily. PLEASE GIVE PATIENT 3 MONTH SUPPLY.   calcium-vitamin D 500-200 MG-UNIT tablet Commonly known as: OSCAL WITH D Take 1 tablet by mouth 2 (two) times daily.   carvedilol 12.5 MG tablet Commonly known as: COREG TAKE 1 TABLET ONCE DAILY FOR BLOOD PRESSURE   ELDERBERRY PO Take by mouth 3 (three) times a week.   ezetimibe 10 MG tablet Commonly known as: ZETIA Take 1 tablet (10 mg total) by mouth daily.   fexofenadine 180 MG tablet Commonly known as: ALLEGRA Take 180 mg by mouth as needed.   KETOROLAC TROMETHAMINE OP Apply 1 drop to eye 4 (four) times daily. Right eye   ketorolac 0.5 % ophthalmic solution Commonly known as: ACULAR Place 1 drop into the right eye 4 (four) times daily.   losartan 100 MG tablet Commonly known as: Cozaar Take 1 tablet (100 mg total) by mouth daily.   Magnesium Oxide 500 MG Caps Take 2 capsules by mouth at bedtime.   methocarbamol 500 MG tablet Commonly known as: Robaxin Take 1 tablet (500 mg total) by mouth every 8 (eight) hours as needed for muscle spasms.   mometasone 50 MCG/ACT nasal spray Commonly known as:  NASONEX Place 1 spray into the nose 2 (two) times daily as needed.   ondansetron 4 MG disintegrating tablet Commonly known as: ZOFRAN-ODT Take 1 tablet (4 mg total) by mouth every 8 (eight) hours as needed for nausea or vomiting.   prednisoLONE acetate 1 % ophthalmic suspension Commonly known as: PRED FORTE Place 1 drop into the right eye 4 (four) times daily.   psyllium 0.52 g capsule Commonly known as: REGULOID Take 0.52 g by mouth daily.   simethicone 80 MG chewable tablet Commonly known as: Gas-X Chew 1 tablet (80 mg total) by mouth every 6 (six) hours as needed for flatulence.   sitaGLIPtin 25 MG tablet Commonly known as: JANUVIA Take 1 tablet (25 mg total) by mouth daily.   sodium chloride 0.65 % Soln nasal spray  Commonly known as: OCEAN Place 1 spray into both nostrils as needed for congestion.   ZINC PO Take 1 tablet by mouth once a week.        Review of Systems  Constitutional:  Negative for appetite change, chills, fatigue, fever and unexpected weight change.  HENT:  Negative for congestion, dental problem, ear discharge, ear pain, facial swelling, hearing loss, nosebleeds, postnasal drip, rhinorrhea, sinus pressure, sinus pain, sneezing, sore throat, tinnitus and trouble swallowing.   Eyes:  Negative for pain, discharge, redness, itching and visual disturbance.  Respiratory:  Negative for cough, chest tightness, shortness of breath and wheezing.   Cardiovascular:  Negative for chest pain, palpitations and leg swelling.  Gastrointestinal:  Negative for abdominal distention, abdominal pain, blood in stool, constipation, diarrhea, nausea and vomiting.  Endocrine: Negative for cold intolerance, heat intolerance, polydipsia, polyphagia and polyuria.  Genitourinary:  Negative for difficulty urinating, dysuria, flank pain, frequency and urgency.  Musculoskeletal:  Negative for arthralgias, back pain, gait problem, joint swelling, myalgias, neck pain and neck stiffness.   Skin:  Negative for color change, pallor, rash and wound.  Neurological:  Negative for dizziness, syncope, speech difficulty, weakness, light-headedness, numbness and headaches.  Hematological:  Does not bruise/bleed easily.  Psychiatric/Behavioral:  Negative for agitation, behavioral problems, confusion, hallucinations, self-injury, sleep disturbance and suicidal ideas. The patient is not nervous/anxious.    Immunization History  Administered Date(s) Administered   Fluad Quad(high Dose 65+) 08/29/2019, 09/02/2020   Influenza Split 08/25/2010, 08/26/2011, 10/04/2012   Influenza Whole 09/24/2009   Influenza, High Dose Seasonal PF 08/16/2018   Influenza,inj,Quad PF,6+ Mos 09/11/2013, 09/04/2014, 11/12/2015, 10/04/2016, 09/06/2017   Influenza-Unspecified 09/02/2018   PFIZER(Purple Top)SARS-COV-2 Vaccination 02/11/2020, 03/03/2020, 09/17/2020   Pneumococcal Conjugate-13 02/17/2012   Pneumococcal Polysaccharide-23 05/19/2016   Tdap 02/17/2012   Pertinent  Health Maintenance Due  Topic Date Due   FOOT EXAM  05/29/2021   OPHTHALMOLOGY EXAM  07/02/2021   INFLUENZA VACCINE  07/13/2021   HEMOGLOBIN A1C  12/25/2021   DEXA SCAN  Completed   PNA vac Low Risk Adult  Completed   Fall Risk  06/26/2021 06/08/2021 01/12/2021 12/31/2020 12/26/2020  Falls in the past year? 0 0 0 0 0  Number falls in past yr: 0 0 0 0 0  Injury with Fall? 0 0 0 0 0  Risk for fall due to : No Fall Risks No Fall Risks - - -  Follow up Falls evaluation completed Falls evaluation completed - - -   Functional Status Survey:    Vitals:   06/26/21 0915  BP: 140/88  Pulse: 67  Resp: 16  Temp: (!) 97.5 F (36.4 C)  SpO2: 96%  Weight: 160 lb 6.4 oz (72.8 kg)  Height: 5' 1"  (1.549 m)   Body mass index is 30.31 kg/m. Physical Exam Vitals reviewed.  Constitutional:      General: She is not in acute distress.    Appearance: Normal appearance. She is obese. She is not ill-appearing or diaphoretic.  HENT:     Head:  Normocephalic.     Right Ear: Tympanic membrane, ear canal and external ear normal. There is no impacted cerumen.     Left Ear: Tympanic membrane, ear canal and external ear normal. There is no impacted cerumen.     Nose: Nose normal. No congestion or rhinorrhea.     Mouth/Throat:     Mouth: Mucous membranes are moist.     Pharynx: Oropharynx is clear. No oropharyngeal exudate or posterior oropharyngeal erythema.  Eyes:     General: No scleral icterus.       Right eye: No discharge.        Left eye: No discharge.     Extraocular Movements: Extraocular movements intact.     Conjunctiva/sclera: Conjunctivae normal.     Pupils: Pupils are equal, round, and reactive to light.  Neck:     Vascular: No carotid bruit.  Cardiovascular:     Rate and Rhythm: Normal rate and regular rhythm.     Pulses: Normal pulses.     Heart sounds: Normal heart sounds. No murmur heard.   No friction rub. No gallop.  Pulmonary:     Effort: Pulmonary effort is normal. No respiratory distress.     Breath sounds: Normal breath sounds. No wheezing, rhonchi or rales.  Chest:     Chest wall: No tenderness.  Abdominal:     General: Bowel sounds are normal. There is no distension.     Palpations: Abdomen is soft. There is no mass.     Tenderness: There is no abdominal tenderness. There is no right CVA tenderness, left CVA tenderness, guarding or rebound.  Musculoskeletal:        General: No swelling or tenderness. Normal range of motion.     Cervical back: Normal range of motion. No rigidity or tenderness.     Right lower leg: No edema.     Left lower leg: No edema.  Lymphadenopathy:     Cervical: No cervical adenopathy.  Skin:    General: Skin is warm and dry.     Coloration: Skin is not pale.     Findings: No bruising, erythema, lesion or rash.  Neurological:     Mental Status: She is alert and oriented to person, place, and time.     Cranial Nerves: No cranial nerve deficit.     Sensory: No sensory  deficit.     Motor: No weakness.     Coordination: Coordination normal.     Gait: Gait normal.  Psychiatric:        Mood and Affect: Mood normal.        Speech: Speech normal.        Behavior: Behavior normal.        Thought Content: Thought content normal.        Judgment: Judgment normal.    Labs reviewed: Recent Labs    12/22/20 0828 06/08/21 1406 06/24/21 0000  NA 144 140 140  K 4.5 3.9 3.9  CL 107 105 106  CO2 28 26 24   GLUCOSE 125* 65 119*  BUN 18 13 15   CREATININE 0.77 0.62 0.69  CALCIUM 9.9 9.5 9.2   Recent Labs    12/22/20 0828 06/08/21 1406 06/24/21 0000  AST 15 15 12   ALT 14 15 12   BILITOT 0.6 0.4 0.6  PROT 7.2 7.4 7.2   Recent Labs    12/22/20 0828 06/08/21 1406 06/24/21 0000  WBC 6.7 6.7 6.7  NEUTROABS 2,747 2,915 2,868  HGB 13.9 13.9 13.7  HCT 41.8 41.8 42.2  MCV 78.7* 78.3* 78.7*  PLT 181 131* 167   Lab Results  Component Value Date   TSH 2.05 06/24/2021   Lab Results  Component Value Date   HGBA1C 6.2 (H) 06/24/2021   Lab Results  Component Value Date   CHOL 153 06/24/2021   HDL 57 06/24/2021   LDLCALC 83 06/24/2021   TRIG 54 06/24/2021   CHOLHDL 2.7 06/24/2021    Significant Diagnostic Results in  last 30 days:  No results found.  Assessment/Plan 1. Essential hypertension B/p stable. Continue on amlodipine, coreg and losartan  - CBC with Differential/Platelet; Future - CMP with eGFR([facility]); Future - TSH; Future  2. Controlled type 2 diabetes mellitus with diabetic polyneuropathy, without long-term current use of insulin (HCC) Lab Results  Component Value Date   HGBA1C 6.2 (H) 06/24/2021  Well controlled - check CBG once daily and record,bring log to visit.  Continue on Januvia  Not on Statin due to allergies. - continue on losartan for renal protection  - Hemoglobin A1c; Future  3. Hyperlipidemia LDL goal <100 LDL at goal  Off statin due to allergies  - continue on ezetimibe  - Lipid panel; Future  4.  Seasonal allergic rhinitis due to pollen Fexofenadine and Nasonex  effective  5. Gastroesophageal reflux disease without esophagitis Symptoms well controlled Reports no signs of GI bleed   6. Flatulence Intermittent  Simethicone effective   7. Decreased hearing of both ears No pain in ear. - Ambulatory referral to ENT  Family/ staff Communication: Reviewed plan of care with patient verbalized understanding  Labs/tests ordered:  - CBC with Differential/Platelet - CMP with eGFR([facility]) - TSH - Hgb A1C - Lipid panel  Next Appointment : 6 months for medical management of chronic issues.Fasting Labs prior to visit.    Sandrea Hughs, NP

## 2021-06-26 NOTE — Patient Instructions (Signed)
-   continue current medication  - Take OTC Glucosamine one daily for Joint

## 2021-07-04 DIAGNOSIS — Z23 Encounter for immunization: Secondary | ICD-10-CM | POA: Diagnosis not present

## 2021-07-13 ENCOUNTER — Telehealth: Payer: Self-pay

## 2021-07-13 ENCOUNTER — Ambulatory Visit (INDEPENDENT_AMBULATORY_CARE_PROVIDER_SITE_OTHER): Payer: Medicare Other | Admitting: Family

## 2021-07-13 ENCOUNTER — Other Ambulatory Visit: Payer: Self-pay

## 2021-07-13 ENCOUNTER — Encounter: Payer: Self-pay | Admitting: Family

## 2021-07-13 VITALS — BP 148/70 | HR 71 | Temp 97.7°F | Resp 16 | Ht 61.0 in | Wt 161.4 lb

## 2021-07-13 DIAGNOSIS — K648 Other hemorrhoids: Secondary | ICD-10-CM

## 2021-07-13 DIAGNOSIS — K644 Residual hemorrhoidal skin tags: Secondary | ICD-10-CM

## 2021-07-13 MED ORDER — PHENYLEPHRINE IN HARD FAT 0.25 % RE SUPP: 1.0000 | Freq: Two times a day (BID) | RECTAL | 0 refills | Status: DC

## 2021-07-13 NOTE — Telephone Encounter (Signed)
Per patient Linzess was being prescribed by GI which she said recommended Linzess,magnesium oxide and Metamucil.if not taking Linzess I would recommend using metamucil and magnesium oxide for now then recommend getting appointment with GI

## 2021-07-13 NOTE — Telephone Encounter (Signed)
Patient states medication prescribed during visit today was not sent into pharmacy. Prescription resent.

## 2021-07-13 NOTE — Progress Notes (Signed)
Provider: Marlowe Sax FNP-C  Anokhi Shannon, Nelda Bucks, NP  Patient Care Team: Kristel Durkee, Nelda Bucks, NP as PCP - General (Family Medicine) Teena Irani, MD (Inactive) as Consulting Physician (Gastroenterology) Warden Fillers, MD as Consulting Physician (Ophthalmology) [name], MD as Consulting Physician (Ophthalmology) Mickel Fuchs, MD as Referring Physician (Psychiatry) Velora Heckler (Orthotics)  Extended Emergency Contact Information Primary Emergency Contact: [place] Phone: [phone] Relation: None  Code Status:  Full Code  Goals of care: Advanced Directive information Advanced Directives 06/26/2021  Does Patient Have a Medical Advance Directive? No  Type of Advance Directive -  Does patient want to make changes to medical advance directive? -  Would patient like information on creating a medical advance directive? No - Patient declined     Chief Complaint  Patient presents with   Acute Visit    Patient complains of Hemorrhoids x 1-2 weeks.    HPI:  Pt is a 80 y.o. female seen today for an acute visit for evaluation of Hemorrhoids x 1-2 weeks. States saw GI advised her to take metamucil ,magnesium oxide and Linzzes. Bowels move every day sometimes might seems like diarrhea and moves more often. No hard stool reported.states was told by specialist diarrhea can also irritate the hemorrhoids. Has sharp pain in the right gluteal area whenever hemorrhoids flare up worst with when she walks. Tries to eat a high fiber diet and drinks plenty of water.she does not exercise much as she would like to. Appetite described as good. She denies any blood in the stool or on tissues when wiping self after her bowels move.  Gas -X effective for gas.    Past Medical History:  Diagnosis Date   Acute upper respiratory infections of unspecified site    Acute upper respiratory infections of unspecified site    Acute upper respiratory infections of unspecified site    Allergic  rhinitis due to pollen    Candidiasis of mouth    Disorder of bone and cartilage, unspecified    Diverticulitis of colon (without mention of hemorrhage)(562.11)    Essential hypertension, benign    Essential hypertension, benign    Extrinsic asthma, unspecified    Herpes simplex with other ophthalmic complications    Other abnormal glucose    Other and unspecified hyperlipidemia    Other and unspecified hyperlipidemia    Other and unspecified hyperlipidemia    Other cataract    Other cataract    Other dystrophy of vulva    Other malaise and fatigue    Rash and other nonspecific skin eruption    Reflux esophagitis    Thrombocytopenia, unspecified (HCC)    Type II or unspecified type diabetes mellitus without mention of complication, not stated as uncontrolled    Type II or unspecified type diabetes mellitus without mention of complication, not stated as uncontrolled    Unspecified disorder of skin and subcutaneous tissue    Unspecified essential hypertension    Unspecified essential hypertension    Past Surgical History:  Procedure Laterality Date   ABDOMINAL HYSTERECTOMY     CESAREAN SECTION     [phone]   CYST EXCISION  1974   Pilonidal Cyst excision   EYE SURGERY Right 04/14/2017   Dr. Zadie Rhine   KNEE ARTHROSCOPY Right    for meniscal tear   RETINAL TEAR REPAIR CRYOTHERAPY  10/21/2017   Getting injections in right eye    Allergies  Allergen Reactions   Crestor [Rosuvastatin Calcium]     Muscle cramps  Latex Other (See Comments)    GLOVES   Sulfa Antibiotics     Outpatient Encounter Medications as of 07/13/2021  Medication Sig   acetaminophen (TYLENOL) 325 MG tablet Take 325 mg by mouth daily as needed. For pain   albuterol (VENTOLIN HFA) 108 (90 Base) MCG/ACT inhaler Inhale 2 puffs into the lungs every 6 (six) hours as needed for wheezing or shortness of breath.   amLODipine (NORVASC) 5 MG tablet Take 1 tablet (5 mg total) by mouth daily.    budesonide-formoterol (SYMBICORT) 160-4.5 MCG/ACT inhaler Inhale 2 puffs into the lungs 2 (two) times daily. PLEASE GIVE PATIENT 3 MONTH SUPPLY.   calcium-vitamin D (OSCAL WITH D) 500-200 MG-UNIT per tablet Take 1 tablet by mouth 2 (two) times daily.   carvedilol (COREG) 12.5 MG tablet TAKE 1 TABLET ONCE DAILY FOR BLOOD PRESSURE   ELDERBERRY PO Take by mouth 3 (three) times a week.    ezetimibe (ZETIA) 10 MG tablet Take 1 tablet (10 mg total) by mouth daily.   fexofenadine (ALLEGRA) 180 MG tablet Take 180 mg by mouth as needed.    ketorolac (ACULAR) 0.5 % ophthalmic solution Place 1 drop into the right eye 4 (four) times daily.   KETOROLAC TROMETHAMINE OP Apply 1 drop to eye 4 (four) times daily. Right eye   losartan (COZAAR) 100 MG tablet Take 1 tablet (100 mg total) by mouth daily.   Magnesium Oxide 500 MG CAPS Take 2 capsules by mouth at bedtime.   methocarbamol (ROBAXIN) 500 MG tablet Take 1 tablet (500 mg total) by mouth every 8 (eight) hours as needed for muscle spasms.   mometasone (NASONEX) 50 MCG/ACT nasal spray Place 1 spray into the nose 2 (two) times daily as needed.   Multiple Vitamins-Minerals (ZINC PO) Take 1 tablet by mouth once a week.   ondansetron (ZOFRAN-ODT) 4 MG disintegrating tablet Take 1 tablet (4 mg total) by mouth every 8 (eight) hours as needed for nausea or vomiting.   prednisoLONE acetate (PRED FORTE) 1 % ophthalmic suspension Place 1 drop into the right eye 4 (four) times daily.    psyllium (REGULOID) 0.52 g capsule Take 0.52 g by mouth daily.   simethicone (GAS-X) 80 MG chewable tablet Chew 1 tablet (80 mg total) by mouth every 6 (six) hours as needed for flatulence.   sitaGLIPtin (JANUVIA) 25 MG tablet Take 1 tablet (25 mg total) by mouth daily.   sodium chloride (OCEAN) 0.65 % SOLN nasal spray Place 1 spray into both nostrils as needed for congestion.   No facility-administered encounter medications on file as of 07/13/2021.    Review of Systems   Constitutional:  Negative for appetite change, chills, fatigue, fever and unexpected weight change.  Respiratory:  Negative for cough, chest tightness, shortness of breath and wheezing.   Cardiovascular:  Negative for chest pain, palpitations and leg swelling.  Gastrointestinal:  Negative for abdominal distention, abdominal pain, blood in stool, constipation, diarrhea, nausea and vomiting.       Hemorrhoids    Genitourinary:  Negative for difficulty urinating, dysuria, flank pain, frequency and urgency.  Musculoskeletal:  Negative for arthralgias, back pain, gait problem, joint swelling, myalgias, neck pain and neck stiffness.  Skin:  Negative for color change, pallor, rash and wound.  Neurological:  Negative for dizziness, syncope, speech difficulty, weakness, light-headedness, numbness and headaches.  Hematological:  Does not bruise/bleed easily.   Immunization History  Administered Date(s) Administered   Fluad Quad(high Dose 65+) 08/29/2019, 09/02/2020   Influenza Split 08/25/2010,  08/26/2011, 10/04/2012   Influenza Whole 09/24/2009   Influenza, High Dose Seasonal PF 08/16/2018   Influenza,inj,Quad PF,6+ Mos 09/11/2013, 09/04/2014, 11/12/2015, 10/04/2016, 09/06/2017   Influenza-Unspecified 09/02/2018   PFIZER(Purple Top)SARS-COV-2 Vaccination 02/11/2020, 03/03/2020, 09/17/2020   Pneumococcal Conjugate-13 02/17/2012   Pneumococcal Polysaccharide-23 05/19/2016   Tdap 02/17/2012   Pertinent  Health Maintenance Due  Topic Date Due   FOOT EXAM  05/29/2021   OPHTHALMOLOGY EXAM  07/02/2021   INFLUENZA VACCINE  07/13/2021   HEMOGLOBIN A1C  12/25/2021   DEXA SCAN  Completed   PNA vac Low Risk Adult  Completed   Fall Risk  06/26/2021 06/08/2021 01/12/2021 12/31/2020 12/26/2020  Falls in the past year? 0 0 0 0 0  Number falls in past yr: 0 0 0 0 0  Injury with Fall? 0 0 0 0 0  Risk for fall due to : No Fall Risks No Fall Risks - - -  Follow up Falls evaluation completed Falls evaluation  completed - - -   Functional Status Survey:    Vitals:   07/13/21 0940  BP: (!) 148/70  Pulse: 71  Resp: 16  Temp: 97.7 F (36.5 C)  SpO2: 93%  Weight: 161 lb 6.4 oz (73.2 kg)  Height: '5\' 1"'$  (1.549 m)   Body mass index is 30.5 kg/m. Physical Exam Vitals reviewed. Exam conducted with a chaperone present [facility],CMA).  Constitutional:      General: She is not in acute distress.    Appearance: Normal appearance. She is normal weight. She is not ill-appearing or diaphoretic.  HENT:     Head: Normocephalic.     Mouth/Throat:     Mouth: Mucous membranes are moist.     Pharynx: Oropharynx is clear. No oropharyngeal exudate or posterior oropharyngeal erythema.  Eyes:     General: No scleral icterus.       Right eye: No discharge.        Left eye: No discharge.     Extraocular Movements: Extraocular movements intact.     Conjunctiva/sclera: Conjunctivae normal.     Pupils: Pupils are equal, round, and reactive to light.  Cardiovascular:     Rate and Rhythm: Normal rate and regular rhythm.     Pulses: Normal pulses.     Heart sounds: Normal heart sounds. No murmur heard.   No friction rub. No gallop.  Pulmonary:     Effort: Pulmonary effort is normal. No respiratory distress.     Breath sounds: Normal breath sounds. No wheezing, rhonchi or rales.  Chest:     Chest wall: No tenderness.  Abdominal:     General: Bowel sounds are normal. There is no distension.     Palpations: Abdomen is soft. There is no mass.     Tenderness: There is no abdominal tenderness. There is no right CVA tenderness, left CVA tenderness, guarding or rebound.  Genitourinary:    Exam position: Knee-chest position.     Rectum: Guaiac result negative. External hemorrhoid and internal hemorrhoid present. No mass, tenderness or anal fissure. Normal anal tone.  Musculoskeletal:        General: No swelling or tenderness. Normal range of motion.     Right lower leg: No edema.     Left lower leg: No  edema.  Skin:    General: Skin is warm and dry.     Coloration: Skin is not pale.     Findings: No bruising, erythema, lesion or rash.  Neurological:     Mental Status: She  is alert. Mental status is at baseline.     Motor: No weakness.     Gait: Gait normal.  Psychiatric:        Mood and Affect: Mood normal.        Speech: Speech normal.        Behavior: Behavior normal.        Thought Content: Thought content normal.        Judgment: Judgment normal.    Labs reviewed: Recent Labs    12/22/20 0828 06/08/21 1406 06/24/21 0000  NA 144 140 140  K 4.5 3.9 3.9  CL 107 105 106  CO2 '28 26 24  '$ GLUCOSE 125* 65 119*  BUN '18 13 15  '$ CREATININE 0.77 0.62 0.69  CALCIUM 9.9 9.5 9.2   Recent Labs    12/22/20 0828 06/08/21 1406 06/24/21 0000  AST '15 15 12  '$ ALT '14 15 12  '$ BILITOT 0.6 0.4 0.6  PROT 7.2 7.4 7.2   Recent Labs    12/22/20 0828 06/08/21 1406 06/24/21 0000  WBC 6.7 6.7 6.7  NEUTROABS 2,747 2,915 2,868  HGB 13.9 13.9 13.7  HCT 41.8 41.8 42.2  MCV 78.7* 78.3* 78.7*  PLT 181 131* 167   Lab Results  Component Value Date   TSH 2.05 06/24/2021   Lab Results  Component Value Date   HGBA1C 6.2 (H) 06/24/2021   Lab Results  Component Value Date   CHOL 153 06/24/2021   HDL 57 06/24/2021   LDLCALC 83 06/24/2021   TRIG 54 06/24/2021   CHOLHDL 2.7 06/24/2021    Significant Diagnostic Results in last 30 days:  No results found.  Assessment/Plan  1. Hemorrhoids, internal Small non-tender hemorrhoids noted on 5 O'clock position.Possible referred pain to right gluteal.will treat hemorrhoids then evaluate gluteal pain if not resolved.  - encouraged to include fiber in diet and water intake - exercise at least three times per week for 30 minutes.  - continue on Magnesium oxide,Metamucil and Linzess per GI recommendation.  - phenylephrine (,USE FOR PREPARATION-H,) 0.25 % suppository; Place 1 suppository rectally 2 (two) times daily.  Dispense: 12 suppository;  Refill: 0  2. Hemorrhoids, external without complications Pink non-tender hemorrhoid noted on 7 O'clock position No fissure noted.   Family/ staff Communication: Reviewed plan of care with patient verbalized understanding.   Labs/tests ordered: None   Next Appointment: As needed if symptoms worsen or fail to improve    Sandrea Hughs, NP

## 2021-07-13 NOTE — Telephone Encounter (Signed)
Incoming call received from patient stating she was under the impression that [name] was going to prescribe something different for her, as she told Dinah that she has already tried preparation H (which was sent in).  Patient also mentioned that she told Dinah that she does not have an active rx for linzess and has not taking it in a long time. Patient has not seen the GI doctor recently and wonders if [name] will send in rx for linzess or if she will have to call the GI doctor to get an appointment   Please advise on both concerns

## 2021-07-13 NOTE — Patient Instructions (Signed)

## 2021-07-14 NOTE — Telephone Encounter (Signed)
Left message on voicemail for patient to return call when available   

## 2021-07-14 NOTE — Telephone Encounter (Signed)
Patient did not mention use of preparation H when she was told to pick it up from pharmacy.Preparation H was not on current medication.follow up with GI.

## 2021-07-14 NOTE — Telephone Encounter (Signed)
Spoke with patient, patient states she should of been told that she needs to see the GI when she was here. Patient states as she reflects back on the visit she has concluded that [place] didn't do anything for her.    1.) Patient states she was already using the preparation H and you sent in another rx for that, and she was under the impression that you were going to send in a different, better suppository, and that did not happen  2.) You can not rx the Linzess and she now has to get an appointment with GI and wait for them to assess her  3.) The magnesium and metamucil was recommended and she was already doing that   Patient states she will follow-up with GI yet she thinks it's important that Tanya Bell is aware of how she interpreted the visit

## 2021-07-21 ENCOUNTER — Other Ambulatory Visit: Payer: Self-pay

## 2021-07-21 DIAGNOSIS — I10 Essential (primary) hypertension: Secondary | ICD-10-CM

## 2021-07-21 MED ORDER — AMLODIPINE BESYLATE 5 MG PO TABS: 5.0000 mg | ORAL_TABLET | Freq: Every day | ORAL | 3 refills | Status: DC

## 2021-07-21 NOTE — Telephone Encounter (Signed)
Patient aware her message for refill request was received and complete

## 2021-07-23 DIAGNOSIS — H35372 Puckering of macula, left eye: Secondary | ICD-10-CM | POA: Diagnosis not present

## 2021-07-23 DIAGNOSIS — H3521 Other non-diabetic proliferative retinopathy, right eye: Secondary | ICD-10-CM | POA: Diagnosis not present

## 2021-07-23 DIAGNOSIS — Z8669 Personal history of other diseases of the nervous system and sense organs: Secondary | ICD-10-CM | POA: Diagnosis not present

## 2021-07-23 DIAGNOSIS — D573 Sickle-cell trait: Secondary | ICD-10-CM | POA: Diagnosis not present

## 2021-07-23 DIAGNOSIS — H35351 Cystoid macular degeneration, right eye: Secondary | ICD-10-CM | POA: Diagnosis not present

## 2021-07-23 DIAGNOSIS — E119 Type 2 diabetes mellitus without complications: Secondary | ICD-10-CM | POA: Diagnosis not present

## 2021-07-23 DIAGNOSIS — H35371 Puckering of macula, right eye: Secondary | ICD-10-CM | POA: Diagnosis not present

## 2021-07-28 ENCOUNTER — Ambulatory Visit (INDEPENDENT_AMBULATORY_CARE_PROVIDER_SITE_OTHER): Payer: Medicare Other | Admitting: Podiatry

## 2021-07-28 ENCOUNTER — Encounter: Payer: Self-pay | Admitting: Podiatry

## 2021-07-28 ENCOUNTER — Other Ambulatory Visit: Payer: Self-pay

## 2021-07-28 DIAGNOSIS — B351 Tinea unguium: Secondary | ICD-10-CM | POA: Diagnosis not present

## 2021-07-28 DIAGNOSIS — M79674 Pain in right toe(s): Secondary | ICD-10-CM

## 2021-07-28 DIAGNOSIS — E119 Type 2 diabetes mellitus without complications: Secondary | ICD-10-CM

## 2021-07-28 DIAGNOSIS — M79675 Pain in left toe(s): Secondary | ICD-10-CM

## 2021-07-28 DIAGNOSIS — Q828 Other specified congenital malformations of skin: Secondary | ICD-10-CM

## 2021-08-01 NOTE — Progress Notes (Signed)
Subjective: Tanya Bell is a pleasant 80 y.o. female patient seen for diabetic foot care. She is seen for calluses of both feet and painful thick toenails that are difficult to trim. Pain interferes with ambulation. Aggravating factors include wearing enclosed shoe gear. Pain is relieved with periodic professional debridement.  She states her blood glucose reading was 116 mg/dl.  PCP is Ngetich, Nelda Bucks, NP. Last visit was: 07/13/2021.  Allergies  Allergen Reactions   Crestor [Rosuvastatin Calcium]     Muscle cramps   Latex Other (See Comments)    GLOVES   Sulfa Antibiotics     Objective: Physical Exam  General: Tanya Bell is a pleasant 80 y.o. African American female, WD, WN in NAD. AAO x 3.   Vascular:  Capillary refill time to digits immediate b/l. Palpable DP pulse(s) b/l lower extremities Palpable PT pulse(s) b/l lower extremities Pedal hair present. Lower extremity skin temperature gradient within normal limits. No pain with calf compression b/l. Varicosities present b/l.  Dermatological:  Pedal skin with normal turgor, texture and tone b/l lower extremities. No open wounds b/l lower extremities. No interdigital macerations b/l lower extremities. Toenails digits 1-4 bilaterally elongated, discolored, dystrophic, thickened, and crumbly with subungual debris and tenderness to dorsal palpation. Anonychia noted L 5th toe and R 5th toe. Nailbed(s) epithelialized.  Porokeratotic lesion(s) submet head 1 left foot, submet head 1 right foot, submet head 5 left foot, and submet head 5 right foot. No erythema, no edema, no drainage, no fluctuance.  Musculoskeletal:  Normal muscle strength 5/5 to all lower extremity muscle groups bilaterally. Hallux valgus with bunion deformity noted b/l lower extremities. Hammertoe(s) noted to the L 5th toe and R 5th toe.  Neurological:  Protective sensation intact 5/5 intact bilaterally with 10g monofilament b/l. Vibratory sensation  intact b/l.  Assessment and Plan:  1. Pain due to onychomycosis of toenails of both feet   2. Porokeratosis   3. Controlled type 2 diabetes mellitus without complication, without long-term current use of insulin ([place])      -Examined patient. -Continue diabetic foot care principles: inspect feet daily, monitor glucose as recommended by PCP and/or Endocrinologist, and follow prescribed diet per PCP, Endocrinologist and/or dietician. -Patient to continue soft, supportive shoe gear daily. -Toenails 1-4 b/l were debrided in length and girth with sterile nail nippers and dremel without iatrogenic bleeding.  -Painful porokeratotic lesion(s) submet head 1 left foot, submet head 1 right foot, submet head 5 left foot, and submet head 5 right foot pared and enucleated with sterile scalpel blade without incident. Total number of lesions debrided=4. -Patient to report any pedal injuries to medical professional immediately. -Patient/POA to call should there be question/concern in the interim.  Return in about 3 months (around 10/28/2021).  Marzetta Board, DPM

## 2021-08-05 ENCOUNTER — Ambulatory Visit (INDEPENDENT_AMBULATORY_CARE_PROVIDER_SITE_OTHER): Payer: Medicare Other | Admitting: Otolaryngology

## 2021-08-05 ENCOUNTER — Other Ambulatory Visit: Payer: Self-pay

## 2021-08-05 DIAGNOSIS — H903 Sensorineural hearing loss, bilateral: Secondary | ICD-10-CM | POA: Diagnosis not present

## 2021-08-05 DIAGNOSIS — H9312 Tinnitus, left ear: Secondary | ICD-10-CM | POA: Diagnosis not present

## 2021-08-05 NOTE — Progress Notes (Signed)
HPI: Tanya Bell is a 80 y.o. female who presents is referred by her PCP for evaluation of an intermittent clicking sound in the left ear that she has had for several months now.  The sound only occurs every few weeks and would last for a couple minutes at a time.  She has not noted any pain or discomfort associated with this.  She had no drainage from the ear.  She has not noted any hearing problems.  She is not sure what causes it..  Past Medical History:  Diagnosis Date   Acute upper respiratory infections of unspecified site    Acute upper respiratory infections of unspecified site    Acute upper respiratory infections of unspecified site    Allergic rhinitis due to pollen    Candidiasis of mouth    Disorder of bone and cartilage, unspecified    Diverticulitis of colon (without mention of hemorrhage)(562.11)    Essential hypertension, benign    Essential hypertension, benign    Extrinsic asthma, unspecified    Herpes simplex with other ophthalmic complications    Other abnormal glucose    Other and unspecified hyperlipidemia    Other and unspecified hyperlipidemia    Other and unspecified hyperlipidemia    Other cataract    Other cataract    Other dystrophy of vulva    Other malaise and fatigue    Rash and other nonspecific skin eruption    Reflux esophagitis    Thrombocytopenia, unspecified (HCC)    Type II or unspecified type diabetes mellitus without mention of complication, not stated as uncontrolled    Type II or unspecified type diabetes mellitus without mention of complication, not stated as uncontrolled    Unspecified disorder of skin and subcutaneous tissue    Unspecified essential hypertension    Unspecified essential hypertension    Past Surgical History:  Procedure Laterality Date   ABDOMINAL HYSTERECTOMY     CESAREAN SECTION     1960,1964,1971,1972   CYST EXCISION  1974   Pilonidal Cyst excision   EYE SURGERY Right 04/14/2017   Dr. Zadie Rhine   KNEE  ARTHROSCOPY Right    for meniscal tear   RETINAL TEAR REPAIR CRYOTHERAPY  10/21/2017   Getting injections in right eye   Social History   Socioeconomic History   Marital status: Married    Spouse name: Not on file   Number of children: Not on file   Years of education: Not on file   Highest education level: Not on file  Occupational History   Not on file  Tobacco Use   Smoking status: Former    Types: Cigarettes    Quit date: 12/13/1990    Years since quitting: 30.6   Smokeless tobacco: Never  Vaping Use   Vaping Use: Never used  Substance and Sexual Activity   Alcohol use: No    Alcohol/week: 0.0 standard drinks   Drug use: No   Sexual activity: Yes    Partners: Male    Comment: None   Other Topics Concern   Not on file  Social History Narrative   Not on file   Social Determinants of Health   Financial Resource Strain: Not on file  Food Insecurity: Not on file  Transportation Needs: Not on file  Physical Activity: Not on file  Stress: Not on file  Social Connections: Not on file   Family History  Problem Relation Age of Onset   Cancer Mother  brain   Stroke Father    Allergies  Allergen Reactions   Crestor [Rosuvastatin Calcium]     Muscle cramps   Latex Other (See Comments)    GLOVES   Sulfa Antibiotics    Prior to Admission medications   Medication Sig Start Date End Date Taking? Authorizing Provider  acetaminophen (TYLENOL) 325 MG tablet Take 325 mg by mouth daily as needed. For pain    [provider]  albuterol (VENTOLIN HFA) 108 (90 Base) MCG/ACT inhaler Inhale 2 puffs into the lungs every 6 (six) hours as needed for wheezing or shortness of breath. 04/04/20   Ngetich, Dinah C, NP  amLODipine (NORVASC) 5 MG tablet Take 1 tablet (5 mg total) by mouth daily. 07/21/21   Ngetich, Dinah C, NP  budesonide-formoterol (SYMBICORT) 160-4.5 MCG/ACT inhaler Inhale 2 puffs into the lungs 2 (two) times daily. PLEASE GIVE PATIENT 3 MONTH SUPPLY.     [provider]  calcium-vitamin D (OSCAL WITH D) 500-200 MG-UNIT per tablet Take 1 tablet by mouth 2 (two) times daily. 09/25/14   Blanchie Serve, MD  carvedilol (COREG) 12.5 MG tablet TAKE 1 TABLET ONCE DAILY FOR BLOOD PRESSURE 06/08/21   Ngetich, Dinah C, NP  ELDERBERRY PO Take by mouth 3 (three) times a week.     [provider]  ezetimibe (ZETIA) 10 MG tablet Take 1 tablet (10 mg total) by mouth daily. 04/21/21   Ngetich, Dinah C, NP  fexofenadine (ALLEGRA) 180 MG tablet Take 180 mg by mouth as needed.     [provider]  ketorolac (ACULAR) 0.5 % ophthalmic solution Place 1 drop into the right eye 4 (four) times daily. 03/23/21   [provider]  KETOROLAC TROMETHAMINE OP Apply 1 drop to eye 4 (four) times daily. Right eye    [provider]  losartan (COZAAR) 100 MG tablet Take 1 tablet (100 mg total) by mouth daily. 05/14/21   Ngetich, Dinah C, NP  Magnesium Oxide 500 MG CAPS Take 2 capsules by mouth at bedtime.    [provider]  methocarbamol (ROBAXIN) 500 MG tablet Take 1 tablet (500 mg total) by mouth every 8 (eight) hours as needed for muscle spasms. 06/08/21   Ngetich, Dinah C, NP  mometasone (NASONEX) 50 MCG/ACT nasal spray Place 1 spray into the nose 2 (two) times daily as needed. 03/03/20   Ngetich, Dinah C, NP  Multiple Vitamins-Minerals (ZINC PO) Take 1 tablet by mouth once a week.    [provider]  ondansetron (ZOFRAN-ODT) 4 MG disintegrating tablet Take 1 tablet (4 mg total) by mouth every 8 (eight) hours as needed for nausea or vomiting. 06/06/21   Jaynee Eagles, PA-C  phenylephrine (,USE FOR PREPARATION-H,) 0.25 % suppository Place 1 suppository rectally 2 (two) times daily. 07/13/21   Ngetich, Dinah C, NP  prednisoLONE acetate (PRED FORTE) 1 % ophthalmic suspension Place 1 drop into the right eye 4 (four) times daily.     [provider]  psyllium (REGULOID) 0.52 g capsule Take 0.52 g by mouth daily.    [provider]  simethicone (GAS-X) 80 MG chewable tablet Chew 1 tablet (80 mg total) by mouth every 6 (six) hours as needed for flatulence. 10/29/19   Ngetich, Dinah C, NP  sitaGLIPtin (JANUVIA) 25 MG tablet Take 1 tablet (25 mg total) by mouth daily. 01/27/21   Reed, Tiffany L, DO  sodium chloride (OCEAN) 0.65 % SOLN nasal spray Place 1 spray into both nostrils as needed for congestion.  04/04/20   Ngetich, Dinah C, NP     Positive ROS: Otherwise negative  All other systems have been reviewed and were otherwise negative with the exception of those mentioned in the HPI and as above.  Physical Exam: Constitutional: Alert, well-appearing, no acute distress Ears: External ears without lesions or tenderness. Ear canals are clear bilaterally with intact, clear TMs bilaterally with a clear middle ear space on microscopic exam. Nasal: External nose without lesions. Septum with mild deformity and mild rhinitis.. Clear nasal passages Oral: Lips and gums without lesions. Tongue and palate mucosa without lesions. Posterior oropharynx clear. Neck: No palpable adenopathy or masses Respiratory: Breathing comfortably  Skin: No facial/neck lesions or rash noted.  Audiologic testing in the office today demonstrated a mild bilateral symmetric sensorineural hearing loss with type A tympanograms bilaterally.  SRT's were 30 DB on the right and 35 DB on the left.  Procedures  Assessment: Intermittent clicking sound in the left ear.  Questionable etiology with normal examination. Bilateral mild sensorineural hearing loss in both ears  Plan: She would be a candidate for hearing aids if she is having difficulty with her hearing.  Concerning the clicking sound she has a normal ear canal and TM evaluation and unless this becomes more frequent did not recommend any further specific therapy concerning this. She will follow-up as needed.   Radene Journey, MD   CC:

## 2021-08-11 ENCOUNTER — Encounter (INDEPENDENT_AMBULATORY_CARE_PROVIDER_SITE_OTHER): Payer: Self-pay

## 2021-08-12 ENCOUNTER — Other Ambulatory Visit: Payer: Self-pay | Admitting: *Deleted

## 2021-08-12 MED ORDER — SITAGLIPTIN PHOSPHATE 25 MG PO TABS: 25.0000 mg | ORAL_TABLET | Freq: Every day | ORAL | 1 refills | Status: DC

## 2021-08-12 MED ORDER — SITAGLIPTIN PHOSPHATE 25 MG PO TABS: 25.0000 mg | ORAL_TABLET | Freq: Every day | ORAL | 0 refills | Status: DC

## 2021-08-12 NOTE — Telephone Encounter (Signed)
Patient requested a local supply to pharmacy until she is able to get her mail order.

## 2021-08-13 ENCOUNTER — Other Ambulatory Visit: Payer: Self-pay | Admitting: Family

## 2021-08-13 NOTE — Telephone Encounter (Signed)
Patient called and stated that [facility] [address] did not have her medication and requested it to be sent to [facility].

## 2021-09-10 ENCOUNTER — Other Ambulatory Visit: Payer: Self-pay

## 2021-09-10 DIAGNOSIS — J301 Allergic rhinitis due to pollen: Secondary | ICD-10-CM

## 2021-09-10 MED ORDER — BUDESONIDE-FORMOTEROL FUMARATE 160-4.5 MCG/ACT IN AERO: 2.0000 | INHALATION_SPRAY | Freq: Two times a day (BID) | RESPIRATORY_TRACT | 1 refills | Status: DC

## 2021-09-10 MED ORDER — MOMETASONE FUROATE 50 MCG/ACT NA SUSP: 1.0000 | Freq: Two times a day (BID) | NASAL | 3 refills | Status: DC | PRN

## 2021-09-10 NOTE — Telephone Encounter (Signed)
Patient has called and requested refill on medication "Mometasone 33mcg". Patient medication has high warning. Please review and sign patient medication. Medication pend and sent to PCP Ngetich, Nelda Bucks, NP for approval. Please Advise.

## 2021-09-18 ENCOUNTER — Other Ambulatory Visit: Payer: Self-pay | Admitting: *Deleted

## 2021-09-18 ENCOUNTER — Telehealth: Payer: Self-pay | Admitting: Family

## 2021-09-18 DIAGNOSIS — J301 Allergic rhinitis due to pollen: Secondary | ICD-10-CM

## 2021-09-18 MED ORDER — MOMETASONE FUROATE 50 MCG/ACT NA SUSP: 1.0000 | Freq: Two times a day (BID) | NASAL | 3 refills | Status: DC | PRN

## 2021-09-18 NOTE — Telephone Encounter (Signed)
Patient called and stated that she has requested supplies and it is ok to fax OV notes to them.   Faxed OV note.

## 2021-09-18 NOTE — Telephone Encounter (Signed)
[facility] never received Rx refill and requesting new Rx.   Pended Rx and sent to [facility] for approval due to [place].

## 2021-09-18 NOTE — Telephone Encounter (Signed)
Please fax visit notes for 06/26/2021 as requested for diabetic supplies.please see scan document.patient to check CBG once daily.

## 2021-09-18 NOTE — Telephone Encounter (Signed)
Received fax from [facility] [phone] Fax: [phone] Stating patient requested their Diabetic Supplies and they are requesting chart notes to meed the requirements for Medicare.   Tried calling patient, LMOM to return call regarding if she has requested this and verbal ok to send OV notes.   Awaiting callback.

## 2021-09-20 ENCOUNTER — Telehealth: Payer: Self-pay | Admitting: Family

## 2021-09-20 NOTE — Telephone Encounter (Signed)
Pharmacy reports Memetasone not covered by insurance recommend step-down medication Fluticasone.please D/C Memetasone spray and  Start on Fluticasone 50 mcg spray inhaler 2 sprays into each nostril daily # 1 with 5 refills for nasal congestion.

## 2021-09-21 MED ORDER — FLUTICASONE PROPIONATE 50 MCG/ACT NA SUSP: 2.0000 | Freq: Every day | NASAL | 1 refills | Status: DC

## 2021-09-21 NOTE — Telephone Encounter (Signed)
Patient notified and agreed. Rx sent to [facility].

## 2021-09-28 ENCOUNTER — Ambulatory Visit (INDEPENDENT_AMBULATORY_CARE_PROVIDER_SITE_OTHER): Payer: Medicare Other | Admitting: *Deleted

## 2021-09-28 ENCOUNTER — Other Ambulatory Visit: Payer: Self-pay

## 2021-09-28 DIAGNOSIS — Z23 Encounter for immunization: Secondary | ICD-10-CM | POA: Diagnosis not present

## 2021-10-09 DIAGNOSIS — Z23 Encounter for immunization: Secondary | ICD-10-CM | POA: Diagnosis not present

## 2021-11-03 ENCOUNTER — Ambulatory Visit (INDEPENDENT_AMBULATORY_CARE_PROVIDER_SITE_OTHER): Payer: Medicare Other

## 2021-11-03 ENCOUNTER — Encounter: Payer: Self-pay | Admitting: Podiatry

## 2021-11-03 ENCOUNTER — Other Ambulatory Visit: Payer: Self-pay

## 2021-11-03 ENCOUNTER — Ambulatory Visit (INDEPENDENT_AMBULATORY_CARE_PROVIDER_SITE_OTHER): Payer: Medicare Other | Admitting: Podiatry

## 2021-11-03 ENCOUNTER — Other Ambulatory Visit: Payer: Self-pay | Admitting: Podiatry

## 2021-11-03 DIAGNOSIS — M7662 Achilles tendinitis, left leg: Secondary | ICD-10-CM

## 2021-11-03 MED ORDER — MELOXICAM 15 MG PO TABS: 15.0000 mg | ORAL_TABLET | Freq: Every day | ORAL | 0 refills | Status: DC

## 2021-11-03 NOTE — Progress Notes (Signed)
See Dr. Heber Bergoo note

## 2021-11-04 ENCOUNTER — Encounter: Payer: Self-pay | Admitting: Podiatry

## 2021-11-04 ENCOUNTER — Telehealth: Payer: Self-pay

## 2021-11-04 MED ORDER — EZETIMIBE 10 MG PO TABS: 10.0000 mg | ORAL_TABLET | Freq: Every day | ORAL | 1 refills | Status: DC

## 2021-11-04 NOTE — Progress Notes (Signed)
  Subjective:  Patient ID: Tanya Bell, female    DOB: 07/28/1941,   MRN: [id number]  Chief Complaint  Patient presents with   Nail Problem    Diabetic foot care    80 y.o. female presents for diabetic foot care with Dr. Elisha Ponder. Durring apointment patient expressed severe pain in the back of her left heel. Dr. Elisha Ponder asked me to come evaluate. Patient relates the pain started several days ago and has been sever pain at the back of the heel. Denies any injuries. Denies any treatment. States it is most painful walking and in certain shoes . Denies any other pedal complaints. Denies n/v/f/c.   Past Medical History:  Diagnosis Date   Acute upper respiratory infections of unspecified site    Acute upper respiratory infections of unspecified site    Acute upper respiratory infections of unspecified site    Allergic rhinitis due to pollen    Candidiasis of mouth    Disorder of bone and cartilage, unspecified    Diverticulitis of colon (without mention of hemorrhage)(562.11)    Essential hypertension, benign    Essential hypertension, benign    Extrinsic asthma, unspecified    Herpes simplex with other ophthalmic complications    Other abnormal glucose    Other and unspecified hyperlipidemia    Other and unspecified hyperlipidemia    Other and unspecified hyperlipidemia    Other cataract    Other cataract    Other dystrophy of vulva    Other malaise and fatigue    Rash and other nonspecific skin eruption    Reflux esophagitis    Thrombocytopenia, unspecified (HCC)    Type II or unspecified type diabetes mellitus without mention of complication, not stated as uncontrolled    Type II or unspecified type diabetes mellitus without mention of complication, not stated as uncontrolled    Unspecified disorder of skin and subcutaneous tissue    Unspecified essential hypertension    Unspecified essential hypertension     Objective:  Physical Exam: Vascular: DP/PT pulses 2/4  bilateral. CFT <3 seconds. Normal hair growth on digits. No edema.  Skin. No lacerations or abrasions bilateral feet.  Musculoskeletal: MMT 5/5 bilateral lower extremities in DF, PF, Inversion and Eversion. Deceased ROM in DF of ankle joint. Tender to insertion of achilles tendon. No tenderness proximally along the tendon. Tendon intact. Pain with DF of the ankle no pain with active PF.  Neurological: Sensation intact to light touch.   Assessment:   1. Achilles tendinitis of left lower extremity      Plan:  Patient was evaluated and treated and all questions answered. -Xrays reviewed -Discussed Achilles insertional tendonitis and treatment options with patient.  -Discussed stretching exercises. -Rx Meloxicam provided  -Heel lifts provided and CAM boot dispensed. .  -Discussed if no improvement will consider MRI/PT/EPAT/PRP injections.  -Patient to return to office a in 4 weeks for re-evaluation.    Lorenda Peck, DPM

## 2021-11-04 NOTE — Telephone Encounter (Signed)
Patient request refill on medication 

## 2021-11-04 NOTE — Patient Instructions (Signed)

## 2021-11-27 ENCOUNTER — Other Ambulatory Visit: Payer: Self-pay

## 2021-11-27 MED ORDER — LOSARTAN POTASSIUM 100 MG PO TABS: 100.0000 mg | ORAL_TABLET | Freq: Every day | ORAL | 1 refills | Status: DC

## 2021-12-01 ENCOUNTER — Ambulatory Visit (INDEPENDENT_AMBULATORY_CARE_PROVIDER_SITE_OTHER): Payer: Medicare Other | Admitting: Podiatry

## 2021-12-01 ENCOUNTER — Encounter: Payer: Self-pay | Admitting: Podiatry

## 2021-12-01 ENCOUNTER — Other Ambulatory Visit: Payer: Self-pay

## 2021-12-01 DIAGNOSIS — M7662 Achilles tendinitis, left leg: Secondary | ICD-10-CM | POA: Diagnosis not present

## 2021-12-01 DIAGNOSIS — E119 Type 2 diabetes mellitus without complications: Secondary | ICD-10-CM

## 2021-12-01 NOTE — Progress Notes (Signed)
°  Subjective:  Patient ID: Tanya Bell, female    DOB: 1941-01-05,   MRN: [id number]  Chief Complaint  Patient presents with   Foot Pain    4 wk Tendinitis     80 y.o. female presents for follow up of left achilles tendonitis.  Relates she is doing better since the last visit. Relates occasional pain but overall doing much better. Has some swelling on the outside of the ankle she is concerned about. Has been taking meloxicam which helped.  . Denies any other pedal complaints. Denies n/v/f/c.   Past Medical History:  Diagnosis Date   Acute upper respiratory infections of unspecified site    Acute upper respiratory infections of unspecified site    Acute upper respiratory infections of unspecified site    Allergic rhinitis due to pollen    Candidiasis of mouth    Disorder of bone and cartilage, unspecified    Diverticulitis of colon (without mention of hemorrhage)(562.11)    Essential hypertension, benign    Essential hypertension, benign    Extrinsic asthma, unspecified    Herpes simplex with other ophthalmic complications    Other abnormal glucose    Other and unspecified hyperlipidemia    Other and unspecified hyperlipidemia    Other and unspecified hyperlipidemia    Other cataract    Other cataract    Other dystrophy of vulva    Other malaise and fatigue    Rash and other nonspecific skin eruption    Reflux esophagitis    Thrombocytopenia, unspecified (HCC)    Type II or unspecified type diabetes mellitus without mention of complication, not stated as uncontrolled    Type II or unspecified type diabetes mellitus without mention of complication, not stated as uncontrolled    Unspecified disorder of skin and subcutaneous tissue    Unspecified essential hypertension    Unspecified essential hypertension     Objective:  Physical Exam: Vascular: DP/PT pulses 2/4 bilateral. CFT <3 seconds. Normal hair growth on digits. No edema.  Skin. No lacerations or abrasions  bilateral feet.  Musculoskeletal: MMT 5/5 bilateral lower extremities in DF, PF, Inversion and Eversion. Deceased ROM in DF of ankle joint. Minimally tender to insertion of achilles tendon. No tenderness proximally along the tendon. Tendon intact. Pain with DF of the ankle no pain with active PF.  Neurological: Sensation intact to light touch.   Assessment:   1. Achilles tendinitis of left lower extremity   2. Controlled type 2 diabetes mellitus without complication, without long-term current use of insulin ([place])       Plan:  Patient was evaluated and treated and all questions answered. -Xrays reviewed -Discussed Achilles insertional tendonitis and treatment options with patient.  -Continue stretching.  -Meloxicam as needed.  -May transition out of CAM boot into regular shoes using heel lifts.  -Discussed if no improvement will consider MRI/PT/EPAT/PRP injections.  -Patient to return as needed.     Lorenda Peck, DPM

## 2021-12-29 ENCOUNTER — Other Ambulatory Visit: Payer: Self-pay

## 2021-12-29 ENCOUNTER — Other Ambulatory Visit: Payer: Medicare Other

## 2021-12-29 DIAGNOSIS — I10 Essential (primary) hypertension: Secondary | ICD-10-CM

## 2021-12-29 DIAGNOSIS — E785 Hyperlipidemia, unspecified: Secondary | ICD-10-CM

## 2021-12-29 DIAGNOSIS — E1142 Type 2 diabetes mellitus with diabetic polyneuropathy: Secondary | ICD-10-CM

## 2021-12-30 LAB — CBC WITH DIFFERENTIAL/PLATELET
Absolute Monocytes: 564 cells/uL (ref 200–950)
Basophils Absolute: 31 cells/uL (ref 0–200)
Basophils Relative: 0.5 %
Eosinophils Absolute: 242 cells/uL (ref 15–500)
Eosinophils Relative: 3.9 %
HCT: 41.7 % (ref 35.0–45.0)
Hemoglobin: 13.7 g/dL (ref 11.7–15.5)
Lymphs Abs: 3112 cells/uL (ref 850–3900)
MCH: 25.8 pg — ABNORMAL LOW (ref 27.0–33.0)
MCHC: 32.9 g/dL (ref 32.0–36.0)
MCV: 78.7 fL — ABNORMAL LOW (ref 80.0–100.0)
MPV: 11.1 fL (ref 7.5–12.5)
Monocytes Relative: 9.1 %
Neutro Abs: 2251 cells/uL (ref 1500–7800)
Neutrophils Relative %: 36.3 %
Platelets: 164 10*3/uL (ref 140–400)
RBC: 5.3 10*6/uL — ABNORMAL HIGH (ref 3.80–5.10)
RDW: 16.4 % — ABNORMAL HIGH (ref 11.0–15.0)
Total Lymphocyte: 50.2 %
WBC: 6.2 10*3/uL (ref 3.8–10.8)

## 2021-12-30 LAB — LIPID PANEL
Cholesterol: 178 mg/dL (ref <200)
HDL: 62 mg/dL (ref >=50)
LDL Cholesterol (Calc): 101 mg/dL (calc) — ABNORMAL HIGH
Non-HDL Cholesterol (Calc): 116 mg/dL (calc) (ref <130)
Total CHOL/HDL Ratio: 2.9 (calc) (ref <5.0)
Triglycerides: 63 mg/dL (ref <150)

## 2021-12-30 LAB — COMPLETE METABOLIC PANEL WITH GFR
AG Ratio: 1.2 (calc) (ref 1.0–2.5)
ALT: 14 U/L (ref 6–29)
AST: 14 U/L (ref 10–35)
Albumin: 4.1 g/dL (ref 3.6–5.1)
Alkaline phosphatase (APISO): 71 U/L (ref 37–153)
BUN: 13 mg/dL (ref 7–25)
CO2: 29 mmol/L (ref 20–32)
Calcium: 9.3 mg/dL (ref 8.6–10.4)
Chloride: 105 mmol/L (ref 98–110)
Creat: 0.69 mg/dL (ref 0.60–1.00)
Globulin: 3.5 g/dL (calc) (ref 1.9–3.7)
Glucose, Bld: 120 mg/dL — ABNORMAL HIGH (ref 65–99)
Potassium: 3.9 mmol/L (ref 3.5–5.3)
Sodium: 139 mmol/L (ref 135–146)
Total Bilirubin: 0.4 mg/dL (ref 0.2–1.2)
Total Protein: 7.6 g/dL (ref 6.1–8.1)
eGFR: 88 mL/min/{1.73_m2} (ref >=60)

## 2021-12-30 LAB — HEMOGLOBIN A1C
Hgb A1c MFr Bld: 6.5 % of total Hgb — ABNORMAL HIGH (ref <5.7)
Mean Plasma Glucose: 140 mg/dL
eAG (mmol/L): 7.7 mmol/L

## 2021-12-30 LAB — TSH: TSH: 2.13 mIU/L (ref 0.40–4.50)

## 2022-01-01 ENCOUNTER — Ambulatory Visit (INDEPENDENT_AMBULATORY_CARE_PROVIDER_SITE_OTHER): Payer: Medicare Other | Admitting: Family

## 2022-01-01 ENCOUNTER — Encounter: Payer: Self-pay | Admitting: Family

## 2022-01-01 ENCOUNTER — Other Ambulatory Visit: Payer: Self-pay

## 2022-01-01 VITALS — BP 138/84 | HR 72 | Temp 97.4°F | Resp 16 | Ht 61.0 in | Wt 164.6 lb

## 2022-01-01 DIAGNOSIS — E785 Hyperlipidemia, unspecified: Secondary | ICD-10-CM | POA: Diagnosis not present

## 2022-01-01 DIAGNOSIS — K219 Gastro-esophageal reflux disease without esophagitis: Secondary | ICD-10-CM

## 2022-01-01 DIAGNOSIS — E1142 Type 2 diabetes mellitus with diabetic polyneuropathy: Secondary | ICD-10-CM | POA: Diagnosis not present

## 2022-01-01 DIAGNOSIS — I10 Essential (primary) hypertension: Secondary | ICD-10-CM

## 2022-01-01 NOTE — Progress Notes (Signed)
Provider: Marlowe Sax FNP-C   Dequincy Born, Nelda Bucks, NP  Patient Care Team: Cashton Hosley, Nelda Bucks, NP as PCP - General (Family Medicine) Teena Irani, MD (Inactive) as Consulting Physician (Gastroenterology) Warden Fillers, MD as Consulting Physician (Ophthalmology) [name], MD as Consulting Physician (Ophthalmology) Mickel Fuchs, MD as Referring Physician (Psychiatry) Velora Heckler (Inactive) (Orthotics)  Extended Emergency Contact Information Primary Emergency Contact: Vilas Phone: [phone] Relation: None  Code Status:  Full Code  Goals of care: Advanced Directive information Advanced Directives 01/01/2022  Does Patient Have a Medical Advance Directive? No  Type of Advance Directive -  Does patient want to make changes to medical advance directive? -  Would patient like information on creating a medical advance directive? No - Patient declined     Chief Complaint  Patient presents with   Medical Management of Chronic Issues    6 month follow up   Health Maintenance    Discuss the need for eye exam.   Immunizations    Discuss the need for Shingrix vaccine, and Covid Booster.    HPI:  Pt is a 81 y.o. female seen today for 6 months follow up for medical management of chronic diseases. Has a medical history of Hypertension,hyperlipidemia, Asthma,seasonal allergic rhinitis,Type 2 DM,GERD,Eczema,Osteopenia among others. She denies any acute issues.   Has had her first shingles vaccine due for the second dose   Also due for 6 th COVID-19 vaccine   Hgb A 1 C 6.5 previous 6.2 glucose 120 she denies any signs of hypoglycemia.  LDL 101 previous 83 states eating out more.Has not been exercising much due to caring for the husband.Has been snacking on peanut butter.    Past Medical History:  Diagnosis Date   Acute upper respiratory infections of unspecified site    Acute upper respiratory infections of unspecified site    Acute upper respiratory infections  of unspecified site    Allergic rhinitis due to pollen    Candidiasis of mouth    Disorder of bone and cartilage, unspecified    Diverticulitis of colon (without mention of hemorrhage)(562.11)    Essential hypertension, benign    Essential hypertension, benign    Extrinsic asthma, unspecified    Herpes simplex with other ophthalmic complications    Other abnormal glucose    Other and unspecified hyperlipidemia    Other and unspecified hyperlipidemia    Other and unspecified hyperlipidemia    Other cataract    Other cataract    Other dystrophy of vulva    Other malaise and fatigue    Rash and other nonspecific skin eruption    Reflux esophagitis    Thrombocytopenia, unspecified (HCC)    Type II or unspecified type diabetes mellitus without mention of complication, not stated as uncontrolled    Type II or unspecified type diabetes mellitus without mention of complication, not stated as uncontrolled    Unspecified disorder of skin and subcutaneous tissue    Unspecified essential hypertension    Unspecified essential hypertension    Past Surgical History:  Procedure Laterality Date   ABDOMINAL HYSTERECTOMY     CESAREAN SECTION     [phone]   CYST EXCISION  1974   Pilonidal Cyst excision   EYE SURGERY Right 04/14/2017   Dr. Zadie Rhine   KNEE ARTHROSCOPY Right    for meniscal tear   RETINAL TEAR REPAIR CRYOTHERAPY  10/21/2017   Getting injections in right eye    Allergies  Allergen Reactions   Crestor [Rosuvastatin Calcium]  Muscle cramps   Latex Other (See Comments)    GLOVES   Sulfa Antibiotics     Allergies as of 01/01/2022       Reactions   Crestor [rosuvastatin Calcium]    Muscle cramps   Latex Other (See Comments)   GLOVES   Sulfa Antibiotics         Medication List        Accurate as of January 01, 2022  9:49 AM. If you have any questions, ask your nurse or doctor.          acetaminophen 325 MG tablet Commonly known as: TYLENOL Take  325 mg by mouth daily as needed. For pain   albuterol 108 (90 Base) MCG/ACT inhaler Commonly known as: Ventolin HFA Inhale 2 puffs into the lungs every 6 (six) hours as needed for wheezing or shortness of breath.   amLODipine 5 MG tablet Commonly known as: NORVASC Take 1 tablet (5 mg total) by mouth daily.   budesonide-formoterol 160-4.5 MCG/ACT inhaler Commonly known as: SYMBICORT Inhale 2 puffs into the lungs 2 (two) times daily. PLEASE GIVE PATIENT 3 MONTH SUPPLY.   calcium-vitamin D 500-200 MG-UNIT tablet Commonly known as: OSCAL WITH D Take 1 tablet by mouth 2 (two) times daily.   carvedilol 12.5 MG tablet Commonly known as: COREG TAKE 1 TABLET ONCE DAILY FOR BLOOD PRESSURE   ELDERBERRY PO Take by mouth 3 (three) times a week.   ezetimibe 10 MG tablet Commonly known as: ZETIA Take 1 tablet (10 mg total) by mouth daily.   fexofenadine 180 MG tablet Commonly known as: ALLEGRA Take 180 mg by mouth as needed.   fluticasone 50 MCG/ACT nasal spray Commonly known as: FLONASE Place 2 sprays into both nostrils daily.   Januvia 25 MG tablet Generic drug: sitaGLIPtin TAKE 1 TABLET(25 MG) BY MOUTH DAILY   KETOROLAC TROMETHAMINE OP Apply 1 drop to eye 4 (four) times daily. Right eye   ketorolac 0.5 % ophthalmic solution Commonly known as: ACULAR Place 1 drop into the right eye 4 (four) times daily.   losartan 100 MG tablet Commonly known as: Cozaar Take 1 tablet (100 mg total) by mouth daily.   Magnesium Oxide 500 MG Caps Take 2 capsules by mouth at bedtime.   meloxicam 15 MG tablet Commonly known as: MOBIC Take 1 tablet (15 mg total) by mouth daily.   methocarbamol 500 MG tablet Commonly known as: Robaxin Take 1 tablet (500 mg total) by mouth every 8 (eight) hours as needed for muscle spasms.   ondansetron 4 MG disintegrating tablet Commonly known as: ZOFRAN-ODT Take 1 tablet (4 mg total) by mouth every 8 (eight) hours as needed for nausea or vomiting.    phenylephrine 0.25 % suppository Commonly known as: (USE for PREPARATION-H) Place 1 suppository rectally 2 (two) times daily.   prednisoLONE acetate 1 % ophthalmic suspension Commonly known as: PRED FORTE Place 1 drop into the right eye 4 (four) times daily.   psyllium 0.52 g capsule Commonly known as: REGULOID Take 0.52 g by mouth daily.   simethicone 80 MG chewable tablet Commonly known as: Gas-X Chew 1 tablet (80 mg total) by mouth every 6 (six) hours as needed for flatulence.   sodium chloride 0.65 % Soln nasal spray Commonly known as: OCEAN Place 1 spray into both nostrils as needed for congestion.   ZINC PO Take 1 tablet by mouth once a week.        Review of Systems  Constitutional:  Negative for  appetite change, chills, fatigue, fever and unexpected weight change.  HENT:  Positive for congestion. Negative for dental problem, ear discharge, ear pain, facial swelling, hearing loss, nosebleeds, postnasal drip, rhinorrhea, sinus pressure, sinus pain, sneezing, sore throat, tinnitus and trouble swallowing.   Eyes:  Negative for pain, discharge, redness, itching and visual disturbance.  Respiratory:  Negative for cough, chest tightness, shortness of breath and wheezing.   Cardiovascular:  Negative for chest pain, palpitations and leg swelling.  Gastrointestinal:  Negative for abdominal distention, abdominal pain, blood in stool, constipation, diarrhea, nausea and vomiting.  Endocrine: Negative for cold intolerance, heat intolerance, polydipsia, polyphagia and polyuria.  Genitourinary:  Negative for difficulty urinating, dysuria, flank pain, frequency and urgency.  Musculoskeletal:  Negative for arthralgias, back pain, gait problem, joint swelling, myalgias, neck pain and neck stiffness.  Skin:  Negative for color change, pallor, rash and wound.  Neurological:  Negative for dizziness, syncope, speech difficulty, weakness, light-headedness, numbness and headaches.   Hematological:  Does not bruise/bleed easily.  Psychiatric/Behavioral:  Negative for agitation, behavioral problems, confusion, hallucinations, self-injury, sleep disturbance and suicidal ideas. The patient is not nervous/anxious.    Immunization History  Administered Date(s) Administered   Fluad Quad(high Dose 65+) 08/29/2019, 09/02/2020, 09/28/2021   Influenza Split 08/25/2010, 08/26/2011, 10/04/2012   Influenza Whole 09/24/2009   Influenza, High Dose Seasonal PF 08/16/2018   Influenza,inj,Quad PF,6+ Mos 09/11/2013, 09/04/2014, 11/12/2015, 10/04/2016, 09/06/2017   Influenza-Unspecified 09/02/2018   PFIZER(Purple Top)SARS-COV-2 Vaccination 02/11/2020, 03/03/2020, 09/17/2020, 07/04/2021, 10/09/2021   Pneumococcal Conjugate-13 02/17/2012   Pneumococcal Polysaccharide-23 05/19/2016   Tdap 02/17/2012   Zoster Recombinat (Shingrix) 10/15/2021   Pertinent  Health Maintenance Due  Topic Date Due   HEMOGLOBIN A1C  06/28/2022   OPHTHALMOLOGY EXAM  10/13/2022   FOOT EXAM  11/03/2022   INFLUENZA VACCINE  Completed   DEXA SCAN  Completed   Fall Risk 01/12/2021 06/06/2021 06/08/2021 06/26/2021 01/01/2022  Falls in the past year? 0 - 0 0 0  Was there an injury with Fall? 0 - 0 0 0  Fall Risk Category Calculator 0 - 0 0 0  Fall Risk Category Low - Low Low Low  Patient Fall Risk Level Low fall risk Low fall risk - Low fall risk Low fall risk  Patient at Risk for Falls Due to - - No Fall Risks No Fall Risks No Fall Risks  Fall risk Follow up - - Falls evaluation completed Falls evaluation completed Falls evaluation completed   Functional Status Survey:    Vitals:   01/01/22 0946  BP: 138/84  Pulse: 72  Resp: 16  Temp: (!) 97.4 F (36.3 C)  SpO2: 94%  Weight: 167 lb 3.2 oz (75.8 kg)  Height: 5' 1"  (1.549 m)   Body mass index is 31.59 kg/m. Physical Exam Vitals reviewed.  Constitutional:      General: She is not in acute distress.    Appearance: Normal appearance. She is normal  weight. She is not ill-appearing or diaphoretic.  HENT:     Head: Normocephalic.     Right Ear: Tympanic membrane, ear canal and external ear normal. There is no impacted cerumen.     Left Ear: Tympanic membrane, ear canal and external ear normal. There is no impacted cerumen.     Nose: Nose normal. No congestion or rhinorrhea.     Mouth/Throat:     Mouth: Mucous membranes are moist.     Pharynx: Oropharynx is clear. No oropharyngeal exudate or posterior oropharyngeal erythema.  Eyes:  General: No scleral icterus.       Right eye: No discharge.        Left eye: No discharge.     Extraocular Movements: Extraocular movements intact.     Conjunctiva/sclera: Conjunctivae normal.     Pupils: Pupils are equal, round, and reactive to light.  Neck:     Vascular: No carotid bruit.  Cardiovascular:     Rate and Rhythm: Normal rate and regular rhythm.     Pulses: Normal pulses.     Heart sounds: Normal heart sounds. No murmur heard.   No friction rub. No gallop.  Pulmonary:     Effort: Pulmonary effort is normal. No respiratory distress.     Breath sounds: Normal breath sounds. No wheezing, rhonchi or rales.  Chest:     Chest wall: No tenderness.  Abdominal:     General: Bowel sounds are normal. There is no distension.     Palpations: Abdomen is soft. There is no mass.     Tenderness: There is no abdominal tenderness. There is no right CVA tenderness, left CVA tenderness, guarding or rebound.  Musculoskeletal:        General: No swelling or tenderness. Normal range of motion.     Cervical back: Normal range of motion. No rigidity or tenderness.     Right lower leg: No edema.     Left lower leg: No edema.  Lymphadenopathy:     Cervical: No cervical adenopathy.  Skin:    General: Skin is warm and dry.     Coloration: Skin is not pale.     Findings: No bruising, erythema, lesion or rash.  Neurological:     Mental Status: She is alert and oriented to person, place, and time.      Cranial Nerves: No cranial nerve deficit.     Sensory: No sensory deficit.     Motor: No weakness.     Coordination: Coordination normal.     Gait: Gait normal.  Psychiatric:        Mood and Affect: Mood normal.        Speech: Speech normal.        Behavior: Behavior normal.        Thought Content: Thought content normal.        Judgment: Judgment normal.    Labs reviewed: Recent Labs    06/08/21 1406 06/24/21 0000 12/29/21 0854  NA 140 140 139  K 3.9 3.9 3.9  CL 105 106 105  CO2 26 24 29   GLUCOSE 65 119* 120*  BUN 13 15 13   CREATININE 0.62 0.69 0.69  CALCIUM 9.5 9.2 9.3   Recent Labs    06/08/21 1406 06/24/21 0000 12/29/21 0854  AST 15 12 14   ALT 15 12 14   BILITOT 0.4 0.6 0.4  PROT 7.4 7.2 7.6   Recent Labs    06/08/21 1406 06/24/21 0000 12/29/21 0854  WBC 6.7 6.7 6.2  NEUTROABS 2,915 2,868 2,251  HGB 13.9 13.7 13.7  HCT 41.8 42.2 41.7  MCV 78.3* 78.7* 78.7*  PLT 131* 167 164   Lab Results  Component Value Date   TSH 2.13 12/29/2021   Lab Results  Component Value Date   HGBA1C 6.5 (H) 12/29/2021   Lab Results  Component Value Date   CHOL 178 12/29/2021   HDL 62 12/29/2021   LDLCALC 101 (H) 12/29/2021   TRIG 63 12/29/2021   CHOLHDL 2.9 12/29/2021    Significant Diagnostic Results in last 30 days:  No results found.  Assessment/Plan 1. Essential hypertension B/p at goal  Continue on Amlodipine,losartan and Coreg   - CBC with Differential/Platelet; Future - COMPLETE METABOLIC PANEL WITH GFR; Future  2. Controlled type 2 diabetes mellitus with diabetic polyneuropathy, without long-term current use of insulin (HCC) Lab Results  Component Value Date   HGBA1C 6.5 (H) 12/29/2021  Controlled  Continue on sitagliptin and Psyllium  Not on Statin due to intolerance - continue on ARB - CBC with Differential/Platelet; Future - COMPLETE METABOLIC PANEL WITH GFR; Future - Hemoglobin A1c; Future  3. Hyperlipidemia LDL goal <100 LDL not at goal   - continue on Zetia Intolerant to Statin  - Lipid panel; Future  4. Gastroesophageal reflux disease without esophagitis Symptoms controlled. H/H stable.No tarry or black stool  - advised to avoid eating meals late in the evening and to avoid aggravating foods and spices.  - CBC with Differential/Platelet; Future  Family/ staff Communication: Reviewed plan of care with patient verbalized understanding   Labs/tests ordered:  - CBC with Differential/Platelet - CMP with eGFR([facility]) - TSH - Hgb A1C - Lipid panel  Next Appointment : 6 months for medical management of chronic issues.Fasting Labs prior to visit.   Sandrea Hughs, NP

## 2022-01-14 ENCOUNTER — Encounter: Payer: Self-pay | Admitting: Family

## 2022-01-14 ENCOUNTER — Ambulatory Visit (INDEPENDENT_AMBULATORY_CARE_PROVIDER_SITE_OTHER): Payer: Medicare Other | Admitting: Family

## 2022-01-14 ENCOUNTER — Other Ambulatory Visit: Payer: Self-pay

## 2022-01-14 DIAGNOSIS — Z0000 Encounter for general adult medical examination without abnormal findings: Secondary | ICD-10-CM

## 2022-01-14 NOTE — Patient Instructions (Signed)
Tanya Bell , Thank you for taking time to come for your Medicare Wellness Visit. I appreciate your ongoing commitment to your health goals. Please review the following plan we discussed and let me know if I can assist you in the future.   Screening recommendations/referrals: Colonoscopy : N/A  Mammogram N/A  Bone Density : Up to date  Recommended yearly ophthalmology/optometry visit for glaucoma screening and checkup Recommended yearly dental visit for hygiene and checkup  Vaccinations: Influenza vaccine :Up to date  Pneumococcal vaccine: Up to date  Tdap vaccine : Up to date  Shingles vaccine : Please get 2 nd dose of shingles vaccine at your pharmacy    Advanced directives: No   Conditions/risks identified: Advance age > 48 yrs,hx of smoking,Hypertension ,Type 2 Diabetes ,BMI > 30   Next appointment: 1 year    Preventive Care 70 Years and Older, Female Preventive care refers to lifestyle choices and visits with your health care provider that can promote health and wellness. What does preventive care include? A yearly physical exam. This is also called an annual well check. Dental exams once or twice a year. Routine eye exams. Ask your health care provider how often you should have your eyes checked. Personal lifestyle choices, including: Daily care of your teeth and gums. Regular physical activity. Eating a healthy diet. Avoiding tobacco and drug use. Limiting alcohol use. Practicing safe sex. Taking low-dose aspirin every day. Taking vitamin and mineral supplements as recommended by your health care provider. What happens during an annual well check? The services and screenings done by your health care provider during your annual well check will depend on your age, overall health, lifestyle risk factors, and family history of disease. Counseling  Your health care provider may ask you questions about your: Alcohol use. Tobacco use. Drug use. Emotional  well-being. Home and relationship well-being. Sexual activity. Eating habits. History of falls. Memory and ability to understand (cognition). Work and work Statistician. Reproductive health. Screening  You may have the following tests or measurements: Height, weight, and BMI. Blood pressure. Lipid and cholesterol levels. These may be checked every 5 years, or more frequently if you are over 39 years old. Skin check. Lung cancer screening. You may have this screening every year starting at age 2 if you have a 30-pack-year history of smoking and currently smoke or have quit within the past 15 years. Fecal occult blood test (FOBT) of the stool. You may have this test every year starting at age 13. Flexible sigmoidoscopy or colonoscopy. You may have a sigmoidoscopy every 5 years or a colonoscopy every 10 years starting at age 59. Hepatitis C blood test. Hepatitis B blood test. Sexually transmitted disease (STD) testing. Diabetes screening. This is done by checking your blood sugar (glucose) after you have not eaten for a while (fasting). You may have this done every 1-3 years. Bone density scan. This is done to screen for osteoporosis. You may have this done starting at age 44. Mammogram. This may be done every 1-2 years. Talk to your health care provider about how often you should have regular mammograms. Talk with your health care provider about your test results, treatment options, and if necessary, the need for more tests. Vaccines  Your health care provider may recommend certain vaccines, such as: Influenza vaccine. This is recommended every year. Tetanus, diphtheria, and acellular pertussis (Tdap, Td) vaccine. You may need a Td booster every 10 years. Zoster vaccine. You may need this after age 25. Pneumococcal 13-valent  conjugate (PCV13) vaccine. One dose is recommended after age 85. Pneumococcal polysaccharide (PPSV23) vaccine. One dose is recommended after age 33. Talk to your  health care provider about which screenings and vaccines you need and how often you need them. This information is not intended to replace advice given to you by your health care provider. Make sure you discuss any questions you have with your health care provider. Document Released: 12/26/2015 Document Revised: 08/18/2016 Document Reviewed: 09/30/2015 Elsevier Interactive Patient Education  2017 [employer] Prevention in the Home Falls can cause injuries. They can happen to people of all ages. There are many things you can do to make your home safe and to help prevent falls. What can I do on the outside of my home? Regularly fix the edges of walkways and driveways and fix any cracks. Remove anything that might make you trip as you walk through a door, such as a raised step or threshold. Trim any bushes or trees on the path to your home. Use bright outdoor lighting. Clear any walking paths of anything that might make someone trip, such as rocks or tools. Regularly check to see if handrails are loose or broken. Make sure that both sides of any steps have handrails. Any raised decks and porches should have guardrails on the edges. Have any leaves, snow, or ice cleared regularly. Use sand or salt on walking paths during winter. Clean up any spills in your garage right away. This includes oil or grease spills. What can I do in the bathroom? Use night lights. Install grab bars by the toilet and in the tub and shower. Do not use towel bars as grab bars. Use non-skid mats or decals in the tub or shower. If you need to sit down in the shower, use a plastic, non-slip stool. Keep the floor dry. Clean up any water that spills on the floor as soon as it happens. Remove soap buildup in the tub or shower regularly. Attach bath mats securely with double-sided non-slip rug tape. Do not have throw rugs and other things on the floor that can make you trip. What can I do in the bedroom? Use night  lights. Make sure that you have a light by your bed that is easy to reach. Do not use any sheets or blankets that are too big for your bed. They should not hang down onto the floor. Have a firm chair that has side arms. You can use this for support while you get dressed. Do not have throw rugs and other things on the floor that can make you trip. What can I do in the kitchen? Clean up any spills right away. Avoid walking on wet floors. Keep items that you use a lot in easy-to-reach places. If you need to reach something above you, use a strong step stool that has a grab bar. Keep electrical cords out of the way. Do not use floor polish or wax that makes floors slippery. If you must use wax, use non-skid floor wax. Do not have throw rugs and other things on the floor that can make you trip. What can I do with my stairs? Do not leave any items on the stairs. Make sure that there are handrails on both sides of the stairs and use them. Fix handrails that are broken or loose. Make sure that handrails are as long as the stairways. Check any carpeting to make sure that it is firmly attached to the stairs. Fix any carpet that  is loose or worn. Avoid having throw rugs at the top or bottom of the stairs. If you do have throw rugs, attach them to the floor with carpet tape. Make sure that you have a light switch at the top of the stairs and the bottom of the stairs. If you do not have them, ask someone to add them for you. What else can I do to help prevent falls? Wear shoes that: Do not have high heels. Have rubber bottoms. Are comfortable and fit you well. Are closed at the toe. Do not wear sandals. If you use a stepladder: Make sure that it is fully opened. Do not climb a closed stepladder. Make sure that both sides of the stepladder are locked into place. Ask someone to hold it for you, if possible. Clearly mark and make sure that you can see: Any grab bars or handrails. First and last  steps. Where the edge of each step is. Use tools that help you move around (mobility aids) if they are needed. These include: Canes. Walkers. Scooters. Crutches. Turn on the lights when you go into a dark area. Replace any light bulbs as soon as they burn out. Set up your furniture so you have a clear path. Avoid moving your furniture around. If any of your floors are uneven, fix them. If there are any pets around you, be aware of where they are. Review your medicines with your doctor. Some medicines can make you feel dizzy. This can increase your chance of falling. Ask your doctor what other things that you can do to help prevent falls. This information is not intended to replace advice given to you by your health care provider. Make sure you discuss any questions you have with your health care provider. Document Released: 09/25/2009 Document Revised: 05/06/2016 Document Reviewed: 01/03/2015 Elsevier Interactive Patient Education  2017 [employer].

## 2022-01-14 NOTE — Progress Notes (Signed)
This service is provided via telemedicine  No vital signs collected/recorded due to the encounter was a telemedicine visit.   Location of patient (ex: home, work):  Home.  Patient consents to a telephone visit:  Yes  Location of the provider (ex: office, home):  [employer].  Name of any referring provider:  Jonte Bell, Tanya Bucks, NP   Names of all persons participating in the telemedicine service and their role in the encounter:  Patient, Tanya Bell, [place], [place], [name], NP.    Time spent on call: 8 minutes spent on the phone with Medical Assistant.     Subjective:   Tanya Bell is a 81 y.o. female who presents for Medicare Annual (Subsequent) preventive examination.  Review of Systems     Cardiac Risk Factors include: smoking/ tobacco exposure;hypertension;obesity (BMI >30kg/m2);advanced age (>13men, >30 women);diabetes mellitus     Objective:    Today's Vitals   01/14/22 0935  PainSc: 4    There is no height or weight on file to calculate BMI.  Advanced Directives 01/14/2022 01/01/2022 06/26/2021 06/08/2021 01/12/2021 12/31/2020 12/26/2020  Does Patient Have a Medical Advance Directive? No No No No No No No  Type of Advance Directive - - - - - - -  Does patient want to make changes to medical advance directive? - - - - - - -  Would patient like information on creating a medical advance directive? No - Patient declined No - Patient declined No - Patient declined No - Patient declined No - Patient declined No - Patient declined No - Patient declined    Current Medications (verified) Outpatient Encounter Medications as of 01/14/2022  Medication Sig   acetaminophen (TYLENOL) 325 MG tablet Take 325 mg by mouth daily as needed. For pain   albuterol (VENTOLIN HFA) 108 (90 Base) MCG/ACT inhaler Inhale 2 puffs into the lungs every 6 (six) hours as needed for wheezing or shortness of breath.   amLODipine (NORVASC) 5 MG tablet Take 1 tablet (5 mg total) by mouth  daily.   budesonide-formoterol (SYMBICORT) 160-4.5 MCG/ACT inhaler Inhale 2 puffs into the lungs 2 (two) times daily. PLEASE GIVE PATIENT 3 MONTH SUPPLY. (Patient taking differently: Inhale 2 puffs into the lungs as needed. PLEASE GIVE PATIENT 3 MONTH SUPPLY.)   calcium-vitamin D (OSCAL WITH D) 500-200 MG-UNIT per tablet Take 1 tablet by mouth 2 (two) times daily.   carvedilol (COREG) 12.5 MG tablet TAKE 1 TABLET ONCE DAILY FOR BLOOD PRESSURE   ELDERBERRY PO Take by mouth 3 (three) times a week.    ezetimibe (ZETIA) 10 MG tablet Take 1 tablet (10 mg total) by mouth daily.   fexofenadine (ALLEGRA) 180 MG tablet Take 180 mg by mouth as needed.    fluticasone (FLONASE) 50 MCG/ACT nasal spray Place 2 sprays into both nostrils daily. (Patient taking differently: Place 2 sprays into both nostrils as needed.)   ketorolac (ACULAR) 0.5 % ophthalmic solution Place 1 drop into the right eye 4 (four) times daily.   losartan (COZAAR) 100 MG tablet Take 1 tablet (100 mg total) by mouth daily.   Magnesium Oxide 500 MG CAPS Take 2 capsules by mouth at bedtime.   Multiple Vitamins-Minerals (ZINC PO) Take 1 tablet by mouth once a week.   prednisoLONE acetate (PRED FORTE) 1 % ophthalmic suspension Place 1 drop into the right eye 4 (four) times daily.    simethicone (GAS-X) 80 MG chewable tablet Chew 1 tablet (80 mg total) by mouth every 6 (six) hours  as needed for flatulence.   sitaGLIPtin (JANUVIA) 25 MG tablet TAKE 1 TABLET(25 MG) BY MOUTH DAILY   sodium chloride (OCEAN) 0.65 % SOLN nasal spray Place 1 spray into both nostrils as needed for congestion.   phenylephrine (,USE FOR PREPARATION-H,) 0.25 % suppository Place 1 suppository rectally 2 (two) times daily. (Patient taking differently: Place 1 suppository rectally as needed.)   [DISCONTINUED] KETOROLAC TROMETHAMINE OP Apply 1 drop to eye 4 (four) times daily. Right eye   [DISCONTINUED] meloxicam (MOBIC) 15 MG tablet Take 1 tablet (15 mg total) by mouth daily.    [DISCONTINUED] methocarbamol (ROBAXIN) 500 MG tablet Take 1 tablet (500 mg total) by mouth every 8 (eight) hours as needed for muscle spasms.   [DISCONTINUED] ondansetron (ZOFRAN-ODT) 4 MG disintegrating tablet Take 1 tablet (4 mg total) by mouth every 8 (eight) hours as needed for nausea or vomiting.   [DISCONTINUED] psyllium (REGULOID) 0.52 g capsule Take 0.52 g by mouth daily.   No facility-administered encounter medications on file as of 01/14/2022.    Allergies (verified) Crestor [rosuvastatin calcium], Latex, and Sulfa antibiotics   History: Past Medical History:  Diagnosis Date   Acute upper respiratory infections of unspecified site    Acute upper respiratory infections of unspecified site    Acute upper respiratory infections of unspecified site    Allergic rhinitis due to pollen    Candidiasis of mouth    Disorder of bone and cartilage, unspecified    Diverticulitis of colon (without mention of hemorrhage)(562.11)    Essential hypertension, benign    Essential hypertension, benign    Extrinsic asthma, unspecified    Herpes simplex with other ophthalmic complications    Other abnormal glucose    Other and unspecified hyperlipidemia    Other and unspecified hyperlipidemia    Other and unspecified hyperlipidemia    Other cataract    Other cataract    Other dystrophy of vulva    Other malaise and fatigue    Rash and other nonspecific skin eruption    Reflux esophagitis    Thrombocytopenia, unspecified (HCC)    Type II or unspecified type diabetes mellitus without mention of complication, not stated as uncontrolled    Type II or unspecified type diabetes mellitus without mention of complication, not stated as uncontrolled    Unspecified disorder of skin and subcutaneous tissue    Unspecified essential hypertension    Unspecified essential hypertension    Past Surgical History:  Procedure Laterality Date   ABDOMINAL HYSTERECTOMY     CESAREAN SECTION      [id number]   CYST EXCISION  1974   Pilonidal Cyst excision   EYE SURGERY Right 04/14/2017   Dr. Zadie Rhine   KNEE ARTHROSCOPY Right    for meniscal tear   RETINAL TEAR REPAIR CRYOTHERAPY  10/21/2017   Getting injections in right eye   Family History  Problem Relation Age of Onset   Cancer Mother        brain   Stroke Father    Social History   Socioeconomic History   Marital status: Married    Spouse name: Not on file   Number of children: Not on file   Years of education: Not on file   Highest education level: Not on file  Occupational History   Not on file  Tobacco Use   Smoking status: Former    Types: Cigarettes    Quit date: 12/13/1990    Years since quitting: 31.1   Smokeless tobacco: Never  Vaping Use   Vaping Use: Never used  Substance and Sexual Activity   Alcohol use: No    Alcohol/week: 0.0 standard drinks   Drug use: No   Sexual activity: Yes    Partners: Male    Comment: None   Other Topics Concern   Not on file  Social History Narrative   Not on file   Social Determinants of Health   Financial Resource Strain: Not on file  Food Insecurity: Not on file  Transportation Needs: Not on file  Physical Activity: Not on file  Stress: Not on file  Social Connections: Not on file    Tobacco Counseling Counseling given: Not Answered   Clinical Intake:  Pre-visit preparation completed: No  Pain : 0-10 Pain Score: 4  Pain Type: Chronic pain Pain Location: Hip Pain Orientation: Left Pain Descriptors / Indicators: Aching Pain Onset: More than a month ago Pain Frequency: Constant Pain Relieving Factors: advil Effect of Pain on Daily Activities: No  Pain Relieving Factors: advil  BMI - recorded: 31.1 Nutritional Status: BMI > 30  Obese Nutritional Risks: None Diabetes: Yes CBG done?: Yes (99) CBG resulted in Enter/ Edit results?: No (reports on the phone) Did pt. 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 N  Vision? N  Difficulty concentrating or making decisions? N  Walking or climbing stairs? N  Dressing or bathing? N  Preparing Food and eating ? N  Using the Toilet? Y  Comment constipation metamucil  In the past six months, have you accidently leaked urine? Y  Comment wears pad when go out  Do you have problems with loss of bowel control? N  Managing your Medications? N  Managing your Finances? N  Housekeeping or managing your Housekeeping? N  Some recent data might be hidden    Patient Care Team: Jaydee Conran, Tanya Bucks, NP as PCP - General (Family Medicine) Teena Irani, MD (Inactive) as Consulting Physician (Gastroenterology) Warden Fillers, MD as Consulting Physician (Ophthalmology) [name], MD as Consulting Physician (Ophthalmology) Mickel Fuchs, MD as Referring Physician (Psychiatry) Velora Heckler (Inactive) (Orthotics)  Indicate any recent Medical Services you may have received from other than [facility] providers in the past year (date may be approximate).     Assessment:   This is a routine wellness examination for Tanya Bell.  Hearing/Vision screen Hearing Screening - Comments:: No Hearing Concerns.  Vision Screening - Comments:: No Vision Concerns. Patient wears prescription glasses. Patient last eye exam dated November 2022. Patient [facility]. Next appointment is February 16th, 2023  Dietary issues and exercise activities discussed: Current Exercise Habits: The patient does not participate in regular exercise at present, Exercise limited by: None  identified   Goals Addressed             This Visit's Progress    Exercise 3x per week (30 min per time)   Not on track    I would like to increase my exercise twice per week  I will also like to socialize more  Depression Screen PHQ 2/9 Scores 01/14/2022 01/12/2021 02/21/2020 01/04/2020 04/23/2019 01/02/2019 12/16/2017  PHQ - 2 Score 0 0 0 0 0 0 0    Fall Risk Fall Risk  01/14/2022 01/01/2022 06/26/2021 06/08/2021 01/12/2021  Falls in the past year? 0 0 0 0 0  Number falls in past yr: 0 0 0 0 0  Injury with Fall? 0 0 0 0 0  Risk for fall due to : No Fall Risks No Fall Risks No Fall Risks No Fall Risks -  Follow up Falls evaluation completed Falls evaluation completed Falls evaluation completed Falls evaluation completed -    FALL RISK PREVENTION PERTAINING TO THE HOME:  Any stairs in or around the home? No  If so, are there any without handrails? No  Home free of loose throw rugs in walkways, pet beds, electrical cords, etc? No  Adequate lighting in your home to reduce risk of falls? Yes   ASSISTIVE DEVICES UTILIZED TO PREVENT FALLS:  Life alert? No  Use of a cane, walker or w/c? No  Grab bars in the bathroom? Yes  Shower chair or bench in shower? No  Elevated toilet seat or a handicapped toilet? No   TIMED UP AND GO:  Was the test performed? No .  Length of time to ambulate 10 feet: N/A sec.   Gait slow and steady without use of assistive device  Cognitive Function: MMSE - Mini Mental State Exam 01/04/2020 01/02/2019 12/14/2017 10/29/2016 09/04/2014  Orientation to time 4 5 5 5 5   Orientation to Place 5 5 5 5 5   Registration 3 3 3 3 3   Attention/ Calculation 5 5 5 5 3   Recall 3 3 1 2 1   Language- name 2 objects 2 2 2 2 2   Language- repeat 1 1 1 1 1   Language- follow 3 step command 3 3 3 2 2   Language- read & follow direction 1 1 1 1 1   Write a sentence 1 1 1 1 1   Copy design 1 1 1 1 1   Total score 29 30 28 28 25      6CIT Screen 01/14/2022 01/12/2021  What Year? 0  points 0 points  What month? 0 points 0 points  What time? 0 points 0 points  Count back from 20 0 points 0 points  Months in reverse 0 points 0 points  Repeat phrase 0 points 6 points  Total Score 0 6    Immunizations Immunization History  Administered Date(s) Administered   Fluad Quad(high Dose 65+) 08/29/2019, 09/02/2020, 09/28/2021   Influenza Split 08/25/2010, 08/26/2011, 10/04/2012   Influenza Whole 09/24/2009   Influenza, High Dose Seasonal PF 08/16/2018   Influenza,inj,Quad PF,6+ Mos 09/11/2013, 09/04/2014, 11/12/2015, 10/04/2016, 09/06/2017   Influenza-Unspecified 09/02/2018   PFIZER(Purple Top)SARS-COV-2 Vaccination 02/11/2020, 03/03/2020, 09/17/2020, 07/04/2021, 10/09/2021   Pneumococcal Conjugate-13 02/17/2012   Pneumococcal Polysaccharide-23 05/19/2016   Tdap 02/17/2012   Zoster Recombinat (Shingrix) 10/15/2021    TDAP status: Up to date  Flu Vaccine status: Up to date  Pneumococcal vaccine status: Up to date  Covid-19 vaccine status: Information provided on how to obtain vaccines.   Qualifies for Shingles Vaccine? Yes   Zostavax completed No due for one more dose  Shingrix Completed?: No.    Education has been provided regarding the importance of this vaccine. Patient has been advised to call insurance company to determine out of pocket expense if they have not yet received this vaccine. Advised may also receive vaccine at local pharmacy or Health Dept. Verbalized  acceptance and understanding.  Screening Tests Health Maintenance  Topic Date Due   COVID-19 Vaccine (6 - Booster for Pfizer series) 12/04/2021   Zoster Vaccines- Shingrix (2 of 2) 12/10/2021   TETANUS/TDAP  02/16/2022   HEMOGLOBIN A1C  06/28/2022   OPHTHALMOLOGY EXAM  10/13/2022   FOOT EXAM  11/03/2022   Pneumonia Vaccine 85+ Years old  Completed   INFLUENZA VACCINE  Completed   DEXA SCAN  Completed   Hepatitis C Screening  Completed   HPV VACCINES  Aged Out    Health Maintenance  Health  Maintenance Due  Topic Date Due   COVID-19 Vaccine (6 - Booster for Lena series) 12/04/2021   Zoster Vaccines- Shingrix (2 of 2) 12/10/2021    Colorectal cancer screening: No longer required.   Mammogram status: No longer required due to advance age .  Bone Density status: Completed 09/18/2019. Results reflect: Bone density results: OSTEOPENIA. Repeat every 5 years.  Lung Cancer Screening: (Low Dose CT Chest recommended if Age 18-80 years, 30 pack-year currently smoking OR have quit w/in 15years.) does not qualify.   Lung Cancer Screening Referral: No   Additional Screening:  Hepatitis C Screening: does qualify; Completed yes   Vision Screening: Recommended annual ophthalmology exams for early detection of glaucoma and other disorders of the eye. Is the patient up to date with their annual eye exam?  Yes  Who is the provider or what is the name of the office in which the patient attends annual eye exams? Dr.Fekrat sharon  If pt is not established with a provider, would they like to be referred to a provider to establish care? No .   Dental Screening: Recommended annual dental exams for proper oral hygiene  Community Resource Referral / Chronic Care Management: CRR required this visit?  No   CCM required this visit?  No      Plan:     I have personally reviewed and noted the following in the patients chart:   Medical and social history Use of alcohol, tobacco or illicit drugs  Current medications and supplements including opioid prescriptions.  Functional ability and status Nutritional status Physical activity Advanced directives List of other physicians Hospitalizations, surgeries, and ER visits in previous 12 months Vitals Screenings to include cognitive, depression, and falls Referrals and appointments  In addition, I have reviewed and discussed with patient certain preventive protocols, quality metrics, and best practice recommendations. A written personalized  care plan for preventive services as well as general preventive health recommendations were provided to patient.     Sandrea Hughs, NP   01/14/2022   Nurse Notes: Aware to get final dose of shingles vaccine and COVID-19 final booster vaccine.

## 2022-01-28 DIAGNOSIS — H35371 Puckering of macula, right eye: Secondary | ICD-10-CM | POA: Diagnosis not present

## 2022-01-28 DIAGNOSIS — H3521 Other non-diabetic proliferative retinopathy, right eye: Secondary | ICD-10-CM | POA: Diagnosis not present

## 2022-01-28 DIAGNOSIS — E119 Type 2 diabetes mellitus without complications: Secondary | ICD-10-CM | POA: Diagnosis not present

## 2022-01-28 DIAGNOSIS — Z8669 Personal history of other diseases of the nervous system and sense organs: Secondary | ICD-10-CM | POA: Diagnosis not present

## 2022-01-28 DIAGNOSIS — H35372 Puckering of macula, left eye: Secondary | ICD-10-CM | POA: Diagnosis not present

## 2022-01-28 DIAGNOSIS — D573 Sickle-cell trait: Secondary | ICD-10-CM | POA: Diagnosis not present

## 2022-01-28 DIAGNOSIS — H35351 Cystoid macular degeneration, right eye: Secondary | ICD-10-CM | POA: Diagnosis not present

## 2022-02-05 ENCOUNTER — Other Ambulatory Visit: Payer: Self-pay

## 2022-02-05 ENCOUNTER — Other Ambulatory Visit: Payer: Self-pay | Admitting: Family

## 2022-02-05 MED ORDER — FLUTICASONE PROPIONATE 50 MCG/ACT NA SUSP: 2.0000 | Freq: Every day | NASAL | 1 refills | Status: DC

## 2022-02-05 NOTE — Telephone Encounter (Signed)
Patient request refill

## 2022-02-12 ENCOUNTER — Ambulatory Visit (INDEPENDENT_AMBULATORY_CARE_PROVIDER_SITE_OTHER): Payer: Medicare Other | Admitting: Podiatry

## 2022-02-12 ENCOUNTER — Other Ambulatory Visit: Payer: Self-pay

## 2022-02-12 ENCOUNTER — Encounter: Payer: Self-pay | Admitting: Podiatry

## 2022-02-12 DIAGNOSIS — Q828 Other specified congenital malformations of skin: Secondary | ICD-10-CM

## 2022-02-12 DIAGNOSIS — M79674 Pain in right toe(s): Secondary | ICD-10-CM | POA: Diagnosis not present

## 2022-02-12 DIAGNOSIS — B351 Tinea unguium: Secondary | ICD-10-CM

## 2022-02-12 DIAGNOSIS — M79675 Pain in left toe(s): Secondary | ICD-10-CM

## 2022-02-12 DIAGNOSIS — E119 Type 2 diabetes mellitus without complications: Secondary | ICD-10-CM

## 2022-02-21 NOTE — Progress Notes (Signed)
?  Subjective:  ?Patient ID: Tanya Bell, female    DOB: 02-28-1941,  MRN: [id number] ? ?Tanya Bell presents to clinic today for preventative diabetic foot care and callus(es) b/l lower extremities and painful thick toenails that are difficult to trim. Painful toenails interfere with ambulation. Aggravating factors include wearing enclosed shoe gear. Pain is relieved with periodic professional debridement. Painful calluses are aggravated when weightbearing with and without shoegear. Pain is relieved with periodic professional debridement. ? ?Patient states blood glucose was 111 mg/dl today.   ? ?New problem(s): None.  ? ?PCP is Ngetich, Dinah C, NP , and last visit was 01/14/2022. ? ?Allergies  ?Allergen Reactions  ? Crestor [Rosuvastatin Calcium]   ?  Muscle cramps  ? Latex Other (See Comments)  ?  GLOVES  ? Sulfa Antibiotics   ?Review of Systems: Negative except as noted in the HPI. ? ?Objective: ?Physical Exam ? ?General: Tanya Bell is a pleasant 81 y.o. African American female, WD, WN in NAD. AAO x 3.  ? ?Vascular:  ?Capillary refill time to digits immediate b/l. Palpable DP pulse(s) b/l lower extremities Palpable PT pulse(s) b/l lower extremities Pedal hair present. Lower extremity skin temperature gradient within normal limits. No pain with calf compression b/l. Varicosities present b/l. ? ?Dermatological:  ?Pedal skin with normal turgor, texture and tone b/l lower extremities. No open wounds b/l lower extremities. No interdigital macerations b/l lower extremities. Toenails digits 1-4 bilaterally elongated, discolored, dystrophic, thickened, and crumbly with subungual debris and tenderness to dorsal palpation. Anonychia noted L 5th toe and R 5th toe. Nailbed(s) epithelialized.  Porokeratotic lesion(s) submet head 1 left foot, submet head 1 right foot, submet head 5 left foot, and submet head 5 right foot. No erythema, no edema, no drainage, no fluctuance. ? ?Musculoskeletal:  ?Normal  muscle strength 5/5 to all lower extremity muscle groups bilaterally. Hallux valgus with bunion deformity noted b/l lower extremities. Hammertoe(s) noted to the L 5th toe and R 5th toe. Patient ambulates independently. ? ?Neurological:  ?Protective sensation intact 5/5 intact bilaterally with 10g monofilament b/l. Vibratory sensation intact b/l. ? ?Hemoglobin A1C Latest Ref Rng & Units 12/29/2021 06/24/2021  ?HGBA1C <5.7 % of total Hgb 6.5(H) 6.2(H)  ?Some recent data might be hidden  ? ?Assessment/Plan: ?1. Pain due to onychomycosis of toenails of both feet   ?2. Porokeratosis   ?3. Controlled type 2 diabetes mellitus without complication, without long-term current use of insulin ([place])   ?  ? ?-Toenails 1-4 bilaterally debrided in length and girth without iatrogenic bleeding with sterile nail nipper and dremel.  ?-Callus(es) submet head 1 b/l and submet head 5 b/l pared utilizing sterile scalpel blade without complication or incident. Total number debrided =4. ?-Patient/POA to call should there be question/concern in the interim.  ? ?Return in about 9 weeks (around 04/16/2022). ? ?Marzetta Board, DPM  ?

## 2022-02-26 ENCOUNTER — Telehealth: Payer: Self-pay | Admitting: *Deleted

## 2022-02-26 MED ORDER — MOMETASONE FUROATE 50 MCG/ACT NA SUSP: NASAL | 5 refills | Status: DC

## 2022-02-26 NOTE — Telephone Encounter (Signed)
Patient called and stated that she cannot take Flonase. Stated that it does not help her symptoms and it makes her Nauseated.  ? ?Patient is requesting to go back on her Nasonex that she has been taking for years and it works.  ? ?Requesting a Rx to be sent to local pharmacy and she will pay out of pocket and requesting [place] to do a Prior Authorization to see if we can get it covered by her insurance.  ? ?Is it ok to add back to patient's current Medication list and send in a Rx and do PA.  ?Please Advise.  ?

## 2022-02-26 NOTE — Telephone Encounter (Signed)
Medication list updated and Rx sent to pharmacy as requested.  ?Prior Authorization initiated through CoverMyMeds ?Key [id number].  ?Awaiting Determination.  ?

## 2022-02-26 NOTE — Telephone Encounter (Signed)
-   d/c Flonase  ?- Restart Nasonex per patient's request  ?

## 2022-03-01 ENCOUNTER — Telehealth: Payer: Self-pay

## 2022-03-01 MED ORDER — SITAGLIPTIN PHOSPHATE 25 MG PO TABS: ORAL_TABLET | ORAL | 1 refills | Status: DC

## 2022-03-01 NOTE — Telephone Encounter (Signed)
Patient returned call and stated that she use [facility] mail order pharmacy. Patient was advised that her Jan[facility] will [facility] her request. ?

## 2022-03-12 ENCOUNTER — Other Ambulatory Visit: Payer: Self-pay

## 2022-03-12 MED ORDER — LOSARTAN POTASSIUM 100 MG PO TABS: 100.0000 mg | ORAL_TABLET | Freq: Every day | ORAL | 0 refills | Status: DC

## 2022-03-12 MED ORDER — LOSARTAN POTASSIUM 100 MG PO TABS: 100.0000 mg | ORAL_TABLET | Freq: Every day | ORAL | 1 refills | Status: DC

## 2022-03-12 NOTE — Telephone Encounter (Signed)
[facility] and to local pharmacy while she waits for mail order. ?

## 2022-04-02 ENCOUNTER — Other Ambulatory Visit: Payer: Self-pay | Admitting: *Deleted

## 2022-04-02 DIAGNOSIS — I10 Essential (primary) hypertension: Secondary | ICD-10-CM

## 2022-04-02 MED ORDER — EZETIMIBE 10 MG PO TABS: 10.0000 mg | ORAL_TABLET | Freq: Every day | ORAL | 1 refills | Status: DC

## 2022-04-02 MED ORDER — CARVEDILOL 12.5 MG PO TABS: ORAL_TABLET | ORAL | 1 refills | Status: DC

## 2022-04-02 NOTE — Telephone Encounter (Signed)
Patient called back and stated that it is the Zetia she is needing.  ?

## 2022-04-02 NOTE — Telephone Encounter (Signed)
Patient called and left message on Clinical intake stating that she needed refills on Carvedilol and Rouvastatin sent to [facility].   ? ?Rouvastatin '20mg'$  is NOT in patient's current medication list.  ?Called and LMOM for patient to return call to confirm.  ?Awaiting callback.  ?

## 2022-04-16 ENCOUNTER — Ambulatory Visit: Payer: Medicare Other | Admitting: Podiatry

## 2022-04-30 ENCOUNTER — Other Ambulatory Visit: Payer: Self-pay | Admitting: *Deleted

## 2022-04-30 MED ORDER — MOMETASONE FUROATE 50 MCG/ACT NA SUSP: NASAL | 1 refills | Status: DC

## 2022-04-30 NOTE — Telephone Encounter (Signed)
Patient requested 90 day supply to be sent to [facility].

## 2022-05-05 ENCOUNTER — Ambulatory Visit (INDEPENDENT_AMBULATORY_CARE_PROVIDER_SITE_OTHER): Payer: Medicare Other | Admitting: Podiatry

## 2022-05-05 ENCOUNTER — Encounter: Payer: Self-pay | Admitting: Podiatry

## 2022-05-05 DIAGNOSIS — M79675 Pain in left toe(s): Secondary | ICD-10-CM | POA: Diagnosis not present

## 2022-05-05 DIAGNOSIS — M79674 Pain in right toe(s): Secondary | ICD-10-CM

## 2022-05-05 DIAGNOSIS — M2042 Other hammer toe(s) (acquired), left foot: Secondary | ICD-10-CM

## 2022-05-05 DIAGNOSIS — E119 Type 2 diabetes mellitus without complications: Secondary | ICD-10-CM | POA: Diagnosis not present

## 2022-05-05 DIAGNOSIS — M2041 Other hammer toe(s) (acquired), right foot: Secondary | ICD-10-CM

## 2022-05-05 DIAGNOSIS — B351 Tinea unguium: Secondary | ICD-10-CM | POA: Diagnosis not present

## 2022-05-05 DIAGNOSIS — L84 Corns and callosities: Secondary | ICD-10-CM | POA: Diagnosis not present

## 2022-05-05 NOTE — Progress Notes (Signed)
dme  Subjective:  Patient ID: Tanya Bell, female    DOB: 1941/02/07,  MRN: [id number]  Tanya Bell presents to clinic today for preventative diabetic foot care and painful porokeratotic lesion(s) b/l lower extremities and painful mycotic toenails that limit ambulation. Painful toenails interfere with ambulation. Aggravating factors include wearing enclosed shoe gear. Pain is relieved with periodic professional debridement. Painful porokeratotic lesions are aggravated when weightbearing with and without shoegear. Pain is relieved with periodic professional debridement.  Patient states blood glucose was 113 mg/dl today.  Last known HgA1c was in the 6% range.  She is inquiring about new diabetic shoes on today's visit.  New problem(s): None.   PCP is Ngetich, Nelda Bucks, NP , and last visit was January 01, 2022.  Allergies  Allergen Reactions   Crestor [Rosuvastatin Calcium]     Muscle cramps   Latex Other (See Comments)    GLOVES   Sulfa Antibiotics     Review of Systems: Negative except as noted in the HPI.  Objective: No changes noted in today's physical examination. General: Tanya Bell is a pleasant 81 y.o. African American female, WD, WN in NAD. AAO x 3.   Vascular:  Capillary refill time to digits immediate b/l. Palpable DP pulse(s) b/l lower extremities Palpable PT pulse(s) b/l lower extremities Pedal hair present. Lower extremity skin temperature gradient within normal limits. No pain with calf compression b/l. Varicosities present b/l.  Dermatological:  Pedal skin with normal turgor, texture and tone b/l lower extremities. No open wounds b/l lower extremities. No interdigital macerations b/l lower extremities. Toenails digits 1-4 bilaterally elongated, discolored, dystrophic, thickened, and crumbly with subungual debris and tenderness to dorsal palpation. Anonychia noted L 5th toe and R 5th toe. Nailbed(s) epithelialized.  Hyperkeratotic lesion(s) submet  head 1 left foot, submet head 1 right foot, submet head 5 left foot, and submet head 5 right foot. No erythema, no edema, no drainage, no fluctuance.  Musculoskeletal:  Normal muscle strength 5/5 to all lower extremity muscle groups bilaterally. Hallux valgus with bunion deformity noted b/l lower extremities. Hammertoe(s) noted to the L 5th toe and R 5th toe. Patient ambulates independently.  Neurological:  Protective sensation intact 5/5 intact bilaterally with 10g monofilament b/l. Vibratory sensation intact b/l.      Latest Ref Rng & Units 12/29/2021    8:54 AM 06/24/2021   12:00 AM  Hemoglobin A1C  Hemoglobin-A1c <5.7 % of total Hgb 6.5   6.2     Assessment/Plan: 1. Pain due to onychomycosis of toenails of both feet   2. Callus   3. Acquired hammertoes of both feet   4. Controlled type 2 diabetes mellitus without complication, without long-term current use of insulin ([place])     -Patient was evaluated and treated. All patient's and/or POA's questions/concerns answered on today's visit. -Continue diabetic foot care principles: inspect feet daily, monitor glucose as recommended by PCP and/or Endocrinologist, and follow prescribed diet per PCP, Endocrinologist and/or dietician. -Patient to continue soft, supportive shoe gear daily. Order entered for one pair extra depth shoes and 3 pair custom molded insoles. Patient qualifies based on diagnoses. -Mycotic toenails 1-4 bilaterally were debrided in length and girth with sterile nail nippers and dremel without iatrogenic bleeding. -Porokeratotic lesion(s) submet head 1 b/l and submet head 5 b/l pared and enucleated with sterile scalpel blade without incident. Total number of lesions debrided=4. -Patient/POA to call should there be question/concern in the interim.   Return in about 3 months (around 08/05/2022).  Marzetta Board, DPM

## 2022-05-06 ENCOUNTER — Telehealth: Payer: Self-pay

## 2022-05-06 NOTE — Telephone Encounter (Signed)
CMN submitted ?

## 2022-05-19 ENCOUNTER — Ambulatory Visit: Payer: Medicare Other

## 2022-05-19 DIAGNOSIS — E119 Type 2 diabetes mellitus without complications: Secondary | ICD-10-CM

## 2022-05-19 DIAGNOSIS — M2041 Other hammer toe(s) (acquired), right foot: Secondary | ICD-10-CM

## 2022-05-19 NOTE — Progress Notes (Signed)
SITUATION Reason for Consult: Evaluation for Prefabricated Diabetic Shoes and Custom Diabetic Inserts. Patient / Caregiver Report: Patient would like well fitting shoes  OBJECTIVE DATA: Patient History / Diagnosis:    ICD-10-CM   1. Controlled type 2 diabetes mellitus without complication, without long-term current use of insulin (HCC)  E11.9     2. Acquired hammertoes of both feet  M20.41    M20.42       Physician Treating Diabetes:  Alain Honey, MD  Current or Previous Devices:   Historical user  In-Person Foot Examination: Ulcers & Callousing:   Callusing Deformities:    Hammertoes Sensation:    Compromised  Shoe Size:     7.9M  ORTHOTIC RECOMMENDATION Recommended Devices: - 1x pair prefabricated PDAC approved diabetic shoes; Patient Selected Orthofeet francis pink Size 7.9M - 3x pair custom-to-patient PDAC approved vacuum formed diabetic insoles.  GOALS OF SHOES AND INSOLES - Reduce shear and pressure - Reduce / Prevent callus formation - Reduce / Prevent ulceration - Protect the fragile healing compromised diabetic foot.  Patient would benefit from diabetic shoes and inserts as patient has diabetes mellitus and the patient has one or more of the following conditions: - History of partial or complete amputation of the foot - History of previous foot ulceration. - History of pre-ulcerative callus - Peripheral neuropathy with evidence of callus formation - Foot deformity - Poor circulation  ACTIONS PERFORMED Potential out of pocket cost was communicated to patient. Patient understood and consented to measurement and casting. Patient was casted for insoles via crush box and measured for shoes via brannock device. Procedure was explained and patient tolerated procedure well. All questions were answered and concerns addressed. Casts were shipped to central fabrication for HOLD until Certificate of Medical Necessity or otherwise necessary authorization from insurance is  obtained.  PLAN Shoes are to be ordered and casts released from hold once all appropriate paperwork is complete. Patient is to be contacted and scheduled for fitting once shoes and insoles have been fabricated and received.

## 2022-06-07 ENCOUNTER — Other Ambulatory Visit: Payer: Self-pay | Admitting: *Deleted

## 2022-06-07 ENCOUNTER — Telehealth: Payer: Self-pay

## 2022-06-07 DIAGNOSIS — I10 Essential (primary) hypertension: Secondary | ICD-10-CM

## 2022-06-07 MED ORDER — AMLODIPINE BESYLATE 5 MG PO TABS: 5.0000 mg | ORAL_TABLET | Freq: Every day | ORAL | 0 refills | Status: DC

## 2022-06-07 NOTE — Telephone Encounter (Signed)
Patient called requesting a short supply of medication to be sent to pharmacy. Stated that she is waiting for [facility] to deliver and she is completely out of Amlodipine.

## 2022-06-08 ENCOUNTER — Other Ambulatory Visit: Payer: Self-pay | Admitting: Family

## 2022-06-08 ENCOUNTER — Other Ambulatory Visit: Payer: Self-pay

## 2022-06-08 DIAGNOSIS — I10 Essential (primary) hypertension: Secondary | ICD-10-CM

## 2022-06-08 MED ORDER — AMLODIPINE BESYLATE 5 MG PO TABS: 5.0000 mg | ORAL_TABLET | Freq: Every day | ORAL | 0 refills | Status: DC

## 2022-06-14 ENCOUNTER — Telehealth: Payer: Self-pay

## 2022-06-14 NOTE — Telephone Encounter (Signed)
[facility] faxed a request for chart note for patient showing how often patient checks her blood sugars and the request was faxed to [place].

## 2022-06-28 ENCOUNTER — Other Ambulatory Visit: Payer: Medicare Other

## 2022-07-02 ENCOUNTER — Ambulatory Visit: Payer: Medicare Other | Admitting: Family

## 2022-07-05 ENCOUNTER — Other Ambulatory Visit: Payer: Medicare Other

## 2022-07-05 DIAGNOSIS — E785 Hyperlipidemia, unspecified: Secondary | ICD-10-CM | POA: Diagnosis not present

## 2022-07-05 DIAGNOSIS — E1142 Type 2 diabetes mellitus with diabetic polyneuropathy: Secondary | ICD-10-CM | POA: Diagnosis not present

## 2022-07-05 DIAGNOSIS — I10 Essential (primary) hypertension: Secondary | ICD-10-CM | POA: Diagnosis not present

## 2022-07-05 DIAGNOSIS — K219 Gastro-esophageal reflux disease without esophagitis: Secondary | ICD-10-CM | POA: Diagnosis not present

## 2022-07-06 LAB — COMPLETE METABOLIC PANEL WITH GFR
AG Ratio: 1.2 (calc) (ref 1.0–2.5)
ALT: 13 U/L (ref 6–29)
AST: 12 U/L (ref 10–35)
Albumin: 4 g/dL (ref 3.6–5.1)
Alkaline phosphatase (APISO): 71 U/L (ref 37–153)
BUN: 14 mg/dL (ref 7–25)
CO2: 24 mmol/L (ref 20–32)
Calcium: 9.3 mg/dL (ref 8.6–10.4)
Chloride: 106 mmol/L (ref 98–110)
Creat: 0.74 mg/dL (ref 0.60–0.95)
Globulin: 3.3 g/dL (calc) (ref 1.9–3.7)
Glucose, Bld: 132 mg/dL — ABNORMAL HIGH (ref 65–99)
Potassium: 4.4 mmol/L (ref 3.5–5.3)
Sodium: 140 mmol/L (ref 135–146)
Total Bilirubin: 0.6 mg/dL (ref 0.2–1.2)
Total Protein: 7.3 g/dL (ref 6.1–8.1)
eGFR: 82 mL/min/{1.73_m2} (ref >=60)

## 2022-07-06 LAB — CBC WITH DIFFERENTIAL/PLATELET
Absolute Monocytes: 566 cells/uL (ref 200–950)
Basophils Absolute: 41 cells/uL (ref 0–200)
Basophils Relative: 0.7 %
Eosinophils Absolute: 171 cells/uL (ref 15–500)
Eosinophils Relative: 2.9 %
HCT: 42.2 % (ref 35.0–45.0)
Hemoglobin: 14.1 g/dL (ref 11.7–15.5)
Lymphs Abs: 2702 cells/uL (ref 850–3900)
MCH: 25.7 pg — ABNORMAL LOW (ref 27.0–33.0)
MCHC: 33.4 g/dL (ref 32.0–36.0)
MCV: 77 fL — ABNORMAL LOW (ref 80.0–100.0)
MPV: 11.7 fL (ref 7.5–12.5)
Monocytes Relative: 9.6 %
Neutro Abs: 2419 cells/uL (ref 1500–7800)
Neutrophils Relative %: 41 %
Platelets: 180 10*3/uL (ref 140–400)
RBC: 5.48 10*6/uL — ABNORMAL HIGH (ref 3.80–5.10)
RDW: 15.8 % — ABNORMAL HIGH (ref 11.0–15.0)
Total Lymphocyte: 45.8 %
WBC: 5.9 10*3/uL (ref 3.8–10.8)

## 2022-07-06 LAB — HEMOGLOBIN A1C
Hgb A1c MFr Bld: 6.2 % of total Hgb — ABNORMAL HIGH (ref <5.7)
Mean Plasma Glucose: 131 mg/dL
eAG (mmol/L): 7.3 mmol/L

## 2022-07-06 LAB — LIPID PANEL
Cholesterol: 156 mg/dL (ref <200)
HDL: 52 mg/dL (ref >=50)
LDL Cholesterol (Calc): 90 mg/dL (calc)
Non-HDL Cholesterol (Calc): 104 mg/dL (calc) (ref <130)
Total CHOL/HDL Ratio: 3 (calc) (ref <5.0)
Triglycerides: 58 mg/dL (ref <150)

## 2022-07-08 NOTE — Patient Instructions (Addendum)
Please contact your local pharmacy, previous provider, or insurance carrier for vaccine/immunization records. Ensure that any procedures done outside of [facility] are faxed to [place] [phone] or you can sign release of records form at the front desk to keep your medical record updated.    Please get Tetanus vaccine and COVID-19 booster vaccine at your pharmacy

## 2022-07-09 ENCOUNTER — Encounter: Payer: Self-pay | Admitting: Family

## 2022-07-09 ENCOUNTER — Ambulatory Visit (INDEPENDENT_AMBULATORY_CARE_PROVIDER_SITE_OTHER): Payer: Medicare Other | Admitting: Family

## 2022-07-09 VITALS — BP 130/70 | HR 70 | Temp 97.2°F | Resp 16 | Ht 61.0 in | Wt 165.4 lb

## 2022-07-09 DIAGNOSIS — I10 Essential (primary) hypertension: Secondary | ICD-10-CM

## 2022-07-09 DIAGNOSIS — K219 Gastro-esophageal reflux disease without esophagitis: Secondary | ICD-10-CM | POA: Diagnosis not present

## 2022-07-09 DIAGNOSIS — M79641 Pain in right hand: Secondary | ICD-10-CM | POA: Diagnosis not present

## 2022-07-09 DIAGNOSIS — E785 Hyperlipidemia, unspecified: Secondary | ICD-10-CM | POA: Diagnosis not present

## 2022-07-09 DIAGNOSIS — E1142 Type 2 diabetes mellitus with diabetic polyneuropathy: Secondary | ICD-10-CM | POA: Diagnosis not present

## 2022-07-09 NOTE — Progress Notes (Signed)
Provider: Marlowe Sax FNP-C   Rini Moffit, Nelda Bucks, NP  Patient Care Team: Jamica Woodyard, Nelda Bucks, NP as PCP - General (Family Medicine) Teena Irani, MD (Inactive) as Consulting Physician (Gastroenterology) Warden Fillers, MD as Consulting Physician (Ophthalmology) [name], MD as Consulting Physician (Ophthalmology) Mickel Fuchs, MD as Referring Physician (Psychiatry) Velora Heckler (Inactive) (Orthotics)  Extended Emergency Contact Information Primary Emergency Contact: [place] Phone: [phone] Relation: None  Code Status:  Full Code  Goals of care: Advanced Directive information    07/09/2022   10:25 AM  Advanced Directives  Does Patient Have a Medical Advance Directive? No  Would patient like information on creating a medical advance directive? No - Patient declined     Chief Complaint  Patient presents with   Medical Management of Chronic Issues    6 month follow up.    Immunizations    Discuss the need for Shingrix vaccine, Tetanus vaccine, and Covid Booster.     HPI:  Pt is a 80 y.o. female seen today for 6 months follow for medical management of chronic diseases.  Has a medical history of hypertension, controlled type 2 diabetes with peripheral neuropathy, hyperlipidemia, obesity among other conditions.  No home blood pressure readings for review.denies any headache,dizziness,vision changes,fatigue,chest tightness,palpitation,chest pain or shortness of breath.    Normal CBG log for review.  She denies any symptoms of hypo or hyperglycemia.  Stated annual eye exam and foot exam due in November 2023.  Has appointment already.  Weight stable from previous.  No fall episodes or recent hospitalization.  Due for Shingrix, tetanus and COVID booster vaccine.Discussed with her to get vaccines at the pharmacy.   Past Medical History:  Diagnosis Date   Acute upper respiratory infections of unspecified site    Acute upper respiratory infections of  unspecified site    Acute upper respiratory infections of unspecified site    Allergic rhinitis due to pollen    Candidiasis of mouth    Disorder of bone and cartilage, unspecified    Diverticulitis of colon (without mention of hemorrhage)(562.11)    Essential hypertension, benign    Essential hypertension, benign    Extrinsic asthma, unspecified    Herpes simplex with other ophthalmic complications    Other abnormal glucose    Other and unspecified hyperlipidemia    Other and unspecified hyperlipidemia    Other and unspecified hyperlipidemia    Other cataract    Other cataract    Other dystrophy of vulva    Other malaise and fatigue    Rash and other nonspecific skin eruption    Reflux esophagitis    Thrombocytopenia, unspecified (HCC)    Type II or unspecified type diabetes mellitus without mention of complication, not stated as uncontrolled    Type II or unspecified type diabetes mellitus without mention of complication, not stated as uncontrolled    Unspecified disorder of skin and subcutaneous tissue    Unspecified essential hypertension    Unspecified essential hypertension    Past Surgical History:  Procedure Laterality Date   ABDOMINAL HYSTERECTOMY     CESAREAN SECTION     [phone]   CYST EXCISION  1974   Pilonidal Cyst excision   EYE SURGERY Right 04/14/2017   Dr. Zadie Rhine   KNEE ARTHROSCOPY Right    for meniscal tear   RETINAL TEAR REPAIR CRYOTHERAPY  10/21/2017   Getting injections in right eye    Allergies  Allergen Reactions   Crestor [Rosuvastatin Calcium]  Muscle cramps   Latex Other (See Comments)    GLOVES   Sulfa Antibiotics     Allergies as of 07/09/2022       Reactions   Crestor [rosuvastatin Calcium]    Muscle cramps   Latex Other (See Comments)   GLOVES   Sulfa Antibiotics         Medication List        Accurate as of July 09, 2022 10:32 AM. If you have any questions, ask your nurse or doctor.           acetaminophen 325 MG tablet Commonly known as: TYLENOL Take 325 mg by mouth daily as needed. For pain   albuterol 108 (90 Base) MCG/ACT inhaler Commonly known as: Ventolin HFA Inhale 2 puffs into the lungs every 6 (six) hours as needed for wheezing or shortness of breath.   amLODipine 5 MG tablet Commonly known as: NORVASC TAKE 1 TABLET(5 MG) BY MOUTH DAILY   budesonide-formoterol 160-4.5 MCG/ACT inhaler Commonly known as: SYMBICORT Inhale 2 puffs into the lungs 2 (two) times daily. PLEASE GIVE PATIENT 3 MONTH SUPPLY.   calcium-vitamin D 500-200 MG-UNIT tablet Commonly known as: OSCAL WITH D Take 1 tablet by mouth 2 (two) times daily.   carvedilol 12.5 MG tablet Commonly known as: COREG TAKE 1 TABLET ONCE DAILY FOR BLOOD PRESSURE   ELDERBERRY PO Take by mouth 3 (three) times a week.   ezetimibe 10 MG tablet Commonly known as: ZETIA Take 1 tablet (10 mg total) by mouth daily.   fexofenadine 180 MG tablet Commonly known as: ALLEGRA Take 180 mg by mouth as needed.   ketorolac 0.5 % ophthalmic solution Commonly known as: ACULAR Place 1 drop into the right eye 4 (four) times daily.   losartan 100 MG tablet Commonly known as: Cozaar Take 1 tablet (100 mg total) by mouth daily.   Magnesium Oxide -Mg Supplement 500 MG Caps Take 2 capsules by mouth at bedtime.   mometasone 50 MCG/ACT nasal spray Commonly known as: NASONEX One Spray each Nostril twice daily as needed for allergies.   phenylephrine 0.25 % suppository Commonly known as: (USE for PREPARATION-H) Place 1 suppository rectally as needed for hemorrhoids. What changed: Another medication with the same name was removed. Continue taking this medication, and follow the directions you see here. Changed by: Sandrea Hughs, NP   prednisoLONE acetate 1 % ophthalmic suspension Commonly known as: PRED FORTE Place 1 drop into the right eye 4 (four) times daily.   simethicone 80 MG chewable tablet Commonly known  as: Gas-X Chew 1 tablet (80 mg total) by mouth every 6 (six) hours as needed for flatulence.   sitaGLIPtin 25 MG tablet Commonly known as: Januvia TAKE 1 TABLET(25 MG) BY MOUTH DAILY   sodium chloride 0.65 % Soln nasal spray Commonly known as: OCEAN Place 1 spray into both nostrils as needed for congestion.   ZINC PO Take 1 tablet by mouth once a week.        Review of Systems  Constitutional:  Negative for appetite change, chills, fatigue, fever and unexpected weight change.  HENT:  Negative for congestion, dental problem, ear discharge, ear pain, facial swelling, hearing loss, nosebleeds, postnasal drip, rhinorrhea, sinus pressure, sinus pain, sneezing, sore throat, tinnitus and trouble swallowing.   Eyes:  Negative for pain, discharge, redness, itching and visual disturbance.  Respiratory:  Negative for cough, chest tightness, shortness of breath and wheezing.   Cardiovascular:  Negative for chest pain, palpitations and leg  swelling.  Gastrointestinal:  Negative for abdominal distention, abdominal pain, blood in stool, constipation, diarrhea, nausea and vomiting.  Endocrine: Negative for cold intolerance, heat intolerance, polydipsia, polyphagia and polyuria.  Genitourinary:  Negative for difficulty urinating, dysuria, flank pain, frequency and urgency.  Musculoskeletal:  Negative for arthralgias, back pain, gait problem, joint swelling, myalgias, neck pain and neck stiffness.  Skin:  Negative for color change, pallor, rash and wound.  Neurological:  Negative for dizziness, syncope, speech difficulty, weakness, light-headedness, numbness and headaches.  Hematological:  Does not bruise/bleed easily.  Psychiatric/Behavioral:  Negative for agitation, behavioral problems, confusion, hallucinations, self-injury, sleep disturbance and suicidal ideas. The patient is not nervous/anxious.     Immunization History  Administered Date(s) Administered   Fluad Quad(high Dose 65+) 08/29/2019,  09/02/2020, 09/28/2021   Influenza Split 08/25/2010, 08/26/2011, 10/04/2012   Influenza Whole 09/24/2009   Influenza, High Dose Seasonal PF 08/16/2018   Influenza,inj,Quad PF,6+ Mos 09/11/2013, 09/04/2014, 11/12/2015, 10/04/2016, 09/06/2017   Influenza-Unspecified 09/02/2018   PFIZER(Purple Top)SARS-COV-2 Vaccination 02/11/2020, 03/03/2020, 09/17/2020, 07/04/2021, 10/09/2021   Pneumococcal Conjugate-13 02/17/2012   Pneumococcal Polysaccharide-23 05/19/2016   Tdap 02/17/2012   Zoster Recombinat (Shingrix) 10/15/2021   Pertinent  Health Maintenance Due  Topic Date Due   INFLUENZA VACCINE  07/13/2022   OPHTHALMOLOGY EXAM  10/13/2022   FOOT EXAM  11/03/2022   HEMOGLOBIN A1C  01/05/2023   DEXA SCAN  Completed      06/08/2021    1:13 PM 06/26/2021    9:16 AM 01/01/2022    9:38 AM 01/14/2022    9:28 AM 07/09/2022   10:25 AM  Fall Risk  Falls in the past year? 0 0 0 0 0  Was there an injury with Fall? 0 0 0 0 0  Fall Risk Category Calculator 0 0 0 0 0  Fall Risk Category Low Low Low Low Low  Patient Fall Risk Level  Low fall risk Low fall risk Low fall risk Low fall risk  Patient at Risk for Falls Due to No Fall Risks No Fall Risks No Fall Risks No Fall Risks No Fall Risks  Fall risk Follow up Falls evaluation completed Falls evaluation completed Falls evaluation completed Falls evaluation completed Falls evaluation completed   Functional Status Survey:    Vitals:   07/09/22 1016  BP: 130/70  Pulse: 70  Resp: 16  Temp: (!) 97.2 F (36.2 C)  SpO2: 93%  Weight: 165 lb 6.4 oz (75 kg)  Height: '5\' 1"'$  (1.549 m)   Body mass index is 31.25 kg/m. Physical Exam Vitals reviewed.  Constitutional:      General: She is not in acute distress.    Appearance: Normal appearance. She is obese. She is not ill-appearing or diaphoretic.  HENT:     Head: Normocephalic.     Right Ear: Tympanic membrane, ear canal and external ear normal. There is no impacted cerumen.     Left Ear: Tympanic  membrane, ear canal and external ear normal. There is no impacted cerumen.     Nose: Nose normal. No congestion or rhinorrhea.     Mouth/Throat:     Mouth: Mucous membranes are moist.     Pharynx: Oropharynx is clear. No oropharyngeal exudate or posterior oropharyngeal erythema.  Eyes:     General: No scleral icterus.       Right eye: No discharge.        Left eye: No discharge.     Extraocular Movements: Extraocular movements intact.     Conjunctiva/sclera: Conjunctivae normal.  Pupils: Pupils are equal, round, and reactive to light.  Neck:     Vascular: No carotid bruit.  Cardiovascular:     Rate and Rhythm: Normal rate and regular rhythm.     Pulses: Normal pulses.     Heart sounds: Normal heart sounds. No murmur heard.    No friction rub. No gallop.  Pulmonary:     Effort: Pulmonary effort is normal. No respiratory distress.     Breath sounds: Normal breath sounds. No wheezing, rhonchi or rales.  Chest:     Chest wall: No tenderness.  Abdominal:     General: Bowel sounds are normal. There is no distension.     Palpations: Abdomen is soft. There is no mass.     Tenderness: There is no abdominal tenderness. There is no right CVA tenderness, left CVA tenderness, guarding or rebound.  Musculoskeletal:        General: No swelling or tenderness. Normal range of motion.     Cervical back: Normal range of motion. No rigidity or tenderness.     Right lower leg: No edema.     Left lower leg: No edema.  Lymphadenopathy:     Cervical: No cervical adenopathy.  Skin:    General: Skin is warm and dry.     Coloration: Skin is not pale.     Findings: No bruising, erythema, lesion or rash.  Neurological:     Mental Status: She is alert and oriented to person, place, and time.     Cranial Nerves: No cranial nerve deficit.     Sensory: No sensory deficit.     Motor: No weakness.     Coordination: Coordination normal.     Gait: Gait normal.  Psychiatric:        Mood and Affect: Mood  normal.        Speech: Speech normal.        Behavior: Behavior normal.        Thought Content: Thought content normal.        Judgment: Judgment normal.    Labs reviewed: Recent Labs    12/29/21 0854 07/05/22 0940  NA 139 140  K 3.9 4.4  CL 105 106  CO2 29 24  GLUCOSE 120* 132*  BUN 13 14  CREATININE 0.69 0.74  CALCIUM 9.3 9.3   Recent Labs    12/29/21 0854 07/05/22 0940  AST 14 12  ALT 14 13  BILITOT 0.4 0.6  PROT 7.6 7.3   Recent Labs    12/29/21 0854 07/05/22 0940  WBC 6.2 5.9  NEUTROABS 2,251 2,419  HGB 13.7 14.1  HCT 41.7 42.2  MCV 78.7* 77.0*  PLT 164 180   Lab Results  Component Value Date   TSH 2.13 12/29/2021   Lab Results  Component Value Date   HGBA1C 6.2 (H) 07/05/2022   Lab Results  Component Value Date   CHOL 156 07/05/2022   HDL 52 07/05/2022   LDLCALC 90 07/05/2022   TRIG 58 07/05/2022   CHOLHDL 3.0 07/05/2022    Significant Diagnostic Results in last 30 days:  No results found.  Assessment/Plan  1. Essential hypertension Blood pressure well-controlled -Continue on losartan and carvedilol - Lipid panel; Future - TSH; Future - COMPLETE METABOLIC PANEL WITH GFR; Future - CBC with Differential/Platelet; Future  2. Controlled type 2 diabetes mellitus with diabetic polyneuropathy, without long-term current use of insulin (HCC) Lab Results  Component Value Date   HGBA1C 6.2 (H) 07/05/2022  -Continue on Januvia -  Hemoglobin A1c; Future -Has upcoming annual eye and foot exam in November 2023 -Advised to continue with dietary modification and exercise as tolerated  3. Hyperlipidemia LDL goal <100 LDL at goal - Not on statin due to allergies - Lipid panel; Future  4. Gastroesophageal reflux disease without esophagitis H&H stable Continue to monitor - COMPLETE METABOLIC PANEL WITH GFR; Future - CBC with Differential/Platelet; Future  5. Right hand pain Unclear etiology -Continue over-the-counter analgesic.Will obtain  imaging and referral if indicated - DG Hand Complete Right; Future  Family/ staff Communication: Reviewed plan of care with patient verbalized understanding  Labs/tests ordered:  - DG Hand Complete Right; Future - COMPLETE METABOLIC PANEL WITH GFR; Future - CBC with Differential/Platelet; Future - Lipid panel; Future - TSH; Future - Hemoglobin A1c; Future  Next Appointment : Return in about 6 months (around 01/09/2023) for medical mangement of chronic issues., Fasting labs in 6 months prior to visit.   Sandrea Hughs, NP  ,

## 2022-08-06 ENCOUNTER — Telehealth: Payer: Self-pay | Admitting: Podiatry

## 2022-08-06 NOTE — Telephone Encounter (Signed)
Left message for patient to advise that we have not received paperwork from provider and that we are resending

## 2022-08-10 ENCOUNTER — Ambulatory Visit: Payer: Medicare Other | Admitting: Podiatry

## 2022-08-11 ENCOUNTER — Encounter: Payer: Self-pay | Admitting: Podiatry

## 2022-08-12 DIAGNOSIS — Z8669 Personal history of other diseases of the nervous system and sense organs: Secondary | ICD-10-CM | POA: Insufficient documentation

## 2022-08-12 DIAGNOSIS — H35372 Puckering of macula, left eye: Secondary | ICD-10-CM | POA: Insufficient documentation

## 2022-08-12 DIAGNOSIS — H35351 Cystoid macular degeneration, right eye: Secondary | ICD-10-CM | POA: Insufficient documentation

## 2022-08-12 DIAGNOSIS — H35371 Puckering of macula, right eye: Secondary | ICD-10-CM | POA: Diagnosis not present

## 2022-08-12 DIAGNOSIS — E119 Type 2 diabetes mellitus without complications: Secondary | ICD-10-CM | POA: Insufficient documentation

## 2022-08-12 DIAGNOSIS — H3521 Other non-diabetic proliferative retinopathy, right eye: Secondary | ICD-10-CM | POA: Diagnosis not present

## 2022-08-12 DIAGNOSIS — D573 Sickle-cell trait: Secondary | ICD-10-CM | POA: Diagnosis not present

## 2022-08-19 ENCOUNTER — Ambulatory Visit (INDEPENDENT_AMBULATORY_CARE_PROVIDER_SITE_OTHER): Payer: Medicare Other | Admitting: Adult Health

## 2022-08-19 ENCOUNTER — Encounter: Payer: Self-pay | Admitting: Adult Health

## 2022-08-19 VITALS — BP 134/82 | HR 85 | Temp 97.1°F | Ht 61.0 in | Wt 164.2 lb

## 2022-08-19 DIAGNOSIS — E1142 Type 2 diabetes mellitus with diabetic polyneuropathy: Secondary | ICD-10-CM | POA: Diagnosis not present

## 2022-08-19 DIAGNOSIS — K5901 Slow transit constipation: Secondary | ICD-10-CM | POA: Diagnosis not present

## 2022-08-19 DIAGNOSIS — I10 Essential (primary) hypertension: Secondary | ICD-10-CM

## 2022-08-19 MED ORDER — POLYETHYLENE GLYCOL 3350 17 GM/SCOOP PO POWD: 17.0000 g | Freq: Every day | ORAL | 1 refills | Status: DC

## 2022-08-19 MED ORDER — SACCHAROMYCES BOULARDII 250 MG PO CAPS: 250.0000 mg | ORAL_CAPSULE | Freq: Every day | ORAL | 3 refills | Status: DC

## 2022-08-19 NOTE — Patient Instructions (Addendum)
-   Drink at least 2 L water/day -  Exercise regularly Constipation, Adult Constipation is when a person has trouble pooping (having a bowel movement). When you have this condition, you may poop fewer than 3 times a week. Your poop (stool) may also be dry, hard, or bigger than normal. Follow these instructions at home: Eating and drinking  Eat foods that have a lot of fiber, such as: Fresh fruits and vegetables. Whole grains. Beans. Eat less of foods that are low in fiber and high in fat and sugar, such as: [place] fries. Hamburgers. Cookies. Candy. Soda. Drink enough fluid to keep your pee (urine) pale yellow. General instructions Exercise regularly or as told by your doctor. Try to do 150 minutes of exercise each week. Go to the restroom when you feel like you need to poop. Do not hold it in. Take over-the-counter and prescription medicines only as told by your doctor. These include any fiber supplements. When you poop: Do deep breathing while relaxing your lower belly (abdomen). Relax your pelvic floor. The pelvic floor is a group of muscles that support the rectum, bladder, and intestines (as well as the uterus in women). Watch your condition for any changes. Tell your doctor if you notice any. Keep all follow-up visits as told by your doctor. This is important. Contact a doctor if: You have pain that gets worse. You have a fever. You have not pooped for 4 days. You vomit. You are not hungry. You lose weight. You are bleeding from the opening of the butt (anus). You have thin, pencil-like poop. Get help right away if: You have a fever, and your symptoms suddenly get worse. You leak poop or have blood in your poop. Your belly feels hard or bigger than normal (bloated). You have very bad belly pain. You feel dizzy or you faint. Summary Constipation is when a person poops fewer than 3 times a week, has trouble pooping, or has poop that is dry, hard, or bigger than  normal. Eat foods that have a lot of fiber. Drink enough fluid to keep your pee (urine) pale yellow. Take over-the-counter and prescription medicines only as told by your doctor. These include any fiber supplements. This information is not intended to replace advice given to you by your health care provider. Make sure you discuss any questions you have with your health care provider. Document Revised: 10/17/2019 Document Reviewed: 10/17/2019 Elsevier Patient Education  [place].

## 2022-08-19 NOTE — Progress Notes (Signed)
[facility]  Provider:  Durenda Age DNP  Code Status: Full Code  Goals of Care:     07/09/2022   10:25 AM  Advanced Directives  Does Patient Have a Medical Advance Directive? No  Would patient like information on creating a medical advance directive? No - Patient declined     Chief Complaint  Patient presents with   Acute Visit    Complains of Constipation and gas with Nausea. Last BM 2 days ago.     HPI: Patient is a 81 y.o. female seen today for an acute visit for constipation X 3 days. She has a PMH of asthma, diverticulitis of colon, type 2 diabetes mellitus and hypertension. She feels bloated with occasional nausea. She tried using Mylanta, MOM and Gas-X but did not work. CBG yesterday was 118. She takes Januvia 25 mg daily for diabetes mellitus. BP today 134/82. She takes Amlodipine 5 mg daily and Coreg 12.5 mg daily for hypertension.  Past Medical History:  Diagnosis Date   Acute upper respiratory infections of unspecified site    Acute upper respiratory infections of unspecified site    Acute upper respiratory infections of unspecified site    Allergic rhinitis due to pollen    Candidiasis of mouth    Disorder of bone and cartilage, unspecified    Diverticulitis of colon (without mention of hemorrhage)(562.11)    Essential hypertension, benign    Essential hypertension, benign    Extrinsic asthma, unspecified    Herpes simplex with other ophthalmic complications    Other abnormal glucose    Other and unspecified hyperlipidemia    Other and unspecified hyperlipidemia    Other and unspecified hyperlipidemia    Other cataract    Other cataract    Other dystrophy of vulva    Other malaise and fatigue    Rash and other nonspecific skin eruption    Reflux esophagitis    Thrombocytopenia, unspecified (HCC)    Type II or unspecified type diabetes mellitus without mention of complication, not stated as uncontrolled    Type II or unspecified type diabetes  mellitus without mention of complication, not stated as uncontrolled    Unspecified disorder of skin and subcutaneous tissue    Unspecified essential hypertension    Unspecified essential hypertension     Past Surgical History:  Procedure Laterality Date   ABDOMINAL HYSTERECTOMY     CESAREAN SECTION     1960,1964,1971,1972   CYST EXCISION  1974   Pilonidal Cyst excision   EYE SURGERY Right 04/14/2017   Dr. Zadie Rhine   KNEE ARTHROSCOPY Right    for meniscal tear   RETINAL TEAR REPAIR CRYOTHERAPY  10/21/2017   Getting injections in right eye    Allergies  Allergen Reactions   Crestor [Rosuvastatin Calcium]     Muscle cramps   Latex Other (See Comments)    GLOVES   Sulfa Antibiotics     Outpatient Encounter Medications as of 08/19/2022  Medication Sig   acetaminophen (TYLENOL) 325 MG tablet Take 325 mg by mouth daily as needed. For pain   albuterol (VENTOLIN HFA) 108 (90 Base) MCG/ACT inhaler Inhale 2 puffs into the lungs every 6 (six) hours as needed for wheezing or shortness of breath.   amLODipine (NORVASC) 5 MG tablet TAKE 1 TABLET(5 MG) BY MOUTH DAILY   budesonide-formoterol (SYMBICORT) 160-4.5 MCG/ACT inhaler Inhale 2 puffs into the lungs 2 (two) times daily. PLEASE GIVE PATIENT 3 MONTH SUPPLY.   calcium-vitamin D (OSCAL WITH D) 500-200  MG-UNIT per tablet Take 1 tablet by mouth 2 (two) times daily.   carvedilol (COREG) 12.5 MG tablet TAKE 1 TABLET ONCE DAILY FOR BLOOD PRESSURE   ELDERBERRY PO Take by mouth 3 (three) times a week.    ezetimibe (ZETIA) 10 MG tablet Take 1 tablet (10 mg total) by mouth daily.   fexofenadine (ALLEGRA) 180 MG tablet Take 180 mg by mouth as needed.    ketorolac (ACULAR) 0.5 % ophthalmic solution Place 1 drop into the right eye 4 (four) times daily.   losartan (COZAAR) 100 MG tablet Take 1 tablet (100 mg total) by mouth daily.   Magnesium Oxide 500 MG CAPS Take 2 capsules by mouth at bedtime.   mometasone (NASONEX) 50 MCG/ACT nasal spray One Spray  each Nostril twice daily as needed for allergies.   Multiple Vitamins-Minerals (ZINC PO) Take 1 tablet by mouth once a week.   phenylephrine (,USE FOR PREPARATION-H,) 0.25 % suppository Place 1 suppository rectally as needed for hemorrhoids.   prednisoLONE acetate (PRED FORTE) 1 % ophthalmic suspension Place 1 drop into the right eye 4 (four) times daily.    simethicone (GAS-X) 80 MG chewable tablet Chew 1 tablet (80 mg total) by mouth every 6 (six) hours as needed for flatulence.   sitaGLIPtin (JANUVIA) 25 MG tablet TAKE 1 TABLET(25 MG) BY MOUTH DAILY   sodium chloride (OCEAN) 0.65 % SOLN nasal spray Place 1 spray into both nostrils as needed for congestion.   No facility-administered encounter medications on file as of 08/19/2022.    Review of Systems:  Review of Systems  Constitutional:  Negative for appetite change, chills, fatigue and fever.  HENT:  Negative for congestion, hearing loss, rhinorrhea and sore throat.   Eyes: Negative.   Respiratory:  Negative for cough, shortness of breath and wheezing.   Cardiovascular:  Negative for chest pain, palpitations and leg swelling.  Gastrointestinal:  Positive for abdominal pain, constipation and nausea. Negative for diarrhea and vomiting.  Genitourinary:  Negative for dysuria.  Musculoskeletal:  Negative for arthralgias, back pain and myalgias.  Skin:  Negative for color change, rash and wound.  Neurological:  Negative for dizziness, weakness and headaches.  Psychiatric/Behavioral:  Negative for behavioral problems. The patient is not nervous/anxious.     Health Maintenance  Topic Date Due   COVID-19 Vaccine (6 - Pfizer series) 12/04/2021   TETANUS/TDAP  02/16/2022   INFLUENZA VACCINE  07/13/2022   OPHTHALMOLOGY EXAM  10/13/2022   FOOT EXAM  11/03/2022   HEMOGLOBIN A1C  01/05/2023   Pneumonia Vaccine 59+ Years old  Completed   DEXA SCAN  Completed   Zoster Vaccines- Shingrix  Completed   HPV VACCINES  Aged Out    Physical  Exam: Vitals:   08/19/22 1455  BP: 134/82  Pulse: 85  Temp: (!) 97.1 F (36.2 C)  TempSrc: Skin  SpO2: 96%  Weight: 164 lb 3.2 oz (74.5 kg)  Height: '5\' 1"'$  (1.549 m)   Body mass index is 31.03 kg/m. Physical Exam Constitutional:      General: She is not in acute distress.    Appearance: She is obese.  HENT:     Head: Normocephalic and atraumatic.     Nose: Nose normal.     Mouth/Throat:     Mouth: Mucous membranes are moist.  Eyes:     Conjunctiva/sclera: Conjunctivae normal.  Cardiovascular:     Rate and Rhythm: Normal rate and regular rhythm.  Pulmonary:     Effort: Pulmonary effort is normal.  Breath sounds: Normal breath sounds.  Abdominal:     General: Bowel sounds are normal.     Palpations: Abdomen is soft.  Musculoskeletal:        General: Normal range of motion.     Cervical back: Normal range of motion.  Skin:    General: Skin is warm and dry.  Neurological:     General: No focal deficit present.     Mental Status: She is alert and oriented to person, place, and time.  Psychiatric:        Mood and Affect: Mood normal.        Behavior: Behavior normal.        Thought Content: Thought content normal.        Judgment: Judgment normal.     Labs reviewed: Basic Metabolic Panel: Recent Labs    12/29/21 0854 07/05/22 0940  NA 139 140  K 3.9 4.4  CL 105 106  CO2 29 24  GLUCOSE 120* 132*  BUN 13 14  CREATININE 0.69 0.74  CALCIUM 9.3 9.3  TSH 2.13  --    Liver Function Tests: Recent Labs    12/29/21 0854 07/05/22 0940  AST 14 12  ALT 14 13  BILITOT 0.4 0.6  PROT 7.6 7.3   No results for input(s): "LIPASE", "AMYLASE" in the last 8760 hours. No results for input(s): "AMMONIA" in the last 8760 hours. CBC: Recent Labs    12/29/21 0854 07/05/22 0940  WBC 6.2 5.9  NEUTROABS 2,251 2,419  HGB 13.7 14.1  HCT 41.7 42.2  MCV 78.7* 77.0*  PLT 164 180   Lipid Panel: Recent Labs    12/29/21 0854 07/05/22 0940  CHOL 178 156  HDL 62 52   LDLCALC 101* 90  TRIG 63 58  CHOLHDL 2.9 3.0   Lab Results  Component Value Date   HGBA1C 6.2 (H) 07/05/2022    Procedures since last visit: No results found.  Assessment/Plan  1. Slow transit constipation - polyethylene glycol powder (GLYCOLAX/MIRALAX) 17 GM/SCOOP powder; Take 17 g by mouth daily.  Dispense: 3350 g; Refill: 1 - saccharomyces boulardii (FLORASTOR) 250 MG capsule; Take 1 capsule (250 mg total) by mouth daily.  Dispense: 30 capsule; Refill: 3  2. Type 2 diabetes mellitus with diabetic polyneuropathy, without long-term current use of insulin (HCC) Lab Results  Component Value Date   HGBA1C 6.2 (H) 07/05/2022   -  stable, continue Januvia -  monitor CBG daily and record  3. Essential hypertension -Blood pressure well controlled Continue current medications     Labs/tests ordered:  None  Next appt:  01/06/2023

## 2022-08-20 ENCOUNTER — Ambulatory Visit: Payer: Medicare Other | Admitting: Adult Health

## 2022-08-23 ENCOUNTER — Ambulatory Visit (INDEPENDENT_AMBULATORY_CARE_PROVIDER_SITE_OTHER): Payer: Medicare Other

## 2022-08-23 DIAGNOSIS — Z23 Encounter for immunization: Secondary | ICD-10-CM

## 2022-08-24 ENCOUNTER — Telehealth: Payer: Self-pay | Admitting: Podiatry

## 2022-08-24 NOTE — Telephone Encounter (Signed)
LVM for pt to call back regarding DM shoes and inserts. Script put in by [employer] only reads inserts so I was calling to confirm or change the order on the form, as well as inform her that I haven't gotten it back.

## 2022-08-28 ENCOUNTER — Encounter: Payer: Self-pay | Admitting: Podiatry

## 2022-08-28 ENCOUNTER — Ambulatory Visit (INDEPENDENT_AMBULATORY_CARE_PROVIDER_SITE_OTHER): Payer: Medicare Other | Admitting: Podiatry

## 2022-08-28 ENCOUNTER — Telehealth: Payer: Self-pay | Admitting: *Deleted

## 2022-08-28 DIAGNOSIS — B351 Tinea unguium: Secondary | ICD-10-CM | POA: Diagnosis not present

## 2022-08-28 DIAGNOSIS — M79675 Pain in left toe(s): Secondary | ICD-10-CM

## 2022-08-28 DIAGNOSIS — M79674 Pain in right toe(s): Secondary | ICD-10-CM | POA: Diagnosis not present

## 2022-08-28 DIAGNOSIS — L84 Corns and callosities: Secondary | ICD-10-CM

## 2022-08-28 DIAGNOSIS — E119 Type 2 diabetes mellitus without complications: Secondary | ICD-10-CM | POA: Diagnosis not present

## 2022-08-28 NOTE — Telephone Encounter (Signed)
Tanya Bell was here today for an appointment.  She inquired about the status of her shoes.  She saw [name] on 05/19/2022 and got measured for shoes.  She stated she has made several attempts to check on the status and has not received a call back.  I looked on the [employer] website and there was not a request submitted for her this year.  I told her I would get Christan to follow-up on the matter and get her to give her a call back.  I explained the transition problems we have been having in that department and apologized for the delay.

## 2022-08-30 ENCOUNTER — Telehealth: Payer: Self-pay

## 2022-08-30 NOTE — Telephone Encounter (Signed)
Patient called and stated that she was waiting on diabetic shoes. She states that she was told that she had not received them yet because the order wasn't filled out correctly by provider. I called [employer] the company that supplies the shoes and who the order was faxed to. They stated that order was canceled in July due to not receiving the necessary information from our office. They will contact [employer] and have them fax paperwork again. When paperwork is sent in we need to be sure to have provider sign it and send necessary notes. Patient states she desperately needs her shoes.

## 2022-09-01 ENCOUNTER — Ambulatory Visit (INDEPENDENT_AMBULATORY_CARE_PROVIDER_SITE_OTHER): Payer: Medicare Other | Admitting: Family

## 2022-09-01 ENCOUNTER — Encounter: Payer: Self-pay | Admitting: Family

## 2022-09-01 VITALS — BP 120/88 | HR 63 | Temp 96.8°F | Resp 16 | Ht 61.0 in | Wt 163.0 lb

## 2022-09-01 DIAGNOSIS — K5901 Slow transit constipation: Secondary | ICD-10-CM

## 2022-09-01 DIAGNOSIS — R21 Rash and other nonspecific skin eruption: Secondary | ICD-10-CM

## 2022-09-01 MED ORDER — SENNOSIDES-DOCUSATE SODIUM 8.6-50 MG PO TABS: 1.0000 | ORAL_TABLET | Freq: Every day | ORAL | 3 refills | Status: DC

## 2022-09-01 MED ORDER — LINACLOTIDE 145 MCG PO CAPS: 145.0000 ug | ORAL_CAPSULE | Freq: Every day | ORAL | 0 refills | Status: DC

## 2022-09-01 MED ORDER — PANTOPRAZOLE SODIUM 40 MG PO TBEC: 40.0000 mg | DELAYED_RELEASE_TABLET | Freq: Every day | ORAL | 3 refills | Status: DC

## 2022-09-01 NOTE — Patient Instructions (Signed)

## 2022-09-01 NOTE — Progress Notes (Signed)
Provider: Marlowe Sax FNP-C  Lamarius Dirr, Nelda Bucks, NP  Patient Care Team: Keyunna Coco, Nelda Bucks, NP as PCP - General (Family Medicine) Teena Irani, MD (Inactive) as Consulting Physician (Gastroenterology) Warden Fillers, MD as Consulting Physician (Ophthalmology) [name], MD as Consulting Physician (Ophthalmology) Mickel Fuchs, MD as Referring Physician (Psychiatry) Velora Heckler (Inactive) (Orthotics)  Extended Emergency Contact Information Primary Emergency Contact: Sheridan Phone: [phone] Relation: None  Code Status:  Full Code  Goals of care: Advanced Directive information    09/01/2022    2:33 PM  Advanced Directives  Does Patient Have a Medical Advance Directive? No  Would patient like information on creating a medical advance directive? No - Patient declined     Chief Complaint  Patient presents with   Acute Visit    Patient complains of stomach pains and feeling bad. Patient states that she has bump on Right buttock. Patient states that Mirlax didn't work well for her.     HPI:  Pt is a 81 y.o. female seen today for an acute visit for evaluation of constipation.states feels bloated with generalized pain at times.Has taken miralax that was advised by Monina-Vargus,NP.Had bowel movement but seemed small compared to her regular bowel movements.She denies any fever, chills nausea, vomiting, abdominal distention or blood in the stool.   Last bowel movement this morning.  Also complains of right buttock bump which started as a pimple but now had rounded.No drainage.     Past Medical History:  Diagnosis Date   Acute upper respiratory infections of unspecified site    Acute upper respiratory infections of unspecified site    Acute upper respiratory infections of unspecified site    Allergic rhinitis due to pollen    Candidiasis of mouth    Disorder of bone and cartilage, unspecified    Diverticulitis of colon (without mention of  hemorrhage)(562.11)    Essential hypertension, benign    Essential hypertension, benign    Extrinsic asthma, unspecified    Herpes simplex with other ophthalmic complications    Other abnormal glucose    Other and unspecified hyperlipidemia    Other and unspecified hyperlipidemia    Other and unspecified hyperlipidemia    Other cataract    Other cataract    Other dystrophy of vulva    Other malaise and fatigue    Rash and other nonspecific skin eruption    Reflux esophagitis    Thrombocytopenia, unspecified (HCC)    Type II or unspecified type diabetes mellitus without mention of complication, not stated as uncontrolled    Type II or unspecified type diabetes mellitus without mention of complication, not stated as uncontrolled    Unspecified disorder of skin and subcutaneous tissue    Unspecified essential hypertension    Unspecified essential hypertension    Past Surgical History:  Procedure Laterality Date   ABDOMINAL HYSTERECTOMY     CESAREAN SECTION     [phone]   CYST EXCISION  1974   Pilonidal Cyst excision   EYE SURGERY Right 04/14/2017   Dr. Zadie Rhine   KNEE ARTHROSCOPY Right    for meniscal tear   RETINAL TEAR REPAIR CRYOTHERAPY  10/21/2017   Getting injections in right eye    Allergies  Allergen Reactions   Crestor [Rosuvastatin Calcium]     Muscle cramps   Latex Other (See Comments)    GLOVES   Sulfa Antibiotics     Outpatient Encounter Medications as of 09/01/2022  Medication Sig   acetaminophen (TYLENOL) 325 MG  tablet Take 325 mg by mouth daily as needed. For pain   albuterol (VENTOLIN HFA) 108 (90 Base) MCG/ACT inhaler Inhale 2 puffs into the lungs every 6 (six) hours as needed for wheezing or shortness of breath.   amLODipine (NORVASC) 5 MG tablet TAKE 1 TABLET(5 MG) BY MOUTH DAILY   budesonide-formoterol (SYMBICORT) 160-4.5 MCG/ACT inhaler Inhale 2 puffs into the lungs 2 (two) times daily. PLEASE GIVE PATIENT 3 MONTH SUPPLY.   calcium-vitamin  D (OSCAL WITH D) 500-200 MG-UNIT per tablet Take 1 tablet by mouth 2 (two) times daily.   carvedilol (COREG) 12.5 MG tablet TAKE 1 TABLET ONCE DAILY FOR BLOOD PRESSURE   ELDERBERRY PO Take by mouth 3 (three) times a week.    ezetimibe (ZETIA) 10 MG tablet Take 1 tablet (10 mg total) by mouth daily.   fexofenadine (ALLEGRA) 180 MG tablet Take 180 mg by mouth as needed.    ketorolac (ACULAR) 0.5 % ophthalmic solution Place 1 drop into the right eye 4 (four) times daily.   losartan (COZAAR) 100 MG tablet Take 1 tablet (100 mg total) by mouth daily.   Magnesium Oxide 500 MG CAPS Take 2 capsules by mouth at bedtime.   mometasone (NASONEX) 50 MCG/ACT nasal spray One Spray each Nostril twice daily as needed for allergies.   Multiple Vitamins-Minerals (ZINC PO) Take 1 tablet by mouth once a week.   phenylephrine (,USE FOR PREPARATION-H,) 0.25 % suppository Place 1 suppository rectally as needed for hemorrhoids.   prednisoLONE acetate (PRED FORTE) 1 % ophthalmic suspension Place 1 drop into the right eye 4 (four) times daily.    saccharomyces boulardii (FLORASTOR) 250 MG capsule Take 1 capsule (250 mg total) by mouth daily.   simethicone (GAS-X) 80 MG chewable tablet Chew 1 tablet (80 mg total) by mouth every 6 (six) hours as needed for flatulence.   sitaGLIPtin (JANUVIA) 25 MG tablet TAKE 1 TABLET(25 MG) BY MOUTH DAILY   sodium chloride (OCEAN) 0.65 % SOLN nasal spray Place 1 spray into both nostrils as needed for congestion.   [DISCONTINUED] polyethylene glycol powder (GLYCOLAX/MIRALAX) 17 GM/SCOOP powder Take 17 g by mouth daily.   No facility-administered encounter medications on file as of 09/01/2022.    Review of Systems  Constitutional:  Negative for appetite change, chills, fatigue, fever and unexpected weight change.  Respiratory:  Negative for cough, chest tightness, shortness of breath and wheezing.   Cardiovascular:  Negative for chest pain, palpitations and leg swelling.   Gastrointestinal:  Positive for abdominal pain and constipation. Negative for abdominal distention, blood in stool, diarrhea, nausea and vomiting.       Bloating   Genitourinary:  Negative for difficulty urinating, dysuria, flank pain, frequency and urgency.  Musculoskeletal:  Negative for arthralgias, back pain, gait problem, joint swelling, myalgias, neck pain and neck stiffness.  Skin:  Negative for color change, pallor and rash.       " Bump" on right buttock     Immunization History  Administered Date(s) Administered   Fluad Quad(high Dose 65+) 08/29/2019, 09/02/2020, 09/28/2021, 08/23/2022   Influenza Split 08/25/2010, 08/26/2011, 10/04/2012   Influenza Whole 09/24/2009   Influenza, High Dose Seasonal PF 08/16/2018   Influenza,inj,Quad PF,6+ Mos 09/11/2013, 09/04/2014, 11/12/2015, 10/04/2016, 09/06/2017   Influenza-Unspecified 09/02/2018   PFIZER(Purple Top)SARS-COV-2 Vaccination 02/11/2020, 03/03/2020, 09/17/2020, 07/04/2021, 10/09/2021   Pneumococcal Conjugate-13 02/17/2012   Pneumococcal Polysaccharide-23 05/19/2016   Tdap 02/17/2012   Zoster Recombinat (Shingrix) 10/15/2021, 02/02/2022   Pertinent  Health Maintenance Due  Topic Date Due  OPHTHALMOLOGY EXAM  10/13/2022   FOOT EXAM  11/03/2022   HEMOGLOBIN A1C  01/05/2023   INFLUENZA VACCINE  Completed   DEXA SCAN  Completed      06/26/2021    9:16 AM 01/01/2022    9:38 AM 01/14/2022    9:28 AM 07/09/2022   10:25 AM 09/01/2022    2:33 PM  Fall Risk  Falls in the past year? 0 0 0 0 0  Was there an injury with Fall? 0 0 0 0 0  Fall Risk Category Calculator 0 0 0 0 0  Fall Risk Category Low Low Low Low Low  Patient Fall Risk Level Low fall risk Low fall risk Low fall risk Low fall risk Low fall risk  Patient at Risk for Falls Due to No Fall Risks No Fall Risks No Fall Risks No Fall Risks No Fall Risks  Fall risk Follow up Falls evaluation completed Falls evaluation completed Falls evaluation completed Falls evaluation  completed Falls evaluation completed   Functional Status Survey:    Vitals:   09/01/22 1426  BP: 120/88  Pulse: 63  Resp: 16  Temp: (!) 96.8 F (36 C)  SpO2: 94%  Weight: 163 lb (73.9 kg)  Height: '5\' 1"'$  (1.549 m)   Body mass index is 30.8 kg/m. Physical Exam Vitals reviewed.  Constitutional:      General: She is not in acute distress.    Appearance: Normal appearance. She is obese. She is not ill-appearing or diaphoretic.  Eyes:     General: No scleral icterus.       Right eye: No discharge.        Left eye: No discharge.     Conjunctiva/sclera: Conjunctivae normal.     Pupils: Pupils are equal, round, and reactive to light.  Cardiovascular:     Rate and Rhythm: Normal rate and regular rhythm.     Pulses: Normal pulses.     Heart sounds: Normal heart sounds. No murmur heard.    No friction rub. No gallop.  Pulmonary:     Effort: Pulmonary effort is normal. No respiratory distress.     Breath sounds: Normal breath sounds. No wheezing, rhonchi or rales.  Chest:     Chest wall: No tenderness.  Abdominal:     General: Bowel sounds are normal. There is no distension.     Palpations: Abdomen is soft. There is no mass.     Tenderness: There is no abdominal tenderness. There is no right CVA tenderness, left CVA tenderness, guarding or rebound.  Skin:    General: Skin is warm and dry.     Coloration: Skin is not pale.     Findings: No bruising, erythema, lesion or rash.     Comments: Dime size hard area non-tender to palpation without any erythema or drainage noted on right gluteal fold.   Neurological:     Mental Status: She is alert and oriented to person, place, and time.     Gait: Gait normal.  Psychiatric:        Mood and Affect: Mood normal.        Speech: Speech normal.        Behavior: Behavior normal.     Labs reviewed: Recent Labs    12/29/21 0854 07/05/22 0940  NA 139 140  K 3.9 4.4  CL 105 106  CO2 29 24  GLUCOSE 120* 132*  BUN 13 14  CREATININE  0.69 0.74  CALCIUM 9.3 9.3   Recent Labs  12/29/21 0854 07/05/22 0940  AST 14 12  ALT 14 13  BILITOT 0.4 0.6  PROT 7.6 7.3   Recent Labs    12/29/21 0854 07/05/22 0940  WBC 6.2 5.9  NEUTROABS 2,251 2,419  HGB 13.7 14.1  HCT 41.7 42.2  MCV 78.7* 77.0*  PLT 164 180   Lab Results  Component Value Date   TSH 2.13 12/29/2021   Lab Results  Component Value Date   HGBA1C 6.2 (H) 07/05/2022   Lab Results  Component Value Date   CHOL 156 07/05/2022   HDL 52 07/05/2022   LDLCALC 90 07/05/2022   TRIG 58 07/05/2022   CHOLHDL 3.0 07/05/2022    Significant Diagnostic Results in last 30 days:  No results found.  Assessment/Plan  1. Slow transit constipation Abdomen soft,non tender,non distended with x 4 quadrant bowel sounds present. -Increase fluid and fiber -Start on Linzess and Senokot.  Advised to hold if she develops loose stool -Recommend follow-up with GI if symptoms worsen - linaclotide (LINZESS) 145 MCG CAPS capsule; Take 1 capsule (145 mcg total) by mouth daily before breakfast.  Dispense: 30 capsule; Refill: 0 - pantoprazole (PROTONIX) 40 MG tablet; Take 1 tablet (40 mg total) by mouth daily.  Dispense: 30 tablet; Refill: 3 - senna-docusate (SENOKOT-S) 8.6-50 MG tablet; Take 1 tablet by mouth at bedtime.  Dispense: 30 tablet; Refill: 3  2. Rash and nonspecific skin eruption Afebrile Dime size hard area non-tender to palpation without any erythema or drainage noted on right gluteal fold.   Family/ staff Communication: Reviewed plan of care with patient verbalized understanding  Labs/tests ordered: None   Next Appointment: Return if symptoms worsen or fail to improve.   Sandrea Hughs, NP

## 2022-09-03 NOTE — Progress Notes (Signed)
  Subjective:  Patient ID: Tanya Bell, female    DOB: 1941-06-04,  MRN: [id number]  Tanya Bell presents to clinic today for preventative diabetic foot care and callus(es) both feet and painful thick toenails that are difficult to trim. Painful toenails interfere with ambulation. Aggravating factors include wearing enclosed shoe gear. Pain is relieved with periodic professional debridement. Painful calluses are aggravated when weightbearing with and without shoegear. Pain is relieved with periodic professional debridement.  She relates no new pedal concerns on today's visit.  PCP is Ngetich, Nelda Bucks, NP , and last visit was July 09, 2022.  Allergies  Allergen Reactions   Crestor [Rosuvastatin Calcium]     Muscle cramps   Latex Other (See Comments)    GLOVES   Sulfa Antibiotics     Review of Systems: Negative except as noted in the HPI.  Objective: No changes noted in today's physical examination. Tanya Bell is a pleasant 81 y.o. female in NAD. AAO x 3.  Vascular Examination: Palpable pedal pulses b/l LE. Digital hair present b/l. No pedal edema b/l. Skin temperature gradient WNL b/l. No varicosities b/l. No pain with calf compression b/l. Lower extremity skin temperature gradient within normal limits. No edema noted b/l LE. No cyanosis or clubbing noted b/l LE.Marland Kitchen  Dermatological Examination: Pedal skin with normal turgor, texture and tone b/l. No open wounds. No interdigital macerations b/l. Toenails 1-4 b/l thickened, discolored, dystrophic with subungual debris. There is pain on palpation to dorsal aspect of nailplates. Anonychia noted bilateral 5th toes. Nailbed(s) epithelialized.  Hyperkeratotic lesion(s) submet head 1 b/l and submet head 5 b/l.  No erythema, no edema, no drainage, no fluctuance..  Neurological Examination: Protective sensation intact with 10 gram monofilament b/l LE. Vibratory sensation intact b/l LE.   Musculoskeletal  Examination: Muscle strength 5/5 to all LE muscle groups b/l. HAV with bunion deformity noted b/l LE. Hammertoe(s) noted to the bilateral 5th toes.     Latest Ref Rng & Units 07/05/2022    9:40 AM 12/29/2021    8:54 AM  Hemoglobin A1C  Hemoglobin-A1c <5.7 % of total Hgb 6.2  6.5    Assessment/Plan: 1. Pain due to onychomycosis of toenails of both feet   2. Callus   3. Controlled type 2 diabetes mellitus without complication, without long-term current use of insulin ([place])      -Examined patient. -Patient to continue soft, supportive shoe gear daily. -Toenails 1-4 bilaterally debrided in length and girth without iatrogenic bleeding with sterile nail nipper and dremel.  -Callus(es) submet head 1 b/l and submet head 5 b/l pared utilizing sterile scalpel blade without complication or incident. Total number debrided =4. -Patient/POA to call should there be question/concern in the interim.   Return in about 3 months (around 11/27/2022).  Marzetta Board, DPM

## 2022-09-08 ENCOUNTER — Ambulatory Visit (INDEPENDENT_AMBULATORY_CARE_PROVIDER_SITE_OTHER): Payer: Medicare Other | Admitting: Family

## 2022-09-08 ENCOUNTER — Encounter: Payer: Self-pay | Admitting: Family

## 2022-09-08 VITALS — BP 142/70 | HR 59 | Temp 97.3°F | Resp 16 | Ht 61.0 in | Wt 164.2 lb

## 2022-09-08 DIAGNOSIS — L0231 Cutaneous abscess of buttock: Secondary | ICD-10-CM | POA: Diagnosis not present

## 2022-09-08 MED ORDER — DOXYCYCLINE HYCLATE 100 MG PO TABS: 100.0000 mg | ORAL_TABLET | Freq: Two times a day (BID) | ORAL | 0 refills | Status: AC

## 2022-09-08 NOTE — Patient Instructions (Signed)
cleanse with dial soap and warm water,pat dry with a wash cloth then apply deystin ointment once daily and as needed  - Notify provider if symptoms worsen

## 2022-09-08 NOTE — Progress Notes (Signed)
Provider: Marlowe Sax FNP-C  Elsa Ploch, Nelda Bucks, NP  Patient Care Team: Makenzie Weisner, Nelda Bucks, NP as PCP - General (Family Medicine) Teena Irani, MD (Inactive) as Consulting Physician (Gastroenterology) Warden Fillers, MD as Consulting Physician (Ophthalmology) [name], MD as Consulting Physician (Ophthalmology) Mickel Fuchs, MD as Referring Physician (Psychiatry) Velora Heckler (Inactive) (Orthotics)  Extended Emergency Contact Information Primary Emergency Contact: [place] Phone: [phone] Relation: None  Code Status:  Full Code  Goals of care: Advanced Directive information    09/08/2022    2:18 PM  Advanced Directives  Does Patient Have a Medical Advance Directive? No  Would patient like information on creating a medical advance directive? No - Patient declined     Chief Complaint  Patient presents with   Acute Visit    Patient complains that bump on right buttock burst. Patient states that discharge is red.    HPI:  Pt is a 81 y.o. female seen today for an acute visit for evaluation of bump on right gluteal area.states bursted few days ago.Has been draining pink -reddish small drainage noted on the pad that she wear.denies any fever or chills.she is concerned since she has hx of diabetes and sickle cell trai that wound does not heal without antibiotics.    Past Medical History:  Diagnosis Date   Acute upper respiratory infections of unspecified site    Acute upper respiratory infections of unspecified site    Acute upper respiratory infections of unspecified site    Allergic rhinitis due to pollen    Candidiasis of mouth    Disorder of bone and cartilage, unspecified    Diverticulitis of colon (without mention of hemorrhage)(562.11)    Essential hypertension, benign    Essential hypertension, benign    Extrinsic asthma, unspecified    Herpes simplex with other ophthalmic complications    Other abnormal glucose    Other and unspecified  hyperlipidemia    Other and unspecified hyperlipidemia    Other and unspecified hyperlipidemia    Other cataract    Other cataract    Other dystrophy of vulva    Other malaise and fatigue    Rash and other nonspecific skin eruption    Reflux esophagitis    Thrombocytopenia, unspecified (HCC)    Type II or unspecified type diabetes mellitus without mention of complication, not stated as uncontrolled    Type II or unspecified type diabetes mellitus without mention of complication, not stated as uncontrolled    Unspecified disorder of skin and subcutaneous tissue    Unspecified essential hypertension    Unspecified essential hypertension    Past Surgical History:  Procedure Laterality Date   ABDOMINAL HYSTERECTOMY     CESAREAN SECTION     1960,1964,1971,1972   CYST EXCISION  1974   Pilonidal Cyst excision   EYE SURGERY Right 04/14/2017   Dr. Zadie Rhine   KNEE ARTHROSCOPY Right    for meniscal tear   RETINAL TEAR REPAIR CRYOTHERAPY  10/21/2017   Getting injections in right eye    Allergies  Allergen Reactions   Crestor [Rosuvastatin Calcium]     Muscle cramps   Latex Other (See Comments)    GLOVES   Sulfa Antibiotics     Outpatient Encounter Medications as of 09/08/2022  Medication Sig   acetaminophen (TYLENOL) 325 MG tablet Take 325 mg by mouth daily as needed. For pain   albuterol (VENTOLIN HFA) 108 (90 Base) MCG/ACT inhaler Inhale 2 puffs into the lungs every 6 (six) hours as  needed for wheezing or shortness of breath.   amLODipine (NORVASC) 5 MG tablet TAKE 1 TABLET(5 MG) BY MOUTH DAILY   budesonide-formoterol (SYMBICORT) 160-4.5 MCG/ACT inhaler Inhale 2 puffs into the lungs 2 (two) times daily. PLEASE GIVE PATIENT 3 MONTH SUPPLY.   calcium-vitamin D (OSCAL WITH D) 500-200 MG-UNIT per tablet Take 1 tablet by mouth 2 (two) times daily.   carvedilol (COREG) 12.5 MG tablet TAKE 1 TABLET ONCE DAILY FOR BLOOD PRESSURE   ELDERBERRY PO Take by mouth 3 (three) times a week.     ezetimibe (ZETIA) 10 MG tablet Take 1 tablet (10 mg total) by mouth daily.   fexofenadine (ALLEGRA) 180 MG tablet Take 180 mg by mouth as needed.    ketorolac (ACULAR) 0.5 % ophthalmic solution Place 1 drop into the right eye 4 (four) times daily.   linaclotide (LINZESS) 145 MCG CAPS capsule Take 1 capsule (145 mcg total) by mouth daily before breakfast.   losartan (COZAAR) 100 MG tablet Take 1 tablet (100 mg total) by mouth daily.   Magnesium Oxide 500 MG CAPS Take 2 capsules by mouth at bedtime.   mometasone (NASONEX) 50 MCG/ACT nasal spray One Spray each Nostril twice daily as needed for allergies.   Multiple Vitamins-Minerals (ZINC PO) Take 1 tablet by mouth once a week.   pantoprazole (PROTONIX) 40 MG tablet Take 1 tablet (40 mg total) by mouth daily.   phenylephrine (,USE FOR PREPARATION-H,) 0.25 % suppository Place 1 suppository rectally as needed for hemorrhoids.   prednisoLONE acetate (PRED FORTE) 1 % ophthalmic suspension Place 1 drop into the right eye 4 (four) times daily.    saccharomyces boulardii (FLORASTOR) 250 MG capsule Take 1 capsule (250 mg total) by mouth daily.   senna-docusate (SENOKOT-S) 8.6-50 MG tablet Take 1 tablet by mouth at bedtime.   simethicone (GAS-X) 80 MG chewable tablet Chew 1 tablet (80 mg total) by mouth every 6 (six) hours as needed for flatulence.   sitaGLIPtin (JANUVIA) 25 MG tablet TAKE 1 TABLET(25 MG) BY MOUTH DAILY   sodium chloride (OCEAN) 0.65 % SOLN nasal spray Place 1 spray into both nostrils as needed for congestion.   No facility-administered encounter medications on file as of 09/08/2022.    Review of Systems  Constitutional:  Negative for chills, fatigue and fever.  Gastrointestinal:  Negative for abdominal distention, nausea and vomiting.  Skin:  Negative for color change, pallor and rash.       Bright buttock ump busted     Immunization History  Administered Date(s) Administered   Fluad Quad(high Dose 65+) 08/29/2019, 09/02/2020,  09/28/2021, 08/23/2022   Influenza Split 08/25/2010, 08/26/2011, 10/04/2012   Influenza Whole 09/24/2009   Influenza, High Dose Seasonal PF 08/16/2018   Influenza,inj,Quad PF,6+ Mos 09/11/2013, 09/04/2014, 11/12/2015, 10/04/2016, 09/06/2017   Influenza-Unspecified 09/02/2018   PFIZER(Purple Top)SARS-COV-2 Vaccination 02/11/2020, 03/03/2020, 09/17/2020, 07/04/2021, 10/09/2021   Pneumococcal Conjugate-13 02/17/2012   Pneumococcal Polysaccharide-23 05/19/2016   Tdap 02/17/2012   Zoster Recombinat (Shingrix) 10/15/2021, 02/02/2022   Pertinent  Health Maintenance Due  Topic Date Due   OPHTHALMOLOGY EXAM  10/13/2022   FOOT EXAM  11/03/2022   HEMOGLOBIN A1C  01/05/2023   INFLUENZA VACCINE  Completed   DEXA SCAN  Completed      01/01/2022    9:38 AM 01/14/2022    9:28 AM 07/09/2022   10:25 AM 09/01/2022    2:33 PM 09/08/2022    2:18 PM  Fall Risk  Falls in the past year? 0 0 0 0 0  Was there an injury with Fall? 0 0 0 0 0  Fall Risk Category Calculator 0 0 0 0 0  Fall Risk Category Low Low Low Low Low  Patient Fall Risk Level Low fall risk Low fall risk Low fall risk Low fall risk Low fall risk  Patient at Risk for Falls Due to No Fall Risks No Fall Risks No Fall Risks No Fall Risks No Fall Risks  Fall risk Follow up Falls evaluation completed Falls evaluation completed Falls evaluation completed Falls evaluation completed Falls evaluation completed   Functional Status Survey:    Vitals:   09/08/22 1415  BP: (!) 142/70  Pulse: (!) 59  Resp: 16  Temp: (!) 97.3 F (36.3 C)  SpO2: 96%  Weight: 164 lb 3.2 oz (74.5 kg)  Height: '5\' 1"'$  (1.549 m)   Body mass index is 31.03 kg/m. Physical Exam Vitals reviewed.  Constitutional:      General: She is not in acute distress.    Appearance: Normal appearance. She is normal weight. She is not ill-appearing or diaphoretic.  HENT:     Mouth/Throat:     Pharynx: No oropharyngeal exudate.  Eyes:     General: No scleral icterus.        Right eye: No discharge.        Left eye: No discharge.     Conjunctiva/sclera: Conjunctivae normal.     Pupils: Pupils are equal, round, and reactive to light.  Musculoskeletal:        General: No swelling or tenderness. Normal range of motion.     Right lower leg: No edema.     Left lower leg: No edema.  Skin:    General: Skin is warm and dry.     Coloration: Skin is not pale.     Findings: No erythema or rash.     Comments: Right gluteal dime size open area with 1 cm surrounding area hard to palpation.Non-tender.wound bed pink in color without any drainage.   Neurological:     Mental Status: She is alert and oriented to person, place, and time.     Gait: Gait normal.  Psychiatric:        Mood and Affect: Mood normal.        Speech: Speech normal.        Behavior: Behavior normal.     Labs reviewed: Recent Labs    12/29/21 0854 07/05/22 0940  NA 139 140  K 3.9 4.4  CL 105 106  CO2 29 24  GLUCOSE 120* 132*  BUN 13 14  CREATININE 0.69 0.74  CALCIUM 9.3 9.3   Recent Labs    12/29/21 0854 07/05/22 0940  AST 14 12  ALT 14 13  BILITOT 0.4 0.6  PROT 7.6 7.3   Recent Labs    12/29/21 0854 07/05/22 0940  WBC 6.2 5.9  NEUTROABS 2,251 2,419  HGB 13.7 14.1  HCT 41.7 42.2  MCV 78.7* 77.0*  PLT 164 180   Lab Results  Component Value Date   TSH 2.13 12/29/2021   Lab Results  Component Value Date   HGBA1C 6.2 (H) 07/05/2022   Lab Results  Component Value Date   CHOL 156 07/05/2022   HDL 52 07/05/2022   LDLCALC 90 07/05/2022   TRIG 58 07/05/2022   CHOLHDL 3.0 07/05/2022    Significant Diagnostic Results in last 30 days:  No results found.  Assessment/Plan   Abscess, gluteal, right Afebrile  Right gluteal dime size open area with  1 cm surrounding area hard to palpation.Non-tender.wound bed pink in color without any drainage.  Start on doxycycline as below.  Side effects discussed. - doxycycline (VIBRA-TABS) 100 MG tablet; Take 1 tablet (100 mg total)  by mouth 2 (two) times daily for 7 days.  Dispense: 14 tablet; Refill: 0  Family/ staff Communication: Reviewed plan of care with patient verbalized understanding  Labs/tests ordered: None   Next Appointment: Return if symptoms worsen or fail to improve.   Sandrea Hughs, NP

## 2022-09-09 ENCOUNTER — Other Ambulatory Visit: Payer: Self-pay

## 2022-09-09 DIAGNOSIS — I10 Essential (primary) hypertension: Secondary | ICD-10-CM

## 2022-09-09 MED ORDER — CARVEDILOL 12.5 MG PO TABS: ORAL_TABLET | ORAL | 3 refills | Status: DC

## 2022-09-13 ENCOUNTER — Telehealth: Payer: Self-pay

## 2022-09-13 DIAGNOSIS — I10 Essential (primary) hypertension: Secondary | ICD-10-CM

## 2022-09-13 MED ORDER — SITAGLIPTIN PHOSPHATE 25 MG PO TABS: ORAL_TABLET | ORAL | 0 refills | Status: DC

## 2022-09-13 MED ORDER — AMLODIPINE BESYLATE 5 MG PO TABS: ORAL_TABLET | ORAL | 1 refills | Status: DC

## 2022-09-13 NOTE — Telephone Encounter (Signed)
Patient called requesting refills on medication. Januvia and amlopdipine. Patient needs a limited supply of Januvia sent to [facility] on [address] until she received prescription [facility]

## 2022-09-30 ENCOUNTER — Other Ambulatory Visit: Payer: Self-pay | Admitting: *Deleted

## 2022-09-30 MED ORDER — SITAGLIPTIN PHOSPHATE 25 MG PO TABS: ORAL_TABLET | ORAL | 1 refills | Status: DC

## 2022-09-30 NOTE — Telephone Encounter (Signed)
Patient requested refill to be sent to [facility].  

## 2022-10-21 NOTE — Telephone Encounter (Signed)
Inputting new order as we have not received fax. LVM informing pt of what would take place and sent message to pcp office to inform them that a fax would be coming over.

## 2022-11-30 ENCOUNTER — Other Ambulatory Visit: Payer: Self-pay | Admitting: *Deleted

## 2022-11-30 MED ORDER — EZETIMIBE 10 MG PO TABS: 10.0000 mg | ORAL_TABLET | Freq: Every day | ORAL | 1 refills | Status: DC

## 2022-11-30 NOTE — Telephone Encounter (Signed)
Patient requested refill

## 2022-12-01 ENCOUNTER — Encounter: Payer: Self-pay | Admitting: Podiatry

## 2022-12-01 ENCOUNTER — Ambulatory Visit (INDEPENDENT_AMBULATORY_CARE_PROVIDER_SITE_OTHER): Payer: Medicare Other | Admitting: Podiatry

## 2022-12-01 ENCOUNTER — Other Ambulatory Visit: Payer: Self-pay | Admitting: Family

## 2022-12-01 DIAGNOSIS — M2041 Other hammer toe(s) (acquired), right foot: Secondary | ICD-10-CM | POA: Diagnosis not present

## 2022-12-01 DIAGNOSIS — M79675 Pain in left toe(s): Secondary | ICD-10-CM

## 2022-12-01 DIAGNOSIS — M79674 Pain in right toe(s): Secondary | ICD-10-CM | POA: Diagnosis not present

## 2022-12-01 DIAGNOSIS — M2042 Other hammer toe(s) (acquired), left foot: Secondary | ICD-10-CM | POA: Diagnosis not present

## 2022-12-01 DIAGNOSIS — B351 Tinea unguium: Secondary | ICD-10-CM

## 2022-12-01 DIAGNOSIS — L84 Corns and callosities: Secondary | ICD-10-CM

## 2022-12-01 DIAGNOSIS — E119 Type 2 diabetes mellitus without complications: Secondary | ICD-10-CM

## 2022-12-02 NOTE — Progress Notes (Signed)
ANNUAL DIABETIC FOOT EXAM  Subjective: Tanya Bell presents today for annual diabetic foot examination.  Chief Complaint  Patient presents with   Nail Problem    Dfc  BG - 97 this morning A1C - 6.2  PCP - Dr Kelby Fam last [place] 09/2022   Patient confirms h/o diabetes.  Patient denies any h/o foot wounds.  Patient denies any numbness, tingling, burning, or pins/needle sensation in feet.  Risk factors: diabetes, HTN, hyperlipidemia, h/o tobacco use in remission.  Ngetich, Nelda Bucks, NP is patient's PCP.  Past Medical History:  Diagnosis Date   Acute upper respiratory infections of unspecified site    Acute upper respiratory infections of unspecified site    Acute upper respiratory infections of unspecified site    Allergic rhinitis due to pollen    Candidiasis of mouth    Disorder of bone and cartilage, unspecified    Diverticulitis of colon (without mention of hemorrhage)(562.11)    Essential hypertension, benign    Essential hypertension, benign    Extrinsic asthma, unspecified    Herpes simplex with other ophthalmic complications    Other abnormal glucose    Other and unspecified hyperlipidemia    Other and unspecified hyperlipidemia    Other and unspecified hyperlipidemia    Other cataract    Other cataract    Other dystrophy of vulva    Other malaise and fatigue    Rash and other nonspecific skin eruption    Reflux esophagitis    Thrombocytopenia, unspecified (HCC)    Type II or unspecified type diabetes mellitus without mention of complication, not stated as uncontrolled    Type II or unspecified type diabetes mellitus without mention of complication, not stated as uncontrolled    Unspecified disorder of skin and subcutaneous tissue    Unspecified essential hypertension    Unspecified essential hypertension    Patient Active Problem List   Diagnosis Date Noted   Cystoid macular edema of right eye 08/12/2022   Epiretinal membrane (ERM) of left eye  08/12/2022   History of retinal detachment 08/12/2022   Type 2 diabetes mellitus without retinopathy ([place]) 08/12/2022   Diabetes mellitus type 2, controlled ([place]) 02/04/2015   Varicose veins of leg with edema 02/04/2015   Unspecified essential hypertension 05/07/2014   HTN (hypertension) 03/06/2013   Vaginal dryness, menopausal 03/06/2013   Eczema 03/06/2013   Annual physical exam 03/06/2013   Herpes simplex with other ophthalmic complications 25/04/3975   Disorder of bone and cartilage, unspecified 01/04/2013   Reflux esophagitis 01/04/2013   Other cataract 01/04/2013   Sickle cell anemia ([place]) 01/04/2013   Thrombocytopenia ([place]) 01/04/2013   Other dystrophy of vulva 01/04/2013   Diverticulitis of colon (without mention of hemorrhage)(562.11) 01/04/2013   Unspecified disorder of skin and subcutaneous tissue 01/04/2013   Type II or unspecified type diabetes mellitus without mention of complication, not stated as uncontrolled 01/04/2013   Hyperlipidemia LDL goal <100 01/04/2013   Seasonal allergic rhinitis due to pollen 01/04/2013   Extrinsic asthma, unspecified asthma severity, uncomplicated 73/41/9379   Other malaise and fatigue 01/04/2013   Other abnormal glucose 01/04/2013   Past Surgical History:  Procedure Laterality Date   ABDOMINAL HYSTERECTOMY     CESAREAN SECTION     1960,1964,1971,1972   CYST EXCISION  1974   Pilonidal Cyst excision   EYE SURGERY Right 04/14/2017   Dr. Zadie Rhine   KNEE ARTHROSCOPY Right    for meniscal tear   RETINAL TEAR REPAIR CRYOTHERAPY  10/21/2017   Getting  injections in right eye   Current Outpatient Medications on File Prior to Visit  Medication Sig Dispense Refill   acetaminophen (TYLENOL) 325 MG tablet Take 325 mg by mouth daily as needed. For pain     albuterol (VENTOLIN HFA) 108 (90 Base) MCG/ACT inhaler Inhale 2 puffs into the lungs every 6 (six) hours as needed for wheezing or shortness of breath. 18 g 1   amLODipine (NORVASC) 5 MG  tablet TAKE 1 TABLET(5 MG) BY MOUTH DAILY 90 tablet 1   budesonide-formoterol (SYMBICORT) 160-4.5 MCG/ACT inhaler Inhale 2 puffs into the lungs 2 (two) times daily. PLEASE GIVE PATIENT 3 MONTH SUPPLY. 10 g 1   calcium-vitamin D (OSCAL WITH D) 500-200 MG-UNIT per tablet Take 1 tablet by mouth 2 (two) times daily. 60 tablet 0   carvedilol (COREG) 12.5 MG tablet TAKE 1 TABLET ONCE DAILY FOR BLOOD PRESSURE 90 tablet 3   ELDERBERRY PO Take by mouth 3 (three) times a week.      ezetimibe (ZETIA) 10 MG tablet Take 1 tablet (10 mg total) by mouth daily. 90 tablet 1   fexofenadine (ALLEGRA) 180 MG tablet Take 180 mg by mouth as needed.      ketorolac (ACULAR) 0.5 % ophthalmic solution Place 1 drop into the right eye 4 (four) times daily.     linaclotide (LINZESS) 145 MCG CAPS capsule Take 1 capsule (145 mcg total) by mouth daily before breakfast. 30 capsule 0   losartan (COZAAR) 100 MG tablet Take 1 tablet (100 mg total) by mouth daily. 30 tablet 0   Magnesium Oxide 500 MG CAPS Take 2 capsules by mouth at bedtime.     mometasone (NASONEX) 50 MCG/ACT nasal spray One Spray each Nostril twice daily as needed for allergies. 51 g 1   Multiple Vitamins-Minerals (ZINC PO) Take 1 tablet by mouth once a week.     pantoprazole (PROTONIX) 40 MG tablet Take 1 tablet (40 mg total) by mouth daily. 30 tablet 3   phenylephrine (,USE FOR PREPARATION-H,) 0.25 % suppository Place 1 suppository rectally as needed for hemorrhoids.     prednisoLONE acetate (PRED FORTE) 1 % ophthalmic suspension Place 1 drop into the right eye 4 (four) times daily.      saccharomyces boulardii (FLORASTOR) 250 MG capsule Take 1 capsule (250 mg total) by mouth daily. 30 capsule 3   senna-docusate (SENOKOT-S) 8.6-50 MG tablet Take 1 tablet by mouth at bedtime. 30 tablet 3   simethicone (GAS-X) 80 MG chewable tablet Chew 1 tablet (80 mg total) by mouth every 6 (six) hours as needed for flatulence. 30 tablet 0   sitaGLIPtin (JANUVIA) 25 MG tablet  TAKE 1 TABLET(25 MG) BY MOUTH DAILY 90 tablet 1   sodium chloride (OCEAN) 0.65 % SOLN nasal spray Place 1 spray into both nostrils as needed for congestion. 30 mL 0   No current facility-administered medications on file prior to visit.    Allergies  Allergen Reactions   Crestor [Rosuvastatin Calcium]     Muscle cramps   Latex Other (See Comments)    GLOVES   Sulfa Antibiotics    Social History   Occupational History   Not on file  Tobacco Use   Smoking status: Former    Types: Cigarettes    Quit date: 12/13/1990    Years since quitting: 31.9   Smokeless tobacco: Never  Vaping Use   Vaping Use: Never used  Substance and Sexual Activity   Alcohol use: No    Alcohol/week: 0.0 standard  drinks of alcohol   Drug use: No   Sexual activity: Yes    Partners: Male    Comment: None    Family History  Problem Relation Age of Onset   Cancer Mother        brain   Stroke Father    Immunization History  Administered Date(s) Administered   Fluad Quad(high Dose 65+) 08/29/2019, 09/02/2020, 09/28/2021, 08/23/2022   Influenza Split 08/25/2010, 08/26/2011, 10/04/2012   Influenza Whole 09/24/2009   Influenza, High Dose Seasonal PF 08/16/2018   Influenza,inj,Quad PF,6+ Mos 09/11/2013, 09/04/2014, 11/12/2015, 10/04/2016, 09/06/2017   Influenza-Unspecified 09/02/2018   PFIZER(Purple Top)SARS-COV-2 Vaccination 02/11/2020, 03/03/2020, 09/17/2020, 07/04/2021, 10/09/2021   Pneumococcal Conjugate-13 02/17/2012   Pneumococcal Polysaccharide-23 05/19/2016   Tdap 02/17/2012   Zoster Recombinat (Shingrix) 10/15/2021, 02/02/2022    Review of Systems: Negative except as noted in the HPI.   Objective: There were no vitals filed for this visit.  KEIONDRA BROOKOVER is a pleasant 81 y.o. female in NAD. AAO X 3.  Vascular Examination: Capillary refill time immediate b/l. Vascular status intact b/l with palpable pedal pulses. Pedal hair present b/l. No edema. No pain with calf compression b/l.  Skin temperature gradient WNL b/l. Varicosities present b/l.  Neurological Examination: Sensation grossly intact b/l with 10 gram monofilament. Vibratory sensation intact b/l.   Dermatological Examination: Pedal skin with normal turgor, texture and tone b/l. Toenails 1-4 b/l thick, discolored, elongated with subungual debris and pain on dorsal palpation. Anonychia noted bilateral 5th toes. Nailbed(s) epithelialized.  Hyperkeratotic lesion(s) submet head 1 left foot.  No erythema, no edema, no drainage, no fluctuance.  Musculoskeletal Examination: Normal muscle strength 5/5 to all lower extremity muscle groups bilaterally. HAV with bunion deformity noted b/l LE. Hammertoe(s) noted to the bilateral 5th toes.. No pain, crepitus or joint limitation noted with ROM b/l LE.  Patient ambulates independently without assistive aids.  Radiographs: None  Last A1c:      Latest Ref Rng & Units 07/05/2022    9:40 AM 12/29/2021    8:54 AM  Hemoglobin A1C  Hemoglobin-A1c <5.7 % of total Hgb 6.2  6.5    Footwear Assessment: Does the patient wear appropriate shoes? Yes. Does the patient need inserts/orthotics? Yes.  ADA Risk Categorization: Low Risk :  Patient has all of the following: Intact protective sensation No prior foot ulcer  No severe deformity Pedal pulses present  Assessment: 1. Pain due to onychomycosis of toenails of both feet   2. Callus   3. Acquired hammertoes of both feet   4. Controlled type 2 diabetes mellitus without complication, without long-term current use of insulin ([place])   5. Encounter for diabetic foot exam ([place])     Plan: No orders of the defined types were placed in this encounter.  -Patient was evaluated and treated. All patient's and/or POA's questions/concerns answered on today's visit. -Diabetic foot examination performed today. -Continue diabetic foot care principles: inspect feet daily, monitor glucose as recommended by PCP and/or Endocrinologist, and follow  prescribed diet per PCP, Endocrinologist and/or dietician. -Toenails were debrided in length and girth 1-4 bilaterally with sterile nail nippers and dremel without iatrogenic bleeding. Continue Vick's Vapor Rub to affected toenail(s) once daily. -Callus(es) submet head 1 left foot pared utilizing sterile scalpel blade without complication or incident. Total number debrided =1. -Patient/POA to call should there be question/concern in the interim. Return in about 3 months (around 03/02/2023).  Marzetta Board, DPM

## 2022-12-08 ENCOUNTER — Ambulatory Visit (INDEPENDENT_AMBULATORY_CARE_PROVIDER_SITE_OTHER): Payer: Medicare Other

## 2022-12-08 DIAGNOSIS — M2041 Other hammer toe(s) (acquired), right foot: Secondary | ICD-10-CM

## 2022-12-08 DIAGNOSIS — M2042 Other hammer toe(s) (acquired), left foot: Secondary | ICD-10-CM

## 2022-12-08 DIAGNOSIS — E119 Type 2 diabetes mellitus without complications: Secondary | ICD-10-CM

## 2022-12-08 NOTE — Progress Notes (Signed)
Patient presents today to pick up diabetic shoes and insoles.  Patient was dispensed 1 pair of diabetic shoes and 3 pairs of foam casted diabetic insoles. She tried on the shoes with the insoles and the fit was satisfactory.     Will follow up with Dr Elisha Ponder next year for new order.

## 2022-12-09 ENCOUNTER — Telehealth: Payer: Self-pay | Admitting: *Deleted

## 2022-12-09 ENCOUNTER — Other Ambulatory Visit: Payer: Self-pay | Admitting: *Deleted

## 2022-12-09 MED ORDER — EZETIMIBE 10 MG PO TABS: 10.0000 mg | ORAL_TABLET | Freq: Every day | ORAL | 0 refills | Status: DC

## 2022-12-09 MED ORDER — EZETIMIBE 10 MG PO TABS: 10.0000 mg | ORAL_TABLET | Freq: Every day | ORAL | 1 refills | Status: DC

## 2022-12-09 NOTE — Telephone Encounter (Signed)
Patient called and stated that she has not received her medication from [facility] yet and only has 4 pills left.   Sent supply to local pharmacy and resent Rx to [facility].

## 2022-12-09 NOTE — Telephone Encounter (Signed)
Received Prior Authorization for patient's Ezetimibe. Initiated through [facility] and request was APPROVED  [id number] PA Case ID: [id number] CV#:[id number] 11/09/2022-12/09/2023  Patient Aware.

## 2022-12-10 ENCOUNTER — Other Ambulatory Visit: Payer: Self-pay | Admitting: Family

## 2022-12-10 ENCOUNTER — Telehealth: Payer: Self-pay | Admitting: Family

## 2022-12-10 NOTE — Telephone Encounter (Signed)
Message received from [facility] states Zetia not covered by insurance.Patient is allergic to statin please obtain a prior authorization for Zetia

## 2022-12-10 NOTE — Telephone Encounter (Signed)
Patient returned call and stated she needs short term supply from local pharmacy to last until mail order supply comes in

## 2022-12-10 NOTE — Telephone Encounter (Signed)
Left message on voicemail for patient to return call when available , reason for call is to clarify which pharmacy patient would like to received Zetia from as we approved rx yesterday for the mail order pharmacy and today the local pharmacy is sending a request.

## 2022-12-30 ENCOUNTER — Telehealth: Payer: Self-pay

## 2022-12-30 NOTE — Telephone Encounter (Signed)
Patient left voicemail on clinical intake voicemail stating that she is due for refill on Juanuvia,but she was informed by pharmacy that the cost is going from $75 to $115. They told her that there is an alternative medication saxagliptin. Please send to pharmacy if appropriate.  Message routed to Marlowe Sax, NP

## 2022-12-30 NOTE — Telephone Encounter (Signed)
-  may discontinue Januvia then started on Saxagliptin 2.5 mg tablet one by mouth daily as requested.

## 2022-12-31 MED ORDER — SAXAGLIPTIN HCL 2.5 MG PO TABS: 2.5000 mg | ORAL_TABLET | Freq: Every day | ORAL | 1 refills | Status: DC

## 2022-12-31 NOTE — Telephone Encounter (Signed)
Patient advised and agreed. Medication sent to pharmacy.

## 2023-01-04 ENCOUNTER — Other Ambulatory Visit: Payer: Self-pay | Admitting: *Deleted

## 2023-01-04 MED ORDER — LOSARTAN POTASSIUM 100 MG PO TABS: 100.0000 mg | ORAL_TABLET | Freq: Every day | ORAL | 1 refills | Status: DC

## 2023-01-04 NOTE — Telephone Encounter (Signed)
Patient requested refill to be sent to [facility].

## 2023-01-06 ENCOUNTER — Other Ambulatory Visit: Payer: Medicare Other

## 2023-01-06 DIAGNOSIS — E785 Hyperlipidemia, unspecified: Secondary | ICD-10-CM | POA: Diagnosis not present

## 2023-01-06 DIAGNOSIS — K219 Gastro-esophageal reflux disease without esophagitis: Secondary | ICD-10-CM

## 2023-01-06 DIAGNOSIS — E1142 Type 2 diabetes mellitus with diabetic polyneuropathy: Secondary | ICD-10-CM

## 2023-01-06 DIAGNOSIS — I10 Essential (primary) hypertension: Secondary | ICD-10-CM | POA: Diagnosis not present

## 2023-01-07 LAB — COMPLETE METABOLIC PANEL WITH GFR
AG Ratio: 1.1 (calc) (ref 1.0–2.5)
ALT: 12 U/L (ref 6–29)
AST: 12 U/L (ref 10–35)
Albumin: 4 g/dL (ref 3.6–5.1)
Alkaline phosphatase (APISO): 67 U/L (ref 37–153)
BUN: 11 mg/dL (ref 7–25)
CO2: 23 mmol/L (ref 20–32)
Calcium: 9.6 mg/dL (ref 8.6–10.4)
Chloride: 105 mmol/L (ref 98–110)
Creat: 0.74 mg/dL (ref 0.60–0.95)
Globulin: 3.5 g/dL (calc) (ref 1.9–3.7)
Glucose, Bld: 112 mg/dL — ABNORMAL HIGH (ref 65–99)
Potassium: 4.2 mmol/L (ref 3.5–5.3)
Sodium: 139 mmol/L (ref 135–146)
Total Bilirubin: 0.4 mg/dL (ref 0.2–1.2)
Total Protein: 7.5 g/dL (ref 6.1–8.1)
eGFR: 82 mL/min/{1.73_m2} (ref >=60)

## 2023-01-07 LAB — LIPID PANEL
Cholesterol: 156 mg/dL (ref <200)
HDL: 54 mg/dL (ref >=50)
LDL Cholesterol (Calc): 88 mg/dL (calc)
Non-HDL Cholesterol (Calc): 102 mg/dL (calc) (ref <130)
Total CHOL/HDL Ratio: 2.9 (calc) (ref <5.0)
Triglycerides: 53 mg/dL (ref <150)

## 2023-01-07 LAB — CBC WITH DIFFERENTIAL/PLATELET
Absolute Monocytes: 456 cells/uL (ref 200–950)
Basophils Absolute: 40 cells/uL (ref 0–200)
Basophils Relative: 0.7 %
Eosinophils Absolute: 131 cells/uL (ref 15–500)
Eosinophils Relative: 2.3 %
HCT: 41.2 % (ref 35.0–45.0)
Hemoglobin: 14.1 g/dL (ref 11.7–15.5)
Lymphs Abs: 2628 cells/uL (ref 850–3900)
MCH: 26.3 pg — ABNORMAL LOW (ref 27.0–33.0)
MCHC: 34.2 g/dL (ref 32.0–36.0)
MCV: 76.7 fL — ABNORMAL LOW (ref 80.0–100.0)
MPV: 12.6 fL — ABNORMAL HIGH (ref 7.5–12.5)
Monocytes Relative: 8 %
Neutro Abs: 2445 cells/uL (ref 1500–7800)
Neutrophils Relative %: 42.9 %
Platelets: 175 10*3/uL (ref 140–400)
RBC: 5.37 10*6/uL — ABNORMAL HIGH (ref 3.80–5.10)
RDW: 15.4 % — ABNORMAL HIGH (ref 11.0–15.0)
Total Lymphocyte: 46.1 %
WBC: 5.7 10*3/uL (ref 3.8–10.8)

## 2023-01-07 LAB — HEMOGLOBIN A1C
Hgb A1c MFr Bld: 6.6 % of total Hgb — ABNORMAL HIGH (ref <5.7)
Mean Plasma Glucose: 143 mg/dL
eAG (mmol/L): 7.9 mmol/L

## 2023-01-07 LAB — TSH: TSH: 0.83 mIU/L (ref 0.40–4.50)

## 2023-01-11 ENCOUNTER — Ambulatory Visit (INDEPENDENT_AMBULATORY_CARE_PROVIDER_SITE_OTHER): Payer: Medicare Other | Admitting: Family

## 2023-01-11 ENCOUNTER — Encounter: Payer: Self-pay | Admitting: Family

## 2023-01-11 VITALS — BP 126/80 | HR 73 | Temp 96.4°F | Resp 16 | Ht 61.0 in | Wt 162.4 lb

## 2023-01-11 DIAGNOSIS — K219 Gastro-esophageal reflux disease without esophagitis: Secondary | ICD-10-CM

## 2023-01-11 DIAGNOSIS — I10 Essential (primary) hypertension: Secondary | ICD-10-CM | POA: Diagnosis not present

## 2023-01-11 DIAGNOSIS — K5901 Slow transit constipation: Secondary | ICD-10-CM | POA: Diagnosis not present

## 2023-01-11 DIAGNOSIS — E785 Hyperlipidemia, unspecified: Secondary | ICD-10-CM

## 2023-01-11 DIAGNOSIS — R350 Frequency of micturition: Secondary | ICD-10-CM | POA: Diagnosis not present

## 2023-01-11 DIAGNOSIS — E1142 Type 2 diabetes mellitus with diabetic polyneuropathy: Secondary | ICD-10-CM

## 2023-01-11 LAB — POCT URINALYSIS DIPSTICK
Bilirubin, UA: NEGATIVE
Glucose, UA: NEGATIVE
Ketones, UA: POSITIVE
Leukocytes, UA: NEGATIVE
Nitrite, UA: NEGATIVE
Protein, UA: NEGATIVE
Spec Grav, UA: 1.01 (ref 1.010–1.025)
Urobilinogen, UA: NEGATIVE E.U./dL — AB
pH, UA: 6.5 (ref 5.0–8.0)

## 2023-01-11 MED ORDER — TETANUS-DIPHTH-ACELL PERTUSSIS 5-2.5-18.5 LF-MCG/0.5 IM SUSP: 0.5000 mL | Freq: Once | INTRAMUSCULAR | 0 refills | Status: AC

## 2023-01-11 MED ORDER — PANTOPRAZOLE SODIUM 40 MG PO TBEC: 40.0000 mg | DELAYED_RELEASE_TABLET | Freq: Every day | ORAL | 1 refills | Status: DC

## 2023-01-11 NOTE — Patient Instructions (Addendum)
-  Please get Tetanus vaccine at the pharmacy   - Apply Cetaphil cream or coconut oil to dry areas on the left foot

## 2023-01-11 NOTE — Progress Notes (Addendum)
Provider: Marlowe Sax FNP-C   Onya Eutsler, Nelda Bucks, NP  Patient Care Team: Satin Boal, Nelda Bucks, NP as PCP - General (Family Medicine) Teena Irani, MD (Inactive) as Consulting Physician (Gastroenterology) Warden Fillers, MD as Consulting Physician (Ophthalmology) [name], MD as Consulting Physician (Ophthalmology) Mickel Fuchs, MD as Referring Physician (Psychiatry) Velora Heckler (Inactive) (Orthotics)  Extended Emergency Contact Information Primary Emergency Contact: [place] Phone: [phone] Relation: None  Code Status:  Full Code  Goals of care: Advanced Directive information    01/11/2023    9:19 AM  Advanced Directives  Does Patient Have a Medical Advance Directive? No  Would patient like information on creating a medical advance directive? No - Patient declined     Chief Complaint  Patient presents with   Medical Management of Chronic Issues    6 month follow up.    Health Maintenance    Discuss the need for Urine Microalbumin, Eye exam, Foot exam, and AWV.   Immunizations    Discuss the need for DTAP vaccine, and Covid Booster    HPI:  Pt is a 82 y.o. female seen today for 6 months follow up for medical management of chronic diseases.  Has medical history of type 2 diabetes, hypertension, hyperlipidemia GERD, seasonal allergies, asthma, chronic constipation among other conditions. She denies any acute issues this visit. Recent lab results reviewed and discussed during visit.  Labs unremarkable except hemoglobin A1c was 6.6, glucose 112 controlled for her age.States home fasting blood sugars runs in the 140s and slightly higher after meals.Did not bring her glucometer to visit. Not on Insulin.   States does not exercise since she has been caring for her husband taking him for medical doctor visits. Up-to-date with annual eye exam follows up with specialist at [facility]. Also sees a podiatrist Dr. Adah Perl last seen 1 week ago.  Denies any symptoms of  numbness or tingling on the extremities. Due for tetanus vaccine and COVID booster vaccine made aware to get vaccines at the pharmacy.   Past Medical History:  Diagnosis Date   Acute upper respiratory infections of unspecified site    Acute upper respiratory infections of unspecified site    Acute upper respiratory infections of unspecified site    Allergic rhinitis due to pollen    Candidiasis of mouth    Disorder of bone and cartilage, unspecified    Diverticulitis of colon (without mention of hemorrhage)(562.11)    Essential hypertension, benign    Essential hypertension, benign    Extrinsic asthma, unspecified    Herpes simplex with other ophthalmic complications    Other abnormal glucose    Other and unspecified hyperlipidemia    Other and unspecified hyperlipidemia    Other and unspecified hyperlipidemia    Other cataract    Other cataract    Other dystrophy of vulva    Other malaise and fatigue    Rash and other nonspecific skin eruption    Reflux esophagitis    Thrombocytopenia, unspecified (HCC)    Type II or unspecified type diabetes mellitus without mention of complication, not stated as uncontrolled    Type II or unspecified type diabetes mellitus without mention of complication, not stated as uncontrolled    Unspecified disorder of skin and subcutaneous tissue    Unspecified essential hypertension    Unspecified essential hypertension    Past Surgical History:  Procedure Laterality Date   ABDOMINAL HYSTERECTOMY     CESAREAN SECTION     [phone]   CYST EXCISION  1974   Pilonidal Cyst excision   EYE SURGERY Right 04/14/2017   Dr. Zadie Rhine   KNEE ARTHROSCOPY Right    for meniscal tear   RETINAL TEAR REPAIR CRYOTHERAPY  10/21/2017   Getting injections in right eye    Allergies  Allergen Reactions   Crestor [Rosuvastatin Calcium]     Muscle cramps   Latex Other (See Comments)    GLOVES   Sulfa Antibiotics     Allergies as of 01/11/2023        Reactions   Crestor [rosuvastatin Calcium]    Muscle cramps   Latex Other (See Comments)   GLOVES   Sulfa Antibiotics         Medication List        Accurate as of January 11, 2023  9:45 AM. If you have any questions, ask your nurse or doctor.          acetaminophen 325 MG tablet Commonly known as: TYLENOL Take 325 mg by mouth daily as needed. For pain   albuterol 108 (90 Base) MCG/ACT inhaler Commonly known as: Ventolin HFA Inhale 2 puffs into the lungs every 6 (six) hours as needed for wheezing or shortness of breath.   amLODipine 5 MG tablet Commonly known as: NORVASC TAKE 1 TABLET(5 MG) BY MOUTH DAILY   budesonide-formoterol 160-4.5 MCG/ACT inhaler Commonly known as: SYMBICORT Inhale 2 puffs into the lungs 2 (two) times daily. PLEASE GIVE PATIENT 3 MONTH SUPPLY.   calcium-vitamin D 500-200 MG-UNIT tablet Commonly known as: OSCAL WITH D Take 1 tablet by mouth 2 (two) times daily.   carvedilol 12.5 MG tablet Commonly known as: COREG TAKE 1 TABLET ONCE DAILY FOR BLOOD PRESSURE   ELDERBERRY PO Take by mouth 3 (three) times a week.   ezetimibe 10 MG tablet Commonly known as: ZETIA TAKE 1 TABLET(10 MG) BY MOUTH DAILY   fexofenadine 180 MG tablet Commonly known as: ALLEGRA Take 180 mg by mouth as needed.   ketorolac 0.5 % ophthalmic solution Commonly known as: ACULAR Place 1 drop into the right eye 4 (four) times daily.   linaclotide 145 MCG Caps capsule Commonly known as: Linzess Take 1 capsule (145 mcg total) by mouth daily before breakfast.   losartan 100 MG tablet Commonly known as: Cozaar Take 1 tablet (100 mg total) by mouth daily.   Magnesium Oxide -Mg Supplement 500 MG Caps Take 2 capsules by mouth at bedtime.   mometasone 50 MCG/ACT nasal spray Commonly known as: NASONEX One Spray each Nostril twice daily as needed for allergies.   pantoprazole 40 MG tablet Commonly known as: PROTONIX Take 1 tablet (40 mg total) by mouth daily.    phenylephrine 0.25 % suppository Commonly known as: (USE for PREPARATION-H) Place 1 suppository rectally as needed for hemorrhoids.   prednisoLONE acetate 1 % ophthalmic suspension Commonly known as: PRED FORTE Place 1 drop into the right eye 4 (four) times daily.   saccharomyces boulardii 250 MG capsule Commonly known as: Florastor Take 1 capsule (250 mg total) by mouth daily.   saxagliptin HCl 2.5 MG Tabs tablet Commonly known as: ONGLYZA Take 1 tablet (2.5 mg total) by mouth daily.   senna-docusate 8.6-50 MG tablet Commonly known as: Senokot-S Take 1 tablet by mouth at bedtime.   simethicone 80 MG chewable tablet Commonly known as: Gas-X Chew 1 tablet (80 mg total) by mouth every 6 (six) hours as needed for flatulence.   sodium chloride 0.65 % Soln nasal spray Commonly known as: OCEAN Place 1  spray into both nostrils as needed for congestion.   ZINC PO Take 1 tablet by mouth once a week.        Review of Systems  Constitutional:  Negative for appetite change, chills, fatigue, fever and unexpected weight change.  HENT:  Negative for congestion, dental problem, ear discharge, ear pain, facial swelling, hearing loss, nosebleeds, postnasal drip, rhinorrhea, sinus pressure, sinus pain, sneezing, sore throat, tinnitus and trouble swallowing.   Eyes:  Negative for pain, discharge, redness, itching and visual disturbance.  Respiratory:  Negative for cough, chest tightness, shortness of breath and wheezing.   Cardiovascular:  Negative for chest pain, palpitations and leg swelling.  Gastrointestinal:  Negative for abdominal distention, abdominal pain, blood in stool, constipation, diarrhea, nausea and vomiting.  Endocrine: Negative for cold intolerance, heat intolerance, polydipsia, polyphagia and polyuria.  Genitourinary:  Negative for difficulty urinating, dysuria, flank pain, frequency and urgency.  Musculoskeletal:  Negative for arthralgias, back pain, gait problem, joint  swelling, myalgias, neck pain and neck stiffness.  Skin:  Negative for color change, pallor, rash and wound.  Neurological:  Negative for dizziness, syncope, speech difficulty, weakness, light-headedness, numbness and headaches.  Hematological:  Does not bruise/bleed easily.  Psychiatric/Behavioral:  Negative for agitation, behavioral problems, confusion, hallucinations, self-injury, sleep disturbance and suicidal ideas. The patient is not nervous/anxious.     Immunization History  Administered Date(s) Administered   Fluad Quad(high Dose 65+) 08/29/2019, 09/02/2020, 09/28/2021, 08/23/2022   Influenza Split 08/25/2010, 08/26/2011, 10/04/2012   Influenza Whole 09/24/2009   Influenza, High Dose Seasonal PF 08/16/2018   Influenza,inj,Quad PF,6+ Mos 09/11/2013, 09/04/2014, 11/12/2015, 10/04/2016, 09/06/2017   Influenza-Unspecified 09/02/2018   PFIZER(Purple Top)SARS-COV-2 Vaccination 02/11/2020, 03/03/2020, 09/17/2020, 07/04/2021, 10/09/2021   Pneumococcal Conjugate-13 02/17/2012   Pneumococcal Polysaccharide-23 05/19/2016   Tdap 02/17/2012   Zoster Recombinat (Shingrix) 10/15/2021, 02/02/2022   Pertinent  Health Maintenance Due  Topic Date Due   OPHTHALMOLOGY EXAM  10/13/2022   FOOT EXAM  11/03/2022   HEMOGLOBIN A1C  07/07/2023   INFLUENZA VACCINE  Completed   DEXA SCAN  Completed      01/14/2022    9:28 AM 07/09/2022   10:25 AM 09/01/2022    2:33 PM 09/08/2022    2:18 PM 01/11/2023    9:19 AM  Fall Risk  Falls in the past year? 0 0 0 0 0  Was there an injury with Fall? 0 0 0 0 0  Fall Risk Category Calculator 0 0 0 0 0  Fall Risk Category (Retired) Low Low Low Low   (RETIRED) Patient Fall Risk Level Low fall risk Low fall risk Low fall risk Low fall risk   Patient at Risk for Falls Due to No Fall Risks No Fall Risks No Fall Risks No Fall Risks No Fall Risks  Fall risk Follow up Falls evaluation completed Falls evaluation completed Falls evaluation completed Falls evaluation  completed Falls evaluation completed   Functional Status Survey:    Vitals:   01/11/23 0912  BP: 126/80  Pulse: 73  Resp: 16  Temp: (!) 96.4 F (35.8 C)  SpO2: 96%  Weight: 162 lb 6.4 oz (73.7 kg)  Height: 5\' 1"  (1.549 m)   Body mass index is 30.69 kg/m. Physical Exam Vitals reviewed.  Constitutional:      General: She is not in acute distress.    Appearance: Normal appearance. She is normal weight. She is not ill-appearing or diaphoretic.  HENT:     Head: Normocephalic.     Right Ear: Tympanic membrane,  ear canal and external ear normal. There is no impacted cerumen.     Left Ear: Tympanic membrane, ear canal and external ear normal. There is no impacted cerumen.     Nose: Nose normal. No congestion or rhinorrhea.     Mouth/Throat:     Mouth: Mucous membranes are moist.     Pharynx: Oropharynx is clear. No oropharyngeal exudate or posterior oropharyngeal erythema.  Eyes:     General: No scleral icterus.       Right eye: No discharge.        Left eye: No discharge.     Extraocular Movements: Extraocular movements intact.     Conjunctiva/sclera: Conjunctivae normal.     Pupils: Pupils are equal, round, and reactive to light.  Neck:     Vascular: No carotid bruit.  Cardiovascular:     Rate and Rhythm: Normal rate and regular rhythm.     Pulses: Normal pulses.     Heart sounds: Normal heart sounds. No murmur heard.    No friction rub. No gallop.  Pulmonary:     Effort: Pulmonary effort is normal. No respiratory distress.     Breath sounds: Normal breath sounds. No wheezing, rhonchi or rales.  Chest:     Chest wall: No tenderness.  Abdominal:     General: Bowel sounds are normal. There is no distension.     Palpations: Abdomen is soft. There is no mass.     Tenderness: There is no abdominal tenderness. There is no right CVA tenderness, left CVA tenderness, guarding or rebound.  Musculoskeletal:        General: No swelling or tenderness. Normal range of motion.      Cervical back: Normal range of motion. No rigidity or tenderness.     Right lower leg: No edema.     Left lower leg: No edema.  Lymphadenopathy:     Cervical: No cervical adenopathy.  Skin:    General: Skin is warm and dry.     Coloration: Skin is not pale.     Findings: No bruising, erythema, lesion or rash.  Neurological:     Mental Status: She is alert and oriented to person, place, and time.     Cranial Nerves: No cranial nerve deficit.     Sensory: No sensory deficit.     Motor: No weakness.     Coordination: Coordination normal.     Gait: Gait normal.  Psychiatric:        Mood and Affect: Mood normal.        Speech: Speech normal.        Behavior: Behavior normal.        Thought Content: Thought content normal.        Judgment: Judgment normal.     Labs reviewed: Recent Labs    07/05/22 0940 01/06/23 1023  NA 140 139  K 4.4 4.2  CL 106 105  CO2 24 23  GLUCOSE 132* 112*  BUN 14 11  CREATININE 0.74 0.74  CALCIUM 9.3 9.6   Recent Labs    07/05/22 0940 01/06/23 1023  AST 12 12  ALT 13 12  BILITOT 0.6 0.4  PROT 7.3 7.5   Recent Labs    07/05/22 0940 01/06/23 1023  WBC 5.9 5.7  NEUTROABS 2,419 2,445  HGB 14.1 14.1  HCT 42.2 41.2  MCV 77.0* 76.7*  PLT 180 175   Lab Results  Component Value Date   TSH 0.83 01/06/2023   Lab Results  Component Value Date   HGBA1C 6.6 (H) 01/06/2023   Lab Results  Component Value Date   CHOL 156 01/06/2023   HDL 54 01/06/2023   LDLCALC 88 01/06/2023   TRIG 53 01/06/2023   CHOLHDL 2.9 01/06/2023    Significant Diagnostic Results in last 30 days:  No results found.  Assessment/Plan  1. Slow transit constipation Continue on Senokot and Linzess -- encouraged to increase fiber in diet  - increase water intake to 6-8 glasses of water daily and exercise as tolerated   2. Type 2 diabetes mellitus with diabetic polyneuropathy, without long-term current use of insulin (HCC) Lab Results  Component Value Date    HGBA1C 6.6 (H) 01/06/2023  CBG reported in the 140's no glucometer for review. Testing CBG once daily and as needed for hypoglycemia.Not on insulin.  -Continue on Onglyza - Microalbumin / creatinine urine ratio - TSH; Future - COMPLETE METABOLIC PANEL WITH GFR; Future - CBC with Differential/Platelet; Future - Hemoglobin A1c; Future  3. Essential hypertension Blood pressure well-controlled Continue on losartan, carvedilol and amlodipine - TSH; Future - COMPLETE METABOLIC PANEL WITH GFR; Future - CBC with Differential/Platelet; Future  4. Hyperlipidemia LDL goal <100 LDL 88 at goal Continue on Zetia -Continue with dietary modification.  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 Gastroesophageal reflux disease without esophagitis Symptoms controlled. H/H stable.No tarry or black stool  - advised to avoid eating meals late in the evening and to avoid aggravating foods and spices. - continue on Protonix  - pantoprazole (PROTONIX) 40 MG tablet; Take 1 tablet (40 mg total) by mouth daily.  Dispense: 90 tablet; Refill: 1 - CBC with Differential/Platelet; Future  6. Urine frequency Afebrile  - POC Urinalysis Dipstick indicates yellow cloudy urine, positive for ketones, trace blood but negative for nitrites and leukocytes. -Advised to increase fluid intake.  Will send urine for culture - Urine Culture  Family/ staff Communication: Reviewed plan of care with patient verbalized understanding  Labs/tests ordered:  - POC Urinalysis Dipstick  - Urine Culture  Next Appointment :Return in about 6 months (around 07/12/2023) for medical mangement of chronic issues., Fasting labs in 6 months prior to visit.   Tanya Hughs, NP

## 2023-01-12 LAB — URINE CULTURE
MICRO NUMBER:: 14493564
SPECIMEN QUALITY:: ADEQUATE

## 2023-01-12 LAB — MICROALBUMIN / CREATININE URINE RATIO
Creatinine, Urine: 70 mg/dL (ref 20–275)
Microalb Creat Ratio: 23 mcg/mg creat (ref <30)
Microalb, Ur: 1.6 mg/dL

## 2023-01-13 ENCOUNTER — Telehealth: Payer: Self-pay | Admitting: *Deleted

## 2023-01-13 NOTE — Telephone Encounter (Signed)
Patient called requesting her Urine Results. Stated that she does not want them through [facility] and prefers someone to call her with the results.   Please Advise.

## 2023-01-13 NOTE — Telephone Encounter (Signed)
Ngetich, Dinah C, NP  [facility] Clinical Pool - urine microalbumin results normal no protein in the urine.  - Final urine culture shows mixed flora no urinary tract infection possible contamination.Recollect urine if still having any symptoms.     Patient notified and agreed.

## 2023-01-13 NOTE — Telephone Encounter (Signed)
See lab results .Please call and notify patient.

## 2023-01-17 ENCOUNTER — Ambulatory Visit (INDEPENDENT_AMBULATORY_CARE_PROVIDER_SITE_OTHER): Payer: Medicare Other | Admitting: Family

## 2023-01-17 ENCOUNTER — Encounter: Payer: Self-pay | Admitting: Family

## 2023-01-17 DIAGNOSIS — Z0000 Encounter for general adult medical examination without abnormal findings: Secondary | ICD-10-CM

## 2023-01-17 NOTE — Progress Notes (Signed)
This service is provided via telemedicine  No vital signs collected/recorded due to the encounter was a telemedicine visit.   Location of patient (ex: home, work):  Home  Patient consents to a telephone visit:  Yes  Location of the provider (ex: office, home):  [employer].  Name of any referring provider:  Annjeanette Sarwar, Nelda Bucks, NP   Names of all persons participating in the telemedicine service and their role in the encounter:  Patient, Tanya Bell, [place], [place], [name], NP.    Time spent on call:  8 minutes spent on the phone with Medical Assistant.        Subjective:   Tanya Bell is a 82 y.o. female who presents for Medicare Annual (Subsequent) preventive examination.  Review of Systems     Cardiac Risk Factors include: advanced age (>61mn, >>38women);diabetes mellitus;hypertension;sedentary lifestyle;obesity (BMI >30kg/m2);smoking/ tobacco exposure     Objective:    There were no vitals filed for this visit. There is no height or weight on file to calculate BMI.     01/17/2023    8:59 AM 01/11/2023    9:19 AM 09/08/2022    2:18 PM 09/01/2022    2:33 PM 07/09/2022   10:25 AM 01/14/2022    9:32 AM 01/01/2022    9:38 AM  Advanced Directives  Does Patient Have a Medical Advance Directive? No No No No No No No  Would patient like information on creating a medical advance directive? No - Patient declined No - Patient declined No - Patient declined No - Patient declined No - Patient declined No - Patient declined No - Patient declined    Current Medications (verified) Outpatient Encounter Medications as of 01/17/2023  Medication Sig   acetaminophen (TYLENOL) 325 MG tablet Take 325 mg by mouth daily as needed. For pain   albuterol (VENTOLIN HFA) 108 (90 Base) MCG/ACT inhaler Inhale 2 puffs into the lungs every 6 (six) hours as needed for wheezing or shortness of breath.   amLODipine (NORVASC) 5 MG tablet TAKE 1 TABLET(5 MG) BY MOUTH DAILY    budesonide-formoterol (SYMBICORT) 160-4.5 MCG/ACT inhaler Inhale 2 puffs into the lungs 2 (two) times daily. PLEASE GIVE PATIENT 3 MONTH SUPPLY.   calcium-vitamin D (OSCAL WITH D) 500-200 MG-UNIT per tablet Take 1 tablet by mouth 2 (two) times daily.   carvedilol (COREG) 12.5 MG tablet TAKE 1 TABLET ONCE DAILY FOR BLOOD PRESSURE   ELDERBERRY PO Take by mouth 3 (three) times a week.    ezetimibe (ZETIA) 10 MG tablet TAKE 1 TABLET(10 MG) BY MOUTH DAILY   fexofenadine (ALLEGRA) 180 MG tablet Take 180 mg by mouth as needed.    ketorolac (ACULAR) 0.5 % ophthalmic solution Place 1 drop into the right eye 4 (four) times daily.   linaclotide (LINZESS) 145 MCG CAPS capsule Take 1 capsule (145 mcg total) by mouth daily before breakfast.   losartan (COZAAR) 100 MG tablet Take 1 tablet (100 mg total) by mouth daily.   Magnesium Oxide 500 MG CAPS Take 2 capsules by mouth at bedtime.   mometasone (NASONEX) 50 MCG/ACT nasal spray One Spray each Nostril twice daily as needed for allergies.   Multiple Vitamins-Minerals (ZINC PO) Take 1 tablet by mouth once a week.   pantoprazole (PROTONIX) 40 MG tablet Take 1 tablet (40 mg total) by mouth daily.   phenylephrine (,USE FOR PREPARATION-H,) 0.25 % suppository Place 1 suppository rectally as needed for hemorrhoids.   prednisoLONE acetate (PRED FORTE) 1 % ophthalmic  suspension Place 1 drop into the right eye 4 (four) times daily.    saccharomyces boulardii (FLORASTOR) 250 MG capsule Take 1 capsule (250 mg total) by mouth daily.   saxagliptin HCl (ONGLYZA) 2.5 MG TABS tablet Take 1 tablet (2.5 mg total) by mouth daily.   senna-docusate (SENOKOT-S) 8.6-50 MG tablet Take 1 tablet by mouth at bedtime.   simethicone (GAS-X) 80 MG chewable tablet Chew 1 tablet (80 mg total) by mouth every 6 (six) hours as needed for flatulence.   sodium chloride (OCEAN) 0.65 % SOLN nasal spray Place 1 spray into both nostrils as needed for congestion.   No facility-administered encounter  medications on file as of 01/17/2023.    Allergies (verified) Crestor [rosuvastatin calcium], Latex, and Sulfa antibiotics   History: Past Medical History:  Diagnosis Date   Acute upper respiratory infections of unspecified site    Acute upper respiratory infections of unspecified site    Acute upper respiratory infections of unspecified site    Allergic rhinitis due to pollen    Candidiasis of mouth    Disorder of bone and cartilage, unspecified    Diverticulitis of colon (without mention of hemorrhage)(562.11)    Essential hypertension, benign    Essential hypertension, benign    Extrinsic asthma, unspecified    Herpes simplex with other ophthalmic complications    Other abnormal glucose    Other and unspecified hyperlipidemia    Other and unspecified hyperlipidemia    Other and unspecified hyperlipidemia    Other cataract    Other cataract    Other dystrophy of vulva    Other malaise and fatigue    Rash and other nonspecific skin eruption    Reflux esophagitis    Thrombocytopenia, unspecified (HCC)    Type II or unspecified type diabetes mellitus without mention of complication, not stated as uncontrolled    Type II or unspecified type diabetes mellitus without mention of complication, not stated as uncontrolled    Unspecified disorder of skin and subcutaneous tissue    Unspecified essential hypertension    Unspecified essential hypertension    Past Surgical History:  Procedure Laterality Date   ABDOMINAL HYSTERECTOMY     CESAREAN SECTION     [phone]   CYST EXCISION  1974   Pilonidal Cyst excision   EYE SURGERY Right 04/14/2017   Dr. Zadie Rhine   KNEE ARTHROSCOPY Right    for meniscal tear   RETINAL TEAR REPAIR CRYOTHERAPY  10/21/2017   Getting injections in right eye   Family History  Problem Relation Age of Onset   Cancer Mother        brain   Stroke Father    Social History   Socioeconomic History   Marital status: Married    Spouse name: Not  on file   Number of children: Not on file   Years of education: Not on file   Highest education level: Not on file  Occupational History   Not on file  Tobacco Use   Smoking status: Former    Types: Cigarettes    Quit date: 12/13/1990    Years since quitting: 32.1   Smokeless tobacco: Never  Vaping Use   Vaping Use: Never used  Substance and Sexual Activity   Alcohol use: No    Alcohol/week: 0.0 standard drinks of alcohol   Drug use: No   Sexual activity: Yes    Partners: Male    Comment: None   Other Topics Concern   Not on file  Social History Narrative   Not on file   Social Determinants of Health   Financial Resource Strain: Low Risk  (12/14/2017)   Overall Financial Resource Strain (CARDIA)    Difficulty of Paying Living Expenses: Not hard at all  Food Insecurity: No Food Insecurity (12/14/2017)   Hunger Vital Sign    Worried About Running Out of Food in the Last Year: Never true    Ran Out of Food in the Last Year: Never true  Transportation Needs: Unmet Transportation Needs (12/14/2017)   PRAPARE - Hydrologist (Medical): Yes    Lack of Transportation (Non-Medical): Yes  Physical Activity: Inactive (12/14/2017)   Exercise Vital Sign    Days of Exercise per Week: 0 days    Minutes of Exercise per Session: 0 min  Stress: Stress Concern Present (12/14/2017)   [place] of Stress : Very much  Social Connections: Socially Integrated (12/14/2017)   Social Connection and Isolation Panel [NHANES]    Frequency of Communication with Friends and Family: More than three times a week    Frequency of Social Gatherings with Friends and Family: Once a week    Attends Religious Services: More than 4 times per year    Active Member of [employer] or Organizations: Yes    Attends Archivist Meetings: Never    Marital Status: Married    Tobacco Counseling Counseling given: Not  Answered   Clinical Intake:  Pre-visit preparation completed: No  Pain : No/denies pain     BMI - recorded: 30.69 Nutritional Status: BMI > 30  Obese Nutritional Risks: None Diabetes: Yes (124) CBG done?: Yes (124) Did pt. bring in CBG monitor from home?: No  How often do you need to have someone help you when you read instructions, pamphlets, or other written materials from your doctor or pharmacy?: 1 - Never What is the last grade level you completed in school?: GED  Diabetic?Yes   Interpreter Needed?: No      Activities of Daily Living    01/17/2023    9:13 AM  In your present state of health, do you have any difficulty performing the following activities:  Hearing? 0  Vision? 0  Difficulty concentrating or making decisions? 0  Walking or climbing stairs? 0  Dressing or bathing? 0  Doing errands, shopping? 0  Preparing Food and eating ? N  Using the Toilet? N  In the past six months, have you accidently leaked urine? Y  Do you have problems with loss of bowel control? N  Managing your Medications? N  Managing your Finances? N  Housekeeping or managing your Housekeeping? N    Patient Care Team: Ailene Royal, Nelda Bucks, NP as PCP - General (Family Medicine) Teena Irani, MD (Inactive) as Consulting Physician (Gastroenterology) Warden Fillers, MD as Consulting Physician (Ophthalmology) [name], MD as Consulting Physician (Ophthalmology) Mickel Fuchs, MD as Referring Physician (Psychiatry) Velora Heckler (Inactive) (Orthotics)  Indicate any recent Medical Services you may have received from other than [facility] providers in the past year (date may be approximate).     Assessment:   This is a routine wellness examination for Tanya Bell.  Hearing/Vision screen Hearing Screening - Comments:: No hearing concerns.  Vision Screening - Comments:: Some vision concerns. Patient wears prescription glasses. Patient last eye exam October 2023.  Dietary issues and  exercise activities discussed: Current Exercise Habits: The patient does not participate in regular  exercise at present, Exercise limited by: None identified   Goals Addressed   None    Depression Screen    01/17/2023    8:56 AM 01/14/2022    9:28 AM 01/12/2021   10:20 AM 02/21/2020   10:13 AM 01/04/2020   10:18 AM 04/23/2019   11:36 AM 01/02/2019   10:59 AM  PHQ 2/9 Scores  PHQ - 2 Score 0 0 0 0 0 0 0    Fall Risk    01/17/2023    8:56 AM 01/11/2023    9:19 AM 09/08/2022    2:18 PM 09/01/2022    2:33 PM 07/09/2022   10:25 AM  [place] in the past year? 0 0 0 0 0  Number falls in past yr: 0 0 0 0 0  Injury with Fall? 0 0 0 0 0  Risk for fall due to : No Fall Risks No Fall Risks No Fall Risks No Fall Risks No Fall Risks  Follow up Falls evaluation completed Falls evaluation completed Falls evaluation completed Falls evaluation completed Falls evaluation completed    FALL RISK PREVENTION PERTAINING TO THE HOME:  Any stairs in or around the home? No  If so, are there any without handrails? No  Home free of loose throw rugs in walkways, pet beds, electrical cords, etc? No  Adequate lighting in your home to reduce risk of falls? Yes   ASSISTIVE DEVICES UTILIZED TO PREVENT FALLS:  Life alert? No  Use of a cane, walker or w/c? No  Grab bars in the bathroom? Yes  Shower chair or bench in shower? No  Elevated toilet seat or a handicapped toilet? No   TIMED UP AND GO:  Was the test performed? No .  Length of time to ambulate 10 feet: N/A sec.   Gait slow and steady without use of assistive device  Cognitive Function:    01/04/2020   10:20 AM 01/02/2019   11:14 AM 12/14/2017    9:25 AM 10/29/2016    9:26 AM 09/04/2014    8:44 AM  MMSE - Mini Mental State Exam  Orientation to time '4 5 5 5 5  '$ Orientation to Place '5 5 5 5 5  '$ Registration '3 3 3 3 3  '$ Attention/ Calculation '5 5 5 5 3  '$ Recall '3 3 1 2 1  '$ Language- name 2 objects '2 2 2 2 2  '$ Language- repeat '1 1 1 1 1   '$ Language- follow 3 step command '3 3 3 2 2  '$ Language- read & follow direction '1 1 1 1 1  '$ Write a sentence '1 1 1 1 1  '$ Copy design '1 1 1 1 1  '$ Total score '29 30 28 28 25        '$ 01/17/2023    8:57 AM 01/14/2022    9:29 AM 01/12/2021   10:21 AM  6CIT Screen  What Year? 0 points 0 points 0 points  What month? 0 points 0 points 0 points  What time? 0 points 0 points 0 points  Count back from 20 0 points 0 points 0 points  Months in reverse 0 points 0 points 0 points  Repeat phrase 6 points 0 points 6 points  Total Score 6 points 0 points 6 points    Immunizations Immunization History  Administered Date(s) Administered   Fluad Quad(high Dose 65+) 08/29/2019, 09/02/2020, 09/28/2021, 08/23/2022   Influenza Split 08/25/2010, 08/26/2011, 10/04/2012   Influenza Whole 09/24/2009   Influenza, High Dose Seasonal  PF 08/16/2018   Influenza,inj,Quad PF,6+ Mos 09/11/2013, 09/04/2014, 11/12/2015, 10/04/2016, 09/06/2017   Influenza-Unspecified 09/02/2018   PFIZER(Purple Top)SARS-COV-2 Vaccination 02/11/2020, 03/03/2020, 09/17/2020, 07/04/2021, 10/09/2021   Pneumococcal Conjugate-13 02/17/2012   Pneumococcal Polysaccharide-23 05/19/2016   Tdap 02/17/2012   Zoster Recombinat (Shingrix) 10/15/2021, 02/02/2022    TDAP status: Due, Education has been provided regarding the importance of this vaccine. Advised may receive this vaccine at local pharmacy or Health Dept. Aware to provide a copy of the vaccination record if obtained from local pharmacy or Health Dept. Verbalized acceptance and understanding.  Flu Vaccine status: Up to date  Pneumococcal vaccine status: Up to date  Covid-19 vaccine status: Information provided on how to obtain vaccines.   Qualifies for Shingles Vaccine? Yes   Zostavax completed Yes   Shingrix Completed?: Yes  Screening Tests Health Maintenance  Topic Date Due   DTaP/Tdap/Td (2 - Td or Tdap) 02/16/2022   COVID-19 Vaccine (6 - 2023-24 season) 08/13/2022    OPHTHALMOLOGY EXAM  10/13/2022   FOOT EXAM  11/03/2022   HEMOGLOBIN A1C  07/07/2023   Diabetic kidney evaluation - eGFR measurement  01/07/2024   Diabetic kidney evaluation - Urine ACR  01/12/2024   Medicare Annual Wellness (AWV)  01/18/2024   Pneumonia Vaccine 30+ Years old  Completed   INFLUENZA VACCINE  Completed   DEXA SCAN  Completed   Zoster Vaccines- Shingrix  Completed   HPV VACCINES  Aged Out    Health Maintenance  Health Maintenance Due  Topic Date Due   DTaP/Tdap/Td (2 - Td or Tdap) 02/16/2022   COVID-19 Vaccine (6 - 2023-24 season) 08/13/2022   OPHTHALMOLOGY EXAM  10/13/2022   FOOT EXAM  11/03/2022    Colorectal cancer screening: No longer required.   Mammogram status: No longer required due to advance age.  Bone Density status: Completed 09/18/2019. Results reflect: Bone density results: OSTEOPENIA. Repeat every 5 years.  Lung Cancer Screening: (Low Dose CT Chest recommended if Age 50-80 years, 30 pack-year currently smoking OR have quit w/in 15years.) does not qualify.   Lung Cancer Screening Referral: No   Additional Screening:  Hepatitis C Screening: does not qualify; Completed No   Vision Screening: Recommended annual ophthalmology exams for early detection of glaucoma and other disorders of the eye. Is the patient up to date with their annual eye exam?  Yes  Who is the provider or what is the name of the office in which the patient attends annual eye exams? Follows up with Dr. If pt is not established with a provider, would they like to be referred to a provider to establish care? No .   Dental Screening: Recommended annual dental exams for proper oral hygiene  Community Resource Referral / Chronic Care Management: CRR required this visit?  No   CCM required this visit?  No      Plan:     I have personally reviewed and noted the following in the patient's chart:   Medical and social history Use of alcohol, tobacco or illicit drugs  Current  medications and supplements including opioid prescriptions. Patient is not currently taking opioid prescriptions. Functional ability and status Nutritional status Physical activity Advanced directives List of other physicians Hospitalizations, surgeries, and ER visits in previous 12 months Vitals Screenings to include cognitive, depression, and falls Referrals and appointments  In addition, I have reviewed and discussed with patient certain preventive protocols, quality metrics, and best practice recommendations. A written personalized care plan for preventive services as well as general preventive  health recommendations were provided to patient.     Sandrea Hughs, NP   01/17/2023   Nurse Notes:

## 2023-01-17 NOTE — Patient Instructions (Signed)
Tanya Bell , Thank you for taking time to come for your Medicare Wellness Visit. I appreciate your ongoing commitment to your health goals. Please review the following plan we discussed and let me know if I can assist you in the future.   Screening recommendations/referrals: Colonoscopy N/A Mammogram :  Up to date  Bone Density : Up to date  Recommended yearly ophthalmology/optometry visit for glaucoma screening and checkup Recommended yearly dental visit for hygiene and checkup  Vaccinations: Influenza vaccine- due annually in September/October Pneumococcal vaccine : Up to date  Tdap vaccine Due  Shingles vaccine : Up to date  Advanced directives: No   Conditions/risks identified: advanced age (>65mn, >>31women);diabetes mellitus;hypertension;sedentary lifestyle;obesity (BMI >30kg/m2);smoking/ tobacco exposure  Next appointment: 1 year    Preventive Care 81Years and Older, Female Preventive care refers to lifestyle choices and visits with your health care provider that can promote health and wellness. What does preventive care include? A yearly physical exam. This is also called an annual well check. Dental exams once or twice a year. Routine eye exams. Ask your health care provider how often you should have your eyes checked. Personal lifestyle choices, including: Daily care of your teeth and gums. Regular physical activity. Eating a healthy diet. Avoiding tobacco and drug use. Limiting alcohol use. Practicing safe sex. Taking low-dose aspirin every day. Taking vitamin and mineral supplements as recommended by your health care provider. What happens during an annual well check? The services and screenings done by your health care provider during your annual well check will depend on your age, overall health, lifestyle risk factors, and family history of disease. Counseling  Your health care provider may ask you questions about your: Alcohol use. Tobacco use. Drug  use. Emotional well-being. Home and relationship well-being. Sexual activity. Eating habits. History of falls. Memory and ability to understand (cognition). Work and work [employer] Reproductive health. Screening  You may have the following tests or measurements: Height, weight, and BMI. Blood pressure. Lipid and cholesterol levels. These may be checked every 5 years, or more frequently if you are over 81years old. Skin check. Lung cancer screening. You may have this screening every year starting at age 8111if you have a 30-pack-year history of smoking and currently smoke or have quit within the past 15 years. Fecal occult blood test (FOBT) of the stool. You may have this test every year starting at age 81146 Flexible sigmoidoscopy or colonoscopy. You may have a sigmoidoscopy every 5 years or a colonoscopy every 10 years starting at age 81164 Hepatitis C blood test. Hepatitis B blood test. Sexually transmitted disease (STD) testing. Diabetes screening. This is done by checking your blood sugar (glucose) after you have not eaten for a while (fasting). You may have this done every 1-3 years. Bone density scan. This is done to screen for osteoporosis. You may have this done starting at age [address]1138 Mammogram. This may be done every 1-2 years. Talk to your health care provider about how often you should have regular mammograms. Talk with your health care provider about your test results, treatment options, and if necessary, the need for more tests. Vaccines  Your health care provider may recommend certain vaccines, such as: Influenza vaccine. This is recommended every year. Tetanus, diphtheria, and acellular pertussis (Tdap, Td) vaccine. You may need a Td booster every 10 years. Zoster vaccine. You may need this after age 81 Pneumococcal 13-valent conjugate (PCV13) vaccine. One dose is recommended after age [address]0530 Pneumococcal polysaccharide (PPSV23)  vaccine. One dose is recommended after age  81. Talk to your health care provider about which screenings and vaccines you need and how often you need them. This information is not intended to replace advice given to you by your health care provider. Make sure you discuss any questions you have with your health care provider. Document Released: 12/26/2015 Document Revised: 08/18/2016 Document Reviewed: 09/30/2015 Elsevier Interactive Patient Education  2017 [place] Prevention in the Home Falls can cause injuries. They can happen to people of all ages. There are many things you can do to make your home safe and to help prevent falls. What can I do on the outside of my home? Regularly fix the edges of walkways and driveways and fix any cracks. Remove anything that might make you trip as you walk through a door, such as a raised step or threshold. Trim any bushes or trees on the path to your home. Use bright outdoor lighting. Clear any walking paths of anything that might make someone trip, such as rocks or tools. Regularly check to see if handrails are loose or broken. Make sure that both sides of any steps have handrails. Any raised decks and porches should have guardrails on the edges. Have any leaves, snow, or ice cleared regularly. Use sand or salt on walking paths during winter. Clean up any spills in your garage right away. This includes oil or grease spills. What can I do in the bathroom? Use night lights. Install grab bars by the toilet and in the tub and shower. Do not use towel bars as grab bars. Use non-skid mats or decals in the tub or shower. If you need to sit down in the shower, use a plastic, non-slip stool. Keep the floor dry. Clean up any water that spills on the floor as soon as it happens. Remove soap buildup in the tub or shower regularly. Attach bath mats securely with double-sided non-slip rug tape. Do not have throw rugs and other things on the floor that can make you trip. What can I do in the  bedroom? Use night lights. Make sure that you have a light by your bed that is easy to reach. Do not use any sheets or blankets that are too big for your bed. They should not hang down onto the floor. Have a firm chair that has side arms. You can use this for support while you get dressed. Do not have throw rugs and other things on the floor that can make you trip. What can I do in the kitchen? Clean up any spills right away. Avoid walking on wet floors. Keep items that you use a lot in easy-to-reach places. If you need to reach something above you, use a strong step stool that has a grab bar. Keep electrical cords out of the way. Do not use floor polish or wax that makes floors slippery. If you must use wax, use non-skid floor wax. Do not have throw rugs and other things on the floor that can make you trip. What can I do with my stairs? Do not leave any items on the stairs. Make sure that there are handrails on both sides of the stairs and use them. Fix handrails that are broken or loose. Make sure that handrails are as long as the stairways. Check any carpeting to make sure that it is firmly attached to the stairs. Fix any carpet that is loose or worn. Avoid having throw rugs at the top or bottom  of the stairs. If you do have throw rugs, attach them to the floor with carpet tape. Make sure that you have a light switch at the top of the stairs and the bottom of the stairs. If you do not have them, ask someone to add them for you. What else can I do to help prevent falls? Wear shoes that: Do not have high heels. Have rubber bottoms. Are comfortable and fit you well. Are closed at the toe. Do not wear sandals. If you use a stepladder: Make sure that it is fully opened. Do not climb a closed stepladder. Make sure that both sides of the stepladder are locked into place. Ask someone to hold it for you, if possible. Clearly mark and make sure that you can see: Any grab bars or  handrails. First and last steps. Where the edge of each step is. Use tools that help you move around (mobility aids) if they are needed. These include: Canes. Walkers. Scooters. Crutches. Turn on the lights when you go into a dark area. Replace any light bulbs as soon as they burn out. Set up your furniture so you have a clear path. Avoid moving your furniture around. If any of your floors are uneven, fix them. If there are any pets around you, be aware of where they are. Review your medicines with your doctor. Some medicines can make you feel dizzy. This can increase your chance of falling. Ask your doctor what other things that you can do to help prevent falls. This information is not intended to replace advice given to you by your health care provider. Make sure you discuss any questions you have with your health care provider. Document Released: 09/25/2009 Document Revised: 05/06/2016 Document Reviewed: 01/03/2015 Elsevier Interactive Patient Education  2017 [employer].

## 2023-01-21 ENCOUNTER — Other Ambulatory Visit: Payer: Medicare Other

## 2023-01-21 ENCOUNTER — Other Ambulatory Visit: Payer: Self-pay

## 2023-01-21 DIAGNOSIS — R350 Frequency of micturition: Secondary | ICD-10-CM

## 2023-01-21 DIAGNOSIS — E1142 Type 2 diabetes mellitus with diabetic polyneuropathy: Secondary | ICD-10-CM | POA: Diagnosis not present

## 2023-01-22 LAB — URINE CULTURE
MICRO NUMBER:: 14545034
Result:: NO GROWTH
SPECIMEN QUALITY:: ADEQUATE

## 2023-01-22 LAB — MICROALBUMIN / CREATININE URINE RATIO
Creatinine, Urine: 200 mg/dL (ref 20–275)
Microalb Creat Ratio: 25 mcg/mg creat (ref <30)
Microalb, Ur: 4.9 mg/dL

## 2023-01-27 DIAGNOSIS — Z23 Encounter for immunization: Secondary | ICD-10-CM | POA: Diagnosis not present

## 2023-02-10 DIAGNOSIS — Z8669 Personal history of other diseases of the nervous system and sense organs: Secondary | ICD-10-CM | POA: Diagnosis not present

## 2023-02-10 DIAGNOSIS — H35372 Puckering of macula, left eye: Secondary | ICD-10-CM | POA: Diagnosis not present

## 2023-02-10 DIAGNOSIS — D573 Sickle-cell trait: Secondary | ICD-10-CM | POA: Diagnosis not present

## 2023-02-10 DIAGNOSIS — H3521 Other non-diabetic proliferative retinopathy, right eye: Secondary | ICD-10-CM | POA: Diagnosis not present

## 2023-02-10 DIAGNOSIS — H35371 Puckering of macula, right eye: Secondary | ICD-10-CM | POA: Diagnosis not present

## 2023-02-10 DIAGNOSIS — E119 Type 2 diabetes mellitus without complications: Secondary | ICD-10-CM | POA: Diagnosis not present

## 2023-02-10 DIAGNOSIS — H35351 Cystoid macular degeneration, right eye: Secondary | ICD-10-CM | POA: Diagnosis not present

## 2023-03-23 ENCOUNTER — Ambulatory Visit: Payer: Medicare Other | Admitting: Podiatry

## 2023-04-04 ENCOUNTER — Encounter: Payer: Self-pay | Admitting: Podiatry

## 2023-04-04 ENCOUNTER — Ambulatory Visit (INDEPENDENT_AMBULATORY_CARE_PROVIDER_SITE_OTHER): Payer: Medicare Other | Admitting: Podiatry

## 2023-04-04 DIAGNOSIS — B351 Tinea unguium: Secondary | ICD-10-CM

## 2023-04-04 DIAGNOSIS — M79674 Pain in right toe(s): Secondary | ICD-10-CM | POA: Diagnosis not present

## 2023-04-04 DIAGNOSIS — M79675 Pain in left toe(s): Secondary | ICD-10-CM

## 2023-04-04 DIAGNOSIS — E119 Type 2 diabetes mellitus without complications: Secondary | ICD-10-CM

## 2023-04-04 NOTE — Progress Notes (Signed)
  Subjective:  Patient ID: Tanya Bell, female    DOB: 1941/04/01,   MRN: [id number]  Chief Complaint  Patient presents with   Debridement    Trim toenails/calluses-diabetic - last A1c was 6.6    82 y.o. female presents for concern of thickened elongated and painful nails that are difficult to trim. Requesting to have them trimmed today. Relates burning and tingling in their feet. Patient is diabetic and last A1c was  Lab Results  Component Value Date   HGBA1C 6.6 (H) 01/06/2023   .   PCP:  Ngetich, Donalee Citrin, NP    . Denies any other pedal complaints. Denies n/v/f/c.   Past Medical History:  Diagnosis Date   Acute upper respiratory infections of unspecified site    Acute upper respiratory infections of unspecified site    Acute upper respiratory infections of unspecified site    Allergic rhinitis due to pollen    Candidiasis of mouth    Disorder of bone and cartilage, unspecified    Diverticulitis of colon (without mention of hemorrhage)(562.11)    Essential hypertension, benign    Essential hypertension, benign    Extrinsic asthma, unspecified    Herpes simplex with other ophthalmic complications    Other abnormal glucose    Other and unspecified hyperlipidemia    Other and unspecified hyperlipidemia    Other and unspecified hyperlipidemia    Other cataract    Other cataract    Other dystrophy of vulva    Other malaise and fatigue    Rash and other nonspecific skin eruption    Reflux esophagitis    Thrombocytopenia, unspecified    Type II or unspecified type diabetes mellitus without mention of complication, not stated as uncontrolled    Type II or unspecified type diabetes mellitus without mention of complication, not stated as uncontrolled    Unspecified disorder of skin and subcutaneous tissue    Unspecified essential hypertension    Unspecified essential hypertension     Objective:  Physical Exam: Vascular: DP/PT pulses 2/4 bilateral. CFT <3 seconds.  Absent hair growth on digits. Edema noted to bilateral lower extremities. Xerosis noted bilaterally.  Skin. No lacerations or abrasions bilateral feet. Nails 1-5 bilateral  are thickened discolored and elongated with subungual debris.  Musculoskeletal: MMT 5/5 bilateral lower extremities in DF, PF, Inversion and Eversion. Deceased ROM in DF of ankle joint. Bilateral HAV deformitty and hammered digits 2-5 bilateral.  Neurological: Sensation intact to light touch. Protective sensation diminished bilateral. ]   Assessment:   1. Pain due to onychomycosis of toenails of both feet   2. Controlled type 2 diabetes mellitus without complication, without long-term current use of insulin      Plan:  Patient was evaluated and treated and all questions answered. -Discussed and educated patient on diabetic foot care, especially with  regards to the vascular, neurological and musculoskeletal systems.  -Stressed the importance of good glycemic control and the detriment of not  controlling glucose levels in relation to the foot. -Discussed supportive shoes at all times and checking feet regularly.  -Mechanically debrided all nails 1-5 bilateral using sterile nail nipper and filed with dremel without incident  -Answered all patient questions -Patient to return  in 3 months for at risk foot care -Patient advised to call the office if any problems or questions arise in the meantime.   Louann Sjogren, DPM

## 2023-04-06 ENCOUNTER — Other Ambulatory Visit: Payer: Self-pay

## 2023-04-06 DIAGNOSIS — I10 Essential (primary) hypertension: Secondary | ICD-10-CM

## 2023-04-06 MED ORDER — LOSARTAN POTASSIUM 100 MG PO TABS: 100.0000 mg | ORAL_TABLET | Freq: Every day | ORAL | 2 refills | Status: DC

## 2023-04-06 MED ORDER — AMLODIPINE BESYLATE 5 MG PO TABS: ORAL_TABLET | ORAL | 2 refills | Status: DC

## 2023-04-06 NOTE — Telephone Encounter (Signed)
Patient request refill on medications.

## 2023-06-07 ENCOUNTER — Other Ambulatory Visit: Payer: Self-pay

## 2023-06-07 DIAGNOSIS — E1142 Type 2 diabetes mellitus with diabetic polyneuropathy: Secondary | ICD-10-CM

## 2023-06-07 DIAGNOSIS — I10 Essential (primary) hypertension: Secondary | ICD-10-CM

## 2023-06-07 DIAGNOSIS — E785 Hyperlipidemia, unspecified: Secondary | ICD-10-CM

## 2023-06-07 DIAGNOSIS — K219 Gastro-esophageal reflux disease without esophagitis: Secondary | ICD-10-CM

## 2023-06-08 ENCOUNTER — Encounter: Payer: Self-pay | Admitting: Nurse Practitioner

## 2023-06-08 ENCOUNTER — Ambulatory Visit (INDEPENDENT_AMBULATORY_CARE_PROVIDER_SITE_OTHER): Payer: Medicare Other | Admitting: Nurse Practitioner

## 2023-06-08 VITALS — BP 130/80 | HR 77 | Temp 97.7°F | Resp 18 | Ht 61.0 in | Wt 161.0 lb

## 2023-06-08 DIAGNOSIS — B029 Zoster without complications: Secondary | ICD-10-CM

## 2023-06-08 MED ORDER — GABAPENTIN 100 MG PO CAPS
100.0000 mg | ORAL_CAPSULE | Freq: Two times a day (BID) | ORAL | 0 refills | Status: DC | PRN
Start: 2023-06-08 — End: 2024-03-23

## 2023-06-08 MED ORDER — VALACYCLOVIR HCL 1 G PO TABS
1000.0000 mg | ORAL_TABLET | Freq: Three times a day (TID) | ORAL | 0 refills | Status: DC
Start: 2023-06-08 — End: 2024-01-16

## 2023-06-08 NOTE — Progress Notes (Signed)
Careteam: Patient Care Team: Ngetich, Donalee Citrin, NP as PCP - General (Family Medicine) Dorena Cookey, MD (Inactive) as Consulting Physician (Gastroenterology) Sallye Lat, MD as Consulting Physician (Ophthalmology) Luciana Axe Alford Highland, MD as Consulting Physician (Ophthalmology) Aggie Cosier, MD as Referring Physician (Psychiatry) Ria Clock (Inactive) (Orthotics)  PLACE OF SERVICE:  [facility]  Advanced Directive information    Allergies  Allergen Reactions   Crestor [Rosuvastatin Calcium]     Muscle cramps   Latex Other (See Comments)    GLOVES   Sulfa Antibiotics     Chief Complaint  Patient presents with   Acute Visit    Patient is being seen for right side pain that comes and goes burning/ tingling feeling in backside right upper quadrant     HPI: Patient is a 82 y.o. female due to pain on right side.  Started 3 days ago.  Burning, tingling sensation. Radiating pain. Not all the time. Noticing more when she moves and stretches. Off and on.  Did not wear a bra today because it is uncomfortable.  Only on the right side.  No fever or chills.  No back pain No rash or sores.   Feels like she is under a lot of stress all the time. House just got painted.  Review of Systems:  Review of Systems  Constitutional:  Negative for chills, fever and malaise/fatigue.  Musculoskeletal:  Negative for back pain, myalgias and neck pain.  Neurological:  Positive for tingling. Negative for dizziness, weakness and headaches.       Shooting, burning pain to mid back    Past Medical History:  Diagnosis Date   Acute upper respiratory infections of unspecified site    Acute upper respiratory infections of unspecified site    Acute upper respiratory infections of unspecified site    Allergic rhinitis due to pollen    Candidiasis of mouth    Disorder of bone and cartilage, unspecified    Diverticulitis of colon (without mention of hemorrhage)(562.11)    Essential hypertension,  benign    Essential hypertension, benign    Extrinsic asthma, unspecified    Herpes simplex with other ophthalmic complications    Other abnormal glucose    Other and unspecified hyperlipidemia    Other and unspecified hyperlipidemia    Other and unspecified hyperlipidemia    Other cataract    Other cataract    Other dystrophy of vulva    Other malaise and fatigue    Rash and other nonspecific skin eruption    Reflux esophagitis    Thrombocytopenia, unspecified (HCC)    Type II or unspecified type diabetes mellitus without mention of complication, not stated as uncontrolled    Type II or unspecified type diabetes mellitus without mention of complication, not stated as uncontrolled    Unspecified disorder of skin and subcutaneous tissue    Unspecified essential hypertension    Unspecified essential hypertension    Past Surgical History:  Procedure Laterality Date   ABDOMINAL HYSTERECTOMY     CESAREAN SECTION     [phone]   CYST EXCISION  1974   Pilonidal Cyst excision   EYE SURGERY Right 04/14/2017   Dr. Luciana Axe   KNEE ARTHROSCOPY Right    for meniscal tear   RETINAL TEAR REPAIR CRYOTHERAPY  10/21/2017   Getting injections in right eye   Social History:   reports that she quit smoking about 32 years ago. Her smoking use included cigarettes. She has never used smokeless tobacco. She reports  that she does not drink alcohol and does not use drugs.  Family History  Problem Relation Age of Onset   Cancer Mother        brain   Stroke Father     Medications: Patient's Medications  New Prescriptions   No medications on file  Previous Medications   ACETAMINOPHEN (TYLENOL) 325 MG TABLET    Take 325 mg by mouth daily as needed. For pain   ALBUTEROL (VENTOLIN HFA) 108 (90 BASE) MCG/ACT INHALER    Inhale 2 puffs into the lungs every 6 (six) hours as needed for wheezing or shortness of breath.   AMLODIPINE (NORVASC) 5 MG TABLET    TAKE 1 TABLET(5 MG) BY MOUTH DAILY    BUDESONIDE-FORMOTEROL (SYMBICORT) 160-4.5 MCG/ACT INHALER    Inhale 2 puffs into the lungs 2 (two) times daily. PLEASE GIVE PATIENT 3 MONTH SUPPLY.   CALCIUM-VITAMIN D (OSCAL WITH D) 500-200 MG-UNIT PER TABLET    Take 1 tablet by mouth 2 (two) times daily.   CARVEDILOL (COREG) 12.5 MG TABLET    TAKE 1 TABLET ONCE DAILY FOR BLOOD PRESSURE   ELDERBERRY PO    Take by mouth 3 (three) times a week.    EZETIMIBE (ZETIA) 10 MG TABLET    TAKE 1 TABLET(10 MG) BY MOUTH DAILY   FEXOFENADINE (ALLEGRA) 180 MG TABLET    Take 180 mg by mouth as needed.    KETOROLAC (ACULAR) 0.5 % OPHTHALMIC SOLUTION    Place 1 drop into the right eye 4 (four) times daily.   LINACLOTIDE (LINZESS) 145 MCG CAPS CAPSULE    Take 1 capsule (145 mcg total) by mouth daily before breakfast.   LOSARTAN (COZAAR) 100 MG TABLET    Take 1 tablet (100 mg total) by mouth daily.   MAGNESIUM OXIDE 500 MG CAPS    Take 2 capsules by mouth at bedtime.   MOMETASONE (NASONEX) 50 MCG/ACT NASAL SPRAY    One Spray each Nostril twice daily as needed for allergies.   MULTIPLE VITAMINS-MINERALS (ZINC PO)    Take 1 tablet by mouth once a week.   PANTOPRAZOLE (PROTONIX) 40 MG TABLET    Take 1 tablet (40 mg total) by mouth daily.   PHENYLEPHRINE (,USE FOR PREPARATION-H,) 0.25 % SUPPOSITORY    Place 1 suppository rectally as needed for hemorrhoids.   PREDNISOLONE ACETATE (PRED FORTE) 1 % OPHTHALMIC SUSPENSION    Place 1 drop into the right eye 4 (four) times daily.    SACCHAROMYCES BOULARDII (FLORASTOR) 250 MG CAPSULE    Take 1 capsule (250 mg total) by mouth daily.   SAXAGLIPTIN HCL (ONGLYZA) 2.5 MG TABS TABLET    Take 1 tablet (2.5 mg total) by mouth daily.   SENNA-DOCUSATE (SENOKOT-S) 8.6-50 MG TABLET    Take 1 tablet by mouth at bedtime.   SIMETHICONE (GAS-X) 80 MG CHEWABLE TABLET    Chew 1 tablet (80 mg total) by mouth every 6 (six) hours as needed for flatulence.   SODIUM CHLORIDE (OCEAN) 0.65 % SOLN NASAL SPRAY    Place 1 spray into both nostrils as  needed for congestion.  Modified Medications   No medications on file  Discontinued Medications   No medications on file    Physical Exam:  Vitals:   06/08/23 1355  BP: 130/80  Pulse: 77  Resp: 18  Temp: 97.7 F (36.5 C)  SpO2: 95%  Weight: 161 lb (73 kg)  Height: 5\' 1"  (1.549 m)   Body mass index is 30.42 kg/m.  Wt Readings from Last 3 Encounters:  06/08/23 161 lb (73 kg)  01/11/23 162 lb 6.4 oz (73.7 kg)  09/08/22 164 lb 3.2 oz (74.5 kg)    Physical Exam Constitutional:      Appearance: Normal appearance.  Pulmonary:     Effort: Pulmonary effort is normal.  Musculoskeletal:       Back:     Comments: Pain noted to mid back radiating, tingling, stinging pain.  No blisters or vesicles noted  Neurological:     Mental Status: She is alert. Mental status is at baseline.  Psychiatric:        Mood and Affect: Mood normal.     Labs reviewed: Basic Metabolic Panel: Recent Labs    07/05/22 0940 01/06/23 1023  NA 140 139  K 4.4 4.2  CL 106 105  CO2 24 23  GLUCOSE 132* 112*  BUN 14 11  CREATININE 0.74 0.74  CALCIUM 9.3 9.6  TSH  --  0.83   Liver Function Tests: Recent Labs    07/05/22 0940 01/06/23 1023  AST 12 12  ALT 13 12  BILITOT 0.6 0.4  PROT 7.3 7.5   No results for input(s): "LIPASE", "AMYLASE" in the last 8760 hours. No results for input(s): "AMMONIA" in the last 8760 hours. CBC: Recent Labs    07/05/22 0940 01/06/23 1023  WBC 5.9 5.7  NEUTROABS 2,419 2,445  HGB 14.1 14.1  HCT 42.2 41.2  MCV 77.0* 76.7*  PLT 180 175   Lipid Panel: Recent Labs    07/05/22 0940 01/06/23 1023  CHOL 156 156  HDL 52 54  LDLCALC 90 88  TRIG 58 53  CHOLHDL 3.0 2.9   TSH: Recent Labs    01/06/23 1023  TSH 0.83   A1C: Lab Results  Component Value Date   HGBA1C 6.6 (H) 01/06/2023     Assessment/Plan 1. Herpes zoster without complication No blisters or vesicles noted but history consistent with shingles, will treat due to burning pain and  discomfort in attempts to shorten course.  - valACYclovir (VALTREX) 1000 MG tablet; Take 1 tablet (1,000 mg total) by mouth 3 (three) times daily.  Dispense: 21 tablet; Refill: 0 - gabapentin (NEURONTIN) 100 MG capsule; Take 1 capsule (100 mg total) by mouth 2 (two) times daily as needed. For pain  Dispense: 60 capsule; Refill: 0 To call if symptoms worsen or new symptoms appear.   Janene Harvey. [facility] [phone]

## 2023-06-08 NOTE — Patient Instructions (Signed)
To start valtrex 1000 mg by mouth three times daily- take with food  For pain can use gabapentin 100 mg by mouth twice daily as needed for pain

## 2023-06-13 ENCOUNTER — Ambulatory Visit
Admission: RE | Admit: 2023-06-13 | Discharge: 2023-06-13 | Disposition: A | Discharge status: Home / Self Care | Payer: Medicare Other | Source: Ambulatory Visit | Attending: Family | Admitting: Family

## 2023-06-13 ENCOUNTER — Telehealth: Payer: Self-pay

## 2023-06-13 DIAGNOSIS — M1811 Unilateral primary osteoarthritis of first carpometacarpal joint, right hand: Secondary | ICD-10-CM | POA: Diagnosis not present

## 2023-06-13 DIAGNOSIS — M79641 Pain in right hand: Secondary | ICD-10-CM

## 2023-06-13 DIAGNOSIS — M19041 Primary osteoarthritis, right hand: Secondary | ICD-10-CM | POA: Diagnosis not present

## 2023-06-13 MED ORDER — SAXAGLIPTIN HCL 2.5 MG PO TABS: 2.5000 mg | ORAL_TABLET | Freq: Every day | ORAL | 3 refills | Status: DC

## 2023-06-13 NOTE — Telephone Encounter (Signed)
Patient called for a refill request on Saxagliptin 2.5 MG and medication was send into pharmacy.

## 2023-07-04 ENCOUNTER — Ambulatory Visit (INDEPENDENT_AMBULATORY_CARE_PROVIDER_SITE_OTHER): Payer: Medicare Other | Admitting: Podiatry

## 2023-07-04 ENCOUNTER — Encounter: Payer: Self-pay | Admitting: Podiatry

## 2023-07-04 DIAGNOSIS — M79674 Pain in right toe(s): Secondary | ICD-10-CM | POA: Diagnosis not present

## 2023-07-04 DIAGNOSIS — E119 Type 2 diabetes mellitus without complications: Secondary | ICD-10-CM

## 2023-07-04 DIAGNOSIS — L84 Corns and callosities: Secondary | ICD-10-CM | POA: Diagnosis not present

## 2023-07-04 DIAGNOSIS — B351 Tinea unguium: Secondary | ICD-10-CM

## 2023-07-04 DIAGNOSIS — M79675 Pain in left toe(s): Secondary | ICD-10-CM | POA: Diagnosis not present

## 2023-07-04 NOTE — Progress Notes (Signed)
Subjective:  Patient ID: Tanya Bell, female    DOB: [birth year],   MRN: [id number]  Chief Complaint  Patient presents with   Diabetes    Patient came in today for Diabetic foot care, nail and callus trim, A1c-6.6, BG-97    82 y.o. female presents for concern of thickened elongated and painful nails that are difficult to trim. Requesting to have them trimmed today. Relates burning and tingling in their feet. Patient is diabetic and last A1c was  Lab Results  Component Value Date   HGBA1C 6.6 (H) 01/06/2023   .   PCP:  Ngetich, Donalee Citrin, NP    . Denies any other pedal complaints. Denies n/v/f/c.   Past Medical History:  Diagnosis Date   Acute upper respiratory infections of unspecified site    Acute upper respiratory infections of unspecified site    Acute upper respiratory infections of unspecified site    Allergic rhinitis due to pollen    Candidiasis of mouth    Disorder of bone and cartilage, unspecified    Diverticulitis of colon (without mention of hemorrhage)(562.11)    Essential hypertension, benign    Essential hypertension, benign    Extrinsic asthma, unspecified    Herpes simplex with other ophthalmic complications    Other abnormal glucose    Other and unspecified hyperlipidemia    Other and unspecified hyperlipidemia    Other and unspecified hyperlipidemia    Other cataract    Other cataract    Other dystrophy of vulva    Other malaise and fatigue    Rash and other nonspecific skin eruption    Reflux esophagitis    Thrombocytopenia, unspecified (HCC)    Type II or unspecified type diabetes mellitus without mention of complication, not stated as uncontrolled    Type II or unspecified type diabetes mellitus without mention of complication, not stated as uncontrolled    Unspecified disorder of skin and subcutaneous tissue    Unspecified essential hypertension    Unspecified essential hypertension     Objective:  Physical Exam: Vascular: DP/PT pulses  2/4 bilateral. CFT <3 seconds. Absent hair growth on digits. Edema noted to bilateral lower extremities. Xerosis noted bilaterally.  Skin. No lacerations or abrasions bilateral feet. Nails 1-5 bilateral  are thickened discolored and elongated with subungual debris. Hyperkeratotic lesion noted sub fifth metatarsal bilateral and sub first metatarsal on right  Musculoskeletal: MMT 5/5 bilateral lower extremities in DF, PF, Inversion and Eversion. Deceased ROM in DF of ankle joint. Bilateral HAV deformitty and hammered digits 2-5 bilateral.  Neurological: Sensation intact to light touch. Protective sensation diminished bilateral. ]   Assessment:   1. Pain due to onychomycosis of toenails of both feet   2. Controlled type 2 diabetes mellitus without complication, without long-term current use of insulin (HCC)      Plan:  Patient was evaluated and treated and all questions answered. -Discussed and educated patient on diabetic foot care, especially with  regards to the vascular, neurological and musculoskeletal systems.  -Stressed the importance of good glycemic control and the detriment of not  controlling glucose levels in relation to the foot. -Discussed supportive shoes at all times and checking feet regularly.  -Mechanically debrided all nails 1-5 bilateral using sterile nail nipper and filed with dremel without incident  -Hyperkeratotic area debrided without incident with chisel.  -Answered all patient questions -Patient to return  in 3 months for at risk foot care -Patient advised to call the office if any problems or  questions arise in the meantime.   Louann Sjogren, DPM

## 2023-07-07 ENCOUNTER — Other Ambulatory Visit: Payer: Medicare Other

## 2023-07-07 DIAGNOSIS — K219 Gastro-esophageal reflux disease without esophagitis: Secondary | ICD-10-CM | POA: Diagnosis not present

## 2023-07-07 DIAGNOSIS — E785 Hyperlipidemia, unspecified: Secondary | ICD-10-CM | POA: Diagnosis not present

## 2023-07-07 DIAGNOSIS — I10 Essential (primary) hypertension: Secondary | ICD-10-CM | POA: Diagnosis not present

## 2023-07-07 DIAGNOSIS — E1142 Type 2 diabetes mellitus with diabetic polyneuropathy: Secondary | ICD-10-CM | POA: Diagnosis not present

## 2023-07-07 LAB — CBC WITH DIFFERENTIAL/PLATELET
Absolute Monocytes: 550 cells/uL (ref 200–950)
Basophils Absolute: 39 cells/uL (ref 0–200)
Basophils Relative: 0.7 %
Eosinophils Relative: 3.1 %
HCT: 41.8 % (ref 35.0–45.0)
Hemoglobin: 13.7 g/dL (ref 11.7–15.5)
Lymphs Abs: 2646 cells/uL (ref 850–3900)
MCH: 25.7 pg — ABNORMAL LOW (ref 27.0–33.0)
MCV: 78.4 fL — ABNORMAL LOW (ref 80.0–100.0)
MPV: 12.7 fL — ABNORMAL HIGH (ref 7.5–12.5)
Monocytes Relative: 10 %
Platelets: 162 10*3/uL (ref 140–400)
RBC: 5.33 10*6/uL — ABNORMAL HIGH (ref 3.80–5.10)
RDW: 15.7 % — ABNORMAL HIGH (ref 11.0–15.0)
Total Lymphocyte: 48.1 %
WBC: 5.5 10*3/uL (ref 3.8–10.8)

## 2023-07-14 ENCOUNTER — Encounter: Payer: Self-pay | Admitting: Family

## 2023-07-14 ENCOUNTER — Ambulatory Visit (INDEPENDENT_AMBULATORY_CARE_PROVIDER_SITE_OTHER): Payer: Medicare Other | Admitting: Family

## 2023-07-14 VITALS — BP 140/80 | HR 62 | Temp 96.9°F | Resp 18 | Ht 61.0 in | Wt 163.0 lb

## 2023-07-14 DIAGNOSIS — M19041 Primary osteoarthritis, right hand: Secondary | ICD-10-CM | POA: Diagnosis not present

## 2023-07-14 DIAGNOSIS — K219 Gastro-esophageal reflux disease without esophagitis: Secondary | ICD-10-CM

## 2023-07-14 DIAGNOSIS — E1142 Type 2 diabetes mellitus with diabetic polyneuropathy: Secondary | ICD-10-CM

## 2023-07-14 DIAGNOSIS — I10 Essential (primary) hypertension: Secondary | ICD-10-CM

## 2023-07-14 DIAGNOSIS — E785 Hyperlipidemia, unspecified: Secondary | ICD-10-CM | POA: Diagnosis not present

## 2023-07-14 DIAGNOSIS — B029 Zoster without complications: Secondary | ICD-10-CM

## 2023-07-14 MED ORDER — VITAMIN B-12 1000 MCG PO TABS: 1000.0000 ug | ORAL_TABLET | Freq: Every day | ORAL | 1 refills | Status: AC

## 2023-07-14 MED ORDER — EZETIMIBE 10 MG PO TABS
10.0000 mg | ORAL_TABLET | Freq: Every day | ORAL | 1 refills | Status: DC
Start: 2023-07-14 — End: 2024-01-17

## 2023-07-14 MED ORDER — MAGNESIUM GLUCONATE 250 MG PO TABS: 250.0000 mg | ORAL_TABLET | Freq: Every day | ORAL | 3 refills | Status: DC

## 2023-07-14 NOTE — Patient Instructions (Signed)
-   Please get COVID-19 vaccine and Tetanus vaccine at the pharmacy

## 2023-07-14 NOTE — Progress Notes (Signed)
Provider: Richarda Blade FNP-C   Glendi Mohiuddin, Donalee Citrin, NP  Patient Care Team: Maven Varelas, Donalee Citrin, NP as PCP - General (Family Medicine) Dorena Cookey, MD (Inactive) as Consulting Physician (Gastroenterology) Sallye Lat, MD as Consulting Physician (Ophthalmology) Luciana Axe Alford Highland, MD as Consulting Physician (Ophthalmology) Aggie Cosier, MD as Referring Physician (Psychiatry) Ria Clock (Inactive) (Orthotics)  Extended Emergency Contact Information Primary Emergency Contact: JAMES,THOMAS Home Phone: [phone] Relation: None  Code Status:  Full Code  Goals of care: Advanced Directive information    01/17/2023    8:59 AM  Advanced Directives  Does Patient Have a Medical Advance Directive? No  Would patient like information on creating a medical advance directive? No - Patient declined     Chief Complaint  Patient presents with   Medical Management of Chronic Issues    Patient is here for a follow up for chronic conditions    Quality Metric Gaps    Patient is due for Tdap and covid vaccine, flu vaccine, eye exam, and foot exam,    HPI:  Pt is a 82 y.o. female seen today for 6 months follow up for medical management of chronic diseases. Recent lab work reviewed and discussed during visit.   Hyperlipidemia - total cholesterol ,TRG and LDL remains at goal.  Type 2 Dm - Glucose 128,A 1 C 6.5 previous 6.6.states CBG runs in the 80 - 90's  Has upcoming appointment on 08/11/2023 with Ophthalmologist. Follows up with podiatrist for annual foot exam.   Hypertension - no home B/p for review.she denies any headache,dizziness,vision changes,fatigue,chest tightness,palpitation,chest pain or shortness of breath.      Past Medical History:  Diagnosis Date   Acute upper respiratory infections of unspecified site    Acute upper respiratory infections of unspecified site    Acute upper respiratory infections of unspecified site    Allergic rhinitis due to pollen    Candidiasis of  mouth    Disorder of bone and cartilage, unspecified    Diverticulitis of colon (without mention of hemorrhage)(562.11)    Essential hypertension, benign    Essential hypertension, benign    Extrinsic asthma, unspecified    Herpes simplex with other ophthalmic complications    Other abnormal glucose    Other and unspecified hyperlipidemia    Other and unspecified hyperlipidemia    Other and unspecified hyperlipidemia    Other cataract    Other cataract    Other dystrophy of vulva    Other malaise and fatigue    Rash and other nonspecific skin eruption    Reflux esophagitis    Thrombocytopenia, unspecified (HCC)    Type II or unspecified type diabetes mellitus without mention of complication, not stated as uncontrolled    Type II or unspecified type diabetes mellitus without mention of complication, not stated as uncontrolled    Unspecified disorder of skin and subcutaneous tissue    Unspecified essential hypertension    Unspecified essential hypertension    Past Surgical History:  Procedure Laterality Date   ABDOMINAL HYSTERECTOMY     CESAREAN SECTION     [phone]   CYST EXCISION  1974   Pilonidal Cyst excision   EYE SURGERY Right 04/14/2017   Dr. Luciana Axe   KNEE ARTHROSCOPY Right    for meniscal tear   RETINAL TEAR REPAIR CRYOTHERAPY  10/21/2017   Getting injections in right eye    Allergies  Allergen Reactions   Crestor [Rosuvastatin Calcium]     Muscle cramps   Latex Other (See Comments)  GLOVES   Sulfa Antibiotics     Allergies as of 07/14/2023       Reactions   Crestor [rosuvastatin Calcium]    Muscle cramps   Latex Other (See Comments)   GLOVES   Sulfa Antibiotics         Medication List        Accurate as of July 14, 2023 11:59 PM. If you have any questions, ask your nurse or doctor.          STOP taking these medications    Magnesium Oxide -Mg Supplement 500 MG Caps Stopped by: Keyasha Miah C Armarion Greek       TAKE these medications     acetaminophen 325 MG tablet Commonly known as: TYLENOL Take 325 mg by mouth daily as needed. For pain   albuterol 108 (90 Base) MCG/ACT inhaler Commonly known as: Ventolin HFA Inhale 2 puffs into the lungs every 6 (six) hours as needed for wheezing or shortness of breath.   amLODipine 5 MG tablet Commonly known as: NORVASC TAKE 1 TABLET(5 MG) BY MOUTH DAILY   budesonide-formoterol 160-4.5 MCG/ACT inhaler Commonly known as: SYMBICORT Inhale 2 puffs into the lungs 2 (two) times daily. PLEASE GIVE PATIENT 3 MONTH SUPPLY.   calcium-vitamin D 500-200 MG-UNIT tablet Commonly known as: OSCAL WITH D Take 1 tablet by mouth 2 (two) times daily.   carvedilol 12.5 MG tablet Commonly known as: COREG TAKE 1 TABLET ONCE DAILY FOR BLOOD PRESSURE   cyanocobalamin 1000 MCG tablet Commonly known as: VITAMIN B12 Take 1 tablet (1,000 mcg total) by mouth daily. Started by: Donalee Citrin Valincia Touch   ELDERBERRY PO Take by mouth 3 (three) times a week.   ezetimibe 10 MG tablet Commonly known as: ZETIA Take 1 tablet (10 mg total) by mouth daily. What changed: See the new instructions. Changed by: Donalee Citrin Edric Fetterman   fexofenadine 180 MG tablet Commonly known as: ALLEGRA Take 180 mg by mouth as needed.   gabapentin 100 MG capsule Commonly known as: NEURONTIN Take 1 capsule (100 mg total) by mouth 2 (two) times daily as needed. For pain   ketorolac 0.5 % ophthalmic solution Commonly known as: ACULAR Place 1 drop into the right eye 4 (four) times daily.   linaclotide 145 MCG Caps capsule Commonly known as: Linzess Take 1 capsule (145 mcg total) by mouth daily before breakfast.   losartan 100 MG tablet Commonly known as: Cozaar Take 1 tablet (100 mg total) by mouth daily.   Magnesium Gluconate 250 MG Tabs Take 1 tablet (250 mg total) by mouth daily. Started by: Donalee Citrin Malikai Gut   mometasone 50 MCG/ACT nasal spray Commonly known as: NASONEX One Spray each Nostril twice daily as needed for  allergies.   pantoprazole 40 MG tablet Commonly known as: PROTONIX Take 1 tablet (40 mg total) by mouth daily.   phenylephrine 0.25 % suppository Commonly known as: (USE for PREPARATION-H) Place 1 suppository rectally as needed for hemorrhoids.   prednisoLONE acetate 1 % ophthalmic suspension Commonly known as: PRED FORTE Place 1 drop into the right eye 4 (four) times daily.   saccharomyces boulardii 250 MG capsule Commonly known as: Florastor Take 1 capsule (250 mg total) by mouth daily.   saxagliptin HCl 2.5 MG Tabs tablet Commonly known as: ONGLYZA Take 1 tablet (2.5 mg total) by mouth daily.   senna-docusate 8.6-50 MG tablet Commonly known as: Senokot-S Take 1 tablet by mouth at bedtime.   simethicone 80 MG chewable tablet Commonly known as: Public relations account executive  1 tablet (80 mg total) by mouth every 6 (six) hours as needed for flatulence.   sodium chloride 0.65 % Soln nasal spray Commonly known as: OCEAN Place 1 spray into both nostrils as needed for congestion.   valACYclovir 1000 MG tablet Commonly known as: Valtrex Take 1 tablet (1,000 mg total) by mouth 3 (three) times daily.   ZINC PO Take 1 tablet by mouth once a week.        Review of Systems  Constitutional:  Negative for appetite change, chills, fatigue, fever and unexpected weight change.  HENT:  Negative for congestion, dental problem, ear discharge, ear pain, facial swelling, hearing loss, nosebleeds, postnasal drip, rhinorrhea, sinus pressure, sinus pain, sneezing, sore throat, tinnitus and trouble swallowing.   Eyes:  Negative for pain, discharge, redness, itching and visual disturbance.  Respiratory:  Negative for cough, chest tightness, shortness of breath and wheezing.   Cardiovascular:  Negative for chest pain, palpitations and leg swelling.  Gastrointestinal:  Negative for abdominal distention, abdominal pain, blood in stool, constipation, diarrhea, nausea and vomiting.  Endocrine: Negative for cold  intolerance, heat intolerance, polydipsia, polyphagia and polyuria.  Genitourinary:  Negative for difficulty urinating, dysuria, flank pain, frequency and urgency.  Musculoskeletal:  Negative for arthralgias, back pain, gait problem, joint swelling, myalgias, neck pain and neck stiffness.  Skin:  Negative for color change, pallor, rash and wound.  Neurological:  Negative for dizziness, syncope, speech difficulty, weakness, light-headedness, numbness and headaches.  Hematological:  Does not bruise/bleed easily.  Psychiatric/Behavioral:  Negative for agitation, behavioral problems, confusion, hallucinations, self-injury, sleep disturbance and suicidal ideas. The patient is not nervous/anxious.     Immunization History  Administered Date(s) Administered   Fluad Quad(high Dose 65+) 08/29/2019, 09/02/2020, 09/28/2021, 08/23/2022   Influenza Split 08/25/2010, 08/26/2011, 10/04/2012   Influenza Whole 09/24/2009   Influenza, High Dose Seasonal PF 08/16/2018   Influenza,inj,Quad PF,6+ Mos 09/11/2013, 09/04/2014, 11/12/2015, 10/04/2016, 09/06/2017   Influenza-Unspecified 09/02/2018   PFIZER(Purple Top)SARS-COV-2 Vaccination 02/11/2020, 03/03/2020, 09/17/2020, 07/04/2021, 10/09/2021   Pneumococcal Conjugate-13 02/17/2012   Pneumococcal Polysaccharide-23 05/19/2016   Tdap 02/17/2012   Zoster Recombinant(Shingrix) 10/15/2021, 02/02/2022   Pertinent  Health Maintenance Due  Topic Date Due   OPHTHALMOLOGY EXAM  10/13/2022   FOOT EXAM  11/03/2022   INFLUENZA VACCINE  07/14/2023   HEMOGLOBIN A1C  01/07/2024   DEXA SCAN  Completed      07/09/2022   10:25 AM 09/01/2022    2:33 PM 09/08/2022    2:18 PM 01/11/2023    9:19 AM 01/17/2023    8:56 AM  Fall Risk  Falls in the past year? 0 0 0 0 0  Was there an injury with Fall? 0 0 0 0 0  Fall Risk Category Calculator 0 0 0 0 0  Fall Risk Category (Retired) Low Low Low    (RETIRED) Patient Fall Risk Level Low fall risk Low fall risk Low fall risk     Patient at Risk for Falls Due to No Fall Risks No Fall Risks No Fall Risks No Fall Risks No Fall Risks  Fall risk Follow up Falls evaluation completed Falls evaluation completed Falls evaluation completed Falls evaluation completed Falls evaluation completed   Functional Status Survey:    Vitals:   07/14/23 1311 07/14/23 1318  BP: (!) 140/80 (!) 140/80  Pulse:  62  Resp:  18  Temp:  (!) 96.9 F (36.1 C)  SpO2:  93%  Weight:  163 lb (73.9 kg)  Height: 5\' 1"  (1.549 m) 5'  1" (1.549 m)   Body mass index is 30.8 kg/m. Physical Exam Vitals reviewed.  Constitutional:      General: She is not in acute distress.    Appearance: Normal appearance. She is normal weight. She is not ill-appearing or diaphoretic.  HENT:     Head: Normocephalic.     Right Ear: Tympanic membrane, ear canal and external ear normal. There is no impacted cerumen.     Left Ear: Tympanic membrane, ear canal and external ear normal. There is no impacted cerumen.     Nose: Nose normal. No congestion or rhinorrhea.     Mouth/Throat:     Mouth: Mucous membranes are moist.     Pharynx: Oropharynx is clear. No oropharyngeal exudate or posterior oropharyngeal erythema.  Eyes:     General: No scleral icterus.       Right eye: No discharge.        Left eye: No discharge.     Extraocular Movements: Extraocular movements intact.     Conjunctiva/sclera: Conjunctivae normal.     Pupils: Pupils are equal, round, and reactive to light.  Neck:     Vascular: No carotid bruit.  Cardiovascular:     Rate and Rhythm: Normal rate and regular rhythm.     Pulses: Normal pulses.     Heart sounds: Normal heart sounds. No murmur heard.    No friction rub. No gallop.  Pulmonary:     Effort: Pulmonary effort is normal. No respiratory distress.     Breath sounds: Normal breath sounds. No wheezing, rhonchi or rales.  Chest:     Chest wall: No tenderness.  Abdominal:     General: Bowel sounds are normal. There is no distension.      Palpations: Abdomen is soft. There is no mass.     Tenderness: There is no abdominal tenderness. There is no right CVA tenderness, left CVA tenderness, guarding or rebound.  Musculoskeletal:        General: No swelling or tenderness. Normal range of motion.     Cervical back: Normal range of motion. No rigidity or tenderness.     Right lower leg: No edema.     Left lower leg: No edema.  Lymphadenopathy:     Cervical: No cervical adenopathy.  Skin:    General: Skin is warm and dry.     Coloration: Skin is not pale.     Findings: No bruising, erythema, lesion or rash.  Neurological:     Mental Status: She is alert and oriented to person, place, and time.     Cranial Nerves: No cranial nerve deficit.     Sensory: No sensory deficit.     Motor: No weakness.     Coordination: Coordination normal.     Gait: Gait normal.  Psychiatric:        Mood and Affect: Mood normal.        Speech: Speech normal.        Behavior: Behavior normal.        Thought Content: Thought content normal.        Judgment: Judgment normal.     Labs reviewed: Recent Labs    01/06/23 1023 07/07/23 0859  NA 139 138  K 4.2 4.4  CL 105 106  CO2 23 27  GLUCOSE 112* 128*  BUN 11 16  CREATININE 0.74 0.61  CALCIUM 9.6 9.5   Recent Labs    01/06/23 1023 07/07/23 0859  AST 12 14  ALT 12 14  BILITOT 0.4 0.5  PROT 7.5 7.3   Recent Labs    01/06/23 1023 07/07/23 0859  WBC 5.7 5.5  NEUTROABS 2,445 2,096  HGB 14.1 13.7  HCT 41.2 41.8  MCV 76.7* 78.4*  PLT 175 162   Lab Results  Component Value Date   TSH 1.58 07/07/2023   Lab Results  Component Value Date   HGBA1C 6.5 (H) 07/07/2023   Lab Results  Component Value Date   CHOL 162 07/07/2023   HDL 59 07/07/2023   LDLCALC 89 07/07/2023   TRIG 55 07/07/2023   CHOLHDL 2.7 07/07/2023    Significant Diagnostic Results in last 30 days:  No results found.  Assessment/Plan 1. Essential hypertension B/p slightly elevated compared to previous  visit.  No home readings for comparison. -Will have her check blood pressure at home and notify provider if greater than 140/90 will increase amlodipine to 10 mg daily if blood pressure is still high -Continue on losartan, amlodipine and carvedilol -Continue with dietary modification and exercise - TSH; Future - COMPLETE METABOLIC PANEL WITH GFR; Future - CBC with Differential/Platelet; Future - CBC with Differential/Platelet - COMPLETE METABOLIC PANEL WITH GFR - TSH  2. Hyperlipidemia LDL goal <100 LDL at goal -Continue on ezetimibe  - Lipid panel; Future - ezetimibe (ZETIA) 10 MG tablet; Take 1 tablet (10 mg total) by mouth daily.  Dispense: 90 tablet; Refill: 1 - Lipid panel  3. Gastroesophageal reflux disease without esophagitis Symptoms controlled. H/H stable.No tarry or black stool  - advised to avoid eating meals late in the evening and to avoid aggravating foods and spices. - continue on Protonix   4. Type 2 diabetes mellitus with diabetic polyneuropathy, without long-term current use of insulin (HCC) Lab Results  Component Value Date   HGBA1C 6.5 (H) 07/07/2023  - continue on saxagliptin  - continue to follow up with opthalmology for annual eye exam and podiatrist for foot exam appointment in November,2024  - monofilament sensation exam intact  - Hemoglobin A1c; Future - Hemoglobin A1c  5. Primary osteoarthritis of right hand Continue current pain management - Ambulatory referral to Orthopedic Surgery   Family/ staff Communication: Reviewed plan of care with patient  Labs/tests ordered:  - TSH; Future - COMPLETE METABOLIC PANEL WITH GFR; Future - CBC with Differential/Platelet; Future - CBC with Differential/Platelet - COMPLETE METABOLIC PANEL WITH GFR - TSH - Hemoglobin A1c; Future - Hemoglobin A1c - Lipid panel  Next Appointment : Return in about 6 months (around 01/14/2024) for medical mangement of chronic issues., Fasting labs in 6 months prior to  visit.   Caesar Bookman, NP

## 2023-08-08 ENCOUNTER — Ambulatory Visit (INDEPENDENT_AMBULATORY_CARE_PROVIDER_SITE_OTHER): Payer: Medicare Other | Admitting: Orthopedic Surgery

## 2023-08-08 DIAGNOSIS — M1811 Unilateral primary osteoarthritis of first carpometacarpal joint, right hand: Secondary | ICD-10-CM | POA: Diagnosis not present

## 2023-08-08 NOTE — Progress Notes (Signed)
Tanya Bell - 82 y.o. female MRN [id number]  Date of birth: 11/08/1941  Office Visit Note: Visit Date: 08/08/2023 PCP: Caesar Bookman, NP Referred by: Caesar Bookman, NP  Subjective: Chief Complaint  Patient presents with   Right Hand - Pain   HPI: Tanya Bell is a pleasant 82 y.o. female who presents today for evaluation of ongoing pain and soreness at the right thumb basilar joint that has been present for multiple years, worsening in nature.  She denies any numbness or tingling.  She is right-hand dominant.  She is very active and independent at baseline.  She is a well-controlled diabetic, recent A1c 6.5.  Pertinent ROS were reviewed with the patient and found to be negative unless otherwise specified above in HPI.   Visit Reason: right thumb/wrist Hand dominance: right Occupation: N/A Diabetic: Yes/ 6.5 Heart/Lung History: none Blood Thinners: none  Prior Testing/EMG: xray 06/13/23 Injections (Date): none  Treatments: none Prior Surgery: none   6 months of pain Pain over [facility]; states it looks "swollen"  Assessment & Plan: Visit Diagnoses: No diagnosis found.  Plan: Extensive discussion was held with patient today regarding her right thumb basilar joint pain.  I reviewed the results of her previous imaging which is consistent with her clinical examination today.  She is demonstrating significant degenerative change of the right thumb basilar joint with associated pain.  Given that she has not trialed any significant conservative treatments to date, we will begin with Comfort Cool bracing for the time being.  I did also offer her a cortisone injection today to the right thumb CMC joint, however she would like to first trial bracing and then will return in the future for potential injection which I am in agreement with.  I did outline for her the potential surgical measures in the form of [facility] arthroplasty in the future should her symptoms remain refractory to  conservative care.  We discussed the treatment from a surgical standpoint as well as the postoperative protocol.  Follow-up: Return in about 6 weeks (around 09/19/2023) for Possible cortisone injection right thumb CMC.   Meds & Orders: No orders of the defined types were placed in this encounter.  No orders of the defined types were placed in this encounter.    Procedures: No procedures performed      Clinical History: No specialty comments available.  She reports that she quit smoking about 32 years ago. Her smoking use included cigarettes. She has never used smokeless tobacco.  Recent Labs    01/06/23 1023 07/07/23 0859  HGBA1C 6.6* 6.5*    Objective:   Vital Signs: There were no vitals taken for this visit.  Physical Exam  Gen: Well-appearing, in no acute distress; non-toxic CV: Regular Rate. Well-perfused. Warm.  Resp: Breathing unlabored on room air; no wheezing. Psych: Fluid speech in conversation; appropriate affect; normal thought process  Ortho Exam General: Patient is well appearing and in no distress. Cervical spine mobility is full in all directions:   Skin and Muscle: No skin changes are apparent to upper extremities.  Muscle bulk and contour normal, no signs of atrophy.      Range of Motion and Palpation Tests: Mobility is full about the elbows with flexion and extension.  Forearm supination and pronation are 85/85 bilaterally.  Wrist flexion/extension is 75/65 bilaterally.  Digital flexion and extension are full.  Thumb opposition is full to the base of the small fingers bilaterally.     No cords  or nodules are palpated.  No triggering is observed.     Significant tenderness over the bilateral thumb CMC articulation is observed, right worse than left.  Positive grind, positive crepitus with CMC grind bilaterally.  MP hyperextension negative bilaterally.      Neurologic, Vascular, Motor: Sensation is intact to light touch in the median/radial/ulnar  distributions.  Tinel's testing negative at wrist level. Phalen's negative, Derkan's compression negative.  Fingers pink and well perfused.  Capillary refill is brisk.     Imaging: No results found.  Past Medical/Family/Surgical/Social History: Medications & Allergies reviewed per EMR, new medications updated. Patient Active Problem List   Diagnosis Date Noted   Cystoid macular edema of right eye 08/12/2022   Epiretinal membrane (ERM) of left eye 08/12/2022   History of retinal detachment 08/12/2022   Type 2 diabetes mellitus without retinopathy (HCC) 08/12/2022   Diabetes mellitus type 2, controlled (HCC) 02/04/2015   Varicose veins of leg with edema 02/04/2015   Unspecified essential hypertension 05/07/2014   HTN (hypertension) 03/06/2013   Vaginal dryness, menopausal 03/06/2013   Eczema 03/06/2013   Annual physical exam 03/06/2013   Herpes simplex with other ophthalmic complications 01/04/2013   Disorder of bone and cartilage, unspecified 01/04/2013   Reflux esophagitis 01/04/2013   Other cataract 01/04/2013   Sickle cell anemia (HCC) 01/04/2013   Thrombocytopenia (HCC) 01/04/2013   Other dystrophy of vulva 01/04/2013   Diverticulitis of colon (without mention of hemorrhage)(562.11) 01/04/2013   Unspecified disorder of skin and subcutaneous tissue 01/04/2013   Type II or unspecified type diabetes mellitus without mention of complication, not stated as uncontrolled 01/04/2013   Hyperlipidemia LDL goal <100 01/04/2013   Seasonal allergic rhinitis due to pollen 01/04/2013   Extrinsic asthma, unspecified asthma severity, uncomplicated 01/04/2013   Other malaise and fatigue 01/04/2013   Other abnormal glucose 01/04/2013   Past Medical History:  Diagnosis Date   Acute upper respiratory infections of unspecified site    Acute upper respiratory infections of unspecified site    Acute upper respiratory infections of unspecified site    Allergic rhinitis due to pollen     Candidiasis of mouth    Disorder of bone and cartilage, unspecified    Diverticulitis of colon (without mention of hemorrhage)(562.11)    Essential hypertension, benign    Essential hypertension, benign    Extrinsic asthma, unspecified    Herpes simplex with other ophthalmic complications    Other abnormal glucose    Other and unspecified hyperlipidemia    Other and unspecified hyperlipidemia    Other and unspecified hyperlipidemia    Other cataract    Other cataract    Other dystrophy of vulva    Other malaise and fatigue    Rash and other nonspecific skin eruption    Reflux esophagitis    Thrombocytopenia, unspecified (HCC)    Type II or unspecified type diabetes mellitus without mention of complication, not stated as uncontrolled    Type II or unspecified type diabetes mellitus without mention of complication, not stated as uncontrolled    Unspecified disorder of skin and subcutaneous tissue    Unspecified essential hypertension    Unspecified essential hypertension    Family History  Problem Relation Age of Onset   Cancer Mother        brain   Stroke Father    Past Surgical History:  Procedure Laterality Date   ABDOMINAL HYSTERECTOMY     CESAREAN SECTION     [phone]   CYST EXCISION  1974   Pilonidal Cyst excision   EYE SURGERY Right 04/14/2017   Dr. Luciana Axe   KNEE ARTHROSCOPY Right    for meniscal tear   RETINAL TEAR REPAIR CRYOTHERAPY  10/21/2017   Getting injections in right eye   Social History   Occupational History   Not on file  Tobacco Use   Smoking status: Former    Current packs/day: 0.00    Types: Cigarettes    Quit date: 12/13/1990    Years since quitting: 32.6   Smokeless tobacco: Never  Vaping Use   Vaping status: Never Used  Substance and Sexual Activity   Alcohol use: No    Alcohol/week: 0.0 standard drinks of alcohol   Drug use: No   Sexual activity: Yes    Partners: Male    Comment: None     Saleen Peden Trevor Mace,  M.D. [facility] 10:36 AM

## 2023-08-11 DIAGNOSIS — Z8669 Personal history of other diseases of the nervous system and sense organs: Secondary | ICD-10-CM | POA: Diagnosis not present

## 2023-08-11 DIAGNOSIS — Z9889 Other specified postprocedural states: Secondary | ICD-10-CM | POA: Diagnosis not present

## 2023-08-11 DIAGNOSIS — H35372 Puckering of macula, left eye: Secondary | ICD-10-CM | POA: Diagnosis not present

## 2023-08-11 DIAGNOSIS — H44709 Unspecified retained (old) intraocular foreign body, nonmagnetic, unspecified eye: Secondary | ICD-10-CM | POA: Diagnosis not present

## 2023-08-11 DIAGNOSIS — E119 Type 2 diabetes mellitus without complications: Secondary | ICD-10-CM | POA: Diagnosis not present

## 2023-08-11 DIAGNOSIS — H35371 Puckering of macula, right eye: Secondary | ICD-10-CM | POA: Diagnosis not present

## 2023-08-11 DIAGNOSIS — H3521 Other non-diabetic proliferative retinopathy, right eye: Secondary | ICD-10-CM | POA: Diagnosis not present

## 2023-08-11 DIAGNOSIS — D573 Sickle-cell trait: Secondary | ICD-10-CM | POA: Diagnosis not present

## 2023-08-11 DIAGNOSIS — H35351 Cystoid macular degeneration, right eye: Secondary | ICD-10-CM | POA: Diagnosis not present

## 2023-08-19 DIAGNOSIS — Z23 Encounter for immunization: Secondary | ICD-10-CM | POA: Diagnosis not present

## 2023-09-19 ENCOUNTER — Ambulatory Visit (INDEPENDENT_AMBULATORY_CARE_PROVIDER_SITE_OTHER): Payer: Medicare Other | Admitting: Orthopedic Surgery

## 2023-09-19 DIAGNOSIS — M1811 Unilateral primary osteoarthritis of first carpometacarpal joint, right hand: Secondary | ICD-10-CM

## 2023-09-19 DIAGNOSIS — M1812 Unilateral primary osteoarthritis of first carpometacarpal joint, left hand: Secondary | ICD-10-CM

## 2023-09-19 NOTE — Progress Notes (Signed)
MARELLA Bell - 82 y.o. female MRN [id number]  Date of birth: 11-14-1941  Office Visit Note: Visit Date: 09/19/2023 PCP: Caesar Bookman, NP Referred by: Caesar Bookman, NP  Subjective: No chief complaint on file.  HPI: Tanya Bell is a pleasant 82 y.o. female who presents today for follow-up of right thumb CMC osteoarthritis.  Currently being treated with Comfort Cool bracing, which she feels is helping.  She denies any numbness or tingling.  She is right-hand dominant.  She is very active and independent at baseline.  She is a well-controlled diabetic, recent A1c 6.5.  Also is developing pain at the left thumb basilar joint as well more recently.  No significant numbness or tingling on that side either.  Pertinent ROS were reviewed with the patient and found to be negative unless otherwise specified above in HPI.    Assessment & Plan: Visit Diagnoses: No diagnosis found.  Plan: Extensive discussion was once again held with patient today regarding her right thumb basilar joint pain.  I reviewed the results of her previous imaging which is consistent with her clinical examination today.  I am pleased to see that she is doing well with the Comfort Cool bracing for the time being.  I did also once again offer her a cortisone injection today to the right thumb CMC joint, however she would like to continue with bracing and then will return in the future for potential injection which I am in agreement with.  I did outline for her the potential surgical measures in the form of [facility] arthroplasty in the future should her symptoms remain refractory to conservative care.  We discussed the treatment from a surgical standpoint as well as the postoperative protocol.  For the left side, she is demonstrating clinical signs symptoms consistent with left thumb CMC arthritis as well.  We can begin with Comfort Cool bracing for the left side as well, this was provided today.  Follow-up: No  follow-ups on file.   Meds & Orders: No orders of the defined types were placed in this encounter.  No orders of the defined types were placed in this encounter.    Procedures: No procedures performed      Clinical History: No specialty comments available.  She reports that she quit smoking about 32 years ago. Her smoking use included cigarettes. She has never used smokeless tobacco.  Recent Labs    01/06/23 1023 07/07/23 0859  HGBA1C 6.6* 6.5*    Objective:   Vital Signs: There were no vitals taken for this visit.  Physical Exam  Gen: Well-appearing, in no acute distress; non-toxic CV: Regular Rate. Well-perfused. Warm.  Resp: Breathing unlabored on room air; no wheezing. Psych: Fluid speech in conversation; appropriate affect; normal thought process  Ortho Exam General: Patient is well appearing and in no distress. Cervical spine mobility is full in all directions:   Skin and Muscle: No skin changes are apparent to upper extremities.  Muscle bulk and contour normal, no signs of atrophy.      Range of Motion and Palpation Tests: Mobility is full about the elbows with flexion and extension.  Forearm supination and pronation are 85/85 bilaterally.  Wrist flexion/extension is 75/65 bilaterally.  Digital flexion and extension are full.  Thumb opposition is full to the base of the small fingers bilaterally.     No cords or nodules are palpated.  No triggering is observed.     Significant tenderness over the bilateral thumb CMC  articulation is observed, right worse than left.  Positive grind, positive crepitus with CMC grind bilaterally.  MP hyperextension negative bilaterally.      Neurologic, Vascular, Motor: Sensation is intact to light touch in the median/radial/ulnar distributions.  Tinel's testing negative at wrist level. Phalen's negative, Derkan's compression negative.  Fingers pink and well perfused.  Capillary refill is brisk.     Imaging: No results  found.  Past Medical/Family/Surgical/Social History: Medications & Allergies reviewed per EMR, new medications updated. Patient Active Problem List   Diagnosis Date Noted   Cystoid macular edema of right eye 08/12/2022   Epiretinal membrane (ERM) of left eye 08/12/2022   History of retinal detachment 08/12/2022   Type 2 diabetes mellitus without retinopathy (HCC) 08/12/2022   Diabetes mellitus type 2, controlled (HCC) 02/04/2015   Varicose veins of leg with edema 02/04/2015   Essential hypertension 05/07/2014   HTN (hypertension) 03/06/2013   Vaginal dryness, menopausal 03/06/2013   Eczema 03/06/2013   Annual physical exam 03/06/2013   Herpes simplex with other ophthalmic complications 01/04/2013   Disorder of bone and cartilage 01/04/2013   Reflux esophagitis 01/04/2013   Other cataract 01/04/2013   Sickle cell anemia (HCC) 01/04/2013   Thrombocytopenia (HCC) 01/04/2013   Other dystrophy of vulva 01/04/2013   Diverticulitis of colon (without mention of hemorrhage)(562.11) 01/04/2013   Disorder of skin or subcutaneous tissue 01/04/2013   Type II or unspecified type diabetes mellitus without mention of complication, not stated as uncontrolled 01/04/2013   Hyperlipidemia LDL goal <100 01/04/2013   Seasonal allergic rhinitis due to pollen 01/04/2013   Extrinsic asthma, unspecified asthma severity, uncomplicated 01/04/2013   Other malaise and fatigue 01/04/2013   Other abnormal glucose 01/04/2013   Past Medical History:  Diagnosis Date   Acute upper respiratory infections of unspecified site    Acute upper respiratory infections of unspecified site    Acute upper respiratory infections of unspecified site    Allergic rhinitis due to pollen    Candidiasis of mouth    Disorder of bone and cartilage, unspecified    Diverticulitis of colon (without mention of hemorrhage)(562.11)    Essential hypertension, benign    Essential hypertension, benign    Extrinsic asthma, unspecified     Herpes simplex with other ophthalmic complications    Other abnormal glucose    Other and unspecified hyperlipidemia    Other and unspecified hyperlipidemia    Other and unspecified hyperlipidemia    Other cataract    Other cataract    Other dystrophy of vulva    Other malaise and fatigue    Rash and other nonspecific skin eruption    Reflux esophagitis    Thrombocytopenia, unspecified (HCC)    Type II or unspecified type diabetes mellitus without mention of complication, not stated as uncontrolled    Type II or unspecified type diabetes mellitus without mention of complication, not stated as uncontrolled    Unspecified disorder of skin and subcutaneous tissue    Unspecified essential hypertension    Unspecified essential hypertension    Family History  Problem Relation Age of Onset   Cancer Mother        brain   Stroke Father    Past Surgical History:  Procedure Laterality Date   ABDOMINAL HYSTERECTOMY     CESAREAN SECTION     [phone]   CYST EXCISION  1974   Pilonidal Cyst excision   EYE SURGERY Right 04/14/2017   Dr. Luciana Axe   KNEE ARTHROSCOPY Right  for meniscal tear   RETINAL TEAR REPAIR CRYOTHERAPY  10/21/2017   Getting injections in right eye   Social History   Occupational History   Not on file  Tobacco Use   Smoking status: Former    Current packs/day: 0.00    Types: Cigarettes    Quit date: 12/13/1990    Years since quitting: 32.7   Smokeless tobacco: Never  Vaping Use   Vaping status: Never Used  Substance and Sexual Activity   Alcohol use: No    Alcohol/week: 0.0 standard drinks of alcohol   Drug use: No   Sexual activity: Yes    Partners: Male    Comment: None     Kynzleigh Bandel Fara Boros) Denese Killings, M.D.  [facility] 11:15 AM

## 2023-10-04 ENCOUNTER — Ambulatory Visit (INDEPENDENT_AMBULATORY_CARE_PROVIDER_SITE_OTHER): Payer: Medicare Other | Admitting: Podiatry

## 2023-10-04 ENCOUNTER — Encounter: Payer: Self-pay | Admitting: Podiatry

## 2023-10-04 DIAGNOSIS — E119 Type 2 diabetes mellitus without complications: Secondary | ICD-10-CM

## 2023-10-04 DIAGNOSIS — M79675 Pain in left toe(s): Secondary | ICD-10-CM

## 2023-10-04 DIAGNOSIS — L84 Corns and callosities: Secondary | ICD-10-CM | POA: Diagnosis not present

## 2023-10-04 DIAGNOSIS — B351 Tinea unguium: Secondary | ICD-10-CM | POA: Diagnosis not present

## 2023-10-04 DIAGNOSIS — M79674 Pain in right toe(s): Secondary | ICD-10-CM

## 2023-10-04 NOTE — Progress Notes (Signed)
Subjective:  Patient ID: Tanya Bell, female    DOB: 1941/09/23,   MRN: [id number]  Chief Complaint  Patient presents with   [facility]    RM#23 [facility] patient has no concerns     82 y.o. female presents for concern of thickened elongated and painful nails that are difficult to trim. Requesting to have them trimmed today. Relates burning and tingling in their feet. Patient is diabetic and last A1c was  Lab Results  Component Value Date   HGBA1C 6.5 (H) 07/07/2023   .   PCP:  Ngetich, Donalee Citrin, NP    . Denies any other pedal complaints. Denies n/v/f/c.   Past Medical History:  Diagnosis Date   Acute upper respiratory infections of unspecified site    Acute upper respiratory infections of unspecified site    Acute upper respiratory infections of unspecified site    Allergic rhinitis due to pollen    Candidiasis of mouth    Disorder of bone and cartilage, unspecified    Diverticulitis of colon (without mention of hemorrhage)(562.11)    Essential hypertension, benign    Essential hypertension, benign    Extrinsic asthma, unspecified    Herpes simplex with other ophthalmic complications    Other abnormal glucose    Other and unspecified hyperlipidemia    Other and unspecified hyperlipidemia    Other and unspecified hyperlipidemia    Other cataract    Other cataract    Other dystrophy of vulva    Other malaise and fatigue    Rash and other nonspecific skin eruption    Reflux esophagitis    Thrombocytopenia, unspecified (HCC)    Type II or unspecified type diabetes mellitus without mention of complication, not stated as uncontrolled    Type II or unspecified type diabetes mellitus without mention of complication, not stated as uncontrolled    Unspecified disorder of skin and subcutaneous tissue    Unspecified essential hypertension    Unspecified essential hypertension     Objective:  Physical Exam: Vascular: DP/PT pulses 2/4 bilateral. CFT <3 seconds. Absent hair growth on  digits. Edema noted to bilateral lower extremities. Xerosis noted bilaterally.  Skin. No lacerations or abrasions bilateral feet. Nails 1-5 bilateral  are thickened discolored and elongated with subungual debris. Hyperkeratotic lesion noted sub fifth metatarsal bilateral and sub first metatarsal on right  Musculoskeletal: MMT 5/5 bilateral lower extremities in DF, PF, Inversion and Eversion. Deceased ROM in DF of ankle joint. Bilateral HAV deformitty and hammered digits 2-5 bilateral.  Neurological: Sensation intact to light touch. Protective sensation diminished bilateral. ]   Assessment:   1. Pain due to onychomycosis of toenails of both feet   2. Controlled type 2 diabetes mellitus without complication, without long-term current use of insulin (HCC)   3. Callus      Plan:  Patient was evaluated and treated and all questions answered. -Discussed and educated patient on diabetic foot care, especially with  regards to the vascular, neurological and musculoskeletal systems.  -Stressed the importance of good glycemic control and the detriment of not  controlling glucose levels in relation to the foot. -Discussed supportive shoes at all times and checking feet regularly.  -Mechanically debrided all nails 1-5 bilateral using sterile nail nipper and filed with dremel without incident  -Hyperkeratotic area debrided without incident with chisel.  -Answered all patient questions -Patient to return  in 3 months for at risk foot care -Patient advised to call the office if any problems or questions arise in  the meantime.   Tanya Bell, DPM

## 2023-10-10 ENCOUNTER — Ambulatory Visit: Payer: Medicare Other | Admitting: Family

## 2023-10-10 DIAGNOSIS — Z23 Encounter for immunization: Secondary | ICD-10-CM | POA: Diagnosis not present

## 2023-10-16 NOTE — Progress Notes (Signed)
Flut shot administered by CMA no acute reaction reported.   

## 2023-11-09 ENCOUNTER — Telehealth: Payer: Self-pay

## 2023-11-09 DIAGNOSIS — I10 Essential (primary) hypertension: Secondary | ICD-10-CM

## 2023-11-09 MED ORDER — CARVEDILOL 12.5 MG PO TABS: ORAL_TABLET | ORAL | 3 refills | Status: DC

## 2023-11-09 NOTE — Telephone Encounter (Signed)
Patient called and required refill on Coreg 12.5 mg and refill request was send into pharmacy.

## 2023-12-19 LAB — HM DIABETES EYE EXAM

## 2024-01-04 ENCOUNTER — Ambulatory Visit: Payer: Medicare Other | Admitting: Podiatry

## 2024-01-12 ENCOUNTER — Other Ambulatory Visit: Payer: Medicare Other

## 2024-01-12 DIAGNOSIS — E785 Hyperlipidemia, unspecified: Secondary | ICD-10-CM | POA: Diagnosis not present

## 2024-01-12 DIAGNOSIS — I10 Essential (primary) hypertension: Secondary | ICD-10-CM | POA: Diagnosis not present

## 2024-01-12 DIAGNOSIS — E1142 Type 2 diabetes mellitus with diabetic polyneuropathy: Secondary | ICD-10-CM | POA: Diagnosis not present

## 2024-01-13 LAB — COMPLETE METABOLIC PANEL WITH GFR
AG Ratio: 1.3 (calc) (ref 1.0–2.5)
ALT: 11 U/L (ref 6–29)
AST: 12 U/L (ref 10–35)
Albumin: 4.3 g/dL (ref 3.6–5.1)
Alkaline phosphatase (APISO): 74 U/L (ref 37–153)
BUN: 16 mg/dL (ref 7–25)
CO2: 28 mmol/L (ref 20–32)
Calcium: 9.5 mg/dL (ref 8.6–10.4)
Chloride: 106 mmol/L (ref 98–110)
Creat: 0.73 mg/dL (ref 0.60–0.95)
Globulin: 3.2 g/dL (ref 1.9–3.7)
Glucose, Bld: 123 mg/dL — ABNORMAL HIGH (ref 65–99)
Potassium: 4.6 mmol/L (ref 3.5–5.3)
Sodium: 141 mmol/L (ref 135–146)
Total Bilirubin: 0.5 mg/dL (ref 0.2–1.2)
Total Protein: 7.5 g/dL (ref 6.1–8.1)
eGFR: 83 mL/min/{1.73_m2} (ref >=60)

## 2024-01-13 LAB — CBC WITH DIFFERENTIAL/PLATELET
Absolute Lymphocytes: 3239 {cells}/uL (ref 850–3900)
Absolute Monocytes: 543 {cells}/uL (ref 200–950)
Basophils Absolute: 43 {cells}/uL (ref 0–200)
Basophils Relative: 0.7 %
Eosinophils Absolute: 232 {cells}/uL (ref 15–500)
Eosinophils Relative: 3.8 %
HCT: 41.2 % (ref 35.0–45.0)
Hemoglobin: 13.5 g/dL (ref 11.7–15.5)
MCH: 25.4 pg — ABNORMAL LOW (ref 27.0–33.0)
MCHC: 32.8 g/dL (ref 32.0–36.0)
MCV: 77.6 fL — ABNORMAL LOW (ref 80.0–100.0)
MPV: 12.3 fL (ref 7.5–12.5)
Monocytes Relative: 8.9 %
Neutro Abs: 2044 {cells}/uL (ref 1500–7800)
Neutrophils Relative %: 33.5 %
Platelets: 202 10*3/uL (ref 140–400)
RBC: 5.31 10*6/uL — ABNORMAL HIGH (ref 3.80–5.10)
RDW: 15.7 % — ABNORMAL HIGH (ref 11.0–15.0)
Total Lymphocyte: 53.1 %
WBC: 6.1 10*3/uL (ref 3.8–10.8)

## 2024-01-13 LAB — HEMOGLOBIN A1C
Hgb A1c MFr Bld: 6.6 %{Hb} — ABNORMAL HIGH (ref <5.7)
Mean Plasma Glucose: 143 mg/dL
eAG (mmol/L): 7.9 mmol/L

## 2024-01-13 LAB — LIPID PANEL
Cholesterol: 187 mg/dL (ref <200)
HDL: 56 mg/dL (ref >=50)
LDL Cholesterol (Calc): 117 mg/dL — ABNORMAL HIGH
Non-HDL Cholesterol (Calc): 131 mg/dL — ABNORMAL HIGH (ref <130)
Total CHOL/HDL Ratio: 3.3 (calc) (ref <5.0)
Triglycerides: 53 mg/dL (ref <150)

## 2024-01-13 LAB — TSH: TSH: 2.28 m[IU]/L (ref 0.40–4.50)

## 2024-01-16 ENCOUNTER — Encounter: Payer: Self-pay | Admitting: Family

## 2024-01-16 ENCOUNTER — Ambulatory Visit (INDEPENDENT_AMBULATORY_CARE_PROVIDER_SITE_OTHER): Payer: Medicare Other | Admitting: Family

## 2024-01-16 VITALS — BP 134/82 | Temp 97.7°F | Resp 17 | Ht 61.0 in | Wt 163.2 lb

## 2024-01-16 DIAGNOSIS — E785 Hyperlipidemia, unspecified: Secondary | ICD-10-CM

## 2024-01-16 DIAGNOSIS — L84 Corns and callosities: Secondary | ICD-10-CM

## 2024-01-16 DIAGNOSIS — K5901 Slow transit constipation: Secondary | ICD-10-CM | POA: Diagnosis not present

## 2024-01-16 DIAGNOSIS — K219 Gastro-esophageal reflux disease without esophagitis: Secondary | ICD-10-CM | POA: Diagnosis not present

## 2024-01-16 DIAGNOSIS — E1142 Type 2 diabetes mellitus with diabetic polyneuropathy: Secondary | ICD-10-CM | POA: Diagnosis not present

## 2024-01-16 DIAGNOSIS — I10 Essential (primary) hypertension: Secondary | ICD-10-CM | POA: Diagnosis not present

## 2024-01-16 MED ORDER — PANTOPRAZOLE SODIUM 40 MG PO TBEC: 40.0000 mg | DELAYED_RELEASE_TABLET | Freq: Every day | ORAL | Status: DC | PRN

## 2024-01-16 NOTE — Progress Notes (Signed)
Provider: Richarda Blade FNP-C   Ajamu Maxon, Donalee Citrin, NP  Patient Care Team: Morenike Cuff, Donalee Citrin, NP as PCP - General (Family Medicine) Dorena Cookey, MD (Inactive) as Consulting Physician (Gastroenterology) Sallye Lat, MD as Consulting Physician (Ophthalmology) Luciana Axe Alford Highland, MD as Consulting Physician (Ophthalmology) Aggie Cosier, MD as Referring Physician (Psychiatry) Ria Clock (Inactive) (Orthotics)  Extended Emergency Contact Information Primary Emergency Contact: JAMES,THOMAS Home Phone: [phone] Relation: None  Code Status:  Full Code  Goals of care: Advanced Directive information    01/16/2024    9:03 AM  Advanced Directives  Does Patient Have a Medical Advance Directive? No  Would patient like information on creating a medical advance directive? No - Patient declined     Chief Complaint  Patient presents with   Medical Management of Chronic Issues    6 month follow up and discuss eye exam,Diabetic kidney evaluation,foot exam,Medicare annual Wellness Visit,tdap,and covid vaccines.    Discussed the use of AI scribe software for clinical note transcription with the patient, who gave verbal consent to proceed.  History of Present Illness   The patient presents for 6 months routine follow-up regarding diabetes management and medication review.  She is concerned about her glucose levels, noting higher blood sugar readings in the mornings, sometimes reaching 99. Her hemoglobin A1c has increased from 6.5 to 6.6 over the past six months. She is currently taking Onglyza (saxagliptin) 2.5 mg daily for diabetes management.  She is concerned about her cholesterol levels, noting an increase in total cholesterol from 162 to 187 and LDL from 89 to 117. She attributes this to dietary changes, specifically increased Maccaroni cheese consumption. She takes Zetia for cholesterol management.  Her medication regimen includes various medications as needed, such as Nasonex and  saline spray for nasal symptoms, preparation H for hemorrhoids, and Protonix for acid reflux, although she prefers using vinegar with water for acid relief. She also uses albuterol as needed for respiratory symptoms and has not needed Symbicort recently. She is not currently taking Valtrex (valacyclovir) as it was prescribed for a past breakout. She occasionally experiences skin irritation and breakouts, described as 'a bump' or 'boil bump.' She is not taking a probiotic or multivitamin regularly but plans to restock her vitamins, including B12 and magnesium.  She experiences constipation and uses Senokot as needed. She also takes Gas-X for gas relief. She inquires about the use of magnesium citrate versus magnesium oxide for bowel regularity.  She reports arthritis pain, particularly in the hands, and uses ibuprofen for pain relief, which she finds more effective than Tylenol. She wears a glove at night to help with arthritis/Carpal Tunnel in her hands, which affects both hands now.  She expresses feelings of depression related to her inability to drive and the demands of caring for her husband, who has had cancer and a stroke. She does not have local family support, as her children live in other states. She declines antidepressant.     .     Past Medical History:  Diagnosis Date   Acute upper respiratory infections of unspecified site    Acute upper respiratory infections of unspecified site    Acute upper respiratory infections of unspecified site    Allergic rhinitis due to pollen    Candidiasis of mouth    Disorder of bone and cartilage, unspecified    Diverticulitis of colon (without mention of hemorrhage)(562.11)    Essential hypertension, benign    Essential hypertension, benign    Extrinsic asthma, unspecified  Herpes simplex with other ophthalmic complications    Other abnormal glucose    Other and unspecified hyperlipidemia    Other and unspecified hyperlipidemia    Other and  unspecified hyperlipidemia    Other cataract    Other cataract    Other dystrophy of vulva    Other malaise and fatigue    Rash and other nonspecific skin eruption    Reflux esophagitis    Thrombocytopenia, unspecified (HCC)    Type II or unspecified type diabetes mellitus without mention of complication, not stated as uncontrolled    Type II or unspecified type diabetes mellitus without mention of complication, not stated as uncontrolled    Unspecified disorder of skin and subcutaneous tissue    Unspecified essential hypertension    Unspecified essential hypertension    Past Surgical History:  Procedure Laterality Date   ABDOMINAL HYSTERECTOMY     CESAREAN SECTION     [phone]   CYST EXCISION  1974   Pilonidal Cyst excision   EYE SURGERY Right 04/14/2017   Dr. Luciana Axe   KNEE ARTHROSCOPY Right    for meniscal tear   RETINAL TEAR REPAIR CRYOTHERAPY  10/21/2017   Getting injections in right eye    Allergies  Allergen Reactions   Crestor [Rosuvastatin Calcium]     Muscle cramps   Latex Other (See Comments)    GLOVES   Sulfa Antibiotics     Allergies as of 01/16/2024       Reactions   Crestor [rosuvastatin Calcium]    Muscle cramps   Latex Other (See Comments)   GLOVES   Sulfa Antibiotics         Medication List        Accurate as of January 16, 2024  9:19 AM. If you have any questions, ask your nurse or doctor.          acetaminophen 325 MG tablet Commonly known as: TYLENOL Take 325 mg by mouth daily as needed. For pain   albuterol 108 (90 Base) MCG/ACT inhaler Commonly known as: Ventolin HFA Inhale 2 puffs into the lungs every 6 (six) hours as needed for wheezing or shortness of breath.   amLODipine 5 MG tablet Commonly known as: NORVASC TAKE 1 TABLET(5 MG) BY MOUTH DAILY   budesonide-formoterol 160-4.5 MCG/ACT inhaler Commonly known as: SYMBICORT Inhale 2 puffs into the lungs 2 (two) times daily. PLEASE GIVE PATIENT 3 MONTH SUPPLY.    calcium-vitamin D 500-200 MG-UNIT tablet Commonly known as: OSCAL WITH D Take 1 tablet by mouth 2 (two) times daily.   carvedilol 12.5 MG tablet Commonly known as: COREG TAKE 1 TABLET ONCE DAILY FOR BLOOD PRESSURE   cyanocobalamin 1000 MCG tablet Commonly known as: VITAMIN B12 Take 1 tablet (1,000 mcg total) by mouth daily.   ELDERBERRY PO Take by mouth 3 (three) times a week.   ezetimibe 10 MG tablet Commonly known as: ZETIA Take 1 tablet (10 mg total) by mouth daily.   fexofenadine 180 MG tablet Commonly known as: ALLEGRA Take 180 mg by mouth as needed.   gabapentin 100 MG capsule Commonly known as: NEURONTIN Take 1 capsule (100 mg total) by mouth 2 (two) times daily as needed. For pain   ketorolac 0.5 % ophthalmic solution Commonly known as: ACULAR Place 1 drop into the right eye 4 (four) times daily.   linaclotide 145 MCG Caps capsule Commonly known as: Linzess Take 1 capsule (145 mcg total) by mouth daily before breakfast.   losartan 100 MG tablet  Commonly known as: Cozaar Take 1 tablet (100 mg total) by mouth daily.   Magnesium Gluconate 250 MG Tabs Take 1 tablet (250 mg total) by mouth daily.   mometasone 50 MCG/ACT nasal spray Commonly known as: NASONEX One Spray each Nostril twice daily as needed for allergies.   pantoprazole 40 MG tablet Commonly known as: PROTONIX Take 1 tablet (40 mg total) by mouth daily.   phenylephrine 0.25 % suppository Commonly known as: (USE for PREPARATION-H) Place 1 suppository rectally as needed for hemorrhoids.   prednisoLONE acetate 1 % ophthalmic suspension Commonly known as: PRED FORTE Place 1 drop into the right eye 4 (four) times daily.   saccharomyces boulardii 250 MG capsule Commonly known as: Florastor Take 1 capsule (250 mg total) by mouth daily.   saxagliptin HCl 2.5 MG Tabs tablet Commonly known as: ONGLYZA Take 1 tablet (2.5 mg total) by mouth daily.   senna-docusate 8.6-50 MG tablet Commonly known  as: Senokot-S Take 1 tablet by mouth at bedtime.   simethicone 80 MG chewable tablet Commonly known as: Gas-X Chew 1 tablet (80 mg total) by mouth every 6 (six) hours as needed for flatulence.   sodium chloride 0.65 % Soln nasal spray Commonly known as: OCEAN Place 1 spray into both nostrils as needed for congestion.   valACYclovir 1000 MG tablet Commonly known as: Valtrex Take 1 tablet (1,000 mg total) by mouth 3 (three) times daily.   ZINC PO Take 1 tablet by mouth once a week.        Review of Systems  Constitutional:  Negative for appetite change, chills, fatigue, fever and unexpected weight change.  HENT:  Negative for congestion, dental problem, ear discharge, ear pain, hearing loss, nosebleeds, postnasal drip, rhinorrhea, sinus pressure, sinus pain, sneezing, sore throat, tinnitus and trouble swallowing.   Eyes:  Negative for pain, discharge, redness, itching and visual disturbance.  Respiratory:  Negative for cough, chest tightness, shortness of breath and wheezing.   Cardiovascular:  Negative for chest pain, palpitations and leg swelling.  Gastrointestinal:  Positive for constipation. Negative for abdominal distention, abdominal pain, blood in stool, diarrhea, nausea and vomiting.       Laxative effective   Endocrine: Negative for cold intolerance, heat intolerance, polydipsia, polyphagia and polyuria.  Genitourinary:  Negative for difficulty urinating, dysuria, flank pain, frequency and urgency.  Musculoskeletal:  Positive for arthralgias. Negative for back pain, gait problem, joint swelling, myalgias, neck pain and neck stiffness.       Arthritis and carpal tunnel pain on both hands   Skin:  Negative for color change, pallor, rash and wound.  Neurological:  Negative for dizziness, syncope, speech difficulty, weakness, light-headedness, numbness and headaches.  Hematological:  Does not bruise/bleed easily.  Psychiatric/Behavioral:  Negative for agitation, behavioral  problems, confusion, hallucinations, self-injury, sleep disturbance and suicidal ideas. The patient is not nervous/anxious.        Care giver burden     Immunization History  Administered Date(s) Administered   Fluad Quad(high Dose 65+) 08/29/2019, 09/02/2020, 09/28/2021, 08/23/2022   Fluad Trivalent(High Dose 65+) 10/10/2023   Influenza Split 08/25/2010, 08/26/2011, 10/04/2012   Influenza Whole 09/24/2009   Influenza, High Dose Seasonal PF 08/16/2018   Influenza,inj,Quad PF,6+ Mos 09/11/2013, 09/04/2014, 11/12/2015, 10/04/2016, 09/06/2017   Influenza-Unspecified 09/02/2018   Moderna Covid-19 Fall Seasonal Vaccine 56yrs & older 08/19/2023   PFIZER(Purple Top)SARS-COV-2 Vaccination 02/11/2020, 03/03/2020, 09/17/2020, 07/04/2021, 10/09/2021   Pneumococcal Conjugate-13 02/17/2012   Pneumococcal Polysaccharide-23 05/19/2016   Tdap 02/17/2012   Zoster Recombinant(Shingrix) 10/15/2021,  02/02/2022   Pertinent  Health Maintenance Due  Topic Date Due   OPHTHALMOLOGY EXAM  10/13/2022   FOOT EXAM  11/03/2022   HEMOGLOBIN A1C  07/11/2024   INFLUENZA VACCINE  Completed   DEXA SCAN  Completed      09/01/2022    2:33 PM 09/08/2022    2:18 PM 01/11/2023    9:19 AM 01/17/2023    8:56 AM 01/16/2024    9:02 AM  Fall Risk  Falls in the past year? 0 0 0 0 0  Was there an injury with Fall? 0 0 0 0 0  Fall Risk Category Calculator 0 0 0 0 0  Fall Risk Category (Retired) Low Low     (RETIRED) Patient Fall Risk Level Low fall risk Low fall risk     Patient at Risk for Falls Due to No Fall Risks No Fall Risks No Fall Risks No Fall Risks   Fall risk Follow up Falls evaluation completed Falls evaluation completed Falls evaluation completed Falls evaluation completed    Functional Status Survey:    Vitals:   01/16/24 0911  BP: 134/82  Resp: 17  Temp: 97.7 F (36.5 C)  SpO2: 96%  Weight: 163 lb 3.2 oz (74 kg)  Height: 5\' 1"  (1.549 m)   Body mass index is 30.84 kg/m. Physical Exam Vitals  reviewed.  Constitutional:      General: She is not in acute distress.    Appearance: Normal appearance. She is obese. She is not ill-appearing or diaphoretic.  HENT:     Head: Normocephalic.     Right Ear: Tympanic membrane, ear canal and external ear normal. There is no impacted cerumen.     Left Ear: Tympanic membrane, ear canal and external ear normal. There is no impacted cerumen.     Nose: Nose normal. No congestion or rhinorrhea.     Mouth/Throat:     Mouth: Mucous membranes are moist.     Pharynx: Oropharynx is clear. No oropharyngeal exudate or posterior oropharyngeal erythema.  Eyes:     General: No scleral icterus.       Right eye: No discharge.        Left eye: No discharge.     Extraocular Movements: Extraocular movements intact.     Conjunctiva/sclera: Conjunctivae normal.     Pupils: Pupils are equal, round, and reactive to light.  Neck:     Vascular: No carotid bruit.  Cardiovascular:     Rate and Rhythm: Normal rate and regular rhythm.     Pulses: Normal pulses.     Heart sounds: Normal heart sounds. No murmur heard.    No friction rub. No gallop.  Pulmonary:     Effort: Pulmonary effort is normal. No respiratory distress.     Breath sounds: Normal breath sounds. No wheezing, rhonchi or rales.  Chest:     Chest wall: No tenderness.  Abdominal:     General: Bowel sounds are normal. There is no distension.     Palpations: Abdomen is soft. There is no mass.     Tenderness: There is no abdominal tenderness. There is no right CVA tenderness, left CVA tenderness, guarding or rebound.  Musculoskeletal:        General: No swelling or tenderness. Normal range of motion.     Cervical back: Normal range of motion. No rigidity or tenderness.     Right lower leg: No edema.     Left lower leg: No edema.  Feet:  Right foot:     Skin integrity: Callus present. No ulcer, blister, skin breakdown, erythema, warmth or dry skin.     Left foot:     Skin integrity: Callus  present. No ulcer, blister, skin breakdown, erythema, warmth or dry skin.  Lymphadenopathy:     Cervical: No cervical adenopathy.  Skin:    General: Skin is warm and dry.     Coloration: Skin is not pale.     Findings: No bruising, erythema, lesion or rash.  Neurological:     Mental Status: She is alert and oriented to person, place, and time.     Cranial Nerves: No cranial nerve deficit.     Sensory: No sensory deficit.     Motor: No weakness.     Coordination: Coordination normal.     Gait: Gait normal.  Psychiatric:        Mood and Affect: Mood normal.        Speech: Speech normal.        Behavior: Behavior normal.        Thought Content: Thought content normal.        Judgment: Judgment normal.     Labs reviewed: Recent Labs    07/07/23 0859 01/12/24 0803  NA 138 141  K 4.4 4.6  CL 106 106  CO2 27 28  GLUCOSE 128* 123*  BUN 16 16  CREATININE 0.61 0.73  CALCIUM 9.5 9.5   Recent Labs    07/07/23 0859 01/12/24 0803  AST 14 12  ALT 14 11  BILITOT 0.5 0.5  PROT 7.3 7.5   Recent Labs    07/07/23 0859 01/12/24 0803  WBC 5.5 6.1  NEUTROABS 2,096 2,044  HGB 13.7 13.5  HCT 41.8 41.2  MCV 78.4* 77.6*  PLT 162 202   Lab Results  Component Value Date   TSH 2.28 01/12/2024   Lab Results  Component Value Date   HGBA1C 6.6 (H) 01/12/2024   Lab Results  Component Value Date   CHOL 187 01/12/2024   HDL 56 01/12/2024   LDLCALC 117 (H) 01/12/2024   TRIG 53 01/12/2024   CHOLHDL 3.3 01/12/2024    Significant Diagnostic Results in last 30 days:  No results found.  Assessment/Plan    Type 2 Diabetes Mellitus Hemoglobin A1c increased from 6.5% to 6.6%. Glucose level was 123 mg/dL. Reports variable glucose readings at home, with higher readings in the morning. Discussed impact of diet, particularly carbohydrate intake, on glucose levels. - Continue Onglyza (saxagliptin) 2.5 mg daily - Advise to reduce carbohydrate intake, particularly bread and pasta -  Encourage regular blood glucose monitoring - continue to follow up with ophthalmology for annual eye exam  - follow up with Podiatrist for annual foot exam  - Order follow-up lab work in six months - TSH; Future - COMPLETE METABOLIC PANEL WITH GFR; Future - CBC with Differential/Platelet; Future - Hemoglobin A1c; Future  Hyperlipidemia Total cholesterol increased from 162 mg/dL to 960 mg/dL. LDL cholesterol increased from 89 mg/dL to 454 mg/dL. Discussed dietary factors, particularly cheese consumption, contributing to elevated cholesterol levels. - Continue Zetia (ezetimibe) - Advise to reduce intake of fatty foods, particularly cheese - Order follow-up lab work in six months - Lipid panel; Future  Hypertension Blood pressure well-controlled at 134/82 mmHg. Currently on amlodipine and carvedilol. - Continue amlodipine 5 mg daily - Continue carvedilol 12.5 mg daily - Monitor blood pressure regularly  Osteoarthritis Reports arthritis pain in both hands, managed with ibuprofen. Wears a glove  at night for Carpal tunnel relief. - Continue using ibuprofen as needed for pain - Continue wearing glove at night for arthritis pain - continue to follow up with Orthopedic   Chronic Constipation Uses Senokot and milk of magnesia as needed. Discussed importance of hydration and dietary fiber. - Continue Senokot as needed - Continue milk of magnesia as needed - Encourage increased water intake and dietary fiber   Gastroesophageal reflux disease without esophagitis Symptoms stable change Protonix to as needed - pantoprazole (PROTONIX) 40 MG tablet; Take 1 tablet (40 mg total) by mouth daily as needed. - COMPLETE METABOLIC PANEL WITH GFR; Future - CBC with Differential/Platelet; Future  Callus of foot No ulceration noted  - continue to follow up with Podiatrist   General Health Maintenance Not currently taking multivitamins or probiotics. Discussed benefits of multivitamins and B12  supplementation. - Recommend taking a multivitamin daily - Recommend B12 supplementation (1000 mcg daily)  Follow-up - Order follow-up lab work in six months - Schedule follow-up appointment in six months - Call if any refills are needed.   Family/ staff Communication: Reviewed plan of care with patient verbalized understanding   Labs/tests ordered:  - TSH; Future - COMPLETE METABOLIC PANEL WITH GFR; Future - CBC with Differential/Platelet; Future - Hemoglobin A1c; Future - Lipid panel; Future  Next Appointment :Return in about 6 months (around 07/15/2024) for medical mangement of chronic issues., Fasting labs in 6 months prior to visit.    Caesar Bookman, NP

## 2024-01-16 NOTE — Patient Instructions (Signed)
magnesium citrate

## 2024-01-17 ENCOUNTER — Other Ambulatory Visit: Payer: Self-pay

## 2024-01-17 DIAGNOSIS — E785 Hyperlipidemia, unspecified: Secondary | ICD-10-CM

## 2024-01-17 DIAGNOSIS — I10 Essential (primary) hypertension: Secondary | ICD-10-CM

## 2024-01-17 MED ORDER — EZETIMIBE 10 MG PO TABS: 10.0000 mg | ORAL_TABLET | Freq: Every day | ORAL | 1 refills | Status: DC

## 2024-01-17 MED ORDER — AMLODIPINE BESYLATE 5 MG PO TABS: ORAL_TABLET | ORAL | 1 refills | Status: DC

## 2024-01-20 ENCOUNTER — Encounter: Payer: Medicare Other | Admitting: Family

## 2024-01-26 ENCOUNTER — Encounter: Payer: Self-pay | Admitting: Family

## 2024-01-26 ENCOUNTER — Ambulatory Visit: Payer: Medicare Other | Admitting: Family

## 2024-01-26 DIAGNOSIS — Z0000 Encounter for general adult medical examination without abnormal findings: Secondary | ICD-10-CM | POA: Diagnosis not present

## 2024-01-26 DIAGNOSIS — E1142 Type 2 diabetes mellitus with diabetic polyneuropathy: Secondary | ICD-10-CM

## 2024-01-26 NOTE — Progress Notes (Signed)
This service is provided via telemedicine  No vital signs collected/recorded due to the encounter was a telemedicine visit.   Location of patient (ex: home, work):  Home  Patient consents to a telephone visit: Yes  Location of the provider (ex: office, home):  [facility], Office   Name of any referring provider:  N/A  Names of all persons participating in the telemedicine service and their role in the encounter:  Ronald Pippins, CMA, Patient, and   Time spent on call:  9 min with medical assistant  Betina Puckett, Donalee Citrin, NP     Subjective:   Tanya Bell is a 83 y.o. female who presents for Medicare Annual (Subsequent) preventive examination.  Visit Complete: Virtual I connected with  Tanya Bell on 01/26/24 by a video and audio enabled telemedicine application and verified that I am speaking with the correct person using two identifiers.  Patient Location: Home  Provider Location: Office/Clinic  I discussed the limitations of evaluation and management by telemedicine. The patient expressed understanding and agreed to proceed.  Vital Signs: Because this visit was a virtual/telehealth visit, some criteria may be missing or patient reported. Any vitals not documented were not able to be obtained and vitals that have been documented are patient reported.  Patient Medicare AWV questionnaire was completed by the patient on 01/26/2024 ; I have confirmed that all information answered by patient is correct and no changes since this date.  Cardiac Risk Factors include: advanced age (>34men, >81 women);smoking/ tobacco exposure;obesity (BMI >30kg/m2);hypertension;diabetes mellitus     Objective:    There were no vitals filed for this visit. There is no height or weight on file to calculate BMI.     01/26/2024   10:59 AM 01/16/2024    9:03 AM 01/17/2023    8:59 AM 01/11/2023    9:19 AM 09/08/2022    2:18 PM 09/01/2022    2:33 PM 07/09/2022    10:25 AM  Advanced Directives  Does Patient Have a Medical Advance Directive? No No No No No No No  Would patient like information on creating a medical advance directive? No - Patient declined No - Patient declined No - Patient declined No - Patient declined No - Patient declined No - Patient declined No - Patient declined    Current Medications (verified) Outpatient Encounter Medications as of 01/26/2024  Medication Sig   acetaminophen (TYLENOL) 325 MG tablet Take 325 mg by mouth daily as needed. For pain   albuterol (VENTOLIN HFA) 108 (90 Base) MCG/ACT inhaler Inhale 2 puffs into the lungs every 6 (six) hours as needed for wheezing or shortness of breath.   amLODipine (NORVASC) 5 MG tablet TAKE 1 TABLET(5 MG) BY MOUTH DAILY   budesonide-formoterol (SYMBICORT) 160-4.5 MCG/ACT inhaler Inhale 2 puffs into the lungs 2 (two) times daily. PLEASE GIVE PATIENT 3 MONTH SUPPLY.   carvedilol (COREG) 12.5 MG tablet TAKE 1 TABLET ONCE DAILY FOR BLOOD PRESSURE   ezetimibe (ZETIA) 10 MG tablet Take 1 tablet (10 mg total) by mouth daily.   fexofenadine (ALLEGRA) 180 MG tablet Take 180 mg by mouth as needed.    ketorolac (ACULAR) 0.5 % ophthalmic solution Place 1 drop into the right eye 4 (four) times daily.   losartan (COZAAR) 100 MG tablet Take 1 tablet (100 mg total) by mouth daily.   Magnesium Gluconate 250 MG TABS Take 1 tablet (250 mg total) by mouth daily.   mometasone (NASONEX) 50 MCG/ACT nasal spray One  Spray each Nostril twice daily as needed for allergies.   pantoprazole (PROTONIX) 40 MG tablet Take 1 tablet (40 mg total) by mouth daily as needed.   phenylephrine (,USE FOR PREPARATION-H,) 0.25 % suppository Place 1 suppository rectally as needed for hemorrhoids.   prednisoLONE acetate (PRED FORTE) 1 % ophthalmic suspension Place 1 drop into the right eye 4 (four) times daily.    saxagliptin HCl (ONGLYZA) 2.5 MG TABS tablet Take 1 tablet (2.5 mg total) by mouth daily.   senna-docusate (SENOKOT-S)  8.6-50 MG tablet Take 1 tablet by mouth at bedtime.   simethicone (GAS-X) 80 MG chewable tablet Chew 1 tablet (80 mg total) by mouth every 6 (six) hours as needed for flatulence.   sodium chloride (OCEAN) 0.65 % SOLN nasal spray Place 1 spray into both nostrils as needed for congestion.   calcium-vitamin D (OSCAL WITH D) 500-200 MG-UNIT per tablet Take 1 tablet by mouth 2 (two) times daily. (Patient not taking: Reported on 01/26/2024)   cyanocobalamin (VITAMIN B12) 1000 MCG tablet Take 1 tablet (1,000 mcg total) by mouth daily. (Patient not taking: Reported on 01/26/2024)   ELDERBERRY PO Take by mouth 3 (three) times a week.  (Patient not taking: Reported on 01/26/2024)   gabapentin (NEURONTIN) 100 MG capsule Take 1 capsule (100 mg total) by mouth 2 (two) times daily as needed. For pain (Patient not taking: Reported on 01/26/2024)   Multiple Vitamins-Minerals (ZINC PO) Take 1 tablet by mouth once a week. (Patient not taking: Reported on 01/26/2024)   No facility-administered encounter medications on file as of 01/26/2024.    Allergies (verified) Crestor [rosuvastatin calcium], Latex, and Sulfa antibiotics   History: Past Medical History:  Diagnosis Date   Acute upper respiratory infections of unspecified site    Acute upper respiratory infections of unspecified site    Acute upper respiratory infections of unspecified site    Allergic rhinitis due to pollen    Candidiasis of mouth    Disorder of bone and cartilage, unspecified    Diverticulitis of colon (without mention of hemorrhage)(562.11)    Essential hypertension, benign    Essential hypertension, benign    Extrinsic asthma, unspecified    Herpes simplex with other ophthalmic complications    Other abnormal glucose    Other and unspecified hyperlipidemia    Other and unspecified hyperlipidemia    Other and unspecified hyperlipidemia    Other cataract    Other cataract    Other dystrophy of vulva    Other malaise and fatigue     Rash and other nonspecific skin eruption    Reflux esophagitis    Thrombocytopenia, unspecified (HCC)    Type II or unspecified type diabetes mellitus without mention of complication, not stated as uncontrolled    Type II or unspecified type diabetes mellitus without mention of complication, not stated as uncontrolled    Unspecified disorder of skin and subcutaneous tissue    Unspecified essential hypertension    Unspecified essential hypertension    Past Surgical History:  Procedure Laterality Date   ABDOMINAL HYSTERECTOMY     CESAREAN SECTION     [phone]   CYST EXCISION  1974   Pilonidal Cyst excision   EYE SURGERY Right 04/14/2017   Dr. Luciana Axe   KNEE ARTHROSCOPY Right    for meniscal tear   RETINAL TEAR REPAIR CRYOTHERAPY  10/21/2017   Getting injections in right eye   Family History  Problem Relation Age of Onset   Cancer Mother  brain   Stroke Father    Social History   Socioeconomic History   Marital status: Married    Spouse name: Not on file   Number of children: Not on file   Years of education: Not on file   Highest education level: Not on file  Occupational History   Not on file  Tobacco Use   Smoking status: Former    Current packs/day: 0.00    Types: Cigarettes    Quit date: 12/13/1990    Years since quitting: 33.1   Smokeless tobacco: Never  Vaping Use   Vaping status: Never Used  Substance and Sexual Activity   Alcohol use: No    Alcohol/week: 0.0 standard drinks of alcohol   Drug use: No   Sexual activity: Yes    Partners: Male    Comment: None   Other Topics Concern   Not on file  Social History Narrative   Not on file   Social Drivers of Health   Financial Resource Strain: Low Risk  (12/14/2017)   Overall Financial Resource Strain (CARDIA)    Difficulty of Paying Living Expenses: Not hard at all  Food Insecurity: No Food Insecurity (12/14/2017)   Hunger Vital Sign    Worried About Running Out of Food in the Last Year:  Never true    Ran Out of Food in the Last Year: Never true  Transportation Needs: Unmet Transportation Needs (12/14/2017)   PRAPARE - Transportation    Lack of Transportation (Medical): Yes    Lack of Transportation (Non-Medical): Yes  Physical Activity: Inactive (12/14/2017)   Exercise Vital Sign    Days of Exercise per Week: 0 days    Minutes of Exercise per Session: 0 min  Stress: Stress Concern Present (12/14/2017)   [employer] of Occupational Health - Occupational Stress Questionnaire    Feeling of Stress : Very much  Social Connections: Socially Integrated (12/14/2017)   Social Connection and Isolation Panel [NHANES]    Frequency of Communication with Friends and Family: More than three times a week    Frequency of Social Gatherings with Friends and Family: Once a week    Attends Religious Services: More than 4 times per year    Active Member of [employer] or Organizations: Yes    Attends Banker Meetings: Never    Marital Status: Married    Tobacco Counseling Counseling given: Not Answered   Clinical Intake:  Pre-visit preparation completed: No  Pain : No/denies pain     BMI - recorded: 30.8 Nutritional Status: BMI > 30  Obese Nutritional Risks: None Diabetes: Yes CBG done?: Yes (104 at home) CBG resulted in Enter/ Edit results?: No Did pt. bring in CBG monitor from home?: Yes (reports 90's -100's) Glucose Meter Downloaded?: No  How often do you need to have someone help you when you read instructions, pamphlets, or other written materials from your doctor or pharmacy?: 1 - Never What is the last grade level you completed in school?: GED  Interpreter Needed?: No      Activities of Daily Living    01/26/2024   11:36 AM  In your present state of health, do you have any difficulty performing the following activities:  Hearing? 0  Vision? 1  Comment follow up with [facility] center in [facility]  Difficulty concentrating or making decisions? 0  Walking or  climbing stairs? 0  Dressing or bathing? 0  Doing errands, shopping? 0  Preparing Food and eating ? N  Using the  Toilet? N  In the past six months, have you accidently leaked urine? Y  Do you have problems with loss of bowel control? N  Managing your Medications? N  Managing your Finances? N  Housekeeping or managing your Housekeeping? N    Patient Care Team: Vedanshi Massaro, Donalee Citrin, NP as PCP - General (Family Medicine) Dorena Cookey, MD (Inactive) as Consulting Physician (Gastroenterology) Sallye Lat, MD as Consulting Physician (Ophthalmology) Luciana Axe Alford Highland, MD as Consulting Physician (Ophthalmology) Aggie Cosier, MD as Referring Physician (Psychiatry) Ria Clock (Inactive) (Orthotics)  Indicate any recent Medical Services you may have received from other than [facility] providers in the past year (date may be approximate).     Assessment:   This is a routine wellness examination for Kymora.  Hearing/Vision screen Hearing Screening - Comments:: No hearing issues. Vision Screening - Comments:: Eye appointment in march.   Goals Addressed             This Visit's Progress    < 200 lbs       Loss weight      Exercise 3x per week (30 min per time)   Not on track    I would like to increase my exercise twice per week  I will also like to socialize more        Depression Screen    01/26/2024   10:58 AM 01/16/2024    9:02 AM 01/17/2023    8:56 AM 01/14/2022    9:28 AM 01/12/2021   10:20 AM 02/21/2020   10:13 AM 01/04/2020   10:18 AM  PHQ 2/9 Scores  PHQ - 2 Score 0 0 0 0 0 0 0    Fall Risk    01/26/2024   10:58 AM 01/16/2024    9:02 AM 01/17/2023    8:56 AM 01/11/2023    9:19 AM 09/08/2022    2:18 PM  Fall Risk   Falls in the past year? 0 0 0 0 0  Number falls in past yr: 0 0 0 0 0  Injury with Fall? 0 0 0 0 0  Risk for fall due to :   No Fall Risks No Fall Risks No Fall Risks  Follow up   Falls evaluation completed Falls evaluation completed Falls evaluation  completed    MEDICARE RISK AT HOME: Medicare Risk at Home Any stairs in or around the home?: No If so, are there any without handrails?: No Home free of loose throw rugs in walkways, pet beds, electrical cords, etc?: No Adequate lighting in your home to reduce risk of falls?: Yes Life alert?: No Use of a cane, walker or w/c?: No Grab bars in the bathroom?: Yes Shower chair or bench in shower?: No Elevated toilet seat or a handicapped toilet?: No  TIMED UP AND GO:  Was the test performed?  No    Cognitive Function:    01/04/2020   10:20 AM 01/02/2019   11:14 AM 12/14/2017    9:25 AM 10/29/2016    9:26 AM 09/04/2014    8:44 AM  MMSE - Mini Mental State Exam  Orientation to time 4 5 5 5 5   Orientation to Place 5 5 5 5 5   Registration 3 3 3 3 3   Attention/ Calculation 5 5 5 5 3   Recall 3 3 1 2 1   Language- name 2 objects 2 2 2 2 2   Language- repeat 1 1 1 1 1   Language- follow 3 step command 3 3 3 2  2  Language- read & follow direction 1 1 1 1 1   Write a sentence 1 1 1 1 1   Copy design 1 1 1 1 1   Total score 29 30 28 28 25         01/26/2024   11:04 AM 01/17/2023    8:57 AM 01/14/2022    9:29 AM 01/12/2021   10:21 AM  6CIT Screen  What Year? 0 points 0 points 0 points 0 points  What month? 0 points 0 points 0 points 0 points  What time? 0 points 0 points 0 points 0 points  Count back from 20 0 points 0 points 0 points 0 points  Months in reverse 4 points 0 points 0 points 0 points  Repeat phrase 0 points 6 points 0 points 6 points  Total Score 4 points 6 points 0 points 6 points    Immunizations Immunization History  Administered Date(s) Administered   Fluad Quad(high Dose 65+) 08/29/2019, 09/02/2020, 09/28/2021, 08/23/2022   Fluad Trivalent(High Dose 65+) 10/10/2023   Influenza Split 08/25/2010, 08/26/2011, 10/04/2012   Influenza Whole 09/24/2009   Influenza, High Dose Seasonal PF 08/16/2018   Influenza,inj,Quad PF,6+ Mos 09/11/2013, 09/04/2014, 11/12/2015,  10/04/2016, 09/06/2017   Influenza-Unspecified 09/02/2018   Moderna Covid-19 Fall Seasonal Vaccine 38yrs & older 08/19/2023   PFIZER(Purple Top)SARS-COV-2 Vaccination 02/11/2020, 03/03/2020, 09/17/2020, 07/04/2021, 10/09/2021   Pneumococcal Conjugate-13 02/17/2012   Pneumococcal Polysaccharide-23 05/19/2016   Tdap 02/17/2012   Zoster Recombinant(Shingrix) 10/15/2021, 02/02/2022    TDAP status: Due, Education has been provided regarding the importance of this vaccine. Advised may receive this vaccine at local pharmacy or Health Dept. Aware to provide a copy of the vaccination record if obtained from local pharmacy or Health Dept. Verbalized acceptance and understanding.  Flu Vaccine status: Up to date  Pneumococcal vaccine status: Up to date  Covid-19 vaccine status: Completed vaccines  Qualifies for Shingles Vaccine? Yes   Zostavax completed No   Shingrix Completed?: Yes  Screening Tests Health Maintenance  Topic Date Due   DTaP/Tdap/Td (2 - Td or Tdap) 02/16/2022   OPHTHALMOLOGY EXAM  10/13/2022   Diabetic kidney evaluation - Urine ACR  01/22/2024   HEMOGLOBIN A1C  07/11/2024   Diabetic kidney evaluation - eGFR measurement  01/11/2025   FOOT EXAM  01/15/2025   Medicare Annual Wellness (AWV)  01/25/2025   Pneumonia Vaccine 79+ Years old  Completed   INFLUENZA VACCINE  Completed   DEXA SCAN  Completed   COVID-19 Vaccine  Completed   Zoster Vaccines- Shingrix  Completed   HPV VACCINES  Aged Out   Hepatitis C Screening  Discontinued    Health Maintenance  Health Maintenance Due  Topic Date Due   DTaP/Tdap/Td (2 - Td or Tdap) 02/16/2022   OPHTHALMOLOGY EXAM  10/13/2022   Diabetic kidney evaluation - Urine ACR  01/22/2024    Colorectal cancer screening: No longer required.   Mammogram status: No longer required due to advance age .  Bone Density status: Completed 09/18/2019. Results reflect: Bone density results: OSTEOPENIA. Repeat every 5 years.  Lung Cancer  Screening: (Low Dose CT Chest recommended if Age 82-80 years, 20 pack-year currently smoking OR have quit w/in 15years.) does not qualify.   Lung Cancer Screening Referral: No   Additional Screening:  Hepatitis C Screening: does not qualify; Completed yes   Vision Screening: Recommended annual ophthalmology exams for early detection of glaucoma and other disorders of the eye. Is the patient up to date with their annual eye exam?  No  Who is the provider or what is the name of the office in which the patient attends annual eye exams? Has up coming appointment with eye Center in [place]  If pt is not established with a provider, would they like to be referred to a provider to establish care? No .   Dental Screening: Recommended annual dental exams for proper oral hygiene  Diabetic Foot Exam: Diabetic Foot Exam: Completed 01/16/2024  Community Resource Referral / Chronic Care Management: CRR required this visit?  No   CCM required this visit?  No     Plan:     I have personally reviewed and noted the following in the patient's chart:   Medical and social history Use of alcohol, tobacco or illicit drugs  Current medications and supplements including opioid prescriptions. Patient is not currently taking opioid prescriptions. Functional ability and status Nutritional status Physical activity Advanced directives List of other physicians Hospitalizations, surgeries, and ER visits in previous 12 months Vitals Screenings to include cognitive, depression, and falls Referrals and appointments  In addition, I have reviewed and discussed with patient certain preventive protocols, quality metrics, and best practice recommendations. A written personalized care plan for preventive services as well as general preventive health recommendations were provided to patient.     Caesar Bookman, NP   01/26/2024   After Visit Summary: (MyChart) Due to this being a telephonic visit, the after visit  summary with patients personalized plan was offered to patient via MyChart   Nurse Notes: Due for Tdap vaccine advised to get vaccine at the pharmacy. - due for annual eye exam.Has upcoming appointment in march,2025 - urine microalbumin with next lab work

## 2024-01-26 NOTE — Patient Instructions (Signed)
[name] , Thank you for taking time to come for your Medicare Wellness Visit. I appreciate your ongoing commitment to your health goals. Please review the following plan we discussed and let me know if I can assist you in the future.   Screening recommendations/referrals: Colonoscopy : N/a  Mammogram N/A  Bone Density : Up to date  Recommended yearly ophthalmology/optometry visit for glaucoma screening and checkup Recommended yearly dental visit for hygiene and checkup  Vaccinations: Influenza vaccine- due annually in September/October Pneumococcal vaccine : Up to date  Tdap vaccine : Please get Tdap vaccine at the pharmacy  Shingles vaccine : Up to date   Advanced directives: No   Conditions/risks identified: advanced age (>13men, >73 women);smoking/ tobacco exposure;obesity (BMI >30kg/m2);hypertension;diabetes mellitus  Next appointment: 1 year    Preventive Care 41 Years and Older, Female Preventive care refers to lifestyle choices and visits with your health care provider that can promote health and wellness. What does preventive care include? A yearly physical exam. This is also called an annual well check. Dental exams once or twice a year. Routine eye exams. Ask your health care provider how often you should have your eyes checked. Personal lifestyle choices, including: Daily care of your teeth and gums. Regular physical activity. Eating a healthy diet. Avoiding tobacco and drug use. Limiting alcohol use. Practicing safe sex. Taking low-dose aspirin every day. Taking vitamin and mineral supplements as recommended by your health care provider. What happens during an annual well check? The services and screenings done by your health care provider during your annual well check will depend on your age, overall health, lifestyle risk factors, and family history of disease. Counseling  Your health care provider may ask you questions about your: Alcohol use. Tobacco  use. Drug use. Emotional well-being. Home and relationship well-being. Sexual activity. Eating habits. History of falls. Memory and ability to understand (cognition). Work and work Astronomer. Reproductive health. Screening  You may have the following tests or measurements: Height, weight, and BMI. Blood pressure. Lipid and cholesterol levels. These may be checked every 5 years, or more frequently if you are over 49 years old. Skin check. Lung cancer screening. You may have this screening every year starting at age 26 if you have a 30-pack-year history of smoking and currently smoke or have quit within the past 15 years. Fecal occult blood test (FOBT) of the stool. You may have this test every year starting at age 26. Flexible sigmoidoscopy or colonoscopy. You may have a sigmoidoscopy every 5 years or a colonoscopy every 10 years starting at age 20. Hepatitis C blood test. Hepatitis B blood test. Sexually transmitted disease (STD) testing. Diabetes screening. This is done by checking your blood sugar (glucose) after you have not eaten for a while (fasting). You may have this done every 1-3 years. Bone density scan. This is done to screen for osteoporosis. You may have this done starting at age 16. Mammogram. This may be done every 1-2 years. Talk to your health care provider about how often you should have regular mammograms. Talk with your health care provider about your test results, treatment options, and if necessary, the need for more tests. Vaccines  Your health care provider may recommend certain vaccines, such as: Influenza vaccine. This is recommended every year. Tetanus, diphtheria, and acellular pertussis (Tdap, Td) vaccine. You may need a Td booster every 10 years. Zoster vaccine. You may need this after age 12. Pneumococcal 13-valent conjugate (PCV13) vaccine. One dose is recommended after  age 38. Pneumococcal polysaccharide (PPSV23) vaccine. One dose is recommended  after age 75. Talk to your health care provider about which screenings and vaccines you need and how often you need them. This information is not intended to replace advice given to you by your health care provider. Make sure you discuss any questions you have with your health care provider. Document Released: 12/26/2015 Document Revised: 08/18/2016 Document Reviewed: 09/30/2015 Elsevier Interactive Patient Education  2017 [employer].  Fall Prevention in the Home Falls can cause injuries. They can happen to people of all ages. There are many things you can do to make your home safe and to help prevent falls. What can I do on the outside of my home? Regularly fix the edges of walkways and driveways and fix any cracks. Remove anything that might make you trip as you walk through a door, such as a raised step or threshold. Trim any bushes or trees on the path to your home. Use bright outdoor lighting. Clear any walking paths of anything that might make someone trip, such as rocks or tools. Regularly check to see if handrails are loose or broken. Make sure that both sides of any steps have handrails. Any raised decks and porches should have guardrails on the edges. Have any leaves, snow, or ice cleared regularly. Use sand or salt on walking paths during winter. Clean up any spills in your garage right away. This includes oil or grease spills. What can I do in the bathroom? Use night lights. Install grab bars by the toilet and in the tub and shower. Do not use towel bars as grab bars. Use non-skid mats or decals in the tub or shower. If you need to sit down in the shower, use a plastic, non-slip stool. Keep the floor dry. Clean up any water that spills on the floor as soon as it happens. Remove soap buildup in the tub or shower regularly. Attach bath mats securely with double-sided non-slip rug tape. Do not have throw rugs and other things on the floor that can make you trip. What can I do  in the bedroom? Use night lights. Make sure that you have a light by your bed that is easy to reach. Do not use any sheets or blankets that are too big for your bed. They should not hang down onto the floor. Have a firm chair that has side arms. You can use this for support while you get dressed. Do not have throw rugs and other things on the floor that can make you trip. What can I do in the kitchen? Clean up any spills right away. Avoid walking on wet floors. Keep items that you use a lot in easy-to-reach places. If you need to reach something above you, use a strong step stool that has a grab bar. Keep electrical cords out of the way. Do not use floor polish or wax that makes floors slippery. If you must use wax, use non-skid floor wax. Do not have throw rugs and other things on the floor that can make you trip. What can I do with my stairs? Do not leave any items on the stairs. Make sure that there are handrails on both sides of the stairs and use them. Fix handrails that are broken or loose. Make sure that handrails are as long as the stairways. Check any carpeting to make sure that it is firmly attached to the stairs. Fix any carpet that is loose or worn. Avoid having throw rugs  at the top or bottom of the stairs. If you do have throw rugs, attach them to the floor with carpet tape. Make sure that you have a light switch at the top of the stairs and the bottom of the stairs. If you do not have them, ask someone to add them for you. What else can I do to help prevent falls? Wear shoes that: Do not have high heels. Have rubber bottoms. Are comfortable and fit you well. Are closed at the toe. Do not wear sandals. If you use a stepladder: Make sure that it is fully opened. Do not climb a closed stepladder. Make sure that both sides of the stepladder are locked into place. Ask someone to hold it for you, if possible. Clearly mark and make sure that you can see: Any grab bars or  handrails. First and last steps. Where the edge of each step is. Use tools that help you move around (mobility aids) if they are needed. These include: Canes. Walkers. Scooters. Crutches. Turn on the lights when you go into a dark area. Replace any light bulbs as soon as they burn out. Set up your furniture so you have a clear path. Avoid moving your furniture around. If any of your floors are uneven, fix them. If there are any pets around you, be aware of where they are. Review your medicines with your doctor. Some medicines can make you feel dizzy. This can increase your chance of falling. Ask your doctor what other things that you can do to help prevent falls. This information is not intended to replace advice given to you by your health care provider. Make sure you discuss any questions you have with your health care provider. Document Released: 09/25/2009 Document Revised: 05/06/2016 Document Reviewed: 01/03/2015 Elsevier Interactive Patient Education  2017 [employer].

## 2024-02-08 ENCOUNTER — Telehealth: Payer: Self-pay | Admitting: Podiatry

## 2024-02-08 ENCOUNTER — Ambulatory Visit (INDEPENDENT_AMBULATORY_CARE_PROVIDER_SITE_OTHER): Payer: Medicare Other | Admitting: Podiatry

## 2024-02-08 ENCOUNTER — Encounter: Payer: Self-pay | Admitting: Podiatry

## 2024-02-08 DIAGNOSIS — M79674 Pain in right toe(s): Secondary | ICD-10-CM

## 2024-02-08 DIAGNOSIS — B351 Tinea unguium: Secondary | ICD-10-CM

## 2024-02-08 DIAGNOSIS — L84 Corns and callosities: Secondary | ICD-10-CM

## 2024-02-08 DIAGNOSIS — M79675 Pain in left toe(s): Secondary | ICD-10-CM

## 2024-02-08 DIAGNOSIS — E119 Type 2 diabetes mellitus without complications: Secondary | ICD-10-CM

## 2024-02-08 NOTE — Progress Notes (Signed)
 Subjective:  Patient ID: Tanya Bell, female    DOB: 1941-04-10,   MRN: [id number]  No chief complaint on file.   83 y.o. female presents for concern of thickened elongated and painful nails that are difficult to trim. Requesting to have them trimmed today. Relates burning and tingling in their feet. Patient is diabetic and last A1c was  Lab Results  Component Value Date   HGBA1C 6.6 (H) 01/12/2024   .   PCP:  Ngetich, Donalee Citrin, NP    . Denies any other pedal complaints. Denies n/v/f/c.   Past Medical History:  Diagnosis Date   Acute upper respiratory infections of unspecified site    Acute upper respiratory infections of unspecified site    Acute upper respiratory infections of unspecified site    Allergic rhinitis due to pollen    Candidiasis of mouth    Disorder of bone and cartilage, unspecified    Diverticulitis of colon (without mention of hemorrhage)(562.11)    Essential hypertension, benign    Essential hypertension, benign    Extrinsic asthma, unspecified    Herpes simplex with other ophthalmic complications    Other abnormal glucose    Other and unspecified hyperlipidemia    Other and unspecified hyperlipidemia    Other and unspecified hyperlipidemia    Other cataract    Other cataract    Other dystrophy of vulva    Other malaise and fatigue    Rash and other nonspecific skin eruption    Reflux esophagitis    Thrombocytopenia, unspecified (HCC)    Type II or unspecified type diabetes mellitus without mention of complication, not stated as uncontrolled    Type II or unspecified type diabetes mellitus without mention of complication, not stated as uncontrolled    Unspecified disorder of skin and subcutaneous tissue    Unspecified essential hypertension    Unspecified essential hypertension     Objective:  Physical Exam: Vascular: DP/PT pulses 2/4 bilateral. CFT <3 seconds. Absent hair growth on digits. Edema noted to bilateral lower extremities. Xerosis  noted bilaterally.  Skin. No lacerations or abrasions bilateral feet. Nails 1-5 bilateral  are thickened discolored and elongated with subungual debris. Hyperkeratotic lesion noted sub fifth metatarsal bilateral and sub first metatarsal on right  Musculoskeletal: MMT 5/5 bilateral lower extremities in DF, PF, Inversion and Eversion. Deceased ROM in DF of ankle joint. Bilateral HAV deformitty and hammered digits 2-5 bilateral.  Neurological: Sensation intact to light touch. Protective sensation diminished bilateral. ]   Assessment:   1. Pain due to onychomycosis of toenails of both feet   2. Controlled type 2 diabetes mellitus without complication, without long-term current use of insulin (HCC)      Plan:  Patient was evaluated and treated and all questions answered. -Discussed and educated patient on diabetic foot care, especially with  regards to the vascular, neurological and musculoskeletal systems.  -Stressed the importance of good glycemic control and the detriment of not  controlling glucose levels in relation to the foot. -Discussed supportive shoes at all times and checking feet regularly.  -Mechanically debrided all nails 1-5 bilateral using sterile nail nipper and filed with dremel without incident  -Hyperkeratotic area debrided without incident with chisel x 3. Bilateral fifth metatarsal head and sub first metatarsal head on right -Answered all patient questions -Patient to return  in 3 months for at risk foot care -Patient advised to call the office if any problems or questions arise in the meantime.   Louann Sjogren, DPM

## 2024-02-08 NOTE — Telephone Encounter (Signed)
 LVM to reschedule appt due to high pt count.

## 2024-02-11 ENCOUNTER — Other Ambulatory Visit: Payer: Self-pay | Admitting: Family

## 2024-02-13 ENCOUNTER — Other Ambulatory Visit: Payer: Self-pay | Admitting: *Deleted

## 2024-02-13 MED ORDER — LOSARTAN POTASSIUM 100 MG PO TABS: 100.0000 mg | ORAL_TABLET | Freq: Every day | ORAL | 0 refills | Status: DC

## 2024-02-13 MED ORDER — LOSARTAN POTASSIUM 100 MG PO TABS: 100.0000 mg | ORAL_TABLET | Freq: Every day | ORAL | 1 refills | Status: DC

## 2024-02-13 NOTE — Telephone Encounter (Signed)
 Patient called and stated that she is waiting for [facility] to send her Losartan. She has ran out of it and needs a Rx sent to a local pharmacy.  Patient stated that [facility] needs a new Rx also.

## 2024-02-16 DIAGNOSIS — Z8669 Personal history of other diseases of the nervous system and sense organs: Secondary | ICD-10-CM | POA: Diagnosis not present

## 2024-02-16 DIAGNOSIS — H3521 Other non-diabetic proliferative retinopathy, right eye: Secondary | ICD-10-CM | POA: Diagnosis not present

## 2024-02-16 DIAGNOSIS — H35371 Puckering of macula, right eye: Secondary | ICD-10-CM | POA: Diagnosis not present

## 2024-02-16 DIAGNOSIS — H35351 Cystoid macular degeneration, right eye: Secondary | ICD-10-CM | POA: Diagnosis not present

## 2024-02-16 DIAGNOSIS — E119 Type 2 diabetes mellitus without complications: Secondary | ICD-10-CM | POA: Diagnosis not present

## 2024-02-16 DIAGNOSIS — D573 Sickle-cell trait: Secondary | ICD-10-CM | POA: Diagnosis not present

## 2024-02-16 DIAGNOSIS — Z9889 Other specified postprocedural states: Secondary | ICD-10-CM | POA: Diagnosis not present

## 2024-02-27 ENCOUNTER — Other Ambulatory Visit: Payer: Self-pay | Admitting: Family

## 2024-02-27 MED ORDER — MOMETASONE FUROATE 50 MCG/ACT NA SUSP: NASAL | 1 refills | Status: DC

## 2024-02-27 NOTE — Telephone Encounter (Signed)
 Copied from CRM [id number]. Topic: Clinical - Medication Refill >> Feb 27, 2024  1:25 PM Hector Shade B wrote: Most Recent Primary Care Visit:  Provider: Richarda Blade C  Department: [facility]  Visit Type: MEDICARE AWV, SEQUENTIAL  Date: 01/26/2024  Medication: mometasone (NASONEX) 50 MCG/ACT nasal spray  Has the patient contacted their pharmacy? No (Agent: If no, request that the patient contact the pharmacy for the refill. If patient does not wish to contact the pharmacy document the reason why and proceed with request.) (Agent: If yes, when and what did the pharmacy advise?)  Is this the correct pharmacy for this prescription? Yes If no, delete pharmacy and type the correct one.  This is the patient's preferred pharmacy:  [facility] - [place], [place] - [address] [place] [address] Phone: [phone] Fax: [phone]  [facility] [phone] - [place], [place] - [address] AT [facility] [address] [facility] [address] [place] [address] Phone: [phone] Fax: [phone]   Has the prescription been filled recently? No  Is the patient out of the medication? Yes  Has the patient been seen for an appointment in the last year OR does the patient have an upcoming appointment? Yes  Can we respond through MyChart? Yes  Agent: Please be advised that Rx refills may take up to 3 business days. We ask that you follow-up with your pharmacy.

## 2024-02-28 ENCOUNTER — Telehealth: Payer: Self-pay

## 2024-02-28 DIAGNOSIS — R0981 Nasal congestion: Secondary | ICD-10-CM

## 2024-02-28 NOTE — Telephone Encounter (Signed)
 Pharmacy called this afternoon in regards to medication that was refill for Mometasone and stated this medication will not be covered by insurance and wanted to asked to the alternated medication Fluticasone nasal spray 16 grams Rx 50 MCG to [facility] that will covered by insurance. The number to call if you have any questions it is [phone] reference number [id number].  Please Advise  Message sent to Ngetich, Donalee Citrin, NP

## 2024-02-29 MED ORDER — FLUTICASONE PROPIONATE 50 MCG/ACT NA SUSP: 2.0000 | Freq: Every day | NASAL | 6 refills | Status: DC

## 2024-02-29 NOTE — Telephone Encounter (Signed)
 Pharmacy form with request for Fluticasone was filled and signed yesterday to be faxed to pharmacy. Fluticasone 50 mcg nasal spray instil 2 spray into each nostril daily.

## 2024-03-01 NOTE — Telephone Encounter (Signed)
 Spoke with patient this morning and she aware of the medication change from Mometasone to Fluticasone.  Patient wanted to asked if is okay to Allegra in the morning and Zyrtec at night along with the fluticasone because the patient said the allegra is not helping but she is the Zyrtec that is helping at night.  Please Advise.  Message sent to Ngetich, Donalee Citrin, NP

## 2024-03-01 NOTE — Telephone Encounter (Signed)
 Patient agree and she will Zyrtec. Message sent to Ngetich, Donalee Citrin, NP

## 2024-03-01 NOTE — Telephone Encounter (Signed)
 Recommend either using Allergra or Zrytec instead of both.continue on Flonase.

## 2024-03-01 NOTE — Telephone Encounter (Signed)
 noted

## 2024-03-22 ENCOUNTER — Other Ambulatory Visit: Payer: Self-pay | Admitting: Family

## 2024-03-23 ENCOUNTER — Encounter: Payer: Self-pay | Admitting: Family

## 2024-03-23 ENCOUNTER — Ambulatory Visit: Admitting: Family

## 2024-03-23 VITALS — BP 136/80 | HR 67 | Temp 97.8°F | Resp 19 | Ht 61.0 in | Wt 164.0 lb

## 2024-03-23 DIAGNOSIS — K5901 Slow transit constipation: Secondary | ICD-10-CM

## 2024-03-23 DIAGNOSIS — J0101 Acute recurrent maxillary sinusitis: Secondary | ICD-10-CM | POA: Diagnosis not present

## 2024-03-23 MED ORDER — AZITHROMYCIN 250 MG PO TABS: ORAL_TABLET | ORAL | 0 refills | Status: DC

## 2024-03-23 MED ORDER — FLUTICASONE PROPIONATE 50 MCG/ACT NA SUSP: 2.0000 | Freq: Every day | NASAL | 6 refills | Status: DC

## 2024-03-23 NOTE — Progress Notes (Signed)
 Provider: Richarda Blade FNP-C  Opal Mckellips, Donalee Citrin, NP  Patient Care Team: Stephaun Million, Donalee Citrin, NP as PCP - General (Family Medicine) Dorena Cookey, MD (Inactive) as Consulting Physician (Gastroenterology) Sallye Lat, MD as Consulting Physician (Ophthalmology) Luciana Axe Alford Highland, MD as Consulting Physician (Ophthalmology) Aggie Cosier, MD as Referring Physician (Psychiatry) Ria Clock (Inactive) (Orthotics)  Extended Emergency Contact Information Primary Emergency Contact: JAMES,THOMAS Home Phone: [phone] Relation: None  Code Status:  Full Code  Goals of care: Advanced Directive information    03/23/2024    1:26 PM  Advanced Directives  Does Patient Have a Medical Advance Directive? No  Would patient like information on creating a medical advance directive? No - Patient declined     Chief Complaint  Patient presents with   Acute Visit    Sinus infection.    Discussed the use of AI scribe software for clinical note transcription with the patient, who gave verbal consent to proceed.  History of Present Illness   Tanya Bell is an 83 year old female who presents with symptoms suggestive of a sinus infection.  She experiences significant nasal congestion, rhinorrhea, and post-nasal drip affecting her eyes, ears, and throat. Her eyes tear excessively, and her nose is persistently runny. No fever or chills are present. She notes yellow and occasionally blood-tinged mucus, likely from vigorous nose blowing. Tenderness is present in the frontal and maxillary sinuses, and mucus sometimes causes coughing, especially at night and in the morning.  She has been using Xyzal for allergies, which provides partial relief but does not alleviate sinus symptoms. Previous trials of Zyrtec and Claritin were ineffective, with Claritin causing drowsiness. A nasal spray caused burning and sneezing, and Symbicort is no longer covered by her insurance. She has not used a neti pot  recently.  Her current medication regimen includes Centrum and occasional use of senokot for constipation. She has not reordered elderberry or vitamin B12 supplements and has not taken Protonix recently but keeps it available for future use. She is not currently using gabapentin.  She is allergic to sulfa drugs, latex, and rosuvastatin (Crestor). She recalls using a Z-Pak in the past for similar symptoms but is unsure of its effectiveness.    Past Medical History:  Diagnosis Date   Acute upper respiratory infections of unspecified site    Acute upper respiratory infections of unspecified site    Acute upper respiratory infections of unspecified site    Allergic rhinitis due to pollen    Candidiasis of mouth    Disorder of bone and cartilage, unspecified    Diverticulitis of colon (without mention of hemorrhage)(562.11)    Essential hypertension, benign    Essential hypertension, benign    Extrinsic asthma, unspecified    Herpes simplex with other ophthalmic complications    Other abnormal glucose    Other and unspecified hyperlipidemia    Other and unspecified hyperlipidemia    Other and unspecified hyperlipidemia    Other cataract    Other cataract    Other dystrophy of vulva    Other malaise and fatigue    Rash and other nonspecific skin eruption    Reflux esophagitis    Thrombocytopenia, unspecified (HCC)    Type II or unspecified type diabetes mellitus without mention of complication, not stated as uncontrolled    Type II or unspecified type diabetes mellitus without mention of complication, not stated as uncontrolled    Unspecified disorder of skin and subcutaneous tissue    Unspecified essential hypertension  Unspecified essential hypertension    Past Surgical History:  Procedure Laterality Date   ABDOMINAL HYSTERECTOMY     CESAREAN SECTION     [phone]   CYST EXCISION  1974   Pilonidal Cyst excision   EYE SURGERY Right 04/14/2017   Dr. Luciana Axe   KNEE  ARTHROSCOPY Right    for meniscal tear   RETINAL TEAR REPAIR CRYOTHERAPY  10/21/2017   Getting injections in right eye    Allergies  Allergen Reactions   Crestor [Rosuvastatin Calcium]     Muscle cramps   Latex Other (See Comments)    GLOVES   Sulfa Antibiotics     Outpatient Encounter Medications as of 03/23/2024  Medication Sig   acetaminophen (TYLENOL) 325 MG tablet Take 325 mg by mouth daily as needed. For pain   albuterol (VENTOLIN HFA) 108 (90 Base) MCG/ACT inhaler Inhale 2 puffs into the lungs every 6 (six) hours as needed for wheezing or shortness of breath.   amLODipine (NORVASC) 5 MG tablet TAKE 1 TABLET(5 MG) BY MOUTH DAILY   azithromycin (ZITHROMAX) 250 MG tablet Take 2 tablets (500 mg ) by mouth x 1 dose then one tablet ( 250 mg ) by mouth daily x 4 days   calcium-vitamin D (OSCAL WITH D) 500-200 MG-UNIT per tablet Take 1 tablet by mouth 2 (two) times daily.   carvedilol (COREG) 12.5 MG tablet TAKE 1 TABLET ONCE DAILY FOR BLOOD PRESSURE   cyanocobalamin (VITAMIN B12) 1000 MCG tablet Take 1 tablet (1,000 mcg total) by mouth daily.   ELDERBERRY PO Take by mouth 3 (three) times a week.   ezetimibe (ZETIA) 10 MG tablet Take 1 tablet (10 mg total) by mouth daily.   fluticasone (FLONASE) 50 MCG/ACT nasal spray Place 2 sprays into both nostrils daily.   ketorolac (ACULAR) 0.5 % ophthalmic solution Place 1 drop into the right eye 4 (four) times daily.   losartan (COZAAR) 100 MG tablet Take 1 tablet (100 mg total) by mouth daily.   Magnesium Gluconate 250 MG TABS Take 1 tablet (250 mg total) by mouth daily.   Multiple Vitamins-Minerals (ZINC PO) Take 1 tablet by mouth once a week.   pantoprazole (PROTONIX) 40 MG tablet Take 1 tablet (40 mg total) by mouth daily as needed.   phenylephrine (,USE FOR PREPARATION-H,) 0.25 % suppository Place 1 suppository rectally as needed for hemorrhoids.   prednisoLONE acetate (PRED FORTE) 1 % ophthalmic suspension Place 1 drop into the right eye 4  (four) times daily.    saxagliptin HCl (ONGLYZA) 2.5 MG TABS tablet Take 1 tablet (2.5 mg total) by mouth daily.   senna-docusate (SENOKOT-S) 8.6-50 MG tablet Take 1 tablet by mouth at bedtime. (Patient taking differently: Take 1 tablet by mouth at bedtime as needed.)   simethicone (GAS-X) 80 MG chewable tablet Chew 1 tablet (80 mg total) by mouth every 6 (six) hours as needed for flatulence.   sodium chloride (OCEAN) 0.65 % SOLN nasal spray Place 1 spray into both nostrils as needed for congestion.   [DISCONTINUED] budesonide-formoterol (SYMBICORT) 160-4.5 MCG/ACT inhaler Inhale 2 puffs into the lungs 2 (two) times daily. PLEASE GIVE PATIENT 3 MONTH SUPPLY. (Patient not taking: Reported on 03/23/2024)   [DISCONTINUED] fexofenadine (ALLEGRA) 180 MG tablet Take 180 mg by mouth as needed.  (Patient not taking: Reported on 03/23/2024)   [DISCONTINUED] fluticasone (FLONASE) 50 MCG/ACT nasal spray Place 2 sprays into both nostrils daily. (Patient not taking: Reported on 03/23/2024)   [DISCONTINUED] gabapentin (NEURONTIN) 100 MG capsule Take  1 capsule (100 mg total) by mouth 2 (two) times daily as needed. For pain (Patient not taking: Reported on 01/16/2024)   No facility-administered encounter medications on file as of 03/23/2024.    Review of Systems  Constitutional:  Negative for appetite change, chills, fatigue, fever and unexpected weight change.  HENT:  Positive for congestion, postnasal drip, rhinorrhea, sinus pressure and sinus pain. Negative for ear discharge, ear pain, facial swelling, hearing loss, nosebleeds, sneezing, sore throat, tinnitus and trouble swallowing.   Eyes:  Negative for pain, discharge, redness, itching and visual disturbance.       Eyes Tearing   Respiratory:  Negative for cough, chest tightness, shortness of breath and wheezing.   Cardiovascular:  Negative for chest pain, palpitations and leg swelling.  Gastrointestinal:  Negative for abdominal distention, abdominal pain, blood  in stool, constipation, diarrhea, nausea and vomiting.  Skin:  Negative for color change, pallor and rash.  Neurological:  Negative for dizziness, light-headedness and headaches.    Immunization History  Administered Date(s) Administered   Fluad Quad(high Dose 65+) 08/29/2019, 09/02/2020, 09/28/2021, 08/23/2022   Fluad Trivalent(High Dose 65+) 10/10/2023   Influenza Split 08/25/2010, 08/26/2011, 10/04/2012   Influenza Whole 09/24/2009   Influenza, High Dose Seasonal PF 08/16/2018   Influenza,inj,Quad PF,6+ Mos 09/11/2013, 09/04/2014, 11/12/2015, 10/04/2016, 09/06/2017   Influenza-Unspecified 09/02/2018   Moderna Covid-19 Fall Seasonal Vaccine 88yrs & older 08/19/2023   PFIZER(Purple Top)SARS-COV-2 Vaccination 02/11/2020, 03/03/2020, 09/17/2020, 07/04/2021, 10/09/2021   Pneumococcal Conjugate-13 02/17/2012   Pneumococcal Polysaccharide-23 05/19/2016   Tdap 02/17/2012   Zoster Recombinant(Shingrix) 10/15/2021, 02/02/2022   Pertinent  Health Maintenance Due  Topic Date Due   OPHTHALMOLOGY EXAM  10/13/2022   HEMOGLOBIN A1C  07/11/2024   INFLUENZA VACCINE  07/13/2024   FOOT EXAM  01/15/2025   DEXA SCAN  Completed      01/11/2023    9:19 AM 01/17/2023    8:56 AM 01/16/2024    9:02 AM 01/26/2024   10:58 AM 03/23/2024    1:26 PM  Fall Risk  Falls in the past year? 0 0 0 0 0  Was there an injury with Fall? 0 0 0 0 0  Fall Risk Category Calculator 0 0 0 0 0  Patient at Risk for Falls Due to No Fall Risks No Fall Risks   No Fall Risks  Fall risk Follow up Falls evaluation completed Falls evaluation completed   Falls evaluation completed   Functional Status Survey:    Vitals:   03/23/24 1332  BP: 136/80  Pulse: 67  Resp: 19  Temp: 97.8 F (36.6 C)  SpO2: 97%  Weight: 164 lb (74.4 kg)  Height: 5\' 1"  (1.549 m)   Body mass index is 30.99 kg/m. Physical Exam GENERAL: Alert, cooperative, well developed, no acute distress. HEENT: Normocephalic, normal oropharynx, moist mucous  membranes, nasal mucosa swollen bilaterally, frontal and maxillary sinuses tender. CHEST: Clear to auscultation bilaterally, no wheezes, rhonchi, or crackles. CARDIOVASCULAR: Normal heart rate and rhythm, S1 and S2 normal without murmurs. ABDOMEN: Soft, non-tender, non-distended, without organomegaly, normal bowel sounds. EXTREMITIES: No cyanosis or edema. NEUROLOGICAL: Cranial nerves grossly intact, moves all extremities without gross motor or sensory deficit.      Labs reviewed: Recent Labs    07/07/23 0859 01/12/24 0803  NA 138 141  K 4.4 4.6  CL 106 106  CO2 27 28  GLUCOSE 128* 123*  BUN 16 16  CREATININE 0.61 0.73  CALCIUM 9.5 9.5   Recent Labs    07/07/23 0859  01/12/24 0803  AST 14 12  ALT 14 11  BILITOT 0.5 0.5  PROT 7.3 7.5   Recent Labs    07/07/23 0859 01/12/24 0803  WBC 5.5 6.1  NEUTROABS 2,096 2,044  HGB 13.7 13.5  HCT 41.8 41.2  MCV 78.4* 77.6*  PLT 162 202   Lab Results  Component Value Date   TSH 2.28 01/12/2024   Lab Results  Component Value Date   HGBA1C 6.6 (H) 01/12/2024   Lab Results  Component Value Date   CHOL 187 01/12/2024   HDL 56 01/12/2024   LDLCALC 117 (H) 01/12/2024   TRIG 53 01/12/2024   CHOLHDL 3.3 01/12/2024    Significant Diagnostic Results in last 30 days:  No results found.  Assessment/Plan  Acute recurrent Sinusitis Presents with symptoms indicative of sinusitis, including nasal congestion, post-nasal drip, tearing eyes, and tenderness over the frontal and maxillary sinuses. Symptoms persist despite antihistamines like Zyrtec and Xyzal, and nasal sprays. Nasal passages are swollen, and she reports yellow mucus with occasional epistaxis, likely from blowing the nose. No fever or chills, and lungs are clear. A bacterial component is suspected given the persistence and nature of symptoms. Azithromycin (Z-Pak) is chosen for its efficacy in treating respiratory infections, with a regimen of two tablets on the first day  followed by one tablet daily for four days. - Prescribe azithromycin (Z-Pak) for respiratory infection - Recommend use of Neti pot for nasal irrigation - Advise purchase and use of over-the-counter Flonase after nasal irrigation - Suggest warm compresses over sinuses to help loosen mucus - Encourage drinking warm fluids like soup or tea to help open sinuses - Recommend Mucinex for cough and drainage, with instructions to take one pill twice a day and drink plenty of fluids  Allergic Rhinitis Allergy symptoms partially relieved by Xyzal but not sinus congestion. Previous antihistamines like Zyrtec and Claritin provided limited success. Symptoms exacerbated by environmental factors such as pollen. Flonase is recommended to reduce nasal swelling after nasal irrigation. - Continue Xyzal for allergy management - Consider using saline nasal spray or Neti pot for additional relief - Discuss the potential use of Flonase to reduce nasal swelling  Constipation Uses Senokot as needed for constipation but is unsure about the frequency of use. It can be used daily or as needed, depending on bowel habits. - Use Senokot as needed or daily for constipation management  General Health Maintenance Has not reordered some vitamins and supplements, including vitamin B12 and magnesium, but plans to do so. Currently taking Centrum. - Reorder and continue vitamin B12 and magnesium supplements as planned   Family/ staff Communication: Reviewed plan of care with patient verbalized understanding   Labs/tests ordered: None   Next Appointment: Return if symptoms worsen or fail to improve.   Total time: 25 minutes. Greater than 50% of total time spent doing patient education regarding acute recurrent maxillary sinusitis,constipation,health maintenance including symptom/medication management.   Caesar Bookman, NP

## 2024-05-16 ENCOUNTER — Ambulatory Visit: Payer: Medicare Other | Admitting: Podiatry

## 2024-05-17 ENCOUNTER — Ambulatory Visit: Admitting: Family

## 2024-05-17 ENCOUNTER — Encounter: Payer: Self-pay | Admitting: Family

## 2024-05-17 ENCOUNTER — Ambulatory Visit: Payer: Self-pay

## 2024-05-17 VITALS — BP 138/82 | HR 75 | Temp 97.4°F | Ht 61.0 in | Wt 160.2 lb

## 2024-05-17 DIAGNOSIS — K649 Unspecified hemorrhoids: Secondary | ICD-10-CM

## 2024-05-17 DIAGNOSIS — K5901 Slow transit constipation: Secondary | ICD-10-CM | POA: Diagnosis not present

## 2024-05-17 DIAGNOSIS — R195 Other fecal abnormalities: Secondary | ICD-10-CM | POA: Diagnosis not present

## 2024-05-17 DIAGNOSIS — R109 Unspecified abdominal pain: Secondary | ICD-10-CM

## 2024-05-17 DIAGNOSIS — K219 Gastro-esophageal reflux disease without esophagitis: Secondary | ICD-10-CM

## 2024-05-17 LAB — POCT URINALYSIS DIPSTICK
Blood, UA: NEGATIVE
Glucose, UA: NEGATIVE
Leukocytes, UA: NEGATIVE
Nitrite, UA: NEGATIVE
Protein, UA: NEGATIVE
Spec Grav, UA: 1.015 (ref 1.010–1.025)
Urobilinogen, UA: 0.2 U/dL
pH, UA: 6 (ref 5.0–8.0)

## 2024-05-17 LAB — POC HEMOCCULT BLD/STL (OFFICE/1-CARD/DIAGNOSTIC): Fecal Occult Blood, POC: POSITIVE — AB

## 2024-05-17 MED ORDER — PE-SHARK LIVER OIL-COCOA BUTTR 0.25-3-85.5 % RE SUPP
1.0000 | RECTAL | 0 refills | Status: DC | PRN
Start: 2024-05-17 — End: 2024-09-02

## 2024-05-17 MED ORDER — PANTOPRAZOLE SODIUM 40 MG PO TBEC: 40.0000 mg | DELAYED_RELEASE_TABLET | Freq: Every day | ORAL | 1 refills | Status: DC

## 2024-05-17 MED ORDER — WITCH HAZEL-GLYCERIN EX PADS: 1.0000 | MEDICATED_PAD | CUTANEOUS | 12 refills | Status: AC | PRN

## 2024-05-17 MED ORDER — MAGNESIUM OXIDE -MG SUPPLEMENT 500 MG PO CAPS: 1000.0000 mg | ORAL_CAPSULE | Freq: Every day | ORAL | 3 refills | Status: AC

## 2024-05-17 NOTE — Progress Notes (Signed)
 Provider: Christean Courts FNP-C  Kharlie Bring, Elijio Guadeloupe, NP  Patient Care Team: Joseff Luckman, Elijio Guadeloupe, NP as PCP - General (Family Medicine) Delilah Fend, MD (Inactive) as Consulting Physician (Gastroenterology) Devin Foerster, MD as Consulting Physician (Ophthalmology) Seward Dao Alleen Arbour, MD as Consulting Physician (Ophthalmology) Calista Catching, MD as Referring Physician (Psychiatry) Algernon Antigua (Inactive) (Orthotics)  Extended Emergency Contact Information Primary Emergency Contact: JAMES,THOMAS Home Phone: [phone] Relation: None  Code Status:  Full Code  Goals of care: Advanced Directive information    03/23/2024    1:26 PM  Advanced Directives  Does Patient Have a Medical Advance Directive? No  Would patient like information on creating a medical advance directive? No - Patient declined     Chief Complaint  Patient presents with   Abdominal Pain    Hemorrhoids/ pain for about a week   Magnerium oxide sultrate rx  Gas in sides and back preparation h & 2 gas pills  Nasuea , belching , no vomiting     Discussed the use of AI scribe software for clinical note transcription with the patient, who gave verbal consent to proceed.  History of Present Illness   Tanya Bell is an 83 year old female who presents with constipation and associated symptoms.  She experiences hemorrhoids that are painful and cause a sensation of spasm at the rectum, with pain radiating to her back and side. There is a small amount of blood in her stool. She has been using Preparation H and suppositories without relief.  Her bowel movements are irregular, with significant movements only after taking milk of magnesia. Stools are not solid, sometimes appearing in pieces or strings. She recalls a previous prescription of magnesium  oxide 500 mg capsules, which helped with bowel movements in the past. Currently, she takes magnesium  gluconate 250 mg, which has not been effective. Gas-X has also been  ineffective for gas relief.  She experiences a lot of gas and nausea, though she has not vomited. She has a history of acid reflux and previously took Protonix , which she stopped as she felt better. She also takes a multivitamin but has not been consistent with vitamin B12 or D supplements.  Her diet includes attempts to consume more vegetables and water, though she admits to possibly not drinking enough water, consuming about two to three 12-ounce bottles daily. She is not currently engaging in regular exercise, citing caregiving responsibilities for her husband, who has a history of cancer and stroke, as a limiting factor.    Past Medical History:  Diagnosis Date   Acute upper respiratory infections of unspecified site    Acute upper respiratory infections of unspecified site    Acute upper respiratory infections of unspecified site    Allergic rhinitis due to pollen    Candidiasis of mouth    Disorder of bone and cartilage, unspecified    Diverticulitis of colon (without mention of hemorrhage)(562.11)    Essential hypertension, benign    Essential hypertension, benign    Extrinsic asthma, unspecified    Herpes simplex with other ophthalmic complications    Other abnormal glucose    Other and unspecified hyperlipidemia    Other and unspecified hyperlipidemia    Other and unspecified hyperlipidemia    Other cataract    Other cataract    Other dystrophy of vulva    Other malaise and fatigue    Rash and other nonspecific skin eruption    Reflux esophagitis    Thrombocytopenia, unspecified (HCC)    Type  II or unspecified type diabetes mellitus without mention of complication, not stated as uncontrolled    Type II or unspecified type diabetes mellitus without mention of complication, not stated as uncontrolled    Unspecified disorder of skin and subcutaneous tissue    Unspecified essential hypertension    Unspecified essential hypertension    Past Surgical History:  Procedure  Laterality Date   ABDOMINAL HYSTERECTOMY     CESAREAN SECTION     [phone]   CYST EXCISION  1974   Pilonidal Cyst excision   EYE SURGERY Right 04/14/2017   Dr. Seward Dao   KNEE ARTHROSCOPY Right    for meniscal tear   RETINAL TEAR REPAIR CRYOTHERAPY  10/21/2017   Getting injections in right eye    Allergies  Allergen Reactions   Crestor  [Rosuvastatin  Calcium ]     Muscle cramps   Latex Other (See Comments)    GLOVES   Sulfa Antibiotics     Outpatient Encounter Medications as of 05/17/2024  Medication Sig   acetaminophen (TYLENOL) 325 MG tablet Take 325 mg by mouth daily as needed. For pain   albuterol  (VENTOLIN  HFA) 108 (90 Base) MCG/ACT inhaler Inhale 2 puffs into the lungs every 6 (six) hours as needed for wheezing or shortness of breath.   amLODipine  (NORVASC ) 5 MG tablet TAKE 1 TABLET(5 MG) BY MOUTH DAILY   calcium -vitamin D  (OSCAL WITH D) 500-200 MG-UNIT per tablet Take 1 tablet by mouth 2 (two) times daily.   carvedilol  (COREG ) 12.5 MG tablet TAKE 1 TABLET ONCE DAILY FOR BLOOD PRESSURE   cyanocobalamin (VITAMIN B12) 1000 MCG tablet Take 1 tablet (1,000 mcg total) by mouth daily.   ELDERBERRY PO Take by mouth 3 (three) times a week.   ezetimibe  (ZETIA ) 10 MG tablet Take 1 tablet (10 mg total) by mouth daily.   fluticasone  (FLONASE ) 50 MCG/ACT nasal spray Place 2 sprays into both nostrils daily.   ibuprofen (ADVIL) 200 MG tablet Take 200 mg by mouth as needed for moderate pain (pain score 4-6).   ketorolac (ACULAR) 0.5 % ophthalmic solution Place 1 drop into the right eye 4 (four) times daily.   losartan  (COZAAR ) 100 MG tablet Take 1 tablet (100 mg total) by mouth daily.   Magnesium  Gluconate 250 MG TABS Take 1 tablet (250 mg total) by mouth daily.   Magnesium  Oxide -Mg Supplement 500 MG CAPS Take 2 capsules (1,000 mg total) by mouth at bedtime.   Multiple Vitamins-Minerals (ZINC  PO) Take 1 tablet by mouth once a week.   phenylephrine  (,USE FOR PREPARATION-H,) 0.25 %  suppository Place 1 suppository rectally as needed for hemorrhoids.   prednisoLONE acetate (PRED FORTE) 1 % ophthalmic suspension Place 1 drop into the right eye 4 (four) times daily.    saxagliptin  HCl (ONGLYZA) 2.5 MG TABS tablet Take 1 tablet (2.5 mg total) by mouth daily.   senna-docusate (SENOKOT-S) 8.6-50 MG tablet Take 1 tablet by mouth at bedtime. (Patient taking differently: Take 1 tablet by mouth at bedtime as needed.)   shark liver oil-cocoa butter (PREPARATION H) 0.25-3-85.5 % suppository Place 1 suppository rectally as needed for hemorrhoids.   simethicone  (GAS-X) 80 MG chewable tablet Chew 1 tablet (80 mg total) by mouth every 6 (six) hours as needed for flatulence.   sodium chloride (OCEAN) 0.65 % SOLN nasal spray Place 1 spray into both nostrils as needed for congestion.   witch hazel-glycerin (TUCKS) pad Apply 1 Application topically as needed for itching.   azithromycin  (ZITHROMAX ) 250 MG tablet Take 2  tablets (500 mg ) by mouth x 1 dose then one tablet ( 250 mg ) by mouth daily x 4 days (Patient not taking: Reported on 05/17/2024)   pantoprazole  (PROTONIX ) 40 MG tablet Take 1 tablet (40 mg total) by mouth daily.   [DISCONTINUED] pantoprazole  (PROTONIX ) 40 MG tablet Take 1 tablet (40 mg total) by mouth daily as needed. (Patient not taking: Reported on 05/17/2024)   No facility-administered encounter medications on file as of 05/17/2024.    Review of Systems  Constitutional:  Negative for appetite change, chills, fatigue, fever and unexpected weight change.  Eyes:  Negative for pain, discharge, redness, itching and visual disturbance.  Respiratory:  Negative for cough, chest tightness, shortness of breath and wheezing.   Cardiovascular:  Negative for chest pain, palpitations and leg swelling.  Gastrointestinal:  Positive for constipation and nausea. Negative for abdominal distention, abdominal pain, blood in stool, diarrhea and vomiting.       Hemorrhoids   Genitourinary:  Negative  for difficulty urinating, dysuria, flank pain, frequency and urgency.       Suprapubic/abd  pressure  Musculoskeletal:  Positive for back pain. Negative for arthralgias, gait problem and joint swelling.  Skin:  Negative for color change, pallor and rash.  Neurological:  Negative for dizziness, weakness, light-headedness and headaches.  Psychiatric/Behavioral:  Negative for agitation, behavioral problems, confusion, hallucinations and sleep disturbance. The patient is not nervous/anxious.        Care giver burden     Immunization History  Administered Date(s) Administered   Fluad Quad(high Dose 65+) 08/29/2019, 09/02/2020, 09/28/2021, 08/23/2022   Fluad Trivalent(High Dose 65+) 10/10/2023   Influenza Split 08/25/2010, 08/26/2011, 10/04/2012   Influenza Whole 09/24/2009   Influenza, High Dose Seasonal PF 08/16/2018   Influenza,inj,Quad PF,6+ Mos 09/11/2013, 09/04/2014, 11/12/2015, 10/04/2016, 09/06/2017   Influenza-Unspecified 09/02/2018   Moderna Covid-19 Fall Seasonal Vaccine 34yrs & older 08/19/2023   PFIZER(Purple Top)SARS-COV-2 Vaccination 02/11/2020, 03/03/2020, 09/17/2020, 07/04/2021, 10/09/2021   Pneumococcal Conjugate-13 02/17/2012   Pneumococcal Polysaccharide-23 05/19/2016   Tdap 02/17/2012   Zoster Recombinant(Shingrix) 10/15/2021, 02/02/2022   Pertinent  Health Maintenance Due  Topic Date Due   HEMOGLOBIN A1C  07/11/2024   INFLUENZA VACCINE  07/13/2024   OPHTHALMOLOGY EXAM  12/18/2024   FOOT EXAM  01/15/2025   DEXA SCAN  Completed      01/11/2023    9:19 AM 01/17/2023    8:56 AM 01/16/2024    9:02 AM 01/26/2024   10:58 AM 03/23/2024    1:26 PM  Fall Risk  Falls in the past year? 0 0 0 0 0  Was there an injury with Fall? 0 0 0 0 0  Fall Risk Category Calculator 0 0 0 0 0  Patient at Risk for Falls Due to No Fall Risks No Fall Risks   No Fall Risks  Fall risk Follow up Falls evaluation completed Falls evaluation completed   Falls evaluation completed   Functional  Status Survey:    Vitals:   05/17/24 1348  BP: 138/82  Pulse: 75  Temp: (!) 97.4 F (36.3 C)  TempSrc: Temporal  Weight: 160 lb 3.2 oz (72.7 kg)  Height: 5\' 1"  (1.549 m)   Body mass index is 30.27 kg/m. Physical Exam GENERAL: Alert, cooperative, well developed, no acute distress. HEENT: Normocephalic, normal oropharynx, moist mucous membranes. CHEST: Clear to auscultation bilaterally, no wheezes, rhonchi, or crackles. CARDIOVASCULAR: Normal heart rate and rhythm, S1 and S2 normal without murmurs. ABDOMEN: Soft, non-distended, with abdominal tenderness, without organomegaly, normal bowel  sounds. RECTAL: External hemorrhoids irritated and red, internal hemorrhoids present with pressure, occult blood in stool. Chaperone Shana Daring and [place] CMA present throughout the exam. EXTREMITIES: No cyanosis or edema. NEUROLOGICAL: Cranial nerves grossly intact, moves all extremities without gross motor or sensory deficit.      Labs reviewed: Recent Labs    07/07/23 0859 01/12/24 0803  NA 138 141  K 4.4 4.6  CL 106 106  CO2 27 28  GLUCOSE 128* 123*  BUN 16 16  CREATININE 0.61 0.73  CALCIUM  9.5 9.5   Recent Labs    07/07/23 0859 01/12/24 0803  AST 14 12  ALT 14 11  BILITOT 0.5 0.5  PROT 7.3 7.5   Recent Labs    07/07/23 0859 01/12/24 0803  WBC 5.5 6.1  NEUTROABS 2,096 2,044  HGB 13.7 13.5  HCT 41.8 41.2  MCV 78.4* 77.6*  PLT 162 202   Lab Results  Component Value Date   TSH 2.28 01/12/2024   Lab Results  Component Value Date   HGBA1C 6.6 (H) 01/12/2024   Lab Results  Component Value Date   CHOL 187 01/12/2024   HDL 56 01/12/2024   LDLCALC 117 (H) 01/12/2024   TRIG 53 01/12/2024   CHOLHDL 3.3 01/12/2024    Significant Diagnostic Results in last 30 days:  No results found.  Assessment/Plan Hemorrhoids Recurrent internal and external hemorrhoids associated with constipation, straining, and inadequate bowel movements. Symptoms include pain,  pressure, occasional bleeding, gas, bloating, and back pain. External hemorrhoids are irritated. Over-the-counter treatments provide limited relief. Concern for potential inflammation or infection, though no inflammation noted during exam. Chaperone Shana Daring and [facility] CMA present throughout the exam. - Prescribe magnesium  oxide 500 mg capsules, two at bedtime, to aid bowel movements. - Increase water intake to at least four 12-ounce bottles per day to soften stools. - Use Tucks pads on external hemorrhoids to reduce swelling. - Suggest sitz baths for pain relief. - Refer to gastroenterologist for further evaluation and management. - Order urine specimen to rule out urinary tract infection due to lower abdominal pressure. - Advise against NSAIDs to avoid gastrointestinal irritation. - Prescribe Protonix  for potential acid reflux and to manage internal bleeding. - Recommend Gas-X for gas relief.  Constipation Constipation with infrequent and irregular bowel movements, likely due to insufficient water intake and dietary fiber. Contributes to hemorrhoid development and exacerbation. - Encourage increased water intake and dietary fiber. - Prescribe magnesium  oxide 500 mg capsules to aid bowel movements. - Discuss use of Miralax  or Citrucel as needed for bowel regulation.  Acid Reflux Acid reflux with previous discontinuation of Protonix . Restarting Protonix  is recommended to manage symptoms and prevent gastrointestinal bleeding, especially with hemorrhoids and constipation. Avoid coffee and NSAIDs to prevent irritation. - Prescribe Protonix  once daily, preferably in the morning. - Advise avoiding coffee and NSAIDs.  General Health Maintenance Maintain hydration and dietary fiber intake for bowel health. Exercise is recommended. Continue vitamin B12 and vitamin D  supplements. Discussed benefits of sublingual B12 for absorption. - Encourage regular exercise, such as walking or biking. -  Advise taking vitamin B12 and vitamin D  supplements.  Follow-up Requires follow-up with a gastroenterologist for hemorrhoids and potential gastrointestinal issues. Referral to [facility] is planned. Follow-up with pharmacy for magnesium  oxide prescription is necessary. - Refer to gastroenterologist at [facility]. - Follow up with pharmacy for magnesium  oxide prescription.   Family/ staff Communication: Reviewed plan of care with patient verbalized understanding   Labs/tests ordered:  -  Urine Culture; Future - POC Urinalysis Dipstick   Next Appointment: Return if symptoms worsen or fail to improve.   Total time: 34 minutes. Greater than 50% of total time spent doing patient education regarding constipation,Hemorrhoids,Acid reflux ,health maintenance including symptom/medication management.   Estil Heman, NP

## 2024-05-17 NOTE — Telephone Encounter (Signed)
 FYI Only or Action Required?: FYI only for provider  Patient was last seen in primary care on 03/23/2024 by Tanya Bell, Tanya Guadeloupe, NP. Called Nurse Triage reporting Bloated, Abdominal Pain, and Constipation. Symptoms began a week ago. Interventions attempted: Miralax , Milk of Magnesia. Symptoms are: gradually worsening.  Triage Disposition: See Physician Within 24 Hours  Patient/caregiver understands and will follow disposition?: Unsure                     Copied from CRM [id number]. Topic: Clinical - Red Word Triage >> May 17, 2024  8:45 AM Tanya Bell wrote: Red Word that prompted transfer to Nurse Triage: Patient  having trouble with number  2 restroom for 1 week Bloating/pain in stomach Reason for Disposition  [1] MODERATE pain (e.g., interferes with normal activities) AND [2] pain comes and goes (cramps) AND [3] present > 24 hours  (Exception: Pain with Vomiting or Diarrhea - see that Guideline.)  Answer Assessment - Initial Assessment Questions Pt called asking for an appt with her previous GI doctor. RN advised pt she would likely need to be referred again and offered pt a same-day appt with pt's PCP. Pt declined and refused to answer the rest of triager's questions. RN transferred to CAL.      1. LOCATION: "Where does it hurt?"      "Belly, back, sides" 2. RADIATION: "Does the pain shoot anywhere else?" (e.g., chest, back)     Back  3. ONSET: "When did the pain begin?" (e.g., minutes, hours or days ago)      1 wk 4. SUDDEN: "Gradual or sudden onset?"     "I had a problem going to the bathroom, I strained some 5. PATTERN "Does the pain come and go, or is it constant?"    - If it comes and goes: "How long does it last?" "Do you have pain now?"     (Note: Comes and goes means the pain is intermittent. It goes away completely between bouts.)    - If constant: "Is it getting better, staying the same, or getting worse?"      (Note: Constant means the pain never goes away  completely; most serious pain is constant and gets worse.)      Come and go 6. SEVERITY: "How bad is the pain?"  (e.g., Scale 1-10; mild, moderate, or severe)    - MILD (1-3): Doesn't interfere with normal activities, abdomen soft and not tender to touch.     - MODERATE (4-7): Interferes with normal activities or awakens from sleep, abdomen tender to touch.     - SEVERE (8-10): Excruciating pain, doubled over, unable to do any normal activities.       4-5/10 7. RECURRENT SYMPTOM: "Have you ever had this type of stomach pain before?" If Yes, ask: "When was the last time?" and "What happened that time?"      "I've had this before when I went to Dr. Feliberto Bell" 8. CAUSE: "What do you think is causing the stomach pain?"     Not sure  9. RELIEVING/AGGRAVATING FACTORS: "What makes it better or worse?" (e.g., antacids, bending or twisting motion, bowel movement)     Passing gas helps relieve pain 10. OTHER SYMPTOMS: "Do you have any other symptoms?" (e.g., back pain, diarrhea, fever, urination pain, vomiting)       Took Milk of Magnesia and Miralax , had a small BM yesterday  Endorses nausea, is able to eat and drink normally. Pt endorses "a little bit  of blood yesterday", on the tissue  Protocols used: Abdominal Pain - [facility]

## 2024-05-17 NOTE — Telephone Encounter (Signed)
 Appointment scheduled with Dinah for today 05/17/2024

## 2024-05-18 LAB — URINE CULTURE
MICRO NUMBER:: 16544444
Result:: NO GROWTH
SPECIMEN QUALITY:: ADEQUATE

## 2024-05-21 ENCOUNTER — Ambulatory Visit: Payer: Self-pay | Admitting: Family

## 2024-05-28 ENCOUNTER — Telehealth: Payer: Self-pay

## 2024-05-28 NOTE — Telephone Encounter (Signed)
 Placard form completed and placed on outgoing fax box for adm staff to call patient to pickup form.

## 2024-05-28 NOTE — Telephone Encounter (Signed)
 Patient brought in a Handicapped Placard form to be completed and ask for a call back when the f orm is ready.

## 2024-05-28 NOTE — Telephone Encounter (Signed)
 Patient walked in requesting completion of a disability placard due to being unable to walk 200 feet with out stopping to rest due to her asthma diagnosis.  Form filled in and placed in Ngetich, Dinah C, NP review and sign folder.  Patient would like a call once completed to pick up

## 2024-05-29 NOTE — Telephone Encounter (Signed)
 Signed and placed to outgoing fax box.

## 2024-05-29 NOTE — Telephone Encounter (Signed)
 Form needs providers signature

## 2024-05-29 NOTE — Telephone Encounter (Signed)
Called and informed patient that form is ready for pick up

## 2024-06-04 ENCOUNTER — Ambulatory Visit (INDEPENDENT_AMBULATORY_CARE_PROVIDER_SITE_OTHER): Admitting: Podiatry

## 2024-06-04 ENCOUNTER — Other Ambulatory Visit: Payer: Self-pay | Admitting: Family

## 2024-06-04 ENCOUNTER — Telehealth: Payer: Self-pay | Admitting: *Deleted

## 2024-06-04 ENCOUNTER — Encounter: Payer: Self-pay | Admitting: Podiatry

## 2024-06-04 DIAGNOSIS — M79674 Pain in right toe(s): Secondary | ICD-10-CM | POA: Diagnosis not present

## 2024-06-04 DIAGNOSIS — E119 Type 2 diabetes mellitus without complications: Secondary | ICD-10-CM

## 2024-06-04 DIAGNOSIS — L84 Corns and callosities: Secondary | ICD-10-CM | POA: Diagnosis not present

## 2024-06-04 DIAGNOSIS — M79675 Pain in left toe(s): Secondary | ICD-10-CM

## 2024-06-04 DIAGNOSIS — B351 Tinea unguium: Secondary | ICD-10-CM

## 2024-06-04 MED ORDER — SAXAGLIPTIN HCL 2.5 MG PO TABS: 2.5000 mg | ORAL_TABLET | Freq: Every day | ORAL | 1 refills | Status: DC

## 2024-06-04 NOTE — Telephone Encounter (Signed)
 Can this referral be changed or does a new order need to be placed

## 2024-06-04 NOTE — Telephone Encounter (Signed)
 Copied from CRM [id number]. Topic: Referral - Request for Referral >> Jun 04, 2024 11:47 AM Carmell SAUNDERS wrote: Did the patient discuss referral with their provider in the last year? Yes (If No - schedule appointment) (If Yes - send message)  Appointment offered? No  Type of order/referral and detailed reason for visit: Gastro for issues with bowels. Was referred to [facility] but while dealing with them, they keep changing the appts. Pt feels they do not want to see her.  Preference of office, provider, location: [facility]  If referral order, have you been seen by this specialty before? Yes (If Yes, this issue or another issue? When? Where? Did see Dr. Herschell years ago.  Can we respond through MyChart? No

## 2024-06-04 NOTE — Telephone Encounter (Signed)
 Copied from CRM [id number]. Topic: Clinical - Medication Refill >> Jun 04, 2024  3:54 PM Carrielelia G wrote: Medication: saxagliptin  HCl (ONGLYZA) 2.5 MG TABS tablet  Has the patient contacted their pharmacy? Yes (Agent: If no, request that the patient contact the pharmacy for the refill. If patient does not wish to contact the pharmacy document the reason why and proceed with request.) (Agent: If yes, when and what did the pharmacy advise?)  This is the patient's preferred pharmacy:  [facility] - [place], [place] - [address] [place] [address] Phone: [phone] Fax: [phone]   Is this the correct pharmacy for this prescription? Yes If no, delete pharmacy and type the correct one.   Has the prescription been filled recently? Yes  Is the patient out of the medication? No  Has the patient been seen for an appointment in the last year OR does the patient have an upcoming appointment? Yes  Can we respond through MyChart? Yes  Agent: Please be advised that Rx refills may take up to 3 business days. We ask that you follow-up with your pharmacy.

## 2024-06-04 NOTE — Progress Notes (Signed)
 Subjective:  Patient ID: Tanya Bell, female    DOB: Apr 24, 1941,   MRN: [id number]  Chief Complaint  Patient presents with   Debridement    Trim toenails/calluses-diabetic - 6.6, ready to get more diabetic shoes for this year    83 y.o. female presents for concern of thickened elongated and painful nails that are difficult to trim. Requesting to have them trimmed today. Relates burning and tingling in their feet. Patient is diabetic and last A1c was  Lab Results  Component Value Date   HGBA1C 6.6 (H) 01/12/2024   .   PCP:  Ngetich, Roxan JAYSON, NP    . Denies any other pedal complaints. Denies n/v/f/c.   Past Medical History:  Diagnosis Date   Acute upper respiratory infections of unspecified site    Acute upper respiratory infections of unspecified site    Acute upper respiratory infections of unspecified site    Allergic rhinitis due to pollen    Candidiasis of mouth    Disorder of bone and cartilage, unspecified    Diverticulitis of colon (without mention of hemorrhage)(562.11)    Essential hypertension, benign    Essential hypertension, benign    Extrinsic asthma, unspecified    Herpes simplex with other ophthalmic complications    Other abnormal glucose    Other and unspecified hyperlipidemia    Other and unspecified hyperlipidemia    Other and unspecified hyperlipidemia    Other cataract    Other cataract    Other dystrophy of vulva    Other malaise and fatigue    Rash and other nonspecific skin eruption    Reflux esophagitis    Thrombocytopenia, unspecified (HCC)    Type II or unspecified type diabetes mellitus without mention of complication, not stated as uncontrolled    Type II or unspecified type diabetes mellitus without mention of complication, not stated as uncontrolled    Unspecified disorder of skin and subcutaneous tissue    Unspecified essential hypertension    Unspecified essential hypertension     Objective:  Physical Exam: Vascular: DP/PT  pulses 2/4 bilateral. CFT <3 seconds. Absent hair growth on digits. Edema noted to bilateral lower extremities. Xerosis noted bilaterally.  Skin. No lacerations or abrasions bilateral feet. Nails 1-5 bilateral  are thickened discolored and elongated with subungual debris. Hyperkeratotic lesion noted sub fifth metatarsal bilateral and sub first metatarsal on right  Musculoskeletal: MMT 5/5 bilateral lower extremities in DF, PF, Inversion and Eversion. Deceased ROM in DF of ankle joint. Bilateral HAV deformitty and hammered digits 2-5 bilateral.  Neurological: Sensation intact to light touch. Protective sensation diminished bilateral. ]   Assessment:   1. Pain due to onychomycosis of toenails of both feet   2. Callus   3. Controlled type 2 diabetes mellitus without complication, without long-term current use of insulin (HCC)      Plan:  Patient was evaluated and treated and all questions answered. -Discussed and educated patient on diabetic foot care, especially with  regards to the vascular, neurological and musculoskeletal systems.  -Stressed the importance of good glycemic control and the detriment of not  controlling glucose levels in relation to the foot. -Discussed supportive shoes at all times and checking feet regularly.  -Mechanically debrided all nails 1-5 bilateral using sterile nail nipper and filed with dremel without incident  -Hyperkeratotic area debrided without incident with chisel x 3. Bilateral fifth metatarsal head and sub first metatarsal head on right -Answered all patient questions -Patient to return  in 3 months  for at risk foot care -Patient advised to call the office if any problems or questions arise in the meantime.   Asberry Failing, DPM

## 2024-06-05 NOTE — Telephone Encounter (Signed)
 Copied from CRM [id number]. Topic: Referral - Status >> Jun 04, 2024  4:03 PM Marda MATSU wrote: Patient calling what is the status of the referral to [facility].  Please advise     Forwarded message to [facility], Referral Coordinator.

## 2024-06-13 ENCOUNTER — Encounter: Payer: Self-pay | Admitting: Gastroenterology

## 2024-07-04 ENCOUNTER — Other Ambulatory Visit: Payer: Self-pay | Admitting: Family

## 2024-07-04 DIAGNOSIS — I10 Essential (primary) hypertension: Secondary | ICD-10-CM

## 2024-07-04 MED ORDER — AMLODIPINE BESYLATE 5 MG PO TABS: ORAL_TABLET | ORAL | 1 refills | Status: AC

## 2024-07-04 NOTE — Telephone Encounter (Signed)
 Copied from CRM [id number]. Topic: Clinical - Medication Refill >> Jul 04, 2024  9:45 AM Miquel SAILOR wrote: Medication: amLODipine  (NORVASC ) 5 MG tablet   Has the patient contacted their pharmacy? Yes (Agent: If no, request that the patient contact the pharmacy for the refill. If patient does not wish to contact the pharmacy document the reason why and proceed with request.) (Agent: If yes, when and what did the pharmacy advise?)  This is the patient's preferred pharmacy:  [facility] - [place], [place] - [address] [place] [address] Phone: [phone] Fax: [phone]    Is this the correct pharmacy for this prescription? Yes If no, delete pharmacy and type the correct one.   Has the prescription been filled recently? Yes  Is the patient out of the medication? no  Has the patient been seen for an appointment in the last year OR does the patient have an upcoming appointment? Yes  Can we respond through MyChart? Yes  Agent: Please be advised that Rx refills may take up to 3 business days. We ask that you follow-up with your pharmacy.

## 2024-07-10 ENCOUNTER — Other Ambulatory Visit: Payer: Medicare Other

## 2024-07-10 DIAGNOSIS — E785 Hyperlipidemia, unspecified: Secondary | ICD-10-CM | POA: Diagnosis not present

## 2024-07-10 DIAGNOSIS — E1142 Type 2 diabetes mellitus with diabetic polyneuropathy: Secondary | ICD-10-CM | POA: Diagnosis not present

## 2024-07-10 DIAGNOSIS — K219 Gastro-esophageal reflux disease without esophagitis: Secondary | ICD-10-CM

## 2024-07-10 DIAGNOSIS — D649 Anemia, unspecified: Secondary | ICD-10-CM | POA: Diagnosis not present

## 2024-07-12 ENCOUNTER — Ambulatory Visit: Payer: Self-pay | Admitting: Family

## 2024-07-13 LAB — COMPLETE METABOLIC PANEL WITHOUT GFR
AG Ratio: 1.2 (calc) (ref 1.0–2.5)
ALT: 13 U/L (ref 6–29)
AST: 13 U/L (ref 10–35)
Albumin: 4.3 g/dL (ref 3.6–5.1)
Alkaline phosphatase (APISO): 70 U/L (ref 37–153)
BUN/Creatinine Ratio: 28 (calc) — ABNORMAL HIGH (ref 6–22)
BUN: 16 mg/dL (ref 7–25)
CO2: 24 mmol/L (ref 20–32)
Calcium: 9.8 mg/dL (ref 8.6–10.4)
Chloride: 105 mmol/L (ref 98–110)
Creat: 0.57 mg/dL — ABNORMAL LOW (ref 0.60–0.95)
Globulin: 3.6 g/dL (ref 1.9–3.7)
Glucose, Bld: 105 mg/dL — ABNORMAL HIGH (ref 65–99)
Potassium: 3.7 mmol/L (ref 3.5–5.3)
Sodium: 140 mmol/L (ref 135–146)
Total Bilirubin: 0.6 mg/dL (ref 0.2–1.2)
Total Protein: 7.9 g/dL (ref 6.1–8.1)

## 2024-07-13 LAB — CBC WITH DIFFERENTIAL/PLATELET
Absolute Lymphocytes: 2565 {cells}/uL (ref 850–3900)
Absolute Monocytes: 450 {cells}/uL (ref 200–950)
Basophils Absolute: 29 {cells}/uL (ref 0–200)
Basophils Relative: 0.5 %
Eosinophils Absolute: 228 {cells}/uL (ref 15–500)
Eosinophils Relative: 4 %
HCT: 44 % (ref 35.0–45.0)
Hemoglobin: 13.9 g/dL (ref 11.7–15.5)
MCH: 25.2 pg — ABNORMAL LOW (ref 27.0–33.0)
MCHC: 31.6 g/dL — ABNORMAL LOW (ref 32.0–36.0)
MCV: 79.9 fL — ABNORMAL LOW (ref 80.0–100.0)
MPV: 11.7 fL (ref 7.5–12.5)
Monocytes Relative: 7.9 %
Neutro Abs: 2428 {cells}/uL (ref 1500–7800)
Neutrophils Relative %: 42.6 %
Platelets: 168 Thousand/uL (ref 140–400)
RBC: 5.51 Million/uL — ABNORMAL HIGH (ref 3.80–5.10)
RDW: 16.2 % — ABNORMAL HIGH (ref 11.0–15.0)
Total Lymphocyte: 45 %
WBC: 5.7 Thousand/uL (ref 3.8–10.8)

## 2024-07-13 LAB — TSH: TSH: 1.04 m[IU]/L (ref 0.40–4.50)

## 2024-07-13 LAB — MICROALBUMIN / CREATININE URINE RATIO
Creatinine, Urine: 125 mg/dL (ref 20–275)
Microalb Creat Ratio: 18 mg/g{creat} (ref <30)
Microalb, Ur: 2.3 mg/dL

## 2024-07-13 LAB — HEMOGLOBIN A1C
Hgb A1c MFr Bld: 6.2 % — ABNORMAL HIGH (ref <5.7)
Mean Plasma Glucose: 131 mg/dL
eAG (mmol/L): 7.3 mmol/L

## 2024-07-13 LAB — TEST AUTHORIZATION

## 2024-07-13 LAB — LIPID PANEL
Cholesterol: 182 mg/dL (ref <200)
HDL: 60 mg/dL (ref >=50)
LDL Cholesterol (Calc): 108 mg/dL — ABNORMAL HIGH
Non-HDL Cholesterol (Calc): 122 mg/dL (ref <130)
Total CHOL/HDL Ratio: 3 (calc) (ref <5.0)
Triglycerides: 46 mg/dL (ref <150)

## 2024-07-13 LAB — IRON,TIBC AND FERRITIN PANEL
%SAT: 34 % (ref 16–45)
Ferritin: 147 ng/mL (ref 16–288)
Iron: 101 ug/dL (ref 45–160)
TIBC: 298 ug/dL (ref 250–450)

## 2024-07-18 ENCOUNTER — Encounter: Payer: Self-pay | Admitting: Family

## 2024-07-18 ENCOUNTER — Ambulatory Visit (INDEPENDENT_AMBULATORY_CARE_PROVIDER_SITE_OTHER): Payer: Medicare Other | Admitting: Family

## 2024-07-18 VITALS — BP 128/76 | HR 60 | Temp 97.7°F | Resp 17 | Ht 61.0 in | Wt 159.2 lb

## 2024-07-18 DIAGNOSIS — E785 Hyperlipidemia, unspecified: Secondary | ICD-10-CM | POA: Diagnosis not present

## 2024-07-18 DIAGNOSIS — L989 Disorder of the skin and subcutaneous tissue, unspecified: Secondary | ICD-10-CM

## 2024-07-18 DIAGNOSIS — K219 Gastro-esophageal reflux disease without esophagitis: Secondary | ICD-10-CM

## 2024-07-18 DIAGNOSIS — K5901 Slow transit constipation: Secondary | ICD-10-CM | POA: Diagnosis not present

## 2024-07-18 DIAGNOSIS — E1142 Type 2 diabetes mellitus with diabetic polyneuropathy: Secondary | ICD-10-CM | POA: Diagnosis not present

## 2024-07-18 DIAGNOSIS — R5383 Other fatigue: Secondary | ICD-10-CM | POA: Diagnosis not present

## 2024-07-18 DIAGNOSIS — I10 Essential (primary) hypertension: Secondary | ICD-10-CM

## 2024-07-18 MED ORDER — PANTOPRAZOLE SODIUM 40 MG PO TBEC: 40.0000 mg | DELAYED_RELEASE_TABLET | Freq: Every day | ORAL | 1 refills | Status: AC

## 2024-07-18 MED ORDER — SENNOSIDES-DOCUSATE SODIUM 8.6-50 MG PO TABS: 1.0000 | ORAL_TABLET | Freq: Every day | ORAL | 3 refills | Status: AC

## 2024-07-18 NOTE — Progress Notes (Signed)
 Provider: Roxan Plough FNP-C   Brittney Mucha, Roxan BROCKS, NP  Patient Care Team: Yesena Reaves, Roxan BROCKS, NP as PCP - General (Family Medicine) Dyane Rush, MD (Inactive) as Consulting Physician (Gastroenterology) Octavia Bruckner, MD as Consulting Physician (Ophthalmology) Elner Arley LABOR, MD as Consulting Physician (Ophthalmology) Georg Gaskins, MD as Referring Physician (Psychiatry) Jearline Hurl (Inactive) (Orthotics)  Extended Emergency Contact Information Primary Emergency Contact: JAMES,THOMAS Home Phone: [phone] Relation: None  Code Status:  Full Code  Goals of care: Advanced Directive information    07/18/2024    9:53 AM  Advanced Directives  Does Patient Have a Medical Advance Directive? No  Would patient like information on creating a medical advance directive? No - Patient declined     Chief Complaint  Patient presents with   Medical Management of Chronic Issues    6 month follow up.    Discussed the use of AI scribe software for clinical note transcription with the patient, who gave verbal consent to proceed.  History of Present Illness   Tanya Bell is an 83 year old female with diabetes and hypertension who presents for a six-month follow-up appointment.  Her diabetes is well-controlled with blood sugar levels typically in the 90s, though they fluctuate. Fasting glucose was 105, slightly above the desired range. Her A1c has improved from 6.6 to 6.2. She takes Onglyza for diabetes management.  She has a history of anemia, with current hemoglobin at 13.9. Despite normal iron and ferritin levels, she experiences significant fatigue and continues to take B12 supplements sometimes. Has not been taking her multivitamins and vitamin C but would like to restart.  She experiences chronic constipation, using remedies like Activia, prune puree, and magnesium  supplements. Bowel movements are successful only when the stool is soft, and she sometimes strains. Hemorrhoid  ointment and suppositories provide some relief. She has an upcoming appointment at [facility] for further evaluation. She uses Preparation H for hemorrhoids.  Her cholesterol levels have improved, with total cholesterol at 182, HDL at 60, triglycerides at 46, and LDL at 108. She has been eating more eggs, which may have contributed to the LDL level. She takes Zetia  for cholesterol management.  She takes several medications including carvedilol , losartan , Flonase , and amlodipine . She also takes various vitamins and supplements including B12, D3, and a multivitamin.  No numbness or tingling in her legs and no pain in her calf muscles. She experiences pressure on her forehead sometimes and has a lot of mucus from sinus issues in the morning.    Past Medical History:  Diagnosis Date   Acute upper respiratory infections of unspecified site    Acute upper respiratory infections of unspecified site    Acute upper respiratory infections of unspecified site    Allergic rhinitis due to pollen    Candidiasis of mouth    Disorder of bone and cartilage, unspecified    Diverticulitis of colon (without mention of hemorrhage)(562.11)    Essential hypertension, benign    Essential hypertension, benign    Extrinsic asthma, unspecified    Herpes simplex with other ophthalmic complications    Other abnormal glucose    Other and unspecified hyperlipidemia    Other and unspecified hyperlipidemia    Other and unspecified hyperlipidemia    Other cataract    Other cataract    Other dystrophy of vulva    Other malaise and fatigue    Rash and other nonspecific skin eruption    Reflux esophagitis    Thrombocytopenia, unspecified (HCC)  Type II or unspecified type diabetes mellitus without mention of complication, not stated as uncontrolled    Type II or unspecified type diabetes mellitus without mention of complication, not stated as uncontrolled    Unspecified disorder of skin and  subcutaneous tissue    Unspecified essential hypertension    Unspecified essential hypertension    Past Surgical History:  Procedure Laterality Date   ABDOMINAL HYSTERECTOMY     CESAREAN SECTION     [id number]   CYST EXCISION  1974   Pilonidal Cyst excision   EYE SURGERY Right 04/14/2017   Dr. Elner   KNEE ARTHROSCOPY Right    for meniscal tear   RETINAL TEAR REPAIR CRYOTHERAPY  10/21/2017   Getting injections in right eye    Allergies  Allergen Reactions   Crestor  [Rosuvastatin  Calcium ]     Muscle cramps   Latex Other (See Comments)    GLOVES   Sulfa Antibiotics     Allergies as of 07/18/2024       Reactions   Crestor  [rosuvastatin  Calcium ]    Muscle cramps   Latex Other (See Comments)   GLOVES   Sulfa Antibiotics         Medication List        Accurate as of July 18, 2024  7:54 PM. If you have any questions, ask your nurse or doctor.          acetaminophen 325 MG tablet Commonly known as: TYLENOL Take 325 mg by mouth daily as needed. For pain   albuterol  108 (90 Base) MCG/ACT inhaler Commonly known as: Ventolin  HFA Inhale 2 puffs into the lungs every 6 (six) hours as needed for wheezing or shortness of breath.   amLODipine  5 MG tablet Commonly known as: NORVASC  TAKE 1 TABLET(5 MG) BY MOUTH DAILY   calcium -vitamin D  500-200 MG-UNIT tablet Commonly known as: OSCAL WITH D Take 1 tablet by mouth 2 (two) times daily.   carvedilol  12.5 MG tablet Commonly known as: COREG  TAKE 1 TABLET ONCE DAILY FOR BLOOD PRESSURE   cyanocobalamin 1000 MCG tablet Commonly known as: VITAMIN B12 Take 1 tablet (1,000 mcg total) by mouth daily.   ELDERBERRY PO Take by mouth 3 (three) times a week.   ezetimibe  10 MG tablet Commonly known as: ZETIA  Take 1 tablet (10 mg total) by mouth daily.   fluticasone  50 MCG/ACT nasal spray Commonly known as: FLONASE  Place 2 sprays into both nostrils daily.   ibuprofen 200 MG tablet Commonly known as: ADVIL Take  200 mg by mouth as needed for moderate pain (pain score 4-6).   ketorolac 0.5 % ophthalmic solution Commonly known as: ACULAR Place 1 drop into the right eye 4 (four) times daily.   losartan  100 MG tablet Commonly known as: Cozaar  Take 1 tablet (100 mg total) by mouth daily.   Magnesium  Gluconate 250 MG Tabs Take 1 tablet (250 mg total) by mouth daily.   Magnesium  Oxide -Mg Supplement 500 MG Caps Take 2 capsules (1,000 mg total) by mouth at bedtime.   pantoprazole  40 MG tablet Commonly known as: PROTONIX  Take 1 tablet (40 mg total) by mouth daily.   phenylephrine  0.25 % suppository Commonly known as: (USE for PREPARATION-H) Place 1 suppository rectally as needed for hemorrhoids.   prednisoLONE acetate 1 % ophthalmic suspension Commonly known as: PRED FORTE Place 1 drop into the right eye 4 (four) times daily.   saxagliptin  HCl 2.5 MG Tabs tablet Commonly known as: ONGLYZA Take 1 tablet (2.5 mg total) by  mouth daily.   senna-docusate 8.6-50 MG tablet Commonly known as: Senokot-S Take 1 tablet by mouth at bedtime.   shark liver oil-cocoa butter 0.25-3-85.5 % suppository Commonly known as: PREPARATION H Place 1 suppository rectally as needed for hemorrhoids.   simethicone  80 MG chewable tablet Commonly known as: Gas-X Chew 1 tablet (80 mg total) by mouth every 6 (six) hours as needed for flatulence.   sodium chloride 0.65 % Soln nasal spray Commonly known as: OCEAN Place 1 spray into both nostrils as needed for congestion.   witch hazel-glycerin  pad Commonly known as: TUCKS Apply 1 Application topically as needed for itching.   ZINC  PO Take 1 tablet by mouth once a week.        Review of Systems  Constitutional:  Negative for appetite change, chills, fatigue, fever and unexpected weight change.  HENT:  Negative for congestion, dental problem, ear discharge, ear pain, facial swelling, hearing loss, nosebleeds, postnasal drip, rhinorrhea, sinus pressure, sinus  pain, sneezing, sore throat, tinnitus and trouble swallowing.   Eyes:  Negative for pain, discharge, redness, itching and visual disturbance.  Respiratory:  Negative for cough, chest tightness, shortness of breath and wheezing.   Cardiovascular:  Negative for chest pain, palpitations and leg swelling.  Gastrointestinal:  Negative for abdominal distention, abdominal pain, blood in stool, constipation, diarrhea, nausea and vomiting.  Endocrine: Negative for cold intolerance, heat intolerance, polydipsia, polyphagia and polyuria.  Genitourinary:  Negative for difficulty urinating, dysuria, flank pain, frequency and urgency.  Musculoskeletal:  Negative for arthralgias, back pain, gait problem, joint swelling, myalgias, neck pain and neck stiffness.  Skin:  Negative for color change, pallor, rash and wound.  Neurological:  Negative for dizziness, syncope, speech difficulty, weakness, light-headedness, numbness and headaches.  Hematological:  Does not bruise/bleed easily.  Psychiatric/Behavioral:  Negative for agitation, behavioral problems, confusion, hallucinations, self-injury, sleep disturbance and suicidal ideas. The patient is not nervous/anxious.     Immunization History  Administered Date(s) Administered   Fluad Quad(high Dose 65+) 08/29/2019, 09/02/2020, 09/28/2021, 08/23/2022   Fluad Trivalent(High Dose 65+) 10/10/2023   Influenza Split 08/25/2010, 08/26/2011, 10/04/2012   Influenza Whole 09/24/2009   Influenza, High Dose Seasonal PF 08/16/2018   Influenza,inj,Quad PF,6+ Mos 09/11/2013, 09/04/2014, 11/12/2015, 10/04/2016, 09/06/2017   Influenza-Unspecified 09/02/2018   Moderna Covid-19 Fall Seasonal Vaccine 74yrs & older 08/19/2023   PFIZER(Purple Top)SARS-COV-2 Vaccination 02/11/2020, 03/03/2020, 09/17/2020, 07/04/2021, 10/09/2021   Pneumococcal Conjugate-13 02/17/2012   Pneumococcal Polysaccharide-23 05/19/2016   Tdap 02/17/2012   Zoster Recombinant(Shingrix) 10/15/2021, 02/02/2022    Pertinent  Health Maintenance Due  Topic Date Due   INFLUENZA VACCINE  07/13/2024   OPHTHALMOLOGY EXAM  12/18/2024   HEMOGLOBIN A1C  01/10/2025   FOOT EXAM  01/15/2025   DEXA SCAN  Completed      01/17/2023    8:56 AM 01/16/2024    9:02 AM 01/26/2024   10:58 AM 03/23/2024    1:26 PM 07/18/2024    9:53 AM  Fall Risk  Falls in the past year? 0 0 0 0 0  Was there an injury with Fall? 0 0 0 0 0  Fall Risk Category Calculator 0 0 0 0 0  Patient at Risk for Falls Due to No Fall Risks   No Fall Risks No Fall Risks  Fall risk Follow up Falls evaluation completed   Falls evaluation completed Falls evaluation completed   Functional Status Survey:    Vitals:   07/18/24 1002  BP: 128/76  Pulse: 60  Resp: 17  Temp:  97.7 F (36.5 C)  SpO2: 96%  Weight: 159 lb 3.2 oz (72.2 kg)  Height: 5' 1 (1.549 m)   Body mass index is 30.08 kg/m. Physical Exam  GENERAL: Alert, cooperative, well developed, no acute distress. HEENT: Normocephalic, normal oropharynx, moist mucous membranes, ears normal with intact eardrums, nose normal. NECK: Neck normal, no swallowing difficulty. CHEST: Clear to auscultation bilaterally, no wheezes, rhonchi, or crackles. CARDIOVASCULAR: Normal heart rate and rhythm, S1 and S2 normal without murmurs. ABDOMEN: Soft, non-tender, non-distended, without organomegaly, normal bowel sounds. EXTREMITIES: No cyanosis or edema, strong pulses, without numbness or tingling. NEUROLOGICAL: Cranial nerves grossly intact, moves all extremities without gross motor or sensory deficit, sensation intact in feet. SKIN: No signs of fungal infection or wounds, cherry angiomas on Left upper abdomen, benign. Dark oval shaped rough skin lesion on mid and upper back  PSYCHIATRY/BEHAVIORAL: Mood stable   Labs reviewed: Recent Labs    01/12/24 0803 07/10/24 0906  NA 141 140  K 4.6 3.7  CL 106 105  CO2 28 24  GLUCOSE 123* 105*  BUN 16 16  CREATININE 0.73 0.57*  CALCIUM  9.5 9.8    Recent Labs    01/12/24 0803 07/10/24 0906  AST 12 13  ALT 11 13  BILITOT 0.5 0.6  PROT 7.5 7.9   Recent Labs    01/12/24 0803 07/10/24 0906  WBC 6.1 5.7  NEUTROABS 2,044 2,428  HGB 13.5 13.9  HCT 41.2 44.0  MCV 77.6* 79.9*  PLT 202 168   Lab Results  Component Value Date   TSH 1.04 07/10/2024   Lab Results  Component Value Date   HGBA1C 6.2 (H) 07/10/2024   Lab Results  Component Value Date   CHOL 182 07/10/2024   HDL 60 07/10/2024   LDLCALC 108 (H) 07/10/2024   TRIG 46 07/10/2024   CHOLHDL 3.0 07/10/2024    Significant Diagnostic Results in last 30 days:  No results found.  Assessment/Plan  Chronic constipation and hemorrhoids Chronic constipation with associated hemorrhoids, requiring straining and use of hemorrhoid ointment. Magnesium  oxide was ineffective. - Use Miralax  daily, up to twice a day - Use magnesium  citrate as needed for severe constipation - Take Senokot at bedtime, adjust frequency based on bowel movement consistency - Continue using hemorrhoid ointment as needed  Type 2 diabetes mellitus, well controlled Blood glucose levels are well controlled with fasting glucose at 105 mg/dL and YaJ8r reduced from 6.6% to 6.2%. She is considered prediabetic but shows good control of blood sugars. - Continue current diabetes management regimen - Encourage dietary modifications to maintain blood sugar control - Recheck HbA1c in six months  Hypertension - Blood pressure well controlled  - continue on Losartan ,amlodipine  and Carvedilol    Hyperlipidemia Cholesterol levels are well managed with total cholesterol at 182 mg/dL, HDL at 60 mg/dL, triglycerides at 46 mg/dL, and LDL at 891 mg/dL. LDL is slightly above the target of 100 mg/dL. - Continue current lipid management regimen - Encourage dietary modifications to reduce LDL, such as reducing animal products and increasing plant-based foods  Gastroesophageal reflux disease (GERD) GERD is managed  with Protonix . - Refill Protonix  prescription for home delivery  Fatigue, unspecified Reports of fatigue, possibly related to vitamin deficiencies. Recent lab work shows no anemia, but indices suggest monitoring of iron and folate levels. - Take multivitamin with minerals - Take vitamin C and B12 supplements - Recheck lab work in six months  Allergic rhinitis Allergic rhinitis managed with nasal spray as needed. Reports morning sinus  mucus. - Continue using nasal spray as needed for symptom relief  Benign skin nevi, multiple Multiple benign skin nevi noted, with one on the right lower back and another in the middle of the upper back. - Monitor skin nevi for changes in size or color - Consider dermatology referral if nevi increase in size  skin lesion   Dark oval shaped rough skin lesion on mid and upper back  - refer to dermatologist  - continue to monitor   Bunion, right great toe Bunion presents on the right great toe, not currently causing significant issues. - No ulceration   Family/ staff Communication: Reviewed plan of care with patient verbalized understanding   Labs/tests ordered:  - CBC with Differential/Platelet - CMP with eGFR([facility]) - TSH - Hgb A1C - Lipid panel  Next Appointment : Return in about 6 months (around 01/18/2025) for medical mangement of chronic issues., Fasting labs in 6 months prior to visit.   Spent 30 minutes of Face to face and non-face to face with patient  >50% time spent counseling; reviewing medical record; tests; labs; documentation and developing future plan of care.   Roxan JAYSON Plough, NP

## 2024-08-03 ENCOUNTER — Ambulatory Visit: Admitting: Gastroenterology

## 2024-08-29 ENCOUNTER — Other Ambulatory Visit: Payer: Self-pay | Admitting: Family

## 2024-08-29 DIAGNOSIS — E785 Hyperlipidemia, unspecified: Secondary | ICD-10-CM

## 2024-08-29 MED ORDER — LOSARTAN POTASSIUM 100 MG PO TABS: 100.0000 mg | ORAL_TABLET | Freq: Every day | ORAL | 1 refills | Status: AC

## 2024-08-29 MED ORDER — EZETIMIBE 10 MG PO TABS: 10.0000 mg | ORAL_TABLET | Freq: Every day | ORAL | 1 refills | Status: DC

## 2024-08-29 NOTE — Telephone Encounter (Signed)
 Copied from CRM [id number]. Topic: Clinical - Medication Refill >> Aug 29, 2024 10:45 AM Suzette B wrote: Medication: ezetimibe  (ZETIA ) 10 MG tablet - need short term supply called in to the [facility] on [address]  losartan  (COZAAR ) 100 MG tablet  Has the patient contacted their pharmacy? Yes: advised the patient to call in a request for a new prescription, patient stated she needed a short term supply of the Zetia  sent to [facility], but the remainder of the prescription refills would go to [facility]    This is the patient's preferred pharmacy:  [facility] - [place], [place] - [address] [place] [address] Phone: [phone] Fax: [phone]  [facility] [phone] - [place], [place] - [address] AT [facility] [address] [facility] [address] [place] [address] Phone: [phone] Fax: [phone]  Is this the correct pharmacy for this prescription? Yes If no, delete pharmacy and type the correct one.   Has the prescription been filled recently? No 02/25 and 03/25  Is the patient out of the medication? Yes  Has the patient been seen for an appointment in the last year OR does the patient have an upcoming appointment? Yes  Can we respond through MyChart? Yes  Agent: Please be advised that Rx refills may take up to 3 business days. We ask that you follow-up with your pharmacy.

## 2024-08-30 ENCOUNTER — Other Ambulatory Visit: Payer: Self-pay

## 2024-08-30 DIAGNOSIS — E785 Hyperlipidemia, unspecified: Secondary | ICD-10-CM

## 2024-08-30 MED ORDER — EZETIMIBE 10 MG PO TABS
10.0000 mg | ORAL_TABLET | Freq: Every day | ORAL | 1 refills | Status: AC
Start: 2024-08-30 — End: ?

## 2024-08-30 NOTE — Telephone Encounter (Signed)
 Spoke with patient regarding medication refill  Zetia   patient needed a short term supply sent to her local pharmacy until she received the rx in the mail. I went ahead and sent pts short term supply to pts preferred pharmacy. Also informed patient that both of her prescriptions has been sent to [facility] mail delivery.

## 2024-09-01 ENCOUNTER — Emergency Department (HOSPITAL_COMMUNITY)

## 2024-09-01 ENCOUNTER — Inpatient Hospital Stay (HOSPITAL_COMMUNITY)
Admission: EM | Admit: 2024-09-01 | Discharge: 2024-09-11 | DRG: 563 | Disposition: A | Discharge status: Skilled Nursing Facility | Attending: Family Medicine | Admitting: Family Medicine

## 2024-09-01 DIAGNOSIS — S43015A Anterior dislocation of left humerus, initial encounter: Secondary | ICD-10-CM | POA: Diagnosis present

## 2024-09-01 DIAGNOSIS — Z87891 Personal history of nicotine dependence: Secondary | ICD-10-CM

## 2024-09-01 DIAGNOSIS — J45909 Unspecified asthma, uncomplicated: Secondary | ICD-10-CM | POA: Diagnosis present

## 2024-09-01 DIAGNOSIS — E1142 Type 2 diabetes mellitus with diabetic polyneuropathy: Secondary | ICD-10-CM | POA: Diagnosis not present

## 2024-09-01 DIAGNOSIS — I6782 Cerebral ischemia: Secondary | ICD-10-CM | POA: Diagnosis not present

## 2024-09-01 DIAGNOSIS — Z888 Allergy status to other drugs, medicaments and biological substances status: Secondary | ICD-10-CM

## 2024-09-01 DIAGNOSIS — Z79899 Other long term (current) drug therapy: Secondary | ICD-10-CM | POA: Diagnosis not present

## 2024-09-01 DIAGNOSIS — H409 Unspecified glaucoma: Secondary | ICD-10-CM | POA: Diagnosis not present

## 2024-09-01 DIAGNOSIS — S3993XA Unspecified injury of pelvis, initial encounter: Secondary | ICD-10-CM | POA: Diagnosis not present

## 2024-09-01 DIAGNOSIS — R0902 Hypoxemia: Secondary | ICD-10-CM | POA: Diagnosis not present

## 2024-09-01 DIAGNOSIS — I10 Essential (primary) hypertension: Secondary | ICD-10-CM | POA: Diagnosis not present

## 2024-09-01 DIAGNOSIS — S43035A Inferior dislocation of left humerus, initial encounter: Secondary | ICD-10-CM | POA: Diagnosis not present

## 2024-09-01 DIAGNOSIS — N179 Acute kidney failure, unspecified: Secondary | ICD-10-CM | POA: Diagnosis present

## 2024-09-01 DIAGNOSIS — S43014A Anterior dislocation of right humerus, initial encounter: Principal | ICD-10-CM | POA: Diagnosis present

## 2024-09-01 DIAGNOSIS — E860 Dehydration: Secondary | ICD-10-CM | POA: Diagnosis present

## 2024-09-01 DIAGNOSIS — M24412 Recurrent dislocation, left shoulder: Secondary | ICD-10-CM | POA: Diagnosis not present

## 2024-09-01 DIAGNOSIS — S143XXA Injury of brachial plexus, initial encounter: Secondary | ICD-10-CM | POA: Diagnosis not present

## 2024-09-01 DIAGNOSIS — E119 Type 2 diabetes mellitus without complications: Secondary | ICD-10-CM

## 2024-09-01 DIAGNOSIS — Z91040 Latex allergy status: Secondary | ICD-10-CM | POA: Diagnosis not present

## 2024-09-01 DIAGNOSIS — Z8669 Personal history of other diseases of the nervous system and sense organs: Secondary | ICD-10-CM | POA: Diagnosis not present

## 2024-09-01 DIAGNOSIS — S43004A Unspecified dislocation of right shoulder joint, initial encounter: Secondary | ICD-10-CM | POA: Diagnosis not present

## 2024-09-01 DIAGNOSIS — Y92512 Supermarket, store or market as the place of occurrence of the external cause: Secondary | ICD-10-CM | POA: Diagnosis not present

## 2024-09-01 DIAGNOSIS — K219 Gastro-esophageal reflux disease without esophagitis: Secondary | ICD-10-CM | POA: Diagnosis not present

## 2024-09-01 DIAGNOSIS — J9811 Atelectasis: Secondary | ICD-10-CM | POA: Diagnosis not present

## 2024-09-01 DIAGNOSIS — E785 Hyperlipidemia, unspecified: Secondary | ICD-10-CM | POA: Diagnosis present

## 2024-09-01 DIAGNOSIS — M19012 Primary osteoarthritis, left shoulder: Secondary | ICD-10-CM | POA: Diagnosis not present

## 2024-09-01 DIAGNOSIS — S43006A Unspecified dislocation of unspecified shoulder joint, initial encounter: Secondary | ICD-10-CM | POA: Diagnosis present

## 2024-09-01 DIAGNOSIS — G8321 Monoplegia of upper limb affecting right dominant side: Secondary | ICD-10-CM | POA: Diagnosis not present

## 2024-09-01 DIAGNOSIS — W19XXXA Unspecified fall, initial encounter: Principal | ICD-10-CM | POA: Diagnosis present

## 2024-09-01 DIAGNOSIS — M6281 Muscle weakness (generalized): Secondary | ICD-10-CM | POA: Diagnosis not present

## 2024-09-01 DIAGNOSIS — E1141 Type 2 diabetes mellitus with diabetic mononeuropathy: Secondary | ICD-10-CM | POA: Diagnosis present

## 2024-09-01 DIAGNOSIS — R069 Unspecified abnormalities of breathing: Secondary | ICD-10-CM | POA: Diagnosis not present

## 2024-09-01 DIAGNOSIS — Z90710 Acquired absence of both cervix and uterus: Secondary | ICD-10-CM

## 2024-09-01 DIAGNOSIS — G319 Degenerative disease of nervous system, unspecified: Secondary | ICD-10-CM | POA: Diagnosis not present

## 2024-09-01 DIAGNOSIS — M85811 Other specified disorders of bone density and structure, right shoulder: Secondary | ICD-10-CM | POA: Diagnosis not present

## 2024-09-01 DIAGNOSIS — M19011 Primary osteoarthritis, right shoulder: Secondary | ICD-10-CM | POA: Diagnosis not present

## 2024-09-01 DIAGNOSIS — M25519 Pain in unspecified shoulder: Secondary | ICD-10-CM | POA: Diagnosis not present

## 2024-09-01 DIAGNOSIS — S43005A Unspecified dislocation of left shoulder joint, initial encounter: Secondary | ICD-10-CM | POA: Diagnosis not present

## 2024-09-01 DIAGNOSIS — Z882 Allergy status to sulfonamides status: Secondary | ICD-10-CM

## 2024-09-01 DIAGNOSIS — D571 Sickle-cell disease without crisis: Secondary | ICD-10-CM | POA: Diagnosis not present

## 2024-09-01 DIAGNOSIS — I700 Atherosclerosis of aorta: Secondary | ICD-10-CM | POA: Diagnosis not present

## 2024-09-01 DIAGNOSIS — M4982 Spondylopathy in diseases classified elsewhere, cervical region: Secondary | ICD-10-CM | POA: Diagnosis not present

## 2024-09-01 DIAGNOSIS — M25512 Pain in left shoulder: Secondary | ICD-10-CM | POA: Diagnosis not present

## 2024-09-01 DIAGNOSIS — M24411 Recurrent dislocation, right shoulder: Secondary | ICD-10-CM | POA: Diagnosis not present

## 2024-09-01 LAB — CBC WITH DIFFERENTIAL/PLATELET
Abs Immature Granulocytes: 0.04 K/uL (ref 0.00–0.07)
Basophils Absolute: 0.1 K/uL (ref 0.0–0.1)
Basophils Relative: 0 %
Eosinophils Absolute: 0.2 K/uL (ref 0.0–0.5)
Eosinophils Relative: 2 %
HCT: 39.4 % (ref 36.0–46.0)
Hemoglobin: 13.6 g/dL (ref 12.0–15.0)
Immature Granulocytes: 0 %
Lymphocytes Relative: 24 %
Lymphs Abs: 2.7 K/uL (ref 0.7–4.0)
MCH: 26 pg (ref 26.0–34.0)
MCHC: 34.5 g/dL (ref 30.0–36.0)
MCV: 75.3 fL — ABNORMAL LOW (ref 80.0–100.0)
Monocytes Absolute: 0.9 K/uL (ref 0.1–1.0)
Monocytes Relative: 8 %
Neutro Abs: 7.2 K/uL (ref 1.7–7.7)
Neutrophils Relative %: 66 %
Platelets: 177 K/uL (ref 150–400)
RBC: 5.23 MIL/uL — ABNORMAL HIGH (ref 3.87–5.11)
RDW: 16.3 % — ABNORMAL HIGH (ref 11.5–15.5)
WBC: 11.1 K/uL — ABNORMAL HIGH (ref 4.0–10.5)
nRBC: 0 % (ref 0.0–0.2)

## 2024-09-01 LAB — BASIC METABOLIC PANEL WITH GFR
Anion gap: 13 (ref 5–15)
BUN: 24 mg/dL — ABNORMAL HIGH (ref 8–23)
CO2: 20 mmol/L — ABNORMAL LOW (ref 22–32)
Calcium: 9.3 mg/dL (ref 8.9–10.3)
Chloride: 105 mmol/L (ref 98–111)
Creatinine, Ser: 0.72 mg/dL (ref 0.44–1.00)
GFR, Estimated: >60 mL/min (ref >60)
Glucose, Bld: 130 mg/dL — ABNORMAL HIGH (ref 70–99)
Potassium: 4.5 mmol/L (ref 3.5–5.1)
Sodium: 138 mmol/L (ref 135–145)

## 2024-09-01 MED ORDER — ONDANSETRON HCL 4 MG/2ML IJ SOLN
4.0000 mg | Freq: Once | INTRAMUSCULAR | Status: AC
  Administered 2024-09-01: 4 mg via INTRAVENOUS
  Filled 2024-09-01: qty 2

## 2024-09-01 MED ORDER — PROPOFOL 10 MG/ML IV BOLUS
INTRAVENOUS | Status: AC
  Filled 2024-09-01: qty 20

## 2024-09-01 MED ORDER — PROPOFOL 10 MG/ML IV BOLUS
INTRAVENOUS | Status: AC | PRN
  Administered 2024-09-01: 30 mg via INTRAVENOUS

## 2024-09-01 MED ORDER — ALBUTEROL SULFATE HFA 108 (90 BASE) MCG/ACT IN AERS: 2.0000 | INHALATION_SPRAY | Freq: Four times a day (QID) | RESPIRATORY_TRACT | Status: DC | PRN

## 2024-09-01 MED ORDER — AMLODIPINE BESYLATE 5 MG PO TABS
5.0000 mg | ORAL_TABLET | Freq: Every day | ORAL | Status: DC
  Administered 2024-09-02 – 2024-09-03 (×2): 5 mg via ORAL
  Filled 2024-09-01 (×3): qty 1

## 2024-09-01 MED ORDER — PROPOFOL 10 MG/ML IV BOLUS
INTRAVENOUS | Status: AC | PRN
  Administered 2024-09-01: 50 mg via INTRAVENOUS

## 2024-09-01 MED ORDER — LOSARTAN POTASSIUM 50 MG PO TABS
100.0000 mg | ORAL_TABLET | Freq: Every day | ORAL | Status: DC
Start: 2024-09-02 — End: 2024-09-03
  Administered 2024-09-02 – 2024-09-03 (×2): 100 mg via ORAL
  Filled 2024-09-01 (×2): qty 2

## 2024-09-01 MED ORDER — PROPOFOL 10 MG/ML IV BOLUS
INTRAVENOUS | Status: AC | PRN
Start: 2024-09-01 — End: 2024-09-01
  Administered 2024-09-01: 20 mg via INTRAVENOUS

## 2024-09-01 MED ORDER — OXYCODONE HCL 5 MG PO TABS
5.0000 mg | ORAL_TABLET | Freq: Four times a day (QID) | ORAL | Status: DC | PRN
Start: 2024-09-01 — End: 2024-09-11
  Administered 2024-09-02 – 2024-09-10 (×14): 5 mg via ORAL
  Filled 2024-09-01 (×14): qty 1

## 2024-09-01 MED ORDER — IBUPROFEN 200 MG PO TABS: 200.0000 mg | ORAL_TABLET | ORAL | Status: DC | PRN

## 2024-09-01 MED ORDER — CARVEDILOL 12.5 MG PO TABS
12.5000 mg | ORAL_TABLET | Freq: Every day | ORAL | Status: DC
  Administered 2024-09-02 – 2024-09-03 (×2): 12.5 mg via ORAL
  Filled 2024-09-01 (×3): qty 1

## 2024-09-01 MED ORDER — LINAGLIPTIN 5 MG PO TABS
2.5000 mg | ORAL_TABLET | Freq: Every day | ORAL | Status: DC
  Administered 2024-09-02 – 2024-09-03 (×2): 2.5 mg via ORAL
  Filled 2024-09-01 (×2): qty 1

## 2024-09-01 MED ORDER — PROPOFOL 10 MG/ML IV BOLUS
1.0000 mg/kg | Freq: Once | INTRAVENOUS | Status: AC
  Administered 2024-09-01: 68.5 mg via INTRAVENOUS
  Filled 2024-09-01: qty 20

## 2024-09-01 MED ORDER — MORPHINE SULFATE (PF) 2 MG/ML IV SOLN
2.0000 mg | Freq: Once | INTRAVENOUS | Status: AC
  Administered 2024-09-01: 2 mg via INTRAVENOUS
  Filled 2024-09-01: qty 1

## 2024-09-01 MED ORDER — EZETIMIBE 10 MG PO TABS
10.0000 mg | ORAL_TABLET | Freq: Every day | ORAL | Status: DC
  Administered 2024-09-02 – 2024-09-11 (×10): 10 mg via ORAL
  Filled 2024-09-01 (×10): qty 1

## 2024-09-01 MED ORDER — PANTOPRAZOLE SODIUM 40 MG PO TBEC
40.0000 mg | DELAYED_RELEASE_TABLET | Freq: Every day | ORAL | Status: DC
Start: 2024-09-02 — End: 2024-09-11
  Administered 2024-09-02 – 2024-09-11 (×10): 40 mg via ORAL
  Filled 2024-09-01 (×10): qty 1

## 2024-09-01 MED ORDER — VITAMIN B-12 1000 MCG PO TABS
1000.0000 ug | ORAL_TABLET | Freq: Every day | ORAL | Status: DC
Start: 2024-09-02 — End: 2024-09-11
  Administered 2024-09-02 – 2024-09-11 (×10): 1000 ug via ORAL
  Filled 2024-09-01 (×10): qty 1

## 2024-09-01 MED ORDER — PROPOFOL 10 MG/ML IV BOLUS
INTRAVENOUS | Status: AC | PRN
  Administered 2024-09-01: 20 mg via INTRAVENOUS

## 2024-09-01 MED ORDER — LINAGLIPTIN 5 MG PO TABS: 5.0000 mg | ORAL_TABLET | Freq: Every day | ORAL | Status: DC

## 2024-09-01 MED ORDER — ACETAMINOPHEN 325 MG PO TABS
325.0000 mg | ORAL_TABLET | Freq: Every day | ORAL | Status: DC | PRN
  Administered 2024-09-02 – 2024-09-03 (×3): 325 mg via ORAL
  Filled 2024-09-01 (×3): qty 1

## 2024-09-01 NOTE — ED Notes (Signed)
 Xray verified shoulder placement on (left) was a success and right shoulder needs to be adjusted.

## 2024-09-01 NOTE — ED Notes (Signed)
 Both arms bilaterally reduced by Pamella Sharper DO. Left and right portable shoulder xray ordered to verify placement. PT alert and able to answer questions at this time.

## 2024-09-01 NOTE — ED Provider Notes (Signed)
  EMERGENCY DEPARTMENT AT [facility] Provider Note   CSN: [id number] Arrival date & time: 09/01/24  1727     Patient presents with: Tanya Bell is a 83 y.o. female.  With a history of type 2 diabetes, hypertension who presents to the ED after fall.  Patient was walking in a grocery store parking lot carrying groceries when she tripped and fell.  Fell onto outstretched arms.  Now with pain in both shoulders right greater than left.  No loss of consciousness.  Unable to get up at her own power.  No chest pain shortness of breath abdominal pain or pain in lower extremities.  100 mcg fentanyl  from EMS prior to arrival.  No anticoagulation    Fall       Prior to Admission medications   Medication Sig Start Date End Date Taking? Authorizing Provider  acetaminophen  (TYLENOL ) 325 MG tablet Take 325 mg by mouth daily as needed. For pain    [provider]  albuterol  (VENTOLIN  HFA) 108 (90 Base) MCG/ACT inhaler Inhale 2 puffs into the lungs every 6 (six) hours as needed for wheezing or shortness of breath. 04/04/20   Ngetich, Dinah C, NP  amLODipine  (NORVASC ) 5 MG tablet TAKE 1 TABLET(5 MG) BY MOUTH DAILY 07/04/24   Ngetich, Dinah C, NP  calcium -vitamin D  (OSCAL WITH D) 500-200 MG-UNIT per tablet Take 1 tablet by mouth 2 (two) times daily. 09/25/14   Arnetha Heft, MD  carvedilol  (COREG ) 12.5 MG tablet TAKE 1 TABLET ONCE DAILY FOR BLOOD PRESSURE 11/09/23   Ngetich, Dinah C, NP  cyanocobalamin  (VITAMIN B12) 1000 MCG tablet Take 1 tablet (1,000 mcg total) by mouth daily. 07/14/23   Ngetich, Dinah C, NP  ELDERBERRY PO Take by mouth 3 (three) times a week.    [provider]  ezetimibe  (ZETIA ) 10 MG tablet Take 1 tablet (10 mg total) by mouth daily. 08/30/24   Ngetich, Dinah C, NP  fluticasone  (FLONASE ) 50 MCG/ACT nasal spray Place 2 sprays into both nostrils daily. 03/23/24   Ngetich, Dinah C, NP  ibuprofen  (ADVIL ) 200 MG tablet Take 200 mg by mouth  as needed for moderate pain (pain score 4-6).    [provider]  ketorolac  (ACULAR ) 0.5 % ophthalmic solution Place 1 drop into the right eye 4 (four) times daily. 03/23/21   [provider]  losartan  (COZAAR ) 100 MG tablet Take 1 tablet (100 mg total) by mouth daily. 08/29/24   Ngetich, Dinah C, NP  Magnesium  Gluconate 250 MG TABS Take 1 tablet (250 mg total) by mouth daily. 07/14/23   Ngetich, Dinah C, NP  Magnesium  Oxide -Mg Supplement 500 MG CAPS Take 2 capsules (1,000 mg total) by mouth at bedtime. 05/17/24   Ngetich, Dinah C, NP  Multiple Vitamins-Minerals (ZINC  PO) Take 1 tablet by mouth once a week.    [provider]  pantoprazole  (PROTONIX ) 40 MG tablet Take 1 tablet (40 mg total) by mouth daily. 07/18/24   Ngetich, Dinah C, NP  phenylephrine  (,USE FOR PREPARATION-H,) 0.25 % suppository Place 1 suppository rectally as needed for hemorrhoids.    [provider]  prednisoLONE  acetate (PRED FORTE ) 1 % ophthalmic suspension Place 1 drop into the right eye 4 (four) times daily.     [provider]  saxagliptin  HCl (ONGLYZA) 2.5 MG TABS tablet Take 1 tablet (2.5 mg total) by mouth daily. 06/04/24   Ngetich, Dinah C, NP  senna-docusate (SENOKOT-S) 8.6-50 MG tablet Take 1 tablet  by mouth at bedtime. 07/18/24   Ngetich, Dinah C, NP  shark liver oil-cocoa butter (PREPARATION H) 0.25-3-85.5 % suppository Place 1 suppository rectally as needed for hemorrhoids. Patient not taking: Reported on 07/18/2024 05/17/24   Ngetich, Dinah C, NP  simethicone  (GAS-X) 80 MG chewable tablet Chew 1 tablet (80 mg total) by mouth every 6 (six) hours as needed for flatulence. 10/29/19   Ngetich, Dinah C, NP  sodium chloride  (OCEAN) 0.65 % SOLN nasal spray Place 1 spray into both nostrils as needed for congestion. 04/04/20   Ngetich, Dinah C, NP  witch hazel-glycerin  (TUCKS) pad Apply 1 Application topically as needed for itching. 05/17/24   Ngetich, Dinah C, NP    Allergies: Crestor   [rosuvastatin  calcium ], Latex, and Sulfa antibiotics    Review of Systems  Updated Vital Signs BP (!) 162/67   Pulse 80   Temp 98.3 F (36.8 C)   Resp 19   Ht 5' 1 (1.549 m)   Wt 68.5 kg   SpO2 96%   BMI 28.53 kg/m   Physical Exam Vitals and nursing note reviewed.  HENT:     Head: Normocephalic and atraumatic.     Comments: Superficial abrasion over chin Dentition intact Eyes:     Pupils: Pupils are equal, round, and reactive to light.  Cardiovascular:     Rate and Rhythm: Normal rate and regular rhythm.  Pulmonary:     Effort: Pulmonary effort is normal.     Breath sounds: Normal breath sounds.  Abdominal:     Palpations: Abdomen is soft.     Tenderness: There is no abdominal tenderness.  Musculoskeletal:     Cervical back: Neck supple. No tenderness.     Comments: Deformity of bilateral shoulders with anterior tenderness bilaterally Sensation intact to light touch throughout left upper extremity left hand with good range of motion and strength in left hand Sensation remains intact to pain over the right hand but decreased to light touch Severely limited strength in the right hand as patient able to gently wiggle fingers of the right hand only  Skin:    General: Skin is warm and dry.  Neurological:     Mental Status: She is alert.  Psychiatric:        Mood and Affect: Mood normal.     (all labs ordered are listed, but only abnormal results are displayed) Labs Reviewed  BASIC METABOLIC PANEL WITH GFR - Abnormal; Notable for the following components:      Result Value   CO2 20 (*)    Glucose, Bld 130 (*)    BUN 24 (*)    All other components within normal limits  CBC WITH DIFFERENTIAL/PLATELET - Abnormal; Notable for the following components:   WBC 11.1 (*)    RBC 5.23 (*)    MCV 75.3 (*)    RDW 16.3 (*)    All other components within normal limits    EKG: None  Radiology: DG Shoulder Right Portable Result Date: 09/01/2024 CLINICAL DATA:  Reduction  EXAM: RIGHT SHOULDER - 1 VIEW COMPARISON:  Right shoulder x-ray 09/01/2024 FINDINGS: Alignment is now anatomic. There some acromial joint space narrowing which can be seen with chronic rotator cuff tendinopathy. No distinct fracture identified. The bones are osteopenic. There are moderate degenerative changes of the acromioclavicular joint. IMPRESSION: Alignment is now anatomic. No distinct fracture identified. Electronically Signed   By: Greig Pique M.D.   On: 09/01/2024 21:46   DG Shoulder Left Portable Result Date: 09/01/2024 CLINICAL  DATA:  Reduction of the shoulder EXAM: LEFT SHOULDER COMPARISON:  Left shoulder x-ray 09/01/2024 FINDINGS: Left shoulder is now in anatomic alignment. No fractures are visualized. There are mild degenerative changes of the acromioclavicular joint. Soft tissues are within normal limits. IMPRESSION: Left shoulder is now in anatomic alignment. Electronically Signed   By: Greig Pique M.D.   On: 09/01/2024 21:32   DG Shoulder Right Portable Result Date: 09/01/2024 CLINICAL DATA:  Postreduction EXAM: RIGHT SHOULDER - 1 VIEW COMPARISON:  Right shoulder x-ray 09/01/2024 FINDINGS: There is anterior inferior dislocation of the humeral head, unchanged. Changes of Hill-Sachs injury again noted. There are moderate degenerative changes of the acromioclavicular joint. IMPRESSION: Anterior inferior dislocation of the humeral head, unchanged. Electronically Signed   By: Greig Pique M.D.   On: 09/01/2024 21:31   DG Pelvis 1-2 Views Result Date: 09/01/2024 CLINICAL DATA:  Trauma, fall EXAM: PELVIS - 1-2 VIEW COMPARISON:  None Available. FINDINGS: No acute bony abnormality. Specifically, no fracture, subluxation, or dislocation. Hip joints and SI joints symmetric. IMPRESSION: No acute bony abnormality. Electronically Signed   By: Franky Crease M.D.   On: 09/01/2024 19:25   DG Shoulder Right Result Date: 09/01/2024 CLINICAL DATA:  Fall, trauma EXAM: RIGHT SHOULDER - 2+ VIEW COMPARISON:   None Available. FINDINGS: There is anterior right shoulder dislocation anteroinferiorly. Mild impaction of the humeral head along the glenoid compatible with Hill-Sachs deformity. Degenerative changes in the [facility] joint. IMPRESSION: Anterior right shoulder dislocation with Hill-Sachs deformity. Electronically Signed   By: Franky Crease M.D.   On: 09/01/2024 19:25   DG Shoulder Left Result Date: 09/01/2024 CLINICAL DATA:  Fall, trauma EXAM: LEFT SHOULDER - 2+ VIEW COMPARISON:  None Available. FINDINGS: There is anterior inferior left shoulder dislocation. Impaction of the humeral head along the glenoid compatible with Hill-Sachs injury. Degenerative changes in the [facility] joint. IMPRESSION: Anterior left shoulder dislocation with Hill-Sachs deformity. Electronically Signed   By: Franky Crease M.D.   On: 09/01/2024 19:24   DG Chest 1 View Result Date: 09/01/2024 CLINICAL DATA:  Trauma, fall EXAM: CHEST  1 VIEW COMPARISON:  None Available. FINDINGS: Heart and mediastinal contours within normal limits. Aortic atherosclerosis. Bibasilar atelectasis. No effusions. Bilateral shoulder dislocations. IMPRESSION: Bibasilar atelectasis. Bilateral shoulder dislocations. Electronically Signed   By: Franky Crease M.D.   On: 09/01/2024 19:23   CT Head Wo Contrast Result Date: 09/01/2024 EXAM: CT HEAD AND CERVICAL SPINE 09/01/2024 07:02:22 PM TECHNIQUE: CT of the head and cervical spine was performed without the administration of intravenous contrast. Multiplanar reformatted images are provided for review. Automated exposure control, iterative reconstruction, and/or weight based adjustment of the mA/kV was utilized to reduce the radiation dose to as low as reasonably achievable. COMPARISON: None available. CLINICAL HISTORY: Head trauma, minor (Age >= 65y). Pt BIB [facility], fell getting into the car with her husband. Fell onto asphalt onto R elbow and hit chin. Abrasions R knee. C/o bilateral shoulder pain. No thinners. 100 mcg fentanyl   given en route. C collar in place on arrival. FINDINGS: CT HEAD BRAIN AND VENTRICLES: No acute intracranial hemorrhage. No mass effect or midline shift. No abnormal extra-axial fluid collection. No evidence of acute infarct. No hydrocephalus. Subcortical and periventricular small vessel ischemic changes. Age-related atrophy. ORBITS: No acute abnormality. SINUSES AND MASTOIDS: No acute abnormality. SOFT TISSUES AND SKULL: No acute skull fracture. No acute soft tissue abnormality. CT CERVICAL SPINE BONES AND ALIGNMENT: No acute fracture or traumatic malalignment. DEGENERATIVE CHANGES: Mild degenerative changes of the mid /  lower cervical spine, most prominent at C6-7. SOFT TISSUES: No prevertebral soft tissue swelling. IMPRESSION: 1. No acute intracranial abnormality. 2. No traumatic injury to the cervical spine. Electronically signed by: Pinkie Pebbles MD 09/01/2024 07:08 PM EDT RP Workstation: [id number]   CT Cervical Spine Wo Contrast Result Date: 09/01/2024 EXAM: CT HEAD AND CERVICAL SPINE 09/01/2024 07:02:22 PM TECHNIQUE: CT of the head and cervical spine was performed without the administration of intravenous contrast. Multiplanar reformatted images are provided for review. Automated exposure control, iterative reconstruction, and/or weight based adjustment of the mA/kV was utilized to reduce the radiation dose to as low as reasonably achievable. COMPARISON: None available. CLINICAL HISTORY: Head trauma, minor (Age >= 66y). Pt BIB [facility], fell getting into the car with her husband. Fell onto asphalt onto R elbow and hit chin. Abrasions R knee. C/o bilateral shoulder pain. No thinners. 100 mcg fentanyl  given en route. C collar in place on arrival. FINDINGS: CT HEAD BRAIN AND VENTRICLES: No acute intracranial hemorrhage. No mass effect or midline shift. No abnormal extra-axial fluid collection. No evidence of acute infarct. No hydrocephalus. Subcortical and periventricular small vessel ischemic changes.  Age-related atrophy. ORBITS: No acute abnormality. SINUSES AND MASTOIDS: No acute abnormality. SOFT TISSUES AND SKULL: No acute skull fracture. No acute soft tissue abnormality. CT CERVICAL SPINE BONES AND ALIGNMENT: No acute fracture or traumatic malalignment. DEGENERATIVE CHANGES: Mild degenerative changes of the mid / lower cervical spine, most prominent at C6-7. SOFT TISSUES: No prevertebral soft tissue swelling. IMPRESSION: 1. No acute intracranial abnormality. 2. No traumatic injury to the cervical spine. Electronically signed by: Pinkie Pebbles MD 09/01/2024 07:08 PM EDT RP Workstation: [id number]     .Sedation  Date/Time: 09/01/2024 10:10 PM  Performed by: Pamella Ozell LABOR, DO Authorized by: Pamella Ozell LABOR, DO   Consent:    Consent obtained:  Verbal   Consent given by:  Patient   Risks discussed:  Allergic reaction, dysrhythmia, inadequate sedation, nausea, prolonged hypoxia resulting in organ damage, prolonged sedation necessitating reversal, respiratory compromise necessitating ventilatory assistance and intubation and vomiting   Alternatives discussed:  Analgesia without sedation, anxiolysis and regional anesthesia Universal protocol:    Procedure explained and questions answered to patient or proxy's satisfaction: yes     Relevant documents present and verified: yes     Test results available: yes     Imaging studies available: yes     Required blood products, implants, devices, and special equipment available: yes     Site/side marked: yes     Immediately prior to procedure, a time out was called: yes     Patient identity confirmed:  Verbally with patient Indications:    Procedure necessitating sedation performed by:  Physician performing sedation Pre-sedation assessment:    Time since last food or drink:  1200   NPO status caution: unable to specify NPO status and urgency dictates proceeding with non-ideal NPO status     ASA classification: class 1 - normal, healthy  patient     Mouth opening:  3 or more finger widths   Thyromental distance:  4 finger widths   Mallampati score:  I - soft palate, uvula, fauces, pillars visible   Neck mobility: normal     Pre-sedation assessments completed and reviewed: pre-procedure airway patency not reviewed, pre-procedure cardiovascular function not reviewed, pre-procedure hydration status not reviewed, pre-procedure mental status not reviewed, pre-procedure nausea and vomiting status not reviewed, pre-procedure pain level not reviewed, pre-procedure respiratory function not reviewed and pre-procedure temperature not reviewed  A pre-sedation assessment was completed prior to the start of the procedure Immediate pre-procedure details:    Reassessment: Patient reassessed immediately prior to procedure     Reviewed: vital signs, relevant labs/tests and NPO status     Verified: bag valve mask available, emergency equipment available, intubation equipment available, IV patency confirmed, oxygen available and suction available   Procedure details (see MAR for exact dosages):    Preoxygenation:  Nasal cannula   Sedation:  Propofol    Intended level of sedation: deep   Intra-procedure monitoring:  Blood pressure monitoring, cardiac monitor, continuous pulse oximetry, frequent LOC assessments, frequent vital sign checks and continuous capnometry   Intra-procedure events: none     Total Provider sedation time (minutes):  20 Post-procedure details:   A post-sedation assessment was completed following the completion of the procedure.   Attendance: Constant attendance by certified staff until patient recovered     Recovery: Patient returned to pre-procedure baseline     Post-sedation assessments completed and reviewed: airway patency, cardiovascular function, hydration status, mental status, nausea/vomiting, pain level, respiratory function and temperature     Patient is stable for discharge or admission: yes     Procedure  completion:  Tolerated well, no immediate complications .Reduction of dislocation  Date/Time: 09/01/2024 10:00 PM  Performed by: Pamella Ozell LABOR, DO Authorized by: Pamella Ozell LABOR, DO  Consent: Verbal consent obtained. Written consent obtained Consent given by: patient Patient identity confirmed: verbally with patient Time out: Immediately prior to procedure a time out was called to verify the correct patient, procedure, equipment, support staff and site/side marked as required. Local anesthesia used: no  Anesthesia: Local anesthesia used: no  Sedation: Patient sedated: yes Sedatives: propofol   Patient tolerance: patient tolerated the procedure well with no immediate complications Comments: Successful reduction of left anterior shoulder dislocation on first attempt   .Reduction of dislocation  Date/Time: 09/01/2024 10:00 PM  Performed by: Pamella Ozell LABOR, DO Authorized by: Pamella Ozell LABOR, DO  Consent: Verbal consent obtained. Written consent obtained Imaging studies: imaging studies available Patient identity confirmed: verbally with patient Time out: Immediately prior to procedure a time out was called to verify the correct patient, procedure, equipment, support staff and site/side marked as required. Local anesthesia used: no  Anesthesia: Local anesthesia used: no  Sedation: Patient sedated: yes Sedatives: propofol   Patient tolerance: patient tolerated the procedure well with no immediate complications Comments: Successful reduction of right anterior shoulder dislocation on second attempt      Medications Ordered in the ED  propofol  (DIPRIVAN ) 10 mg/mL bolus/IV push 68.5 mg (has no administration in time range)  propofol  (DIPRIVAN ) 10 mg/mL bolus/IV push (20 mg Intravenous Given 09/01/24 2107)  propofol  (DIPRIVAN ) 10 mg/mL bolus/IV push (20 mg Intravenous Given 09/01/24 2110)  propofol  (DIPRIVAN ) 10 mg/mL bolus/IV push (30 mg Intravenous Given 09/01/24 2113)   acetaminophen  (TYLENOL ) tablet 325 mg (has no administration in time range)  albuterol  (VENTOLIN  HFA) 108 (90 Base) MCG/ACT inhaler 2 puff (has no administration in time range)  amLODipine  (NORVASC ) tablet 5 mg (has no administration in time range)  carvedilol  (COREG ) tablet 12.5 mg (has no administration in time range)  cyanocobalamin  (VITAMIN B12) tablet 1,000 mcg (has no administration in time range)  ezetimibe  (ZETIA ) tablet 10 mg (has no administration in time range)  ibuprofen  (ADVIL ) tablet 200 mg (has no administration in time range)  losartan  (COZAAR ) tablet 100 mg (has no administration in time range)  pantoprazole  (PROTONIX ) EC tablet 40 mg (has no administration  in time range)  oxyCODONE  (Oxy IR/ROXICODONE ) immediate release tablet 5 mg (has no administration in time range)  propofol  (DIPRIVAN ) 10 mg/mL bolus/IV push (50 mg Intravenous Given 09/01/24 2134)  linagliptin  (TRADJENTA ) tablet 2.5 mg (has no administration in time range)  morphine  (PF) 2 MG/ML injection 2 mg (2 mg Intravenous Given 09/01/24 1829)  ondansetron  (ZOFRAN ) injection 4 mg (4 mg Intravenous Given 09/01/24 1828)    Clinical Course as of 09/01/24 2240  Sat Sep 01, 2024  2000 Imaging shows no traumatic findings of the CT head C-spine or chest or pelvis x-ray.  There are however bilateral anterior shoulder dislocations both with Hill-Sachs deformities that require reduction under procedural sedation [MP]  2232 Under procedural sedation with propofol  successful reduction of left anterior shoulder dislocation on first attempt  Reduction of the right shoulder took 2 attempts.  Confirmation of successful reduction with postreduction x-rays.  Patient was placed in bilateral arm slings  She is able to move the left hand well still however remains unable to move right hand.  Can still sense light touch in the right hand.  I discussed his probable nerve palsy with Dr. Sharl (orthopedics).  No change in patient's management  aside from sling pain control.  Nerve palsy such as brachial plexus traction injury and radial nerve palsies commonly occur in elderly shoulder dislocations.  She will follow-up with Dr. Sharl in approximately 1 week for reevaluation in the office  Considering she is in bilateral shoulder slings she would be unable to perform activities of daily living at home.  No need for medical admission however we will keep her here overnight and plan for [facility] and physical therapy evaluation here in the ED tomorrow morning for likely rehabilitation placement.  Her husband is with her tonight and is also elderly with dementia.  We will allow him to spend the night with her here in the ED and provide him with food.  Patient home medications have been ordered and TOC consult has been placed.  I have added oxycodone  as needed 5 mg for severe pain related to shoulder dislocations [MP]    Clinical Course User Index [MP] Pamella Ozell LABOR, DO                                 Medical Decision Making 83 year old female with history as above presenting for injuries after fall.  She fell onto asphalt with outstretched hands carrying groceries.  Now with pain in both shoulders and a superficial abrasion over chin.  No other evidence of facial trauma.  Will obtain CT head C-spine and chest x-ray pelvis x-ray along with dedicated x-rays of both shoulders to evaluate for traumatic injury  Amount and/or Complexity of Data Reviewed Labs: ordered. Radiology: ordered.  Risk OTC drugs. Prescription drug management.        Final diagnoses:  Fall, initial encounter  Anterior dislocation of right shoulder, initial encounter  Anterior dislocation of left shoulder, initial encounter  Injury of right brachial plexus, initial encounter    ED Discharge Orders     None          Pamella Ozell LABOR, DO 09/01/24 2241

## 2024-09-01 NOTE — ED Notes (Signed)
 C Collar removed by Pamella, MD

## 2024-09-01 NOTE — Progress Notes (Signed)
RT assisted with conscious sedation.

## 2024-09-01 NOTE — ED Triage Notes (Addendum)
 Pt BIB [facility], fell getting into the car with her husband. Fell onto asphalt onto R elbow and hit chin. Abrasions R knee. C/o bilateral shoulder pain. No thinners. 100 mcg fentanyl  given en route. C collar in place on arrival.  88% RA after fentanyl , 98% 2L , VSS

## 2024-09-01 NOTE — ED Notes (Signed)
 Xray verified right shoulder was in correct placement. Procedure finished. Remainder of propofol  wasted.PT alert and and oriented x 4.

## 2024-09-01 NOTE — ED Notes (Signed)
 CCMD called.

## 2024-09-01 NOTE — Discharge Instructions (Signed)
 You were seen in the Emergency Department for injuries after fall You dislocated both shoulders which we were able to relocate We placed you in a sling which will need to stay in place until you are cleared by orthopedics You will need to follow-up with Dr.Rogers who is an orthopedic specialist in the office within the next week for reevaluation

## 2024-09-01 NOTE — Progress Notes (Signed)
 Orthopedic Tech Progress Note Patient Details:  Tanya Bell 1941-07-21 [id number]  Ortho Devices Type of Ortho Device: Arm sling Ortho Device/Splint Location: bi-lateral Ortho Device/Splint Interventions: Ordered, Application, Adjustment  I applied arm sling post reduction with assist from dr. Lucia Interventions Patient Tolerated: Well Instructions Provided: Care of device, Adjustment of device  [name] PARAS 09/01/2024, 9:53 PM

## 2024-09-02 ENCOUNTER — Encounter (HOSPITAL_COMMUNITY): Payer: Self-pay

## 2024-09-02 ENCOUNTER — Other Ambulatory Visit: Payer: Self-pay

## 2024-09-02 LAB — CBG MONITORING, ED
Glucose-Capillary: 154 mg/dL — ABNORMAL HIGH (ref 70–99)
Glucose-Capillary: 161 mg/dL — ABNORMAL HIGH (ref 70–99)
Glucose-Capillary: 143 mg/dL — ABNORMAL HIGH (ref 70–99)

## 2024-09-02 MED ORDER — OXYCODONE HCL 5 MG PO TABS
5.0000 mg | ORAL_TABLET | Freq: Once | ORAL | Status: AC
  Administered 2024-09-02: 5 mg via ORAL
  Filled 2024-09-02: qty 1

## 2024-09-02 NOTE — Progress Notes (Signed)
 Physical Therapy Treatment Patient Details Name: Tanya Bell MRN: [id number] DOB: 1941/12/12 Today's Date: 09/02/2024   History of Present Illness Pt is an 83 y/o F presenting to ED on 9/20 after fall resulting in bil shoulder dislocations, both reduced in the ED.     PMH includes DM2, HTN    PT Comments  Continuing work on functional mobility and activity tolerance;  session focused on progressive amb and trying the bil platfrom RW; very slow gait with bil platform RW, but steady; walked into bathroom; Assisted pt with hygiene and pericare, and we discussed that if she is to maintain constant sling use, managing hygiene post BM will be impossible without help from her husband/family; She is worried about managing safely at home; Updating PT dc recs to SNF for post-acute rehab;  Still, pt voices concern about supervision for her husband if she is to go to SNF; Will continue to work on independence with functional mobility, as we recognize it is her decision   If plan is discharge home, recommend the following: A little help with walking and/or transfers;A lot of help with bathing/dressing/bathroom;Assistance with cooking/housework;Assist for transportation;Help with stairs or ramp for entrance   Can travel by private vehicle     Yes (with a lot of assist, and careful positioning)  Equipment Recommendations  Other (comment) (Bil platform RW, tub transfer bench (if OT recommends); consider medical van transport home)    Recommendations for Other Services       Precautions / Restrictions Precautions Precautions: Fall;Other (comment) Recall of Precautions/Restrictions: Intact Precaution/Restrictions Comments: Bil shoulder dislocations, reduced in ED Required Braces or Orthoses: Sling;Splint/Cast Splint/Cast: RUE splint ordered 9/21 Restrictions Other Position/Activity Restrictions: WBAT BUE in slings per MD 9/21     Mobility  Bed Mobility Overal bed mobility: Needs  Assistance Bed Mobility: Supine to Sit, Sit to Supine     Supine to sit: Min assist Sit to supine: Min assist   General bed mobility comments: Cues for technique and heavy mod assist to elevat trunk to sit and guide hips to EOB; Mod assist to help LEs back into bed    Transfers Overall transfer level: Needs assistance Equipment used: Rolling walker (2 wheels) (Bilateral platform) Transfers: Sit to/from Stand Sit to Stand: Min assist           General transfer comment: Cues for platforms use, and control of stand to sit    Ambulation/Gait Ambulation/Gait assistance: Min assist, Mod assist Gait Distance (Feet): 60 Feet (to/from bathroom in hallway) Assistive device: Rolling walker (2 wheels) (bil platfrom RW) Gait Pattern/deviations: Decreased step length - right, Decreased step length - left, Narrow base of support Gait velocity: slow     General Gait Details: Steadier with bil platforms; noting narrow step width and occasional scissoring with turns   Optometrist     Tilt Bed    Modified Rankin (Stroke Patients Only)       Balance Overall balance assessment: Needs assistance   Sitting balance-Leahy Scale: Good       Standing balance-Leahy Scale: Fair                              Hotel manager: No apparent difficulties  Cognition Arousal: Alert Behavior During Therapy: WFL for tasks assessed/performed   PT - Cognitive impairments: No apparent impairments  Following commands: Intact      Cueing Cueing Techniques: Verbal cues, Gestural cues  Exercises      General Comments General comments (skin integrity, edema, etc.): Pt's sister-in-law and brother-in-law present this session; Pt used her L hand for hygeine and pericare after using the toilet; this PT gave pt Max assist to clean backside      Pertinent Vitals/Pain Pain Assessment Pain  Assessment: Faces Pain Score: 7  Faces Pain Scale: Hurts whole lot Pain Location: R > L shoulder, R knee pain Pain Descriptors / Indicators: Grimacing, Guarding, Sore Pain Intervention(s): Monitored during session, Limited activity within patient's tolerance, Patient requesting pain meds-RN notified    Home Living                          Prior Function            PT Goals (current goals can now be found in the care plan section) Acute Rehab PT Goals Patient Stated Goal: Be able to be at home PT Goal Formulation: With patient Time For Goal Achievement: 09/16/24 Potential to Achieve Goals: Fair Progress towards PT goals: Progressing toward goals    Frequency    Min 5X/week      PT Plan      Co-evaluation              AM-PAC PT 6 Clicks Mobility   Outcome Measure  Help needed turning from your back to your side while in a flat bed without using bedrails?: A Lot Help needed moving from lying on your back to sitting on the side of a flat bed without using bedrails?: A Little Help needed moving to and from a bed to a chair (including a wheelchair)?: A Little Help needed standing up from a chair using your arms (e.g., wheelchair or bedside chair)?: A Little Help needed to walk in hospital room?: A Lot Help needed climbing 3-5 steps with a railing? : A Lot 6 Click Score: 15    End of Session Equipment Utilized During Treatment: Gait belt Activity Tolerance: Patient tolerated treatment well Patient left: in bed;with call bell/phone within reach;with family/visitor present Nurse Communication: Mobility status PT Visit Diagnosis: Unsteadiness on feet (R26.81);Other abnormalities of gait and mobility (R26.89);Pain;Muscle weakness (generalized) (M62.81) Pain - Right/Left: Right (knee) Pain - part of body: Shoulder (Bilateral)     Time: 8579-8496 PT Time Calculation (min) (ACUTE ONLY): 43 min  Charges:    $Gait Training: 23-37 mins $Therapeutic  Activity: 8-22 mins PT General Charges $$ ACUTE PT VISIT: 1 Visit                     Silvano Currier, PT  Acute Rehabilitation Services Office [phone] Secure Chat welcomed    Silvano VEAR Currier 09/02/2024, 5:38 PM

## 2024-09-02 NOTE — ED Notes (Signed)
 Assisting pt with request to charge phone

## 2024-09-02 NOTE — Care Management (Addendum)
 Transition of Care [facility]) - Emergency Department Mini Assessment   Patient Details  Name: Tanya Bell MRN: [id number] Date of Birth: 1941/10/20  Transition of Care [facility]) CM/SW Contact:    Corean JAYSON Canary, RN Phone Number: 09/02/2024, 10:01 AM   Clinical Narrative:  Patient presented after a fall, she had a dislocated shoulder that was reduced into place.  She has a arm sling. 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 CSW discussed ANF with patient awaiting PT  and OT evaluation. No services listed in PING Did call [facility] and they accepted for PT OT aide. The patient is taking to her family to assist in decision making.   1130 Discussion with OT PT RN Provider. The plan is to get the patient a platform walker with bilateral platforms  from [employer] to be delivered to hospital, and set up with home health, the patient will board tonight to work with PT and OT again tomorrow, plan is for discharge tomorrow after working with PT OT ED Mini Assessment: What brought you to the Emergency Department? : Fall  Barriers to Discharge:  (Disposition)  Barrier interventions: Discussed HOme health and SNF     Interventions which prevented an admission or readmission: Home Health Consult or Services    Patient Contact and Communications        ,                 Admission diagnosis:  Fall Patient Active Problem List   Diagnosis Date Noted   Gastroesophageal reflux disease without esophagitis 01/16/2024   Type 2 diabetes mellitus with diabetic polyneuropathy, without long-term current use of insulin (HCC) 01/16/2024   Slow transit constipation 01/16/2024   Cystoid macular edema of right eye 08/12/2022   Epiretinal membrane (ERM) of left eye 08/12/2022   History of retinal detachment 08/12/2022   Type 2 diabetes mellitus without retinopathy (HCC) 08/12/2022   Diabetes mellitus type 2, controlled (HCC) 02/04/2015   Varicose veins of leg with edema 02/04/2015    Essential hypertension 05/07/2014   HTN (hypertension) 03/06/2013   Vaginal dryness, menopausal 03/06/2013   Eczema 03/06/2013   Annual physical exam 03/06/2013   Herpes simplex with other ophthalmic complications 01/04/2013   Disorder of bone and cartilage 01/04/2013   Reflux esophagitis 01/04/2013   Other cataract 01/04/2013   Sickle cell anemia (HCC) 01/04/2013   Thrombocytopenia (HCC) 01/04/2013   Other dystrophy of vulva 01/04/2013   Diverticulitis of colon (without mention of hemorrhage)(562.11) 01/04/2013   Disorder of skin or subcutaneous tissue 01/04/2013   Type II or unspecified type diabetes mellitus without mention of complication, not stated as uncontrolled 01/04/2013   Hyperlipidemia LDL goal <100 01/04/2013   Seasonal allergic rhinitis due to pollen 01/04/2013   Extrinsic asthma, unspecified asthma severity, uncomplicated 01/04/2013   Other malaise and fatigue 01/04/2013   Other abnormal glucose 01/04/2013   PCP:  Ngetich, Roxan JAYSON, NP Pharmacy:   [facility] - [place], [place] - [address] [place] [address] Phone: [phone] Fax: [phone]  [id number] - [place], [place] - [address] AT [facility] [address] [place] [address] Phone: [phone] Fax: [phone]

## 2024-09-02 NOTE — Evaluation (Signed)
 Occupational Therapy Evaluation Patient Details Name: Tanya Bell MRN: [id number] DOB: 04-30-1941 Today's Date: 09/02/2024   History of Present Illness   Pt is an 83 y/o F presenting to ED on 9/20 after fall resulting in bil shoulder dislocations, both reduced in the ED.     PMH includes DM2, HTN     Clinical Impressions Pt ind at baseline with ADLs and functional mobility, lives with spouse who has dementia and she cares for him. Pt currently needs up to max A for ADLs, min A for bed mobility and min A for transfers with 1 person HHA. Pt with BUE limitations, per MD WBAT in BUE in slings. Pt with sensation and motor deficits in dominant RUE, no active hand or wrist movement noted, splint ordered. Pt educated on sling wear, precautions, and sling positioning handout provided. Pt presenting with impairments listed below, will follow acutely. Recommend HHOT and HH aide at d/c, with transition to OP OT once cleared by orthopedics.     If plan is discharge home, recommend the following:   A little help with walking and/or transfers;A lot of help with bathing/dressing/bathroom;Assistance with cooking/housework;Direct supervision/assist for financial management;Direct supervision/assist for medications management;Assist for transportation;Help with stairs or ramp for entrance     Functional Status Assessment   Patient has had a recent decline in their functional status and demonstrates the ability to make significant improvements in function in a reasonable and predictable amount of time.     Equipment Recommendations   Tub/shower bench;Other (comment) (bil platform RW)     Recommendations for Other Services   PT consult     Precautions/Restrictions   Precautions Precautions: Fall;Other (comment) Recall of Precautions/Restrictions: Intact Precaution/Restrictions Comments: Bil shoulder dislocations, reduced in ED Required Braces or Orthoses: Sling;Splint/Cast  (bilateral) Splint/Cast: RUE splint ordered 9/21 Restrictions Other Position/Activity Restrictions: WBAT BUE in slings per MD 9/21     Mobility Bed Mobility Overal bed mobility: Needs Assistance Bed Mobility: Supine to Sit, Sit to Supine     Supine to sit: Min assist Sit to supine: Min assist        Transfers Overall transfer level: Needs assistance Equipment used: 1 person hand held assist Transfers: Sit to/from Stand Sit to Stand: Min assist           General transfer comment: ambulates to bathroom      Balance Overall balance assessment: Needs assistance   Sitting balance-Leahy Scale: Good       Standing balance-Leahy Scale: Fair                             ADL either performed or assessed with clinical judgement   ADL Overall ADL's : Needs assistance/impaired Eating/Feeding: Moderate assistance   Grooming: Moderate assistance   Upper Body Bathing: Maximal assistance   Lower Body Bathing: Moderate assistance   Upper Body Dressing : Maximal assistance   Lower Body Dressing: Moderate assistance   Toilet Transfer: Minimal assistance;Ambulation   Toileting- Clothing Manipulation and Hygiene: Moderate assistance       Functional mobility during ADLs: Moderate assistance       Vision   Vision Assessment?: No apparent visual deficits     Perception Perception: Not tested       Praxis Praxis: Not tested       Pertinent Vitals/Pain Pain Assessment Pain Assessment: Faces Pain Score: 7  Faces Pain Scale: Hurts whole lot Pain Location: R > L shoulder, R knee  pain Pain Descriptors / Indicators: Grimacing, Guarding, Sore Pain Intervention(s): Limited activity within patient's tolerance, Monitored during session, Repositioned     Extremity/Trunk Assessment Upper Extremity Assessment Upper Extremity Assessment: Right hand dominant;RUE deficits/detail;LUE deficits/detail RUE Deficits / Details: no AROM in hand or wrist, noted  wrist drop, suspect radial and some median nerve involvement LUE Deficits / Details: WNL for hand/wrist/elbow LUE Sensation: decreased light touch (mild sensation deficits) LUE Coordination: WNL   Lower Extremity Assessment Lower Extremity Assessment: Defer to PT evaluation RLE Deficits / Details: R knee with pain during gait trials, especially with weight bearing   Cervical / Trunk Assessment Cervical / Trunk Assessment: Normal   Communication Communication Communication: No apparent difficulties   Cognition Arousal: Alert Behavior During Therapy: WFL for tasks assessed/performed Cognition: No apparent impairments                               Following commands: Intact       Cueing  General Comments   Cueing Techniques: Verbal cues;Gestural cues  VSS   Exercises     Shoulder Instructions      Home Living Family/patient expects to be discharged to:: Private residence Living Arrangements: Spouse/significant other Available Help at Discharge: Family;Other (Comment) Type of Home: House Home Access: Stairs to enter [employer] of Steps: 2 Entrance Stairs-Rails: None Home Layout: One level     Bathroom Shower/Tub: Chief Strategy Officer: Standard Bathroom Accessibility: Yes   Home Equipment: Grab bars - toilet;BSC/3in1   Additional Comments: Pt is very hesitant to to to SNF for post-acute rehab; she wants to be home for her husband      Prior Functioning/Environment Prior Level of Function : Independent/Modified Independent             Mobility Comments: Independent ADLs Comments: Independent; husband drives (sounds like she navigates)    OT Problem List: Decreased strength;Decreased range of motion;Decreased activity tolerance;Impaired balance (sitting and/or standing);Decreased knowledge of precautions;Impaired UE functional use;Impaired sensation   OT Treatment/Interventions: Self-care/ADL training;Therapeutic  exercise;Energy conservation;DME and/or AE instruction;Therapeutic activities;Patient/family education;Balance training      OT Goals(Current goals can be found in the care plan section)   Acute Rehab OT Goals Patient Stated Goal: none stated OT Goal Formulation: With patient Time For Goal Achievement: 09/16/24 Potential to Achieve Goals: Good   OT Frequency:  Min 2X/week    Co-evaluation              AM-PAC OT 6 Clicks Daily Activity     Outcome Measure Help from another person eating meals?: A Little Help from another person taking care of personal grooming?: A Little Help from another person toileting, which includes using toliet, bedpan, or urinal?: A Lot Help from another person bathing (including washing, rinsing, drying)?: A Lot Help from another person to put on and taking off regular upper body clothing?: A Lot Help from another person to put on and taking off regular lower body clothing?: A Lot 6 Click Score: 14   End of Session Equipment Utilized During Treatment: Gait belt;Other (comment) (BUE slings) Nurse Communication: Mobility status  Activity Tolerance: Patient tolerated treatment well Patient left: in bed;with call bell/phone within reach;with family/visitor present  OT Visit Diagnosis: Unsteadiness on feet (R26.81);Other abnormalities of gait and mobility (R26.89);Muscle weakness (generalized) (M62.81)                Time: 8879-8798 OT Time Calculation (min): 41  min Charges:  OT General Charges $OT Visit: 1 Visit OT Evaluation $OT Eval Moderate Complexity: 1 Mod OT Treatments $Self Care/Home Management : 8-22 mins $Therapeutic Activity: 8-22 mins  Lashae Wollenberg K, OTD, OTR/L SecureChat Preferred Acute Rehab [phone]   Laneta POUR Koonce 09/02/2024, 1:08 PM

## 2024-09-02 NOTE — ED Notes (Signed)
 Messaged provider about DC plan. Pt daughters phone numbers as follows [phone] Danette and [phone] Grayce

## 2024-09-02 NOTE — Evaluation (Signed)
 Physical Therapy Evaluation Patient Details Name: Tanya Bell MRN: [id number] DOB: [birth year] Today's Date: 09/02/2024  History of Present Illness  Admitted after a fall resulting in Bil shoulder dislocations, reduced in ED; R hand with apparent nerve injury, R knee with pain with weight bearing;  has a past medical history  Disorder of bone and cartilage, unspecified, Diverticulitis of colon (without mention of hemorrhage)(562.11), Essential hypertension, benign, Essential hypertension, benign, Extrinsic asthma, unspecified, Herpes simplex with other ophthalmic complications, Other cataract, Type II or unspecified type diabetes mellitus without mention of complication, not stated as uncontrolled, Type II or unspecified type diabetes mellitus without mention of complication  Clinical Impression   Pt admitted with above diagnosis. Lives at home with her husband, in a single-level home with 2 steps to enter; Prior to admission, pt was completely independent, no need for assistive device with amb; Husband doing driving, but noting that pt does A LOT for him (this very much informs her decision to get home with [facility] PT/OT, and other home services -- she is worried about how he will manage without her, and family assist is unavailable); Presents to PT with functional dependencies, Motion restriction Bil UEs (remain in sling until [facility] outpt follow up), pain in shoulders and R knee with movement; Quite a decline in functional mobility compared to baseline; Needs mod assist to come to sitting EOB, to stand, and to walk short distance in room; For home, recommend maximizing HH services, and a bil platform RW, tub transfer bench; We talked about considerations for meals (husband can't cook safely); Pt currently with functional limitations due to the deficits listed below (see PT Problem List). Pt will benefit from skilled PT to increase their independence and safety with mobility to allow discharge to the  venue listed below.    Appreciate OT's input into pt's dispo -- with bil UEs effected, anticipate ADLs will be extremely challenging; in the absence of post-acute rehab as an option, request at least one more night here acutely for more PT/OT training to maximize independence and safety with mobility and ADLs prior to getting home.         If plan is discharge home, recommend the following: A little help with walking and/or transfers;A lot of help with bathing/dressing/bathroom;Assistance with cooking/housework;Assist for transportation;Help with stairs or ramp for entrance   Can travel by private vehicle        Equipment Recommendations Other (comment) (Bil platform RW, tub transfer bench (if Ortho recommends); consider medical van transport home)  Recommendations for Other Services  OT consult (ordered per protocol)    Functional Status Assessment Patient has had a recent decline in their functional status and demonstrates the ability to make significant improvements in function in a reasonable and predictable amount of time.     Precautions / Restrictions Precautions Precautions: Fall;Other (comment) Recall of Precautions/Restrictions: Intact Precaution/Restrictions Comments: Bil shoulder dislocations, reduced in ED Required Braces or Orthoses: Sling (bilateral) Restrictions Weight Bearing Restrictions Per Provider Order:  (As of PT eval, no weight bearing or motion restrictions noted in the chart)      Mobility  Bed Mobility Overal bed mobility: Needs Assistance Bed Mobility: Supine to Sit, Sit to Supine     Supine to sit: Mod assist Sit to supine: Mod assist   General bed mobility comments: Cues for technique and heavy mod assist to elevat trunk to sit and guide hips to EOB; Mod assist to help LEs back into bed    Transfers Overall transfer  level: Needs assistance Equipment used: 1 person hand held assist Transfers: Sit to/from Stand Sit to Stand: Mod assist, Min  assist           General transfer comment: Stood x3 from high surface of stretcher; Light mod assist to steady first rep; Next 2 times, provided bil UE support at elbows (shelf-arm technqiue)    Ambulation/Gait Ambulation/Gait assistance: Mod assist Gait Distance (Feet): 4 Feet (and forward and backward walking) Assistive device: 1 person hand held assist (standing in front of pt, bil Ues support) Gait Pattern/deviations: Decreased step length - right, Decreased step length - left, Decreased stance time - right Gait velocity: slow     General Gait Details: Unsteady gait with notable painful R knee in stance; dependent on bil UE support; tolerating VERY short distance  Stairs            Wheelchair Mobility     Tilt Bed    Modified Rankin (Stroke Patients Only)       Balance Overall balance assessment: Needs assistance   Sitting balance-Leahy Scale: Fair       Standing balance-Leahy Scale: Poor                               Pertinent Vitals/Pain Pain Assessment Pain Assessment: 0-10 Pain Score: 7  Pain Location: R shoulder more painful than L (but L is still painful); noteworthy R knee pain with Pain Descriptors / Indicators: Grimacing, Guarding, Sore Pain Intervention(s): Monitored during session    Home Living Family/patient expects to be discharged to:: Private residence Living Arrangements: Spouse/significant other (Spouse has medical problems and walks with a RW; Pt provides supervision for him; spouse tends to do the driving) Available Help at Discharge: Family;Other (Comment) (Spouse can give limited assist)   Home Access: Stairs to enter Entrance Stairs-Rails: None Entrance Stairs-Number of Steps: 2   Home Layout: One level Home Equipment: Grab bars - toilet;BSC/3in1 Additional Comments: Pt is very hesitant to to to SNF for post-acute rehab; she wants to be home for her husband    Prior Function Prior Level of Function :  Independent/Modified Independent             Mobility Comments: Independent ADLs Comments: Independent; husband drives (sounds like she navigates)     Extremity/Trunk Assessment   Upper Extremity Assessment Upper Extremity Assessment: Defer to OT evaluation;Right hand dominant (Painful to move both shoulders, R worse than L; bil slings; incr time to get slings on correctly; notable less active movement R hand; placed a roll for R hand aupport)    Lower Extremity Assessment Lower Extremity Assessment: Generalized weakness;RLE deficits/detail RLE Deficits / Details: R knee with pain during gait trials, especially with weight bearing       Communication   Communication Communication: No apparent difficulties    Cognition Arousal: Alert Behavior During Therapy: WFL for tasks assessed/performed   PT - Cognitive impairments: No apparent impairments                         Following commands: Intact       Cueing Cueing Techniques: Verbal cues, Gestural cues     General Comments General comments (skin integrity, edema, etc.): Incr time to discuss rehab options, DC considerations; Daughters were on the phone a part of the time    Exercises     Assessment/Plan    PT Assessment Patient needs continued  PT services  PT Problem List Decreased strength;Decreased range of motion;Decreased activity tolerance;Decreased balance;Decreased mobility;Decreased coordination;Decreased knowledge of use of DME;Decreased safety awareness;Decreased knowledge of precautions;Pain       PT Treatment Interventions DME instruction;Gait training;Stair training;Functional mobility training;Therapeutic activities;Therapeutic exercise;Balance training;Neuromuscular re-education;Patient/family education;Manual techniques    PT Goals (Current goals can be found in the Care Plan section)  Acute Rehab PT Goals Patient Stated Goal: Be able to be at home PT Goal Formulation: With  patient Time For Goal Achievement: 09/16/24 Potential to Achieve Goals: Fair    Frequency Min 5X/week     Co-evaluation               AM-PAC PT 6 Clicks Mobility  Outcome Measure Help needed turning from your back to your side while in a flat bed without using bedrails?: A Lot Help needed moving from lying on your back to sitting on the side of a flat bed without using bedrails?: A Lot Help needed moving to and from a bed to a chair (including a wheelchair)?: A Lot Help needed standing up from a chair using your arms (e.g., wheelchair or bedside chair)?: A Lot Help needed to walk in hospital room?: A Lot Help needed climbing 3-5 steps with a railing? : A Lot 6 Click Score: 12    End of Session Equipment Utilized During Treatment: Gait belt Activity Tolerance: Patient limited by pain Patient left: in bed;with call bell/phone within reach;with family/visitor present Nurse Communication: Mobility status PT Visit Diagnosis: Unsteadiness on feet (R26.81);Other abnormalities of gait and mobility (R26.89);Pain;Muscle weakness (generalized) (M62.81) Pain - Right/Left:  (Bilateral shoulders and R knee) Pain - part of body: Shoulder;Knee    Time: 0921-1022 PT Time Calculation (min) (ACUTE ONLY): 61 min   Charges:   PT Evaluation $PT Eval Moderate Complexity: 1 Mod PT Treatments $Gait Training: 8-22 mins $Therapeutic Activity: 23-37 mins PT General Charges $$ ACUTE PT VISIT: 1 Visit         Silvano Currier, PT  Acute Rehabilitation Services Office [phone] Secure Chat welcomed   Silvano VEAR Currier 09/02/2024, 12:27 PM

## 2024-09-02 NOTE — Progress Notes (Signed)
 Orthopedic Tech Progress Note Patient Details:  KATARINA RIEBE 1941/09/27 [id number]  Patient ID: [name], female   DOB: 1941/05/27, 83 y.o.   MRN: [id number] Attempted to apply wrist brace, but at this time there is still an IV in place on the patients right hand, so the brace cannot be applied. Brace left at bedside and RN notified of IV. [place] A Wonda 09/02/2024, 3:10 PM

## 2024-09-02 NOTE — ED Notes (Signed)
 Messaged ortho tech for brace application

## 2024-09-02 NOTE — ED Provider Notes (Signed)
 Emergency Medicine Observation Re-evaluation Note  Tanya Bell is a 83 y.o. female, seen on rounds today.  Pt initially presented to the ED for complaints of Fall Currently, the patient is resting comfortably in ED bed.  Physical Exam  BP (!) 134/52   Pulse 69   Temp 98.3 F (36.8 C)   Resp 18   Ht 5' 1 (1.549 m)   Wt 68.5 kg   SpO2 91%   BMI 28.53 kg/m  Physical Exam General: Awake. Alert. No acute distress Cardiac: Regular rate rhythm Lungs: Clear to auscultation bilaterally Psych: Calm and cooperative Musculoskeletal: Continued weakness throughout right hand right wrist  ED Course / MDM  EKG:EKG Interpretation Date/Time:  Saturday September 01 2024 17:39:17 EDT Ventricular Rate:  77 PR Interval:  234 QRS Duration:  75 QT Interval:  374 QTC Calculation: 424 R Axis:   86  Text Interpretation: Sinus rhythm Prolonged PR interval Borderline right axis deviation Abnormal T, consider ischemia, diffuse leads Confirmed by Pamella Sharper [phone]) on 09/01/2024 10:40:44 PM  I have reviewed the labs performed to date as well as medications administered while in observation.  Recent changes in the last 24 hours include PT OT evaluation.  Plan  Current plan is for continued observation in the ED through today and overnight with likely discharge tomorrow.    Based on the patient's recovery from bilateral anterior shoulder dislocations and significant right hand right wrist weakness following this injury due to likely nerve palsy, she would certainly qualify for rehab/skilled nursing facility placement however the patient and her daughters do not want her placed and want her to go home with home health services in place.  Our physical therapy and Occupational Therapy team would like to keep her in the ED to practice walking with a platform walker and ensure she can perform activities of daily living.  This does sound like a safe plan especially with her and her family being  resistant to rehab and SNF placement.  We will also add a right wrist splint given her persistent nerve palsy.    Pamella Sharper LABOR, DO 09/02/24 2118

## 2024-09-02 NOTE — ED Notes (Signed)
 Pt has mild oxygen requirement when sleeping after pain medication admin. Pt resting comfortably

## 2024-09-02 NOTE — ED Notes (Signed)
 Ortho tech called for bedside assistance on placing wrist brace on pts Right arm

## 2024-09-02 NOTE — ED Notes (Signed)
 Changed pt linens and bedding. Placed pt in brief per request. Pt was amb with walker and PT assistance to RR. Family of pt husband at bedside

## 2024-09-02 NOTE — ED Notes (Signed)
 OT at bedside to eval pt, pt family present on face time. Concern and plan for discharge to SNF for rehab vs home health discussions taken place. SW plan to call pt back after OT eval

## 2024-09-02 NOTE — Progress Notes (Addendum)
 PT eval pending. IP Care Management to follow.    Adden 9:00am CSW spoke with the pt regarding short-term rehab. Pt reports that she is from home and lives with her husband. Pt reports she would like to get rehab at home. She also stated that she does not want to leave her husband. She is his caregiver and would only need someone to heat up food occasionally. She inquired about Meals on Wheels. CSW explained that contact information can be attached to the pt's AVS and informed her that there may be a waitlist. CSW suggested the pt call her children to inquire if they can provide any assistance at home when she returns. Pt reports she will need transportation assistance home. CSW requested assistance from the [facility] to help arrange home health services.  10:40pm  CSW spoke with the pt's daughter, Adela, who expressed concerns about the pt returning home and inquired if the pt could stay in the hospital. She mentioned that the pt's husband is also in the hospital. She inquired if the pt's husband would get placement as well. CSW explained that the pt qualifies for short term rehab, but her husband does not. CSW clarified that the pt has declined rehab at an SNF and prefers to return home with home health services, which have been arranged with [facility]. pt's daughter mentioned that she and her siblings will be meeting to develop a plan to assist the pt and her husband at home. CSW asked if she would like assistance with a referral for aide services. Pt's daughter stated that her family would take care of it. She also requested a taxi for the pt and her husband to be discharged home.   Tawni HERO.Diante Barley, MSW, LCSW [facility] Management  Clinical Social Worker  Direct Dial: [phone]  Fax: [phone] [email or web address]

## 2024-09-03 ENCOUNTER — Emergency Department (HOSPITAL_COMMUNITY)

## 2024-09-03 ENCOUNTER — Encounter (HOSPITAL_COMMUNITY): Payer: Self-pay

## 2024-09-03 DIAGNOSIS — J45909 Unspecified asthma, uncomplicated: Secondary | ICD-10-CM | POA: Diagnosis present

## 2024-09-03 DIAGNOSIS — E1142 Type 2 diabetes mellitus with diabetic polyneuropathy: Secondary | ICD-10-CM | POA: Diagnosis not present

## 2024-09-03 DIAGNOSIS — Z90710 Acquired absence of both cervix and uterus: Secondary | ICD-10-CM | POA: Diagnosis not present

## 2024-09-03 DIAGNOSIS — N179 Acute kidney failure, unspecified: Secondary | ICD-10-CM | POA: Diagnosis present

## 2024-09-03 DIAGNOSIS — E785 Hyperlipidemia, unspecified: Secondary | ICD-10-CM

## 2024-09-03 DIAGNOSIS — Z8669 Personal history of other diseases of the nervous system and sense organs: Secondary | ICD-10-CM

## 2024-09-03 DIAGNOSIS — H409 Unspecified glaucoma: Secondary | ICD-10-CM | POA: Diagnosis present

## 2024-09-03 DIAGNOSIS — Z888 Allergy status to other drugs, medicaments and biological substances status: Secondary | ICD-10-CM | POA: Diagnosis not present

## 2024-09-03 DIAGNOSIS — Z87891 Personal history of nicotine dependence: Secondary | ICD-10-CM | POA: Diagnosis not present

## 2024-09-03 DIAGNOSIS — K219 Gastro-esophageal reflux disease without esophagitis: Secondary | ICD-10-CM

## 2024-09-03 DIAGNOSIS — I10 Essential (primary) hypertension: Secondary | ICD-10-CM | POA: Diagnosis present

## 2024-09-03 DIAGNOSIS — S43015A Anterior dislocation of left humerus, initial encounter: Secondary | ICD-10-CM | POA: Diagnosis present

## 2024-09-03 DIAGNOSIS — W19XXXA Unspecified fall, initial encounter: Secondary | ICD-10-CM | POA: Diagnosis present

## 2024-09-03 DIAGNOSIS — R0902 Hypoxemia: Secondary | ICD-10-CM | POA: Diagnosis present

## 2024-09-03 DIAGNOSIS — S43014A Anterior dislocation of right humerus, initial encounter: Secondary | ICD-10-CM | POA: Diagnosis present

## 2024-09-03 DIAGNOSIS — Z79899 Other long term (current) drug therapy: Secondary | ICD-10-CM | POA: Diagnosis not present

## 2024-09-03 DIAGNOSIS — G8321 Monoplegia of upper limb affecting right dominant side: Secondary | ICD-10-CM | POA: Diagnosis present

## 2024-09-03 DIAGNOSIS — Z91040 Latex allergy status: Secondary | ICD-10-CM | POA: Diagnosis not present

## 2024-09-03 DIAGNOSIS — Z882 Allergy status to sulfonamides status: Secondary | ICD-10-CM | POA: Diagnosis not present

## 2024-09-03 DIAGNOSIS — E1141 Type 2 diabetes mellitus with diabetic mononeuropathy: Secondary | ICD-10-CM | POA: Diagnosis present

## 2024-09-03 DIAGNOSIS — S43006A Unspecified dislocation of unspecified shoulder joint, initial encounter: Secondary | ICD-10-CM | POA: Diagnosis not present

## 2024-09-03 DIAGNOSIS — S143XXA Injury of brachial plexus, initial encounter: Secondary | ICD-10-CM | POA: Diagnosis present

## 2024-09-03 DIAGNOSIS — Y92512 Supermarket, store or market as the place of occurrence of the external cause: Secondary | ICD-10-CM | POA: Diagnosis not present

## 2024-09-03 DIAGNOSIS — J9811 Atelectasis: Secondary | ICD-10-CM | POA: Diagnosis present

## 2024-09-03 DIAGNOSIS — E860 Dehydration: Secondary | ICD-10-CM | POA: Diagnosis present

## 2024-09-03 LAB — CBC WITH DIFFERENTIAL/PLATELET
Abs Immature Granulocytes: 0.05 K/uL (ref 0.00–0.07)
Basophils Absolute: 0.1 K/uL (ref 0.0–0.1)
Basophils Relative: 1 %
Eosinophils Absolute: 0.1 K/uL (ref 0.0–0.5)
Eosinophils Relative: 1 %
HCT: 36.1 % (ref 36.0–46.0)
Hemoglobin: 12.5 g/dL (ref 12.0–15.0)
Immature Granulocytes: 1 %
Lymphocytes Relative: 17 %
Lymphs Abs: 1.8 K/uL (ref 0.7–4.0)
MCH: 25.9 pg — ABNORMAL LOW (ref 26.0–34.0)
MCHC: 34.6 g/dL (ref 30.0–36.0)
MCV: 74.7 fL — ABNORMAL LOW (ref 80.0–100.0)
Monocytes Absolute: 1.4 K/uL — ABNORMAL HIGH (ref 0.1–1.0)
Monocytes Relative: 13 %
Neutro Abs: 7 K/uL (ref 1.7–7.7)
Neutrophils Relative %: 67 %
Platelets: 146 K/uL — ABNORMAL LOW (ref 150–400)
RBC: 4.83 MIL/uL (ref 3.87–5.11)
RDW: 16 % — ABNORMAL HIGH (ref 11.5–15.5)
WBC: 10.4 K/uL (ref 4.0–10.5)
nRBC: 0 % (ref 0.0–0.2)

## 2024-09-03 LAB — I-STAT CHEM 8, ED
BUN: 43 mg/dL — ABNORMAL HIGH (ref 8–23)
Calcium, Ion: 1.08 mmol/L — ABNORMAL LOW (ref 1.15–1.40)
Chloride: 102 mmol/L (ref 98–111)
Creatinine, Ser: 1.6 mg/dL — ABNORMAL HIGH (ref 0.44–1.00)
Glucose, Bld: 143 mg/dL — ABNORMAL HIGH (ref 70–99)
HCT: 38 % (ref 36.0–46.0)
Hemoglobin: 12.9 g/dL (ref 12.0–15.0)
Potassium: 4.1 mmol/L (ref 3.5–5.1)
Sodium: 135 mmol/L (ref 135–145)
TCO2: 26 mmol/L (ref 22–32)

## 2024-09-03 LAB — COMPREHENSIVE METABOLIC PANEL WITH GFR
ALT: 15 U/L (ref 0–44)
AST: 16 U/L (ref 15–41)
Albumin: 2.9 g/dL — ABNORMAL LOW (ref 3.5–5.0)
Alkaline Phosphatase: 49 U/L (ref 38–126)
Anion gap: 10 (ref 5–15)
BUN: 36 mg/dL — ABNORMAL HIGH (ref 8–23)
CO2: 21 mmol/L — ABNORMAL LOW (ref 22–32)
Calcium: 8.5 mg/dL — ABNORMAL LOW (ref 8.9–10.3)
Chloride: 102 mmol/L (ref 98–111)
Creatinine, Ser: 1.52 mg/dL — ABNORMAL HIGH (ref 0.44–1.00)
GFR, Estimated: 34 mL/min — ABNORMAL LOW (ref >60)
Glucose, Bld: 138 mg/dL — ABNORMAL HIGH (ref 70–99)
Potassium: 3.8 mmol/L (ref 3.5–5.1)
Sodium: 133 mmol/L — ABNORMAL LOW (ref 135–145)
Total Bilirubin: 1.1 mg/dL (ref 0.0–1.2)
Total Protein: 7 g/dL (ref 6.5–8.1)

## 2024-09-03 LAB — CBG MONITORING, ED: Glucose-Capillary: 149 mg/dL — ABNORMAL HIGH (ref 70–99)

## 2024-09-03 LAB — TROPONIN I (HIGH SENSITIVITY)
Troponin I (High Sensitivity): 10 ng/L (ref <18)
Troponin I (High Sensitivity): 10 ng/L (ref <18)

## 2024-09-03 LAB — CK: Total CK: 286 U/L — ABNORMAL HIGH (ref 38–234)

## 2024-09-03 LAB — BRAIN NATRIURETIC PEPTIDE: B Natriuretic Peptide: 62.5 pg/mL (ref 0.0–100.0)

## 2024-09-03 MED ORDER — ALBUTEROL SULFATE (2.5 MG/3ML) 0.083% IN NEBU: 2.5000 mg | INHALATION_SOLUTION | Freq: Four times a day (QID) | RESPIRATORY_TRACT | Status: DC | PRN

## 2024-09-03 MED ORDER — IOHEXOL 350 MG/ML SOLN
75.0000 mL | Freq: Once | INTRAVENOUS | Status: AC | PRN
  Administered 2024-09-03: 75 mL via INTRAVENOUS

## 2024-09-03 MED ORDER — ONDANSETRON HCL 4 MG/2ML IJ SOLN: 4.0000 mg | Freq: Four times a day (QID) | INTRAMUSCULAR | Status: DC | PRN

## 2024-09-03 MED ORDER — LACTATED RINGERS IV BOLUS
1000.0000 mL | Freq: Once | INTRAVENOUS | Status: AC
Start: 2024-09-03 — End: 2024-09-03
  Administered 2024-09-03: 1000 mL via INTRAVENOUS

## 2024-09-03 MED ORDER — SODIUM CHLORIDE 0.9 % IV SOLN: INTRAVENOUS | Status: AC

## 2024-09-03 MED ORDER — ACETAMINOPHEN 650 MG RE SUPP: 650.0000 mg | Freq: Four times a day (QID) | RECTAL | Status: DC | PRN

## 2024-09-03 MED ORDER — ACETAMINOPHEN 325 MG PO TABS
650.0000 mg | ORAL_TABLET | Freq: Four times a day (QID) | ORAL | Status: DC | PRN
  Administered 2024-09-03 – 2024-09-11 (×7): 650 mg via ORAL
  Filled 2024-09-03 (×7): qty 2

## 2024-09-03 MED ORDER — LINAGLIPTIN 5 MG PO TABS
5.0000 mg | ORAL_TABLET | Freq: Every day | ORAL | Status: DC
  Administered 2024-09-04 – 2024-09-11 (×8): 5 mg via ORAL
  Filled 2024-09-03 (×8): qty 1

## 2024-09-03 MED ORDER — ENOXAPARIN SODIUM 30 MG/0.3ML IJ SOSY
30.0000 mg | PREFILLED_SYRINGE | INTRAMUSCULAR | Status: DC
  Administered 2024-09-03: 30 mg via SUBCUTANEOUS
  Filled 2024-09-03: qty 0.3

## 2024-09-03 MED ORDER — PREDNISOLONE ACETATE 1 % OP SUSP
1.0000 [drp] | Freq: Four times a day (QID) | OPHTHALMIC | Status: DC
  Administered 2024-09-03 – 2024-09-11 (×30): 1 [drp] via OPHTHALMIC
  Filled 2024-09-03: qty 5

## 2024-09-03 MED ORDER — ONDANSETRON HCL 4 MG PO TABS: 4.0000 mg | ORAL_TABLET | Freq: Four times a day (QID) | ORAL | Status: DC | PRN

## 2024-09-03 MED ORDER — KETOROLAC TROMETHAMINE 0.5 % OP SOLN
1.0000 [drp] | Freq: Four times a day (QID) | OPHTHALMIC | Status: DC
  Administered 2024-09-03 – 2024-09-11 (×30): 1 [drp] via OPHTHALMIC
  Filled 2024-09-03: qty 5

## 2024-09-03 MED ORDER — SODIUM CHLORIDE 0.9% FLUSH
3.0000 mL | Freq: Two times a day (BID) | INTRAVENOUS | Status: DC
  Administered 2024-09-03 – 2024-09-11 (×16): 3 mL via INTRAVENOUS

## 2024-09-03 NOTE — ED Notes (Signed)
 Got patient straighten up in the bed patient has family at bedside and call bell in reach

## 2024-09-03 NOTE — Progress Notes (Addendum)
 Physical Therapy Treatment Patient Details Name: Tanya Bell MRN: [id number] DOB: 13-Aug-1941 Today's Date: 09/03/2024   History of Present Illness Pt is an 83 y/o F presenting to ED on 9/20 after fall resulting in bil shoulder dislocations, both reduced in the ED.     PMH includes DM2, HTN    PT Comments  Pt with updated weightbearing restrictions per Dr. Catha via secure chat overnight, NWB to BUE. Pt currently requires assistance to perform bed mobility due to weightbearing restrictions. Pt is able to ambulate for increased distances and with improved stability this session and will benefit from continued mobilization in an effort to restore independence. Pt will likely require significant assistance with ADL management upon discharge, which her spouse can likely not assist with. Patient will benefit from continued inpatient follow up therapy, <3 hours/day.   If plan is discharge home, recommend the following: A little help with walking and/or transfers;A lot of help with bathing/dressing/bathroom;Assistance with cooking/housework;Assist for transportation;Help with stairs or ramp for entrance   Can travel by private vehicle     Yes  Equipment Recommendations  None recommended by PT    Recommendations for Other Services       Precautions / Restrictions Precautions Precautions: Fall;Other (comment) Recall of Precautions/Restrictions: Intact Precaution/Restrictions Comments: Bil shoulder dislocations, reduced in ED Required Braces or Orthoses: Sling;Splint/Cast Splint/Cast: RUE splint ordered 9/21 Restrictions Weight Bearing Restrictions Per Provider Order: Yes RUE Weight Bearing Per Provider Order: Non weight bearing LUE Weight Bearing Per Provider Order: Non weight bearing Other Position/Activity Restrictions: per secure chat from Dr. Catha on 09/02/2024 at 9:43 PM,     Mobility  Bed Mobility Overal bed mobility: Needs Assistance Bed Mobility: Supine to Sit, Sit to  Supine     Supine to sit: Min assist Sit to supine: Min assist        Transfers Overall transfer level: Needs assistance Equipment used: None Transfers: Sit to/from Stand Sit to Stand: Contact guard assist                Ambulation/Gait Ambulation/Gait assistance: Contact guard assist Gait Distance (Feet): 300 Feet Assistive device: None Gait Pattern/deviations: Step-through pattern, Drifts right/left Gait velocity: reduced Gait velocity interpretation: <1.8 ft/sec, indicate of risk for recurrent falls   General Gait Details: pt with slowed step-through gait, stability appears to improve some with increased ambulation distance through the initial 250' however the pt appears to fatigue near the end of ambulation bout with increased drift   Stairs             Wheelchair Mobility     Tilt Bed    Modified Rankin (Stroke Patients Only)       Balance Overall balance assessment: Needs assistance Sitting-balance support: Feet supported, No upper extremity supported Sitting balance-Leahy Scale: Fair     Standing balance support: No upper extremity supported Standing balance-Leahy Scale: Fair                              Hotel manager: No apparent difficulties  Cognition Arousal: Alert Behavior During Therapy: WFL for tasks assessed/performed   PT - Cognitive impairments: No apparent impairments                         Following commands: Intact      Cueing Cueing Techniques: Verbal cues  Exercises      General Comments General comments (skin integrity,  edema, etc.): pt on 2L [name] upon PT arrival. Pt weaned to room air, sats remain 88-90% on room air at rest and with activity. Pt placed back on 2L [name] at end of session      Pertinent Vitals/Pain Pain Assessment Pain Assessment: Faces Faces Pain Scale: Hurts even more Pain Location: shoulders Pain Descriptors / Indicators: Grimacing Pain  Intervention(s): Monitored during session    Home Living                          Prior Function            PT Goals (current goals can now be found in the care plan section) Acute Rehab PT Goals Patient Stated Goal: Be able to be at home Progress towards PT goals: Progressing toward goals    Frequency    Min 3X/week      PT Plan      Co-evaluation              AM-PAC PT 6 Clicks Mobility   Outcome Measure  Help needed turning from your back to your side while in a flat bed without using bedrails?: A Little Help needed moving from lying on your back to sitting on the side of a flat bed without using bedrails?: A Little Help needed moving to and from a bed to a chair (including a wheelchair)?: A Little Help needed standing up from a chair using your arms (e.g., wheelchair or bedside chair)?: A Little Help needed to walk in hospital room?: A Little Help needed climbing 3-5 steps with a railing? : A Lot 6 Click Score: 17    End of Session Equipment Utilized During Treatment: Gait belt;Oxygen Activity Tolerance: Patient tolerated treatment well Patient left: in bed;with call bell/phone within reach;with family/visitor present Nurse Communication: Mobility status PT Visit Diagnosis: Unsteadiness on feet (R26.81);Other abnormalities of gait and mobility (R26.89);Pain;Muscle weakness (generalized) (M62.81)     Time: 8959-8877 PT Time Calculation (min) (ACUTE ONLY): 42 min  Charges:    $Gait Training: 8-22 mins $Therapeutic Activity: 23-37 mins PT General Charges $$ ACUTE PT VISIT: 1 Visit                     Bernardino JINNY Ruth, PT, DPT Acute Rehabilitation Office [phone]    Bernardino JINNY Ruth 09/03/2024, 12:06 PM

## 2024-09-03 NOTE — TOC Progression Note (Signed)
 Transition of Care [facility]) - Progression Note   Patient Details  Name: Tanya Bell MRN: [id number] Date of Birth: August 09, 1941  Transition of [facility] CM/SW Contact  Nena LITTIE Coffee, RN Phone Number: 09/03/2024, 5:00 PM  Clinical Narrative:   Pt who is the primary care-giver for her husband is to be admitted. Contacted daughter Tanya Bell [phone]) by phone to inquire as to the family's plan to assist c/Mr. Lynwood while pt is in the hospital. Tanya Bell stated she lives in [place]  and is on her way and should arrive this evening. Pt's husband is at the bedside in the ED. He is A&O, ambulatory c/a walker. Pt states her husband needs help c/ADLs and meals.   Social Drivers of Health (SDOH) Interventions SDOH Screenings   Food Insecurity: No Food Insecurity (12/14/2017)  Housing: Unknown (02/16/2024)   Received from [facility]  Transportation Needs: Unmet Transportation Needs (12/14/2017)  Alcohol Screen: Low Risk  (07/26/2018)  Depression (PHQ2-9): Low Risk  (07/18/2024)  Financial Resource Strain: Low Risk  (12/14/2017)  Physical Activity: Inactive (12/14/2017)  Social Connections: Socially Integrated (12/14/2017)  Stress: Stress Concern Present (12/14/2017)  Tobacco Use: Medium Risk (09/03/2024)    Readmission Risk Interventions     No data to display

## 2024-09-03 NOTE — H&P (Addendum)
 History and Physical    Patient: Tanya Bell FMW:[id number] DOB: 24-Sep-1941 DOA: 09/01/2024 DOS: the patient was seen and examined on 09/03/2024 PCP: Leonarda Roxan JAYSON, NP  Patient coming from: EMS  Chief Complaint:  Chief Complaint  Patient presents with   Fall   HPI: Tanya Bell is a 83 y.o. female with medical history significant of hypertension, hyperlipidemia, diabetes mellitus type 2 who presented after having a fall 2 days ago. She is accompanied by her husband, sister, and brother-in-law.  She was admitted to the hospital two days ago after experiencing a fall while shopping. She attempted to stop a rolling cart, which resulted in her being pulled down and hitting her arms, face, and knees. This incident led to the dislocation of both shoulders. The shoulders have since been relocated.  She has had a reduced appetite and has not been eating or drinking much.  She describes soreness around the chest area, which sometimes prevents her from taking deep breaths. She is not normally on oxygen.  Regarding her medication, she has been receiving her blood pressure medications but noted that she had not been receiving her eye drops. She uses ketorolac  and prednisolone  eye drops four times a day for a condition involving a meniscus and buckle oil in her right eye.  In the emergency department patient was noted to be afebrile with blood pressures 98/54 to 122/58, and O2 saturations reported to be as low as 89% with improvement on 2 L nasal cannula oxygen.  Initial imaging included CT scans of the head and cervical spine did not note any acute abnormality.  X-rays revealed anterior right shoulder dislocation and left anterior shoulder dislocations with no other acute abnormality.  Shoulder dislocations were reduced on the ED.  Patient has been given pain medications.  Initial labs from 9/20 noted WBC 11.1, BUN 24, creatinine 0.72.  Case was discussed with Dr. Sharl of orthopedics  who recommended outpatient follow-up in 1 week.  PT/OT evaluated and recommended rehab but patient declined.  Furthermore it was reportedly recommended patient be nonweightbearing of the bilateral upper extremities for least 2 weeks.  Repeat labs on 9/22 noted WBC 10.4, platelets 146, sodium 133, BUN 36, and creatinine 1.52.  Patient was ordered 1 L of lactated Ringer 's.     Review of Systems: As mentioned in the history of present illness. All other systems reviewed and are negative. Past Medical History:  Diagnosis Date   Acute upper respiratory infections of unspecified site    Acute upper respiratory infections of unspecified site    Acute upper respiratory infections of unspecified site    Allergic rhinitis due to pollen    Candidiasis of mouth    Disorder of bone and cartilage, unspecified    Diverticulitis of colon (without mention of hemorrhage)(562.11)    Essential hypertension, benign    Essential hypertension, benign    Extrinsic asthma, unspecified    Herpes simplex with other ophthalmic complications    Other abnormal glucose    Other and unspecified hyperlipidemia    Other and unspecified hyperlipidemia    Other and unspecified hyperlipidemia    Other cataract    Other cataract    Other dystrophy of vulva    Other malaise and fatigue    Rash and other nonspecific skin eruption    Reflux esophagitis    Thrombocytopenia, unspecified    Type II or unspecified type diabetes mellitus without mention of complication, not stated as uncontrolled    Type II  or unspecified type diabetes mellitus without mention of complication, not stated as uncontrolled    Unspecified disorder of skin and subcutaneous tissue    Unspecified essential hypertension    Unspecified essential hypertension    Past Surgical History:  Procedure Laterality Date   ABDOMINAL HYSTERECTOMY     CESAREAN SECTION     [phone]   CYST EXCISION  1974   Pilonidal Cyst excision   EYE SURGERY Right  04/14/2017   Dr. Elner   KNEE ARTHROSCOPY Right    for meniscal tear   RETINAL TEAR REPAIR CRYOTHERAPY  10/21/2017   Getting injections in right eye   Social History:  reports that she quit smoking about 33 years ago. Her smoking use included cigarettes. She has never used smokeless tobacco. She reports that she does not drink alcohol and does not use drugs.  Allergies  Allergen Reactions   Crestor  [Rosuvastatin  Calcium ]     Muscle cramps   Latex Other (See Comments)    GLOVES   Sulfa Antibiotics     Family History  Problem Relation Age of Onset   Cancer Mother        brain   Stroke Father     Prior to Admission medications   Medication Sig Start Date End Date Taking? Authorizing Provider  amLODipine  (NORVASC ) 5 MG tablet TAKE 1 TABLET(5 MG) BY MOUTH DAILY 07/04/24  Yes Ngetich, Dinah C, NP  calcium -vitamin D  (OSCAL WITH D) 500-200 MG-UNIT per tablet Take 1 tablet by mouth 2 (two) times daily. Patient taking differently: Take 1 tablet by mouth daily. 09/25/14  Yes Arnetha Heft, MD  carvedilol  (COREG ) 12.5 MG tablet TAKE 1 TABLET ONCE DAILY FOR BLOOD PRESSURE 11/09/23  Yes Ngetich, Dinah C, NP  cetirizine (ZYRTEC) 10 MG tablet Take 10 mg by mouth at bedtime as needed for allergies.   Yes [provider]  cyanocobalamin  (VITAMIN B12) 1000 MCG tablet Take 1 tablet (1,000 mcg total) by mouth daily. 07/14/23  Yes Ngetich, Dinah C, NP  ELDERBERRY PO Take by mouth 3 (three) times a week.   Yes [provider]  ezetimibe  (ZETIA ) 10 MG tablet Take 1 tablet (10 mg total) by mouth daily. 08/30/24  Yes Ngetich, Dinah C, NP  ibuprofen  (ADVIL ) 200 MG tablet Take 200 mg by mouth as needed for moderate pain (pain score 4-6).   Yes [provider]  ketorolac  (ACULAR ) 0.5 % ophthalmic solution Place 1 drop into the right eye 4 (four) times daily. 03/23/21  Yes [provider]  losartan  (COZAAR ) 100 MG tablet Take 1 tablet (100 mg total) by mouth daily. 08/29/24   Yes Ngetich, Dinah C, NP  Magnesium  Oxide -Mg Supplement 500 MG CAPS Take 2 capsules (1,000 mg total) by mouth at bedtime. 05/17/24  Yes Ngetich, Dinah C, NP  Multiple Vitamins-Minerals (ZINC  PO) Take 1 tablet by mouth once a week.   Yes [provider]  pantoprazole  (PROTONIX ) 40 MG tablet Take 1 tablet (40 mg total) by mouth daily. 07/18/24  Yes Ngetich, Dinah C, NP  phenylephrine  (,USE FOR PREPARATION-H,) 0.25 % suppository Place 1 suppository rectally as needed for hemorrhoids.   Yes [provider]  prednisoLONE  acetate (PRED FORTE ) 1 % ophthalmic suspension Place 1 drop into the right eye 4 (four) times daily.    Yes [provider]  saxagliptin  HCl (ONGLYZA) 2.5 MG TABS tablet Take 1 tablet (2.5 mg total) by mouth daily. 06/04/24  Yes Ngetich, Dinah C, NP  senna-docusate (SENOKOT-S) 8.6-50 MG tablet  Take 1 tablet by mouth at bedtime. 07/18/24  Yes Ngetich, Dinah C, NP  simethicone  (GAS-X) 80 MG chewable tablet Chew 1 tablet (80 mg total) by mouth every 6 (six) hours as needed for flatulence. 10/29/19  Yes Ngetich, Dinah C, NP  sodium chloride  (OCEAN) 0.65 % SOLN nasal spray Place 1 spray into both nostrils as needed for congestion. 04/04/20  Yes Ngetich, Dinah C, NP  witch hazel-glycerin  (TUCKS) pad Apply 1 Application topically as needed for itching. 05/17/24  Yes Ngetich, Roxan BROCKS, NP    Physical Exam: Vitals:   09/03/24 0900 09/03/24 0930 09/03/24 0936 09/03/24 1000  BP: (!) 122/58 102/60  (!) 98/54  Pulse: 66 63  68  Resp: 17 15  15   Temp:   98.5 F (36.9 C)   TempSrc:   Oral   SpO2: (!) 89% 91%  93%  Weight:      Height:       Constitutional: Elderly female currently in no acute distress Eyes: PERRL, lids and conjunctivae normal ENMT: Mucous membranes are moist.  Normal dentition.  Neck: normal, supple, no masses, no thyromegaly Respiratory: clear to auscultation bilaterally, no wheezing, no crackles. Normal respiratory effort  Cardiovascular: Regular rate  and rhythm, no murmurs / rubs / gallops.  2+ pedal pulses.   Abdomen: no tenderness, no masses palpated. No hepatosplenomegaly. Bowel sounds positive.  Musculoskeletal:  .  Bilateral shoulders currently in sling.  Inability to move or extend right hand  skin: no rashes, lesions, ulcers. No induration Neurologic: CN 2-12 grossly intact.  Strength is decreased in the right hand. Psychiatric: Normal judgment and insight. Alert and oriented x 3. Normal mood.   Data Reviewed:  Reviewed labs, imaging, and pertinent records as documented.  Assessment and Plan:   Bilateral shoulder dislocation secondary to fall Possible nerve palsy of right hand Patient presented after having a fall on 9/20 found to have bilateral shoulder dislocations which were reduced while in the ED. patient noted to have decreased ability to move her right hand with patient was recommended for possible rehab, but reports that she does not want to go. - Incentive spirometry - Nonweightbearing on the bilateral upper extremities - Oxycodone  as needed for pain - Continue PT/OT  - Transitions of care consulted - Will need outpatient follow-up with orthopedics  Acute kidney injury creatinine has been elevated up to 1.52 with BUN 36.  Baseline creatinine previously had been around 0.72 on admission on 9/20.  Patient was ordered 1 L of lactated Ringer 's - Admit to a medical telemetry bed - Check urinalysis and CK(286) - Avoid possible nephrotoxic agents - Continue normal saline IV fluids at 75 mL/h - Recheck kidney function in a.m.  Hypoxia Acute.  O2 saturations noted to be as low as 89% for which patient was placed on nasal cannula oxygen with improvement greater than 90%.  Symptoms thought secondary to discomfort plated with shoulder dislocation - Continuous pulse oximetry with oxygen maintain O2 saturation greater than 90% - Incentive spirometry Pain control-   Essential hypertension Blood pressures are currently  maintained. - Continue amlodipine  and Coreg  - Held losartan  due to AKI  Controlled diabetes mellitus type 2, without long-term use of insulin Glucose levels have been around 130s to 140s.  Last available hemoglobin A1c noted to be 6.2 when last checked on 07/10/2024. - Continue Tradjenta   Hyperlipidemia - Continue Zetia   History of retinal detachment - Continue ketorolac  and prednisolone  eyedrop  GERD - Continue Protonix   DVT prophylaxis: Lovenox  Advance  Care Planning:   Code Status: Full Code    Consults: None  Family Communication: Family updated at bedside  Severity of Illness: The appropriate patient status for this patient is INPATIENT. Inpatient status is judged to be reasonable and necessary in order to provide the required intensity of service to ensure the patient's safety. The patient's presenting symptoms, physical exam findings, and initial radiographic and laboratory data in the context of their chronic comorbidities is felt to place them at high risk for further clinical deterioration. Furthermore, it is not anticipated that the patient will be medically stable for discharge from the hospital within 2 midnights of admission.   * I certify that at the point of admission it is my clinical judgment that the patient will require inpatient hospital care spanning beyond 2 midnights from the point of admission due to high intensity of service, high risk for further deterioration and high frequency of surveillance required.*  Author: Maximino DELENA Sharps, MD 09/03/2024 3:25 PM  For on call review [email or web address].

## 2024-09-03 NOTE — Progress Notes (Addendum)
 Occupational Therapy Treatment Patient Details Name: Tanya Bell MRN: [id number] DOB: 12/07/1941 Today's Date: 09/03/2024   History of present illness Pt is an 83 y/o F presenting to ED on 9/20 after fall resulting in bil shoulder dislocations, both reduced in the ED. Pt with likely residual RUE nerve palsy. PMH includes DM2, HTN   OT comments  Pt seen for second session for BUE therex. Pt able to perform elbow/wrist/hand exercises for LUE, tolerates PROM of RUE at elbow/wrist/hand as well. Repositioned splint on pt's RUE and no noted signs of skin breakdown/irritation, pt with hypersensitivity of R thumb with movement. Placed washcloth roll under pt's hand to let digits passively rest in extension. Pt presenting with impairments listed below, will follow acutely. Given functional deficits/restrictions and decr home support, patient will benefit from continued inpatient follow up therapy, <3 hours/day.       If plan is discharge home, recommend the following:  A little help with walking and/or transfers;A lot of help with bathing/dressing/bathroom;Assistance with cooking/housework;Direct supervision/assist for medications management;Assist for transportation;Direct supervision/assist for financial management;Help with stairs or ramp for entrance   Equipment Recommendations  Tub/shower bench;BSC/3in1    Recommendations for Other Services PT consult    Precautions / Restrictions Precautions Precautions: Fall;Other (comment) Recall of Precautions/Restrictions: Intact Precaution/Restrictions Comments: Bil shoulder dislocations, reduced in ED Required Braces or Orthoses: Sling;Splint/Cast Splint/Cast: RUE splint Restrictions Weight Bearing Restrictions Per Provider Order: Yes RUE Weight Bearing Per Provider Order: Non weight bearing LUE Weight Bearing Per Provider Order: Non weight bearing Other Position/Activity Restrictions: per secure chat from Dr. Catha on 09/02/2024 at 9:43  PM, ok to use BUE for ADLs PRN       Mobility Bed Mobility Overal bed mobility: Needs Assistance Bed Mobility: Supine to Sit, Sit to Supine     Supine to sit: Min assist Sit to supine: Mod assist   General bed mobility comments: NT, seen for therex    Transfers Overall transfer level: Needs assistance Equipment used: None Transfers: Sit to/from Stand Sit to Stand: Contact guard assist           General transfer comment: NT seen for therex     Balance Overall balance assessment: Needs assistance Sitting-balance support: Feet supported, No upper extremity supported Sitting balance-Leahy Scale: Fair     Standing balance support: No upper extremity supported Standing balance-Leahy Scale: Fair                             ADL either performed or assessed with clinical judgement   ADL Overall ADL's : Needs assistance/impaired     Grooming: Minimal assistance;Oral care;Standing Grooming Details (indicate cue type and reason): assist to doff cap from toothpaste         Upper Body Dressing : Maximal assistance Upper Body Dressing Details (indicate cue type and reason): donning/doffing slings Lower Body Dressing: Moderate assistance Lower Body Dressing Details (indicate cue type and reason): slipping shoes on/off Toilet Transfer: Contact guard assist;Ambulation   Toileting- Clothing Manipulation and Hygiene: Minimal assistance Toileting - Clothing Manipulation Details (indicate cue type and reason): cues to lean forward to use LUE for anterior pericare     Functional mobility during ADLs: Minimal assistance General ADL Comments: seen for therex    Extremity/Trunk Assessment Upper Extremity Assessment Upper Extremity Assessment: Right hand dominant;LUE deficits/detail;RUE deficits/detail RUE Deficits / Details: minimal DIP activation in R middle finger, PROM WFL, R thumb hypersensitivity, digits resting in slight flexion  RUE: Unable to fully assess  due to immobilization RUE Sensation: decreased light touch;decreased proprioception RUE Coordination: decreased fine motor;decreased gross motor LUE Deficits / Details: WNL for hand/wrist/elbow, fine motor/pinch weakness LUE: Unable to fully assess due to immobilization (shoulder ROM not assessed) LUE Sensation: decreased light touch LUE Coordination: decreased fine motor   Lower Extremity Assessment Lower Extremity Assessment: Defer to PT evaluation        Vision   Vision Assessment?: No apparent visual deficits   Perception Perception Perception: Not tested   Praxis Praxis Praxis: Not tested   Communication Communication Communication: No apparent difficulties   Cognition Arousal: Alert Behavior During Therapy: WFL for tasks assessed/performed Cognition: Cognition impaired     Awareness: Online awareness impaired, Intellectual awareness impaired       OT - Cognition Comments: poor insight to deficits and level of assist she will need at d/c, although does recall precautions                 Following commands: Intact        Cueing   Cueing Techniques: Verbal cues  Exercises Exercises: Other exercises, General Upper Extremity General Exercises - Upper Extremity Elbow Flexion: PROM, AROM, 10 reps, Supine, Both (PROM RUE, AROM LUE) Elbow Extension: PROM, AROM, Both, 10 reps, Supine (PROM RUE, AROM LUE) Wrist Flexion: PROM, AROM, Both, 10 reps, Supine (PROM RUE, AROM LUE) Wrist Extension: PROM, AROM, Both, 10 reps, Supine (PROM RUE, AROM LUE) Digit Composite Flexion: PROM, AROM, Both, 10 reps, Supine (PROM RUE, AROM LUE) Composite Extension: PROM, AROM, Left, 10 reps, Supine (PROM RUE, AROM LUE) Other Exercises Other Exercises: Thumb flex/ext PROM RUE Other Exercises: self composite digit extension of RUE    Shoulder Instructions       General Comments VSS on 2L O2    Pertinent Vitals/ Pain       Pain Assessment Pain Assessment: Faces Pain Score: 5   Faces Pain Scale: Hurts even more Pain Location: R thumb with PROM Pain Descriptors / Indicators: Discomfort, Shooting Pain Intervention(s): Limited activity within patient's tolerance, Monitored during session, Repositioned  Home Living Family/patient expects to be discharged to:: Private residence                                        Prior Functioning/Environment              Frequency  Min 2X/week        Progress Toward Goals  OT Goals(current goals can now be found in the care plan section)  Progress towards OT goals: Progressing toward goals  Acute Rehab OT Goals Patient Stated Goal: none stated OT Goal Formulation: With patient Time For Goal Achievement: 09/16/24 Potential to Achieve Goals: Good ADL Goals Pt Will Perform Upper Body Dressing: sitting;with min assist Pt Will Perform Lower Body Dressing: with min assist;sitting/lateral leans;sit to/from stand Pt Will Transfer to Toilet: with min assist;ambulating;regular height toilet Pt Will Perform Tub/Shower Transfer: Tub transfer;Shower transfer;tub bench;with min assist;ambulating Additional ADL Goal #1: pt will perform bed mobility min A in prep for ADLs.  Plan      Co-evaluation                 AM-PAC OT 6 Clicks Daily Activity     Outcome Measure   Help from another person eating meals?: A Little Help from another person taking care of personal grooming?: A  Little Help from another person toileting, which includes using toliet, bedpan, or urinal?: A Lot Help from another person bathing (including washing, rinsing, drying)?: A Lot Help from another person to put on and taking off regular upper body clothing?: A Lot Help from another person to put on and taking off regular lower body clothing?: A Lot 6 Click Score: 14    End of Session Equipment Utilized During Treatment: Other (comment) (BUE slings)  OT Visit Diagnosis: Unsteadiness on feet (R26.81);Other abnormalities  of gait and mobility (R26.89);Muscle weakness (generalized) (M62.81)   Activity Tolerance Patient tolerated treatment well   Patient Left in bed;with call bell/phone within reach;with family/visitor present   Nurse Communication Mobility status        Time: 8453-8396 OT Time Calculation (min): 17 min  Charges: OT General Charges $OT Visit: 1 Visit OT Treatments  $Therapeutic Exercise: 8-22 mins  Rylynne Schicker K, OTD, OTR/L SecureChat Preferred Acute Rehab [phone]   Laneta POUR Koonce 09/03/2024, 4:32 PM

## 2024-09-03 NOTE — Progress Notes (Addendum)
 Occupational Therapy Treatment Patient Details Name: Tanya Bell MRN: [id number] DOB: 28-Mar-1941 Today's Date: 09/03/2024   History of present illness Pt is an 83 y/o F presenting to ED on 9/20 after fall resulting in bil shoulder dislocations, both reduced in the ED.     PMH includes DM2, HTN   OT comments  Pt progressing toward goals, now with new bil UE NWB precautions, pt able to ambulate with CGA, needs min-mod A for bed mobility from high stretcher, however pt's bed at home is higher than stretcher level. Pt needs up to max A for ADLs, discussed leaning forward and pt able to perform anterior pericare with LUE. Pt with mild instability/sway with hallway distance ambulation. VSS on 2L O2. Pt presenting with impairments listed below, will follow acutely. Patient will benefit from continued inpatient follow up therapy, <3 hours/day, if unable will need max HHOT services and aide services with transition to OP OT once cleared by orthopedics.       If plan is discharge home, recommend the following:  A little help with walking and/or transfers;A lot of help with bathing/dressing/bathroom;Assistance with cooking/housework;Direct supervision/assist for medications management;Assist for transportation;Direct supervision/assist for financial management;Help with stairs or ramp for entrance   Equipment Recommendations  Tub/shower bench;BSC/3in1    Recommendations for Other Services PT consult    Precautions / Restrictions Precautions Precautions: Fall;Other (comment) Recall of Precautions/Restrictions: Intact Precaution/Restrictions Comments: Bil shoulder dislocations, reduced in ED Required Braces or Orthoses: Sling;Splint/Cast Splint/Cast: RUE splint Restrictions Weight Bearing Restrictions Per Provider Order: Yes RUE Weight Bearing Per Provider Order: Non weight bearing LUE Weight Bearing Per Provider Order: Non weight bearing Other Position/Activity Restrictions: per secure  chat from Dr. Catha on 09/02/2024 at 9:43 PM, ok to use BUE for ADLs PRN       Mobility Bed Mobility Overal bed mobility: Needs Assistance Bed Mobility: Supine to Sit, Sit to Supine     Supine to sit: Min assist Sit to supine: Mod assist   General bed mobility comments: mod A to return to bed    Transfers Overall transfer level: Needs assistance Equipment used: None Transfers: Sit to/from Stand Sit to Stand: Contact guard assist           General transfer comment: cues to keep slow/steady pace, pt wtih mild LOB/drift with turns     Balance Overall balance assessment: Needs assistance Sitting-balance support: Feet supported, No upper extremity supported Sitting balance-Leahy Scale: Fair     Standing balance support: No upper extremity supported Standing balance-Leahy Scale: Fair                             ADL either performed or assessed with clinical judgement   ADL Overall ADL's : Needs assistance/impaired     Grooming: Minimal assistance;Oral care;Standing Grooming Details (indicate cue type and reason): assist to doff cap from toothpaste         Upper Body Dressing : Maximal assistance Upper Body Dressing Details (indicate cue type and reason): donning/doffing slings Lower Body Dressing: Moderate assistance Lower Body Dressing Details (indicate cue type and reason): slipping shoes on/off Toilet Transfer: Contact guard assist;Ambulation   Toileting- Clothing Manipulation and Hygiene: Minimal assistance Toileting - Clothing Manipulation Details (indicate cue type and reason): cues to lean forward to use LUE for anterior pericare     Functional mobility during ADLs: Minimal assistance      Extremity/Trunk Assessment Upper Extremity Assessment Upper Extremity Assessment: Right  hand dominant RUE Deficits / Details: minimal DIP activation in R middle finger LUE Deficits / Details: WNL for hand/wrist/elbow   Lower Extremity  Assessment Lower Extremity Assessment: Defer to PT evaluation        Vision   Vision Assessment?: No apparent visual deficits   Perception Perception Perception: Not tested   Praxis Praxis Praxis: Not tested   Communication Communication Communication: No apparent difficulties   Cognition Arousal: Alert Behavior During Therapy: WFL for tasks assessed/performed Cognition: Cognition impaired     Awareness: Online awareness impaired, Intellectual awareness impaired       OT - Cognition Comments: poor insight to deficits, although does recall precaution                 Following commands: Intact        Cueing   Cueing Techniques: Verbal cues  Exercises      Shoulder Instructions       General Comments VSS on 2L o2    Pertinent Vitals/ Pain       Pain Assessment Pain Assessment: Faces Pain Score: 5  Faces Pain Scale: Hurts little more Pain Location: shoulders Pain Descriptors / Indicators: Grimacing Pain Intervention(s): Limited activity within patient's tolerance, Monitored during session, Repositioned  Home Living                                          Prior Functioning/Environment              Frequency  Min 2X/week        Progress Toward Goals  OT Goals(current goals can now be found in the care plan section)  Progress towards OT goals: Progressing toward goals  Acute Rehab OT Goals Patient Stated Goal: none stated OT Goal Formulation: With patient Time For Goal Achievement: 09/16/24 Potential to Achieve Goals: Good ADL Goals Pt Will Perform Upper Body Dressing: sitting;with min assist Pt Will Perform Lower Body Dressing: with min assist;sitting/lateral leans;sit to/from stand Pt Will Transfer to Toilet: with min assist;ambulating;regular height toilet Pt Will Perform Tub/Shower Transfer: Tub transfer;Shower transfer;tub bench;with min assist;ambulating Additional ADL Goal #1: pt will perform bed mobility  min A in prep for ADLs.  Plan      Co-evaluation                 AM-PAC OT 6 Clicks Daily Activity     Outcome Measure   Help from another person eating meals?: A Little Help from another person taking care of personal grooming?: A Little Help from another person toileting, which includes using toliet, bedpan, or urinal?: A Lot Help from another person bathing (including washing, rinsing, drying)?: A Lot Help from another person to put on and taking off regular upper body clothing?: A Lot Help from another person to put on and taking off regular lower body clothing?: A Lot 6 Click Score: 14    End of Session Equipment Utilized During Treatment: Other (comment) (BUE slings)  OT Visit Diagnosis: Unsteadiness on feet (R26.81);Other abnormalities of gait and mobility (R26.89);Muscle weakness (generalized) (M62.81)   Activity Tolerance Patient tolerated treatment well   Patient Left in bed;with call bell/phone within reach;with family/visitor present   Nurse Communication Mobility status        Time: 8569-8482 OT Time Calculation (min): 47 min  Charges: OT General Charges $OT Visit: 1 Visit OT Treatments $Self Care/Home Management : 23-37  mins $Therapeutic Activity: 8-22 mins  Chanique Duca K, OTD, OTR/L SecureChat Preferred Acute Rehab [phone]   Laneta MARLA Pereyra 09/03/2024, 3:32 PM

## 2024-09-03 NOTE — Progress Notes (Addendum)
 CSW spoke with pt by phone and she still does NOT wish to go to rehab. She inquired about in house therapy and CSW informed that PT stated she does not qualify for Acute Inpatient Rehab. CSW informed care team (EDP, bedside RN, RNCM, and PT) that our option remains returning pt home with HH/DME unless she is meeting criteria for inpatient admission. ICM supervisor aware of the case.  Addend @ 2:05PM EDP, Dr. Dreama, reports pt will be admitted for PE study.

## 2024-09-03 NOTE — ED Provider Notes (Signed)
 Emergency Medicine Observation Re-evaluation Note  Tanya Bell is a 83 y.o. female, seen on rounds today.  Pt initially presented to the ED for complaints of Fall Currently, the patient is holding the emergency department after she presented with bilateral shoulder dislocations and fall.  At the time it was not felt she met criteria for admission to the hospital, but was unsafe to go home to exercise activities of daily living, and has been working with PT OT in the emergency department.  The patient declined admission to SNF.  Her plan is to have walker delivered, work with PT OT and go home today.   On reevaluation and discussion with PT --she is now requiring oxygen, when she had not at home or prior to arrival.  Initially, it was felt that her oxygen use was due to pain medications, however she has not had pain medications for about 6 hours and per PT was continued to have desaturation during their time in the room.  Physical Exam  BP 111/63   Pulse 69   Temp 98.3 F (36.8 C)   Resp (!) 9   Ht 5' 1 (1.549 m)   Wt 68.5 kg   SpO2 98%   BMI 28.53 kg/m  Physical Exam General: NAD Cardiac: RRR  Lungs: clear bilaterally   ED Course / MDM  EKG:EKG Interpretation Date/Time:  Saturday September 01 2024 17:39:17 EDT Ventricular Rate:  77 PR Interval:  234 QRS Duration:  75 QT Interval:  374 QTC Calculation: 424 R Axis:   86  Text Interpretation: Sinus rhythm Prolonged PR interval Borderline right axis deviation Abnormal T, consider ischemia, diffuse leads Confirmed by Pamella Sharper [phone]) on 09/01/2024 10:40:44 PM   Plan   It appears her weightbearing recommendations have been clarified with orthopedics, who recommends that she be nonweightbearing to her bilateral upper extremities.  Physical therapy notes that she is able to ambulate, however transitions will be difficult with her inability to bear weight on her arms, will have difficulty standing, and difficulty caring  for herself at home.  In addition, PT notes they took her off of oxygen as she is not on it at home and she was found to have hypoxia down to 89% and was requiring 2 L of oxygen.  Due to her hypoxia, CT PE study was completed and shows no evidence of PE or other intrathoracic abnormalities.  I do suspect her hypoxia may be secondary to atelectasis, pain control and she will need continued pulmonary toilet.  She was found to have an acute kidney injury on her labs with a creatinine more than 2 times her baseline 2 days ago.  Given this AKI, new oxygen requirement, as well as her physical therapy and rehab needs and inability to care for herself or her husband, she was admitted for further care.     Dreama Longs, MD 09/03/24 1731

## 2024-09-04 ENCOUNTER — Ambulatory Visit: Admitting: Podiatry

## 2024-09-04 DIAGNOSIS — S43006A Unspecified dislocation of unspecified shoulder joint, initial encounter: Secondary | ICD-10-CM | POA: Diagnosis not present

## 2024-09-04 LAB — CBC
HCT: 33.9 % — ABNORMAL LOW (ref 36.0–46.0)
Hemoglobin: 11.7 g/dL — ABNORMAL LOW (ref 12.0–15.0)
MCH: 25.6 pg — ABNORMAL LOW (ref 26.0–34.0)
MCHC: 34.5 g/dL (ref 30.0–36.0)
MCV: 74.2 fL — ABNORMAL LOW (ref 80.0–100.0)
Platelets: 137 K/uL — ABNORMAL LOW (ref 150–400)
RBC: 4.57 MIL/uL (ref 3.87–5.11)
RDW: 15.9 % — ABNORMAL HIGH (ref 11.5–15.5)
WBC: 10.1 K/uL (ref 4.0–10.5)
nRBC: 0 % (ref 0.0–0.2)

## 2024-09-04 LAB — BASIC METABOLIC PANEL WITH GFR
Anion gap: 7 (ref 5–15)
BUN: 36 mg/dL — ABNORMAL HIGH (ref 8–23)
CO2: 25 mmol/L (ref 22–32)
Calcium: 8.7 mg/dL — ABNORMAL LOW (ref 8.9–10.3)
Chloride: 103 mmol/L (ref 98–111)
Creatinine, Ser: 1.15 mg/dL — ABNORMAL HIGH (ref 0.44–1.00)
GFR, Estimated: 48 mL/min — ABNORMAL LOW (ref >60)
Glucose, Bld: 122 mg/dL — ABNORMAL HIGH (ref 70–99)
Potassium: 3.7 mmol/L (ref 3.5–5.1)
Sodium: 135 mmol/L (ref 135–145)

## 2024-09-04 LAB — GLUCOSE, CAPILLARY
Glucose-Capillary: 131 mg/dL — ABNORMAL HIGH (ref 70–99)
Glucose-Capillary: 90 mg/dL (ref 70–99)
Glucose-Capillary: 121 mg/dL — ABNORMAL HIGH (ref 70–99)
Glucose-Capillary: 145 mg/dL — ABNORMAL HIGH (ref 70–99)

## 2024-09-04 MED ORDER — ENOXAPARIN SODIUM 40 MG/0.4ML IJ SOSY
40.0000 mg | PREFILLED_SYRINGE | INTRAMUSCULAR | Status: DC
  Administered 2024-09-04 – 2024-09-10 (×7): 40 mg via SUBCUTANEOUS
  Filled 2024-09-04 (×7): qty 0.4

## 2024-09-04 NOTE — Progress Notes (Signed)
 Occupational Therapy Treatment Patient Details Name: Tanya Bell MRN: [id number] DOB: [birth year] Today's Date: 09/04/2024   History of present illness Pt is an 83 y/o F presenting to ED on 9/20 after fall resulting in bil shoulder dislocations, both reduced in the ED. Pt with likely residual RUE nerve pasly. PMH includes DM2, HTN   OT comments  Pt overall max A with ADL tasks due to limited use of BUE. Pt without any functional elbow flexion or grasp on RUE due to apparent nerve involvement. After session, Tanya Ned PA confirmed that pt will be able to use BUE for ADL and slings are for comfort only however pt is NWB BUE. Continue to recommend post acute rehab < 3 hrs/day however pt refusing rehab and wants to DC home. Acute OT to follow.  Pt to wear wrist cock-up splint during the day, which needs to be removed q 2 hours for skin checks. A resting hand splint will be ordered for R hand as she does not have any functional movement of digits.      If plan is discharge home, recommend the following:  A little help with walking and/or transfers;A lot of help with bathing/dressing/bathroom;Assistance with cooking/housework;Direct supervision/assist for medications management;Assist for transportation;Direct supervision/assist for financial management;Help with stairs or ramp for entrance   Equipment Recommendations  Tub/shower bench;BSC/3in1    Recommendations for Other Services PT consult    Precautions / Restrictions Precautions Precautions: Fall;Other (comment) Recall of Precautions/Restrictions: Intact Precaution/Restrictions Comments: Bil shoulder dislocations, reduced in ED Required Braces or Orthoses: Sling;Splint/Cast (sling for comfort per PA 9/23; recommend sling for RUE due to apparent nerve injury; LUE - sling for comfort only) Splint/Cast: RUE splint  - wrist cock-up; resting hand splint ordered 9/23 Restrictions Weight Bearing Restrictions Per Provider Order:  No RUE Weight Bearing Per Provider Order: Non weight bearing LUE Weight Bearing Per Provider Order: Non weight bearing       Mobility Bed Mobility               General bed mobility comments: OOB in chair    Transfers Overall transfer level: Needs assistance Equipment used: None Transfers: Sit to/from Stand Sit to Stand: Contact guard assist                 Balance Overall balance assessment: Needs assistance Sitting-balance support: Feet supported, No upper extremity supported Sitting balance-Leahy Scale: Fair     Standing balance support: No upper extremity supported Standing balance-Leahy Scale: Fair                             ADL either performed or assessed with clinical judgement   ADL   Eating/Feeding: Moderate assistance   Grooming: Maximal assistance   Upper Body Bathing: Maximal assistance   Lower Body Bathing: Maximal assistance   Upper Body Dressing : Maximal assistance   Lower Body Dressing: Maximal assistance   Toilet Transfer: Minimal assistance   Toileting- Clothing Manipulation and Hygiene: Maximal assistance       Functional mobility during ADLs: Minimal assistance      Extremity/Trunk Assessment Upper Extremity Assessment Upper Extremity Assessment: RUE deficits/detail;LUE deficits/detail;Right hand dominant RUE Deficits / Details: no funciotnal use R hand - no movemetn observed; unable to flex elbow, extension 2/5; shoulder not tested - will further assess as pt is allowed to use RUE for ADL RUE Sensation: decreased light touch;decreased proprioception RUE Coordination: decreased fine motor;decreased gross motor (non-functional) LUE  Deficits / Details: elbow/wrist/hand WFL ROM; strength @ 3+/5. apparent neuropraxia LUE Sensation: decreased light touch LUE Coordination: decreased fine motor;decreased gross motor   Lower Extremity Assessment Lower Extremity Assessment: Defer to PT evaluation        Vision        Perception     Praxis     Communication Communication Communication: No apparent difficulties   Cognition Arousal: Alert Behavior During Therapy: WFL for tasks assessed/performed Cognition: No family/caregiver present to determine baseline     Awareness: Online awareness impaired       OT - Cognition Comments: unable to use BUE  however wanting to go home to take care of her husband                 Following commands: Intact        Cueing   Cueing Techniques: Verbal cues  Exercises Other Exercises Other Exercises: issued B squeeze foams    Shoulder Instructions       General Comments VSS on RA    Pertinent Vitals/ Pain       Pain Assessment Pain Assessment: Faces Faces Pain Scale: Hurts even more Pain Location: RUE/LUE Pain Descriptors / Indicators: Discomfort, Grimacing, Guarding, Pins and needles Pain Intervention(s): Limited activity within patient's tolerance  Home Living                                          Prior Functioning/Environment              Frequency  Min 3X/week        Progress Toward Goals  OT Goals(current goals can now be found in the care plan section)  Progress towards OT goals: Progressing toward goals  Acute Rehab OT Goals Patient Stated Goal: go home to take care of her husband OT Goal Formulation: With patient Time For Goal Achievement: 09/16/24 Potential to Achieve Goals: Good ADL Goals Pt Will Perform Upper Body Dressing: sitting;with min assist Pt Will Perform Lower Body Dressing: with min assist;sitting/lateral leans;sit to/from stand Pt Will Transfer to Toilet: with min assist;ambulating;regular height toilet Pt Will Perform Tub/Shower Transfer: Tub transfer;Shower transfer;tub bench;with min assist;ambulating Additional ADL Goal #1: pt will perform bed mobility min A in prep for ADLs.  Plan      Co-evaluation                 AM-PAC OT 6 Clicks Daily Activity      Outcome Measure   Help from another person eating meals?: A Lot Help from another person taking care of personal grooming?: A Lot Help from another person toileting, which includes using toliet, bedpan, or urinal?: A Lot Help from another person bathing (including washing, rinsing, drying)?: A Lot Help from another person to put on and taking off regular upper body clothing?: A Lot Help from another person to put on and taking off regular lower body clothing?: A Lot 6 Click Score: 12    End of Session    OT Visit Diagnosis: Unsteadiness on feet (R26.81);Other abnormalities of gait and mobility (R26.89);Muscle weakness (generalized) (M62.81)   Activity Tolerance Patient tolerated treatment well   Patient Left in chair;with call bell/phone within reach;with nursing/sitter in room;with family/visitor present   Nurse Communication Mobility status        Time: 0939-1010 OT Time Calculation (min): 31 min  Charges: OT General Charges $OT Visit:  1 Visit OT Treatments $Self Care/Home Management : 23-37 mins  Kreg Sink, OT/L   Acute OT Clinical Specialist Acute Rehabilitation Services Pager [phone] Office [phone]   [facility] 09/04/2024, 1:38 PM

## 2024-09-04 NOTE — Progress Notes (Signed)
  Progress Note   Patient: Tanya Bell FMW:[id number] DOB: 1941-08-04 DOA: 09/01/2024     1 DOS: the patient was seen and examined on 09/04/2024        Brief hospital course: 83 y.o. F with HTN, DM, HLD who presented with fall at the grocery store.  She was evaluated in the ER and found to have dislocation of both shoulders.  These were reduced in the ER, and she was kept in the ER overnight for PT evaluation.  During that time, she had reduced appetite, likely due to immobility, pain, and opiates, and developed hypotension, hypoxia, and AKI, so she was given IV fluids and admitted to the hospital.     Assessment and Plan: AKI due to dehydration Patient had some transient low blood pressure, was given losartan  in the ER.  Given IV fluids overnight and creatinine started to improve.  Baseline creatinine is 0.7, peak creatinine here was 1.6 -Hold losartan  - Push oral fluids   Hypoxia due to atelectasis This is resolved.  CT angiogram of the chest showed no PE, no airspace disease, no edema. -Incentive spirometer   Bilateral dislocated shoulders Right arm paralysis due to brachial plexus injury Arms reduced in the ER.  Orthopedics recommended nonoperative management, outpatient follow-up.  We expect the brachioplexus injury to gradually resolve - Nonweightbearing bilateral upper extremities - PT/OT - Bilateral arm slings is only for comfort - Analgesics as needed  Hypertension Blood pressure trending up - Hold amlodipine , carvedilol , losartan  for now  Diabetes Glucose controlled - Continue linagliptin  - Okay to defer sliding scale corrections for now  Hyperlipidemia -Continue Zetia   Glaucoma - Continue Acular , Pred forte       Subjective: No change, overall feels tired, has a lot of pain, cannot move the arm on the right at all yet.     Physical Exam: BP (!) 141/90 (BP Location: Left Leg)   Pulse 69   Temp 98.3 F (36.8 C) (Oral)   Resp 18   Ht  5' 1 (1.549 m)   Wt 68.5 kg   SpO2 95%   BMI 28.53 kg/m   Elderly adult female, sitting up in recliner, interactive and appropriate RRR, no murmurs, no peripheral edema Respiratory rate normal, lungs clear without rales or wheezes Abdomen soft, no tenderness palpation or guarding She has psychomotor slowing, she responds to questions, she is oriented, memory seems normal, but she is very weak The right upper extremity is nearly flaccid, she can barely twitch her right fingers.  The left arm is weak, pain limits shoulder abduction completely, although she is able to passively abduct the shoulder.    Data Reviewed: Basic metabolic panel shows creatinine down to 1.1 CBC shows no leukocytosis    Family Communication: Daughter Tanya Bell by phone    Disposition: Status is: Inpatient This is an 83 year old female who had bilateral shoulder dislocations.  At baseline she is independent, still drives, mobilizes without assistance.  At present, she has right arm paralysis, and is nonweightbearing on both arms, will require substantial and prolonged rehabilitation to return to her prior level of function.        Author: Lonni SHAUNNA Dalton, MD 09/04/2024 6:53 PM  For on call review [email or web address].

## 2024-09-04 NOTE — Progress Notes (Signed)
 Physical Therapy Treatment Patient Details Name: Tanya Bell MRN: [id number] DOB: [birth year] Today's Date: 09/04/2024   History of Present Illness Pt is an 83 y/o F presenting to ED on 9/20 after fall resulting in bil shoulder dislocations, both reduced in the ED. Pt with likely residual RUE nerve pasly. PMH includes DM2, HTN    PT Comments  Pt resting in bed on arrival, pleasant and agreeable to session. Pt continues to be limited in safe mobility by poor balance/postural reactions, decreased insight into current deficits, and decreased activity tolerance. Pt needing total A to adjust bil slings for proper/optimal UE positioning. Pt requiring increased assist, up to mod A, to complete bed mobility while maintaining weight bearing precautions. Pt reporting that although her bed does adjust at home, she does not have an alternate sleeping arrangement (recliner, etc) and is worried how she will complete at home without assist. Pt able to rise to stand with CGA for safety and demonstrating gait with grossly CGA however pt with increased lateral postural sway and needing up to min A to maintain balance with multiple scissoring steps. Pt spouse present throughout session. Current plan remains appropriate to address deficits and maximize functional independence and safety as pt without physical assist for mobility or ADL management at home. Pt continues to benefit from skilled PT services to progress toward functional mobility goals.      If plan is discharge home, recommend the following: A little help with walking and/or transfers;A lot of help with bathing/dressing/bathroom;Assistance with cooking/housework;Assist for transportation;Help with stairs or ramp for entrance   Can travel by private vehicle     Yes  Equipment Recommendations  None recommended by PT    Recommendations for Other Services       Precautions / Restrictions Precautions Precautions: Fall;Other (comment) Recall of  Precautions/Restrictions: Intact Precaution/Restrictions Comments: Bil shoulder dislocations, reduced in ED Required Braces or Orthoses: Sling;Splint/Cast Splint/Cast: RUE splint Restrictions Weight Bearing Restrictions Per Provider Order: No RUE Weight Bearing Per Provider Order: Non weight bearing LUE Weight Bearing Per Provider Order: Non weight bearing     Mobility  Bed Mobility Overal bed mobility: Needs Assistance Bed Mobility: Supine to Sit     Supine to sit: Mod assist     General bed mobility comments: mod a to manage BLEs to and off EOB and to elevate trunk while maintaining NWB, assist to scoot out to EOB    Transfers Overall transfer level: Needs assistance Equipment used: None Transfers: Sit to/from Stand Sit to Stand: Contact guard assist           General transfer comment: CGA to rise with pt blocking LEs at EOB to self steady    Ambulation/Gait Ambulation/Gait assistance: Contact guard assist, Min assist Gait Distance (Feet): 275 Feet Assistive device: None Gait Pattern/deviations: Step-through pattern, Drifts right/left Gait velocity: reduced     General Gait Details: pt with increased lateral sway needing up to min A to maintain balance with scissoring steps L and R, multiple standing rest breaks due to fatigue   Stairs             Wheelchair Mobility     Tilt Bed    Modified Rankin (Stroke Patients Only)       Balance Overall balance assessment: Needs assistance Sitting-balance support: Feet supported, No upper extremity supported Sitting balance-Leahy Scale: Fair     Standing balance support: No upper extremity supported Standing balance-Leahy Scale: Fair  Communication Communication Communication: No apparent difficulties  Cognition Arousal: Alert Behavior During Therapy: WFL for tasks assessed/performed   PT - Cognitive impairments: No apparent impairments                        PT - Cognition Comments: decreased inishgt into current deficits and their effect on ability to care for self and spouse at home Following commands: Intact      Cueing Cueing Techniques: Verbal cues  Exercises      General Comments General comments (skin integrity, edema, etc.): VSS on RA      Pertinent Vitals/Pain Pain Assessment Pain Assessment: Faces Faces Pain Scale: Hurts even more Pain Location: RUE Pain Descriptors / Indicators: Discomfort, Grimacing, Guarding Pain Intervention(s): Monitored during session, Limited activity within patient's tolerance, Other (comment) (adjusted slings)    Home Living                          Prior Function            PT Goals (current goals can now be found in the care plan section) Acute Rehab PT Goals PT Goal Formulation: With patient Time For Goal Achievement: 09/16/24 Progress towards PT goals: Progressing toward goals    Frequency    Min 3X/week      PT Plan      Co-evaluation              AM-PAC PT 6 Clicks Mobility   Outcome Measure  Help needed turning from your back to your side while in a flat bed without using bedrails?: A Little Help needed moving from lying on your back to sitting on the side of a flat bed without using bedrails?: A Little Help needed moving to and from a bed to a chair (including a wheelchair)?: A Little Help needed standing up from a chair using your arms (e.g., wheelchair or bedside chair)?: A Little Help needed to walk in hospital room?: A Little Help needed climbing 3-5 steps with a railing? : Total 6 Click Score: 16    End of Session Equipment Utilized During Treatment: Gait belt Activity Tolerance: Patient tolerated treatment well Patient left: with call bell/phone within reach;with family/visitor present;in chair Nurse Communication: Mobility status PT Visit Diagnosis: Unsteadiness on feet (R26.81);Other abnormalities of gait and mobility  (R26.89);Pain;Muscle weakness (generalized) (M62.81) Pain - Right/Left: Right (knee) Pain - part of body: Shoulder (Bilateral)     Time: 9090-9065 PT Time Calculation (min) (ACUTE ONLY): 25 min  Charges:    $Gait Training: 23-37 mins PT General Charges $$ ACUTE PT VISIT: 1 Visit                     Brandy Kabat R. PTA Acute Rehabilitation Services Office: [phone]   Therisa CHRISTELLA Boor 09/04/2024, 12:02 PM

## 2024-09-04 NOTE — Progress Notes (Signed)
 Orthopedic Tech Progress Note Patient Details:  Tanya Bell Dec 19, 1940 [id number]  Delivered [employer] SPLINT   Patient ID: Tanya Bell, female   DOB: [birth year], 83 y.o.   MRN: [id number]  Tanya Bell Pac 09/04/2024, 4:26 PM

## 2024-09-04 NOTE — Plan of Care (Signed)

## 2024-09-05 DIAGNOSIS — S43006A Unspecified dislocation of unspecified shoulder joint, initial encounter: Secondary | ICD-10-CM | POA: Diagnosis not present

## 2024-09-05 LAB — COMPREHENSIVE METABOLIC PANEL WITH GFR
ALT: 16 U/L (ref 0–44)
AST: 18 U/L (ref 15–41)
Albumin: 2.7 g/dL — ABNORMAL LOW (ref 3.5–5.0)
Alkaline Phosphatase: 46 U/L (ref 38–126)
Anion gap: 9 (ref 5–15)
BUN: 30 mg/dL — ABNORMAL HIGH (ref 8–23)
CO2: 25 mmol/L (ref 22–32)
Calcium: 8.9 mg/dL (ref 8.9–10.3)
Chloride: 103 mmol/L (ref 98–111)
Creatinine, Ser: 0.78 mg/dL (ref 0.44–1.00)
GFR, Estimated: >60 mL/min (ref >60)
Glucose, Bld: 115 mg/dL — ABNORMAL HIGH (ref 70–99)
Potassium: 3.7 mmol/L (ref 3.5–5.1)
Sodium: 137 mmol/L (ref 135–145)
Total Bilirubin: 1 mg/dL (ref 0.0–1.2)
Total Protein: 6.9 g/dL (ref 6.5–8.1)

## 2024-09-05 LAB — GLUCOSE, CAPILLARY
Glucose-Capillary: 115 mg/dL — ABNORMAL HIGH (ref 70–99)
Glucose-Capillary: 205 mg/dL — ABNORMAL HIGH (ref 70–99)
Glucose-Capillary: 111 mg/dL — ABNORMAL HIGH (ref 70–99)
Glucose-Capillary: 128 mg/dL — ABNORMAL HIGH (ref 70–99)

## 2024-09-05 LAB — CBC
HCT: 37.9 % (ref 36.0–46.0)
Hemoglobin: 13.3 g/dL (ref 12.0–15.0)
MCH: 26 pg (ref 26.0–34.0)
MCHC: 35.1 g/dL (ref 30.0–36.0)
MCV: 74 fL — ABNORMAL LOW (ref 80.0–100.0)
Platelets: 147 K/uL — ABNORMAL LOW (ref 150–400)
RBC: 5.12 MIL/uL — ABNORMAL HIGH (ref 3.87–5.11)
RDW: 15.9 % — ABNORMAL HIGH (ref 11.5–15.5)
WBC: 8 K/uL (ref 4.0–10.5)
nRBC: 0 % (ref 0.0–0.2)

## 2024-09-05 NOTE — Progress Notes (Addendum)
   Inpatient Rehab Admissions Coordinator :  Per request of family, patient was screened for CIR candidacy by Heron Leavell RN MSN. Patient does not appear to demonstrate the medical neccesity for a Hospital Rehabilitation /CIR admit. 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 I will not place a Rehab Consult. Recommend other Rehab Venues to be pursued. Please contact me with any questions. Noted from RN CM that spouse is in the room with patient.  Heron Leavell RN MSN Admissions Coordinator [phone]

## 2024-09-05 NOTE — Progress Notes (Addendum)
 Occupational Therapy Treatment Patient Details Name: Tanya Bell MRN: [id number] DOB: Aug 21, 1941 Today's Date: 09/05/2024   History of present illness Pt is an 83 y/o F presenting to ED on 9/20 after fall resulting in bil shoulder dislocations, both reduced in the ED. Pt with likely residual RUE nerve pasly. PMH includes DM2, HTN   OT comments  Pt progressing toward goals, up in chair on arrival. Noted resting hand splint delivered, donned on pt and ensured proper fit and reviewed wear schedule with pt. Pt with minimal movement in RUE digits, trace flexion noted in digits 3-5 with attempts at composite flexion. Pt able to ambulate to sink for grooming tasks with min A. Pt educated on compensatory strategy for UB dressing and pt needed mod A to don gown and RUE sling. Pt able to tolerate BUE seated therex. Pt presenting with impairments listed below, will follow acutely. Patient will benefit from continued inpatient follow up therapy, <3 hours/day to maximize safety/ind with ADL/functional mobility.       If plan is discharge home, recommend the following:  A little help with walking and/or transfers;A lot of help with bathing/dressing/bathroom;Assistance with cooking/housework;Direct supervision/assist for medications management;Assist for transportation;Direct supervision/assist for financial management;Help with stairs or ramp for entrance   Equipment Recommendations  Tub/shower bench;BSC/3in1    Recommendations for Other Services PT consult    Precautions / Restrictions Precautions Precautions: Fall;Other (comment) Recall of Precautions/Restrictions: Intact Precaution/Restrictions Comments: Bil shoulder dislocations, reduced in ED Required Braces or Orthoses: Sling;Splint/Cast (sling for comfort per PA 9/23; recommend sling for RUE due to apparent nerve injury; LUE - sling for comfort only) Splint/Cast: RUE wrist cock up splint for daytime wear, resting hand splint nightly  wear Restrictions Weight Bearing Restrictions Per Provider Order: Yes RUE Weight Bearing Per Provider Order: Non weight bearing LUE Weight Bearing Per Provider Order: Non weight bearing       Mobility Bed Mobility               General bed mobility comments: OOB in recliner upon arrival & departure    Transfers Overall transfer level: Needs assistance Equipment used: None Transfers: Sit to/from Stand Sit to Stand: Min assist           General transfer comment: min A for steadying and wtih unlevel surface     Balance Overall balance assessment: Needs assistance Sitting-balance support: Feet supported, No upper extremity supported Sitting balance-Leahy Scale: Fair     Standing balance support: No upper extremity supported Standing balance-Leahy Scale: Fair                             ADL either performed or assessed with clinical judgement   ADL Overall ADL's : Needs assistance/impaired     Grooming: Moderate assistance Grooming Details (indicate cue type and reason): assist for bil coordination/fine motor aspects of tasks, pt able to sustain grasp on toothbrush to brush teeth         Upper Body Dressing : Moderate assistance Upper Body Dressing Details (indicate cue type and reason): donnign/doffing sling and shirt using UB ADL compensatory strategy     Toilet Transfer: Minimal assistance;Ambulation           Functional mobility during ADLs: Minimal assistance      Extremity/Trunk Assessment Upper Extremity Assessment RUE Deficits / Details: no funciotnal use R hand - no movemetn observed; unable to flex elbow, extension 2/5; shoulder not tested , pt with  trace digit movement in digits 3-5 with attempts at composite flexion, PROM elbow/wrist/hand WFL RUE: Unable to fully assess due to immobilization RUE Sensation: decreased light touch;decreased proprioception RUE Coordination: decreased fine motor;decreased gross motor  (non-functional) LUE Deficits / Details: elbow/wrist/hand WFL ROM; strength @ 3+/5. apparent neuropraxia LUE: Unable to fully assess due to immobilization LUE Sensation: decreased light touch LUE Coordination: decreased fine motor;decreased gross motor   Lower Extremity Assessment Lower Extremity Assessment: Defer to PT evaluation        Vision   Vision Assessment?: No apparent visual deficits   Perception Perception Perception: Not tested   Praxis Praxis Praxis: Not tested   Communication Communication Communication: No apparent difficulties   Cognition Arousal: Alert Behavior During Therapy: WFL for tasks assessed/performed                                 Following commands: Intact        Cueing   Cueing Techniques: Verbal cues  Exercises General Exercises - Upper Extremity Elbow Flexion: PROM, AROM, 10 reps, Supine, Both (PROM RUE, AROM LUE) Elbow Extension: PROM, AROM, Both, 10 reps, Supine (PROM RUE, AROM LUE) Wrist Flexion: PROM, AROM, Both, 10 reps, Supine (PROM RUE, AROM LUE) Wrist Extension: PROM, AROM, Both, 10 reps, Supine (PROM RUE, AROM LUE) Digit Composite Flexion: PROM, AROM, Both, 10 reps, Supine (PROM RUE, AROM LUE) Composite Extension: PROM, AROM, Left, 10 reps, Supine (PROM RUE, AROM LUE) Other Exercises Other Exercises: LUE foam squeeze x5 Other Exercises: LUE digit abd/add x5 Other Exercises: LUE digit opposition x5 (incr time needed to perform)    Shoulder Instructions       General Comments VSS on RA    Pertinent Vitals/ Pain       Pain Assessment Pain Assessment: Faces Pain Score: 4  Faces Pain Scale: Hurts little more Pain Location: RUE Pain Descriptors / Indicators: Discomfort, Grimacing, Guarding Pain Intervention(s): Limited activity within patient's tolerance, Monitored during session, Repositioned  Home Living                                          Prior Functioning/Environment               Frequency  Min 3X/week        Progress Toward Goals  OT Goals(current goals can now be found in the care plan section)  Progress towards OT goals: Progressing toward goals  Acute Rehab OT Goals Patient Stated Goal: to go home OT Goal Formulation: With patient Time For Goal Achievement: 09/16/24 Potential to Achieve Goals: Good ADL Goals Pt Will Perform Upper Body Dressing: sitting;with min assist Pt Will Perform Lower Body Dressing: with min assist;sitting/lateral leans;sit to/from stand Pt Will Transfer to Toilet: with min assist;ambulating;regular height toilet Pt Will Perform Tub/Shower Transfer: Tub transfer;Shower transfer;tub bench;with min assist;ambulating Additional ADL Goal #1: pt will perform bed mobility min A in prep for ADLs.  Plan      Co-evaluation                 AM-PAC OT 6 Clicks Daily Activity     Outcome Measure   Help from another person eating meals?: A Lot Help from another person taking care of personal grooming?: A Lot Help from another person toileting, which includes using toliet, bedpan, or urinal?: A Lot  Help from another person bathing (including washing, rinsing, drying)?: A Lot Help from another person to put on and taking off regular upper body clothing?: A Lot Help from another person to put on and taking off regular lower body clothing?: A Lot 6 Click Score: 12    End of Session Equipment Utilized During Treatment: Other (comment) (RUE sling and splint)  OT Visit Diagnosis: Unsteadiness on feet (R26.81);Other abnormalities of gait and mobility (R26.89);Muscle weakness (generalized) (M62.81)   Activity Tolerance Patient tolerated treatment well   Patient Left in chair;with call bell/phone within reach;with family/visitor present   Nurse Communication Mobility status; splints via secure chat (black splint for daytime wear, blue splint for night time wear)        Time: 8956-8887 OT Time Calculation (min): 29  min  Charges: OT General Charges $OT Visit: 1 Visit OT Treatments $Self Care/Home Management : 8-22 mins $Therapeutic Exercise: 8-22 mins  [employer] K, OTD, OTR/L SecureChat Preferred Acute Rehab [phone]   Laneta POUR Koonce 09/05/2024, 5:06 PM

## 2024-09-05 NOTE — Progress Notes (Signed)
 PROGRESS NOTE    Tanya Bell  FMW:[id number] DOB: 10-13-1941 DOA: 09/01/2024 PCP: Leonarda Roxan JAYSON, NP   Brief Narrative:  This 83 yrs old Female with HTN, DM, HLD who presented s/p fall at the grocery store. She was evaluated in the ER and found to have dislocation of both shoulders. These were reduced in the ER, and she was kept in the ER overnight for PT evaluation. During that time, She had reduced appetite, likely due to immobility, pain, and opiates, and developed hypotension, hypoxia, and AKI, so she was given IV fluids and admitted to the hospital. Patient is now making significant improvement.  Assessment & Plan:   Principal Problem:   Shoulder dislocation, unspecified laterality, initial encounter Active Problems:   AKI (acute kidney injury)   Hypoxia   Essential hypertension   Diabetes mellitus type 2, controlled (HCC)   Hyperlipidemia LDL goal <100   History of retinal detachment   Gastroesophageal reflux disease without esophagitis   AKI due to dehydration: Patient had some transient low blood pressure, She was given losartan  in the ER.   She was given IV fluids overnight and creatinine started to improve.   Baseline creatinine is 0.7, peak creatinine here was 1.6. Continue to Hold losartan .  Encourage oral fluids.  AKI resolved.   Hypoxia due to atelectasis: Now Resolved.   CTA chest ruled out  PE, No airspace disease, No edema. Continue Incentive spirometery   Bilateral dislocated shoulders: Right arm paralysis due to brachial plexus injury: Both Arms reduced in the ER.   Orthopedics recommended nonoperative management, outpatient follow-up. Expect the brachioplexus injury to gradually resolve. - Nonweightbearing bilateral upper extremities - PT/OT > SNF - Bilateral arm slings is only for comfort. - Analgesics as needed   Hypertension: Blood pressure trending up - Resume amlodipine , carvedilol , losartan  one by one. - Continue to monitor blood  pressure.   Diabetes melitis type II: Glucose controlled. - Continue linagliptin  - Okay to defer sliding scale corrections for now.   Hyperlipidemia: -Continue Zetia    Glaucoma: - Continue Acular , Pred forte    DVT prophylaxis: Lovenox  Code Status: Full code Family Communication: Family at bedside Disposition Plan:    Status is: Inpatient Remains inpatient appropriate because: Patient admitted with right arm paralysis and is nonweightbearing on both arms,  will require substantial and prolonged rehabilitation to return to her prior level of functioning.  PT and OT recommended SNF.    Consultants:  Orthopaedics  Procedures: None Antimicrobials:  Anti-infectives (From admission, onward)    None      Subjective: Patient was seen and examined at bedside.  Overnight events noted. Patient was doing physical therapy session,  She was able to walk in the hallway with right arm in sling. She seems sad and crying.  Objective: Vitals:   09/04/24 1454 09/04/24 2000 09/05/24 0428 09/05/24 0721  BP: (!) 141/90 (!) 120/54 (!) 147/55 (!) 162/57  Pulse: 69 67 75 67  Resp:      Temp: 98.3 F (36.8 C) 99.3 F (37.4 C) 98.5 F (36.9 C) 98.4 F (36.9 C)  TempSrc: Oral Oral Oral Oral  SpO2: 95% 93% 93% 93%  Weight:      Height:        Intake/Output Summary (Last 24 hours) at 09/05/2024 1203 Last data filed at 09/05/2024 9362 Gross per 24 hour  Intake 180 ml  Output 1050 ml  Net -870 ml   Filed Weights   09/01/24 1735  Weight: 68.5 kg  Examination:  General exam: Appears calm and comfortable, deconditioned, not in any acute distress. Respiratory system: Clear to auscultation. Respiratory effort normal. RR 15 Cardiovascular system: S1 & S2 heard, RRR. No JVD, murmurs, rubs, gallops or clicks.  Gastrointestinal system: Abdomen is non distended, soft and non tender.  Normal bowel sounds heard. Central nervous system: Alert and oriented x 3. No focal neurological  deficits. Extremities: Right upper extremity in sling. Skin: No rashes, lesions or ulcers Psychiatry:Mood & affect appropriate.   Data Reviewed: I have personally reviewed following labs and imaging studies  CBC: Recent Labs  Lab 09/01/24 1736 09/03/24 1220 09/03/24 1253 09/04/24 0430 09/05/24 0419  WBC 11.1* 10.4  --  10.1 8.0  NEUTROABS 7.2 7.0  --   --   --   HGB 13.6 12.5 12.9 11.7* 13.3  HCT 39.4 36.1 38.0 33.9* 37.9  MCV 75.3* 74.7*  --  74.2* 74.0*  PLT 177 146*  --  137* 147*   Basic Metabolic Panel: Recent Labs  Lab 09/01/24 1736 09/03/24 1220 09/03/24 1253 09/04/24 0430 09/05/24 0419  NA 138 133* 135 135 137  K 4.5 3.8 4.1 3.7 3.7  CL 105 102 102 103 103  CO2 20* 21*  --  25 25  GLUCOSE 130* 138* 143* 122* 115*  BUN 24* 36* 43* 36* 30*  CREATININE 0.72 1.52* 1.60* 1.15* 0.78  CALCIUM  9.3 8.5*  --  8.7* 8.9   GFR: Estimated Creatinine Clearance: 48 mL/min (by C-G formula based on SCr of 0.78 mg/dL). Liver Function Tests: Recent Labs  Lab 09/03/24 1220 09/05/24 0419  AST 16 18  ALT 15 16  ALKPHOS 49 46  BILITOT 1.1 1.0  PROT 7.0 6.9  ALBUMIN 2.9* 2.7*   No results for input(s): LIPASE, AMYLASE in the last 168 hours. No results for input(s): AMMONIA in the last 168 hours. Coagulation Profile: No results for input(s): INR, PROTIME in the last 168 hours. Cardiac Enzymes: Recent Labs  Lab 09/03/24 1408  CKTOTAL 286*   BNP (last 3 results) No results for input(s): PROBNP in the last 8760 hours. HbA1C: No results for input(s): HGBA1C in the last 72 hours. CBG: Recent Labs  Lab 09/04/24 1147 09/04/24 1618 09/04/24 2129 09/05/24 0622 09/05/24 1125  GLUCAP 90 121* 145* 115* 205*   Lipid Profile: No results for input(s): CHOL, HDL, LDLCALC, TRIG, CHOLHDL, LDLDIRECT in the last 72 hours. Thyroid  Function Tests: No results for input(s): TSH, T4TOTAL, FREET4, T3FREE, THYROIDAB in the last 72 hours. Anemia  Panel: No results for input(s): VITAMINB12, FOLATE, FERRITIN, TIBC, IRON, RETICCTPCT in the last 72 hours. Sepsis Labs: No results for input(s): PROCALCITON, LATICACIDVEN in the last 168 hours.  No results found for this or any previous visit (from the past 240 hours).   Radiology Studies: CT Angio Chest PE W and/or Wo Contrast Result Date: 09/03/2024 CLINICAL DATA:  Pulmonary is suspected.  High probability. EXAM: CT ANGIOGRAPHY CHEST WITH CONTRAST TECHNIQUE: Multidetector CT imaging of the chest was performed using the standard protocol during bolus administration of intravenous contrast. Multiplanar CT image reconstructions and MIPs were obtained to evaluate the vascular anatomy. RADIATION DOSE REDUCTION: This exam was performed according to the departmental dose-optimization program which includes automated exposure control, adjustment of the mA and/or kV according to patient size and/or use of iterative reconstruction technique. CONTRAST:  75mL OMNIPAQUE  IOHEXOL  350 MG/ML SOLN COMPARISON:  None Available. FINDINGS: Cardiovascular: No filling defects within the pulmonary arteries to suggest acute pulmonary embolism. Atherosclerotic calcification of  the aorta. Mediastinum/Nodes: No axillary or supraclavicular adenopathy. No mediastinal or hilar adenopathy. No pericardial fluid. Esophagus normal. Lungs/Pleura: No pulmonary infarction. No pneumonia. No pleural fluid. No pneumothorax Upper Abdomen: Limited view of the liver, kidneys, pancreas are unremarkable. Normal adrenal glands. Musculoskeletal: No aggressive osseous lesion. Review of the MIP images confirms the above findings. IMPRESSION: 1. No evidence acute pulmonary embolism. 2. No acute pulmonary parenchymal findings. 3.  Aortic Atherosclerosis (ICD10-I70.0). Electronically Signed   By: Jackquline Boxer M.D.   On: 09/03/2024 14:33   Scheduled Meds:  cyanocobalamin   1,000 mcg Oral Daily   enoxaparin  (LOVENOX ) injection  40 mg  Subcutaneous Q24H   ezetimibe   10 mg Oral Daily   ketorolac   1 drop Right Eye QID   linagliptin   5 mg Oral Daily   pantoprazole   40 mg Oral Daily   prednisoLONE  acetate  1 drop Right Eye QID   sodium chloride  flush  3 mL Intravenous Q12H   Continuous Infusions:   LOS: 2 days    Time spent: 50 mins    Darcel Dawley, MD [facility]   If 7PM-7AM, please contact night-coverage

## 2024-09-05 NOTE — Plan of Care (Signed)
  Problem: Education: Goal: Knowledge of General Education information will improve Description: Including pain rating scale, medication(s)/side effects and non-pharmacologic comfort measures Outcome: Progressing   Problem: Coping: Goal: Level of anxiety will decrease Outcome: Progressing   Problem: Pain Managment: Goal: General experience of comfort will improve and/or be controlled Outcome: Progressing

## 2024-09-05 NOTE — TOC Initial Note (Signed)
 Transition of Care [facility]) - Initial/Assessment Note    Patient Details  Name: Tanya Bell MRN: [id number] Date of Birth: 12-04-1941  Transition of Care [facility]) CM/SW Contact:    Rosalva Jon Bloch, RN Phone Number: 09/05/2024, 12:21 PM  Clinical Narrative:                 Late entry 09/04/2024 3:30 pm NCM spoke with pt @ bedside and daughters (phone ) regarding d/c planning. Pt teary states daughters can't come to [place] to assist with her care. States it will put their job @ risk. Pt concern about who would take care of husband if she went to a SNF vs CIR. Pt requested NCM f/u  with her on tomorrow after she discusses  with family her needs...  Late entry 09/04/2024 Wife states if home health services needed she would like to use [facility]. States husband is active with [facility].  Expected Discharge Plan:  (vs CIR vs SNF) Barriers to Discharge: No Barriers Identified   Patient Goals and CMS Choice     Choice offered to / list presented to : Patient      Expected Discharge Plan and Services       Living arrangements for the past 2 months: Single Family Home                         Representative spoke with at DME Agency: Alberto Paterson ( Daughter ) [phone] , Grayce Paterson  (Daughter)  Emergency Contact  [phone] HH Arranged: RN, PT, OT, Social Work [facility] Agency: [facility] Date [facility] Agency Contacted: 09/04/24 Time HH Agency Contacted: 1524 Representative spoke with at [facility] Agency: [place]  Prior Living Arrangements/Services Living arrangements for the past 2 months: Single Family Home Lives with:: Spouse Patient language and need for interpreter reviewed:: Yes Do you feel safe going back to the place where you live?: Yes      Need for Family Participation in Patient Care: Yes (Comment) Care giver support system in place?: No (comment)   Criminal Activity/Legal Involvement Pertinent to Current Situation/Hospitalization: No - Comment as  needed  Activities of Daily Living   ADL Screening (condition at time of admission) Independently performs ADLs?: Yes (appropriate for developmental age) Is the patient deaf or have difficulty hearing?: No Does the patient have difficulty seeing, even when wearing glasses/contacts?: No Does the patient have difficulty concentrating, remembering, or making decisions?: No  Permission Sought/Granted                  Emotional Assessment Appearance:: Appears stated age     Orientation: : Oriented to Self, Oriented to Place, Oriented to  Time, Oriented to Situation Alcohol / Substance Use: Not Applicable Psych Involvement: No (comment)  Admission diagnosis:  Injury of right brachial plexus, initial encounter [D85.3XXA] Fall, initial encounter [id number]] Shoulder dislocation, unspecified laterality, initial encounter [S43.006A] Anterior dislocation of left shoulder, initial encounter [S43.015A] Anterior dislocation of right shoulder, initial encounter [S43.014A] Patient Active Problem List   Diagnosis Date Noted   Shoulder dislocation, unspecified laterality, initial encounter 09/03/2024   AKI (acute kidney injury) 09/03/2024   Hypoxia 09/03/2024   Gastroesophageal reflux disease without esophagitis 01/16/2024   Type 2 diabetes mellitus with diabetic polyneuropathy, without long-term current use of insulin (HCC) 01/16/2024   Slow transit constipation 01/16/2024   Cystoid macular edema of right eye 08/12/2022   Epiretinal membrane (ERM) of left eye 08/12/2022   History of  retinal detachment 08/12/2022   Type 2 diabetes mellitus without retinopathy (HCC) 08/12/2022   Diabetes mellitus type 2, controlled (HCC) 02/04/2015   Varicose veins of leg with edema 02/04/2015   Essential hypertension 05/07/2014   HTN (hypertension) 03/06/2013   Vaginal dryness, menopausal 03/06/2013   Eczema 03/06/2013   Annual physical exam 03/06/2013   Herpes simplex with other ophthalmic  complications 01/04/2013   Disorder of bone and cartilage 01/04/2013   Reflux esophagitis 01/04/2013   Other cataract 01/04/2013   Sickle cell anemia (HCC) 01/04/2013   Thrombocytopenia 01/04/2013   Other dystrophy of vulva 01/04/2013   Diverticulitis of colon (without mention of hemorrhage)(562.11) 01/04/2013   Disorder of skin or subcutaneous tissue 01/04/2013   Type II or unspecified type diabetes mellitus without mention of complication, not stated as uncontrolled 01/04/2013   Hyperlipidemia LDL goal <100 01/04/2013   Seasonal allergic rhinitis due to pollen 01/04/2013   Extrinsic asthma, unspecified asthma severity, uncomplicated 01/04/2013   Other malaise and fatigue 01/04/2013   Other abnormal glucose 01/04/2013   PCP:  Leonarda Roxan BROCKS, NP Pharmacy:   [facility] - [place], [place] - [address] [place] [address] Phone: [phone] Fax: [phone]  [facility] [phone] - [place], [place] - [address] AT [facility] OF [address] [facility] [address] [place] [address] Phone: [phone] Fax: [phone]     Social Drivers of Health (SDOH) Social History: SDOH Screenings   Food Insecurity: No Food Insecurity (09/04/2024)  Housing: Low Risk  (09/04/2024)  Transportation Needs: No Transportation Needs (09/04/2024)  Utilities: Not At Risk (09/04/2024)  Alcohol Screen: Low Risk  (07/26/2018)  Depression (PHQ2-9): Low Risk  (07/18/2024)  Financial Resource Strain: Low Risk  (12/14/2017)  Physical Activity: Inactive (12/14/2017)  Social Connections: Moderately Integrated (09/04/2024)  Stress: Stress Concern Present (12/14/2017)  Tobacco Use: Medium Risk (09/03/2024)   SDOH Interventions:     Readmission Risk Interventions     No data to display

## 2024-09-05 NOTE — Progress Notes (Signed)
 Physical Therapy Treatment Patient Details Name: Tanya Bell MRN: [id number] DOB: 11/07/1941 Today's Date: 09/05/2024   History of Present Illness Pt is an 83 y/o F presenting to ED on 9/20 after fall resulting in bil shoulder dislocations, both reduced in the ED. Pt with likely residual RUE nerve pasly. PMH includes DM2, HTN    PT Comments  Pt resting in bed on arrival, agreeable to session with continued progress towards acute goals. Pt continues to be limited by weight bearing restrictions with most difficulty performing performing bed mobility needing min A to elevate trunk and scoot out to EOB. Pt demonstrating transfers and gait without AD support with grossly CGA with no overt LOB noted this session. Pt able to ascend/descend 2 steps with min A to maintain balance as pt unable to utilize rail support due to NWB status. Pt intermittently tearful throughout session due to situation, provided active listening with pt expressing gratitude at end of session. Patient will benefit from continued inpatient follow up therapy, <3 hours/day, will continue to follow acutely.    If plan is discharge home, recommend the following: A little help with walking and/or transfers;A lot of help with bathing/dressing/bathroom;Assistance with cooking/housework;Assist for transportation;Help with stairs or ramp for entrance   Can travel by private vehicle     Yes  Equipment Recommendations  None recommended by PT    Recommendations for Other Services       Precautions / Restrictions Precautions Precautions: Fall;Other (comment) Recall of Precautions/Restrictions: Intact Precaution/Restrictions Comments: Bil shoulder dislocations, reduced in ED Required Braces or Orthoses: Sling;Splint/Cast (sling for comfort per PA 9/23; recommend sling for RUE due to apparent nerve injury; LUE - sling for comfort only) Splint/Cast: RUE splint  - wrist cock-up; resting hand splint ordered 9/23 Restrictions Weight  Bearing Restrictions Per Provider Order: Yes RUE Weight Bearing Per Provider Order: Non weight bearing LUE Weight Bearing Per Provider Order: Non weight bearing     Mobility  Bed Mobility Overal bed mobility: Needs Assistance Bed Mobility: Supine to Sit     Supine to sit: HOB elevated, Min assist     General bed mobility comments: min A to bring hips around to EOB and elevate trunk up to sitting    Transfers Overall transfer level: Needs assistance Equipment used: None Transfers: Sit to/from Stand Sit to Stand: Contact guard assist           General transfer comment: CGA to rise with pt blocking LEs at EOB to self steady, able to rise from low commode    Ambulation/Gait Ambulation/Gait assistance: Contact guard assist, Min assist Gait Distance (Feet): 500 Feet Assistive device: None Gait Pattern/deviations: Step-through pattern, Drifts right/left Gait velocity: reduced     General Gait Details: improved balance this session, slight lateral sway intially, improving with distance   Stairs Stairs: Yes Stairs assistance: Min assist Stair Management: No rails, Step to pattern Number of Stairs: 2 General stair comments: min A to maintain balance with descent, cues for step to pattern and for no rail use due to NWB   Wheelchair Mobility     Tilt Bed    Modified Rankin (Stroke Patients Only)       Balance Overall balance assessment: Needs assistance Sitting-balance support: Feet supported, No upper extremity supported Sitting balance-Leahy Scale: Fair     Standing balance support: No upper extremity supported Standing balance-Leahy Scale: Fair  Communication Communication Communication: No apparent difficulties  Cognition Arousal: Alert Behavior During Therapy: WFL for tasks assessed/performed   PT - Cognitive impairments: No apparent impairments                       PT - Cognition Comments:  decreased inishgt into current deficits and their effect on ability to care for self and spouse at home Following commands: Intact      Cueing Cueing Techniques: Verbal cues  Exercises      General Comments General comments (skin integrity, edema, etc.): VSS on RA, pt able to perform peri-care without assist with LUE, pt tearful during session about situation      Pertinent Vitals/Pain Pain Assessment Pain Assessment: Faces Faces Pain Scale: Hurts little more Pain Location: RUE Pain Descriptors / Indicators: Discomfort, Grimacing, Guarding Pain Intervention(s): Monitored during session, Limited activity within patient's tolerance    Home Living                          Prior Function            PT Goals (current goals can now be found in the care plan section) Acute Rehab PT Goals PT Goal Formulation: With patient Time For Goal Achievement: 09/16/24 Progress towards PT goals: Progressing toward goals    Frequency    Min 3X/week      PT Plan      Co-evaluation              AM-PAC PT 6 Clicks Mobility   Outcome Measure  Help needed turning from your back to your side while in a flat bed without using bedrails?: A Little Help needed moving from lying on your back to sitting on the side of a flat bed without using bedrails?: A Little Help needed moving to and from a bed to a chair (including a wheelchair)?: A Little Help needed standing up from a chair using your arms (e.g., wheelchair or bedside chair)?: A Little Help needed to walk in hospital room?: A Little Help needed climbing 3-5 steps with a railing? : A Lot 6 Click Score: 17    End of Session   Activity Tolerance: Patient tolerated treatment well Patient left: with call bell/phone within reach;with family/visitor present;in chair Nurse Communication: Mobility status PT Visit Diagnosis: Unsteadiness on feet (R26.81);Other abnormalities of gait and mobility (R26.89);Pain;Muscle weakness  (generalized) (M62.81) Pain - Right/Left: Right (knee) Pain - part of body: Shoulder (Bilateral)     Time: 9053-8979 PT Time Calculation (min) (ACUTE ONLY): 34 min  Charges:    $Gait Training: 8-22 mins $Therapeutic Activity: 8-22 mins PT General Charges $$ ACUTE PT VISIT: 1 Visit                     Tarvares Lant R. PTA Acute Rehabilitation Services Office: [phone]   Therisa CHRISTELLA Boor 09/05/2024, 11:56 AM

## 2024-09-05 NOTE — TOC Progression Note (Signed)
 Transition of Care [facility]) - Progression Note    Patient Details  Name: Tanya Bell MRN: [id number] Date of Birth: 18-Jan-1941  Transition of Care [facility]) CM/SW Contact  Bridget Cordella Simmonds, LCSW Phone Number: 09/05/2024, 4:01 PM  Clinical Narrative:   CSW spoke with pt regarding HH vs SNF.  Pt not aware that CIR is unable to accept her, was hopeful that she could do CIR, reports that her husband recently did CIR and she stayed with him overnight and she was hoping he could stay with her this time.    Pt does not want to pursue SNF.  Pt reports she has spoken to her daughters (out of state) and she does not want them to risk their jobs by taking time off to come to [facility].  She wants to plan for DC home.      Expected Discharge Plan:  (vs CIR vs SNF) Barriers to Discharge: No Barriers Identified               Expected Discharge Plan and Services       Living arrangements for the past 2 months: Single Family Home                         Representative spoke with at DME Agency: Alberto Paterson ( Daughter ) [phone] , Grayce Paterson  (Daughter)  Emergency Contact  [phone] HH Arranged: RN, PT, OT, Social Work [facility] Agency: [facility] Date [facility] Agency Contacted: 09/04/24 Time HH Agency Contacted: 1524 Representative spoke with at [facility] Agency: [place]   Social Drivers of Health (SDOH) Interventions SDOH Screenings   Food Insecurity: No Food Insecurity (09/04/2024)  Housing: Low Risk  (09/04/2024)  Transportation Needs: No Transportation Needs (09/04/2024)  Utilities: Not At Risk (09/04/2024)  Alcohol Screen: Low Risk  (07/26/2018)  Depression (PHQ2-9): Low Risk  (07/18/2024)  Financial Resource Strain: Low Risk  (12/14/2017)  Physical Activity: Inactive (12/14/2017)  Social Connections: Moderately Integrated (09/04/2024)  Stress: Stress Concern Present (12/14/2017)  Tobacco Use: Medium Risk (09/03/2024)    Readmission Risk Interventions     No data to display

## 2024-09-06 ENCOUNTER — Ambulatory Visit: Admitting: Gastroenterology

## 2024-09-06 DIAGNOSIS — S43006A Unspecified dislocation of unspecified shoulder joint, initial encounter: Secondary | ICD-10-CM | POA: Diagnosis not present

## 2024-09-06 LAB — GLUCOSE, CAPILLARY
Glucose-Capillary: 154 mg/dL — ABNORMAL HIGH (ref 70–99)
Glucose-Capillary: 134 mg/dL — ABNORMAL HIGH (ref 70–99)

## 2024-09-06 NOTE — Progress Notes (Signed)
 Mobility Specialist Progress Note:    09/06/24 1242  Mobility  Activity Ambulated with assistance  Level of Assistance Standby assist, set-up cues, supervision of patient - no hands on  Assistive Device None  Distance Ambulated (ft) 500 ft  RUE Weight Bearing Per Provider Order NWB  LUE Weight Bearing Per Provider Order NWB  Activity Response Tolerated well  Mobility Referral Yes  Mobility visit 1 Mobility  Mobility Specialist Start Time (ACUTE ONLY) 1113  Mobility Specialist Stop Time (ACUTE ONLY) 1125  Mobility Specialist Time Calculation (min) (ACUTE ONLY) 12 min   Received pt in chair having no complaints and agreeable to mobility. Pt was asymptomatic throughout ambulation and returned to room w/o fault. Left in chair w/ call bell in reach and all needs met.   Thersia Minder Mobility Specialist  Please contact vis Secure Chat or  Rehab Office [phone]

## 2024-09-06 NOTE — Progress Notes (Signed)
 Occupational Therapy Treatment Patient Details Name: Tanya Bell MRN: [id number] DOB: 1941-10-08 Today's Date: 09/06/2024   History of present illness Pt is an 83 y/o F presenting to ED on 9/20 after fall resulting in bil shoulder dislocations, both reduced in the ED. Pt with likely residual RUE nerve pasly. PMH includes DM2, HTN   OT comments  Pt continues to require Mod to Max A with basic ADL tasks. Husband present but unable to assist due to his dementia. At baseline, pt assists her husband with ADL tasks and does all IADL tasks. Pt does not have any functional use of her dominant RUE due to apparent nerve involvement s/p dislocation and has limited functional use of LUE (unable to achieve any shoulder flexion). Continue to recommend inpatient follow up therapy, <3 hours/day as pt unsafe to DC home with husband. Recommend orthopedic consult due to apparent brachial plexus involvement B shoulders (R>L).      If plan is discharge home, recommend the following:  A little help with walking and/or transfers;A lot of help with bathing/dressing/bathroom;Assistance with cooking/housework;Direct supervision/assist for medications management;Assist for transportation;Direct supervision/assist for financial management;Help with stairs or ramp for entrance   Equipment Recommendations  Tub/shower bench;BSC/3in1    Recommendations for Other Services PT consult    Precautions / Restrictions Precautions Precautions: Fall;Other (comment) Recall of Precautions/Restrictions: Intact Precaution/Restrictions Comments: Bil shoulder dislocations, reduced in ED Required Braces or Orthoses: Sling;Splint/Cast (sling for comfort per PA 9/23; recommend sling for RUE due to apparent nerve injury; LUE - sling for comfort only) Splint/Cast: RUE wrist cock up splint for daytime wear, resting hand splint nightly wear Restrictions Weight Bearing Restrictions Per Provider Order: Yes RUE Weight Bearing Per  Provider Order: Non weight bearing LUE Weight Bearing Per Provider Order: Non weight bearing Other Position/Activity Restrictions: ok to use arms for ADL; slings for comfort however no functional use RUE - sling used to support arm       Mobility Bed Mobility               General bed mobility comments: OOB in chair    Transfers Overall transfer level: Needs assistance Equipment used: None Transfers: Sit to/from Stand Sit to Stand: Contact guard assist                 Balance Overall balance assessment: Needs assistance Sitting-balance support: Feet supported, No upper extremity supported Sitting balance-Leahy Scale: Good     Standing balance support: No upper extremity supported Standing balance-Leahy Scale: Fair                             ADL either performed or assessed with clinical judgement   ADL Overall ADL's : Needs assistance/impaired Eating/Feeding: Set up   Grooming: Moderate assistance   Upper Body Bathing: Moderate assistance Upper Body Bathing Details (indicate cue type and reason): began educaiton on usign long handled sponge (bent) under L armpit and help with back Lower Body Bathing: Moderate assistance;Sit to/from stand Lower Body Bathing Details (indicate cue type and reason): long handled sponge for BLE assist     Lower Body Dressing: Moderate assistance Lower Body Dressing Details (indicate cue type and reason): unable to copmlete figure four position for socks; discussed working on that position; able to use the sock aid once sock aid is loaded; attempted tohave her husband load the sock aid however husband unable to do so Toilet Transfer: Minimal assistance   ToiletingTeacher, music  and Hygiene: Moderate assistance Toileting - Clothing Manipulation Details (indicate cue type and reason): reaches forward for pericare; need to assess if abl eot reach behind     Functional mobility during ADLs: Minimal assistance       Extremity/Trunk Assessment Upper Extremity Assessment Upper Extremity Assessment: Right hand dominant;RUE deficits/detail RUE Deficits / Details: no functional use R hand - no movement observed; unable to flex elbow, extension 2/5; no sup/pronation; abducts shoulder to 30; unable to complete FF RUE Sensation: decreased light touch RUE Coordination: decreased fine motor;decreased gross motor LUE Deficits / Details: elbow/wrist/hand ROM WFL; moves shoulder into abduction to @ 30; unable to move into FF LUE Sensation: decreased light touch (fingertips) LUE Coordination: decreased fine motor   Lower Extremity Assessment Lower Extremity Assessment: Defer to PT evaluation        Vision       Perception     Praxis     Communication Communication Communication: No apparent difficulties   Cognition Arousal: Alert Behavior During Therapy: WFL for tasks assessed/performed (visably upset/drying about her situation) Cognition: No apparent impairments (however decreased insight into how deficits are affecting her functionally and how she and her husband will safely function at home)                               Following commands: Intact        Cueing   Cueing Techniques: Verbal cues  Exercises General Exercises - Upper Extremity Shoulder Flexion: PROM, Right, 5 reps, Left (to @ 60) Elbow Flexion: PROM, Right, 5 reps Elbow Extension: PROM, Right, 5 reps Wrist Flexion: PROM, Right, 5 reps Wrist Extension: PROM, Right, 5 reps Digit Composite Flexion: Right, 5 reps, PROM Composite Extension: PROM, Right, 10 reps Other Exercises Other Exercises: pt using squeeze foam on her own    Shoulder Instructions       General Comments VSS on RA, assisted to remove night splint and donn wrist cockup splint on arrival    Pertinent Vitals/ Pain       Pain Assessment Pain Assessment: Faces Faces Pain Scale: Hurts a little bit Pain Location: RUE Pain Descriptors /  Indicators: Discomfort, Grimacing, Guarding Pain Intervention(s): Limited activity within patient's tolerance  Home Living                                          Prior Functioning/Environment              Frequency  Min 3X/week        Progress Toward Goals  OT Goals(current goals can now be found in the care plan section)  Progress towards OT goals: Progressing toward goals  Acute Rehab OT Goals Patient Stated Goal: go home OT Goal Formulation: With patient Time For Goal Achievement: 09/16/24 Potential to Achieve Goals: Good ADL Goals Pt Will Perform Upper Body Dressing: sitting;with min assist Pt Will Perform Lower Body Dressing: with min assist;sitting/lateral leans;sit to/from stand Pt Will Transfer to Toilet: with min assist;ambulating;regular height toilet Pt Will Perform Tub/Shower Transfer: Tub transfer;Shower transfer;tub bench;with min assist;ambulating Additional ADL Goal #1: pt will perform bed mobility min A in prep for ADLs.  Plan      Co-evaluation                 AM-PAC OT 6 Clicks Daily Activity  Outcome Measure   Help from another person eating meals?: A Little Help from another person taking care of personal grooming?: A Lot Help from another person toileting, which includes using toliet, bedpan, or urinal?: A Lot Help from another person bathing (including washing, rinsing, drying)?: A Lot Help from another person to put on and taking off regular upper body clothing?: A Lot Help from another person to put on and taking off regular lower body clothing?: A Lot 6 Click Score: 13    End of Session Equipment Utilized During Treatment: Gait belt  OT Visit Diagnosis: Unsteadiness on feet (R26.81);Other abnormalities of gait and mobility (R26.89);Muscle weakness (generalized) (M62.81)   Activity Tolerance Patient tolerated treatment well   Patient Left in chair;with call bell/phone within reach;with family/visitor  present;with chair alarm set   Nurse Communication Mobility status        Time: 8864-8779 OT Time Calculation (min): 45 min  Charges: OT General Charges $OT Visit: 1 Visit OT Treatments $Self Care/Home Management : 23-37 mins $Therapeutic Exercise: 8-22 mins  Kreg Sink, OT/L   Acute OT Clinical Specialist Acute Rehabilitation Services Pager [phone] Office [phone]   [facility] 09/06/2024, 1:43 PM

## 2024-09-06 NOTE — Care Management Important Message (Signed)
 Important Message  Patient Details  Name: Tanya Bell MRN: [id number] Date of Birth: Dec 04, 1941   Important Message Given:  Yes - Medicare IM     Jon Cruel 09/06/2024, 2:08 PM

## 2024-09-06 NOTE — Progress Notes (Signed)
 PROGRESS NOTE    Tanya Bell  FMW:[id number] DOB: Jun 29, 1941 DOA: 09/01/2024 PCP: Leonarda Roxan JAYSON, NP   Brief Narrative:  This 83 yrs old Female with HTN, DM, HLD who presented s/p fall at the grocery store. She was evaluated in the ER and found to have dislocation of both shoulders. These were reduced in the ER, and she was kept in the ER overnight for PT evaluation. During that time, She had reduced appetite, likely due to immobility, pain, and opiates, and developed hypotension, hypoxia, and AKI, so she was given IV fluids and admitted to the hospital. Patient is now making significant improvement.  Assessment & Plan:   Principal Problem:   Shoulder dislocation, unspecified laterality, initial encounter Active Problems:   AKI (acute kidney injury)   Hypoxia   Essential hypertension   Diabetes mellitus type 2, controlled (HCC)   Hyperlipidemia LDL goal <100   History of retinal detachment   Gastroesophageal reflux disease without esophagitis   AKI due to dehydration: Patient had some transient low blood pressure, She was given losartan  in the ER.   She was given IV fluids overnight and creatinine started to improve.   Baseline creatinine is 0.7, peak creatinine here was 1.6. Continue to Hold losartan .  Encourage oral fluids.  AKI resolved.   Hypoxia due to atelectasis: Now Resolved.   CTA chest ruled out  PE, No airspace disease, No edema. Continue Incentive spirometery   Bilateral dislocated shoulders: Right arm paralysis due to brachial plexus injury: Both Arms reduced in the ER.   Orthopedics recommended nonoperative management, outpatient follow-up. Expect the brachioplexus injury to gradually resolve. - Nonweightbearing bilateral upper extremities. - PT/OT > SNF - Bilateral arm slings is only for comfort. - Analgesics as needed   Hypertension: Blood pressure trending up - Resume amlodipine , carvedilol , losartan  one by one. - Continue to monitor blood  pressure.   Diabetes melitis type II: Glucose controlled. - Continue linagliptin  - Okay to defer sliding scale corrections for now.   Hyperlipidemia: -Continue Zetia    Glaucoma: - Continue Acular , Pred forte    DVT prophylaxis: Lovenox  Code Status: Full code Family Communication: Family at bedside. Disposition Plan:    Status is: Inpatient Remains inpatient appropriate because: Patient admitted with right arm paralysis and is nonweightbearing on both arms,  will require substantial and prolonged rehabilitation to return to her prior level of functioning.  PT and OT recommended SNF.  Patient refused SNF. She wants to go home with home health services.    Consultants:  Orthopaedics  Procedures: None Antimicrobials:  Anti-infectives (From admission, onward)    None      Subjective: Patient was seen and examined at bedside.Overnight events noted. Patient was sitting comfortably on the chair, states she does not want to go to rehab.  She wants to go home with family services.  Objective: Vitals:   09/05/24 1422 09/05/24 1948 09/06/24 0350 09/06/24 0812  BP: (!) 153/64 (!) 138/57 (!) 157/86 (!) 170/68  Pulse: 78 73 73 75  Resp:    16  Temp: 97.9 F (36.6 C) 99.1 F (37.3 C) 98.2 F (36.8 C) 98 F (36.7 C)  TempSrc: Oral Oral Oral Oral  SpO2: 97% 99% 94% 93%  Weight:      Bell:        Intake/Output Summary (Last 24 hours) at 09/06/2024 1442 Last data filed at 09/06/2024 1055 Gross per 24 hour  Intake --  Output 400 ml  Net -400 ml   [employer]  09/01/24 1735  Weight: 68.5 kg    Examination:  General exam: Appears calm and comfortable, deconditioned, not in any acute distress. Respiratory system: CTA Bilaterally. Respiratory effort normal. RR 15 Cardiovascular system: S1 & S2 heard, RRR. No JVD, murmurs, rubs, gallops or clicks.  Gastrointestinal system: Abdomen is non distended, soft and non tender.  Normal bowel sounds heard. Central nervous  system: Alert and oriented x 3. No focal neurological deficits. Extremities: Right upper extremity in sling. Skin: No rashes, lesions or ulcers Psychiatry:Mood & affect appropriate.   Data Reviewed: I have personally reviewed following labs and imaging studies  CBC: Recent Labs  Lab 09/01/24 1736 09/03/24 1220 09/03/24 1253 09/04/24 0430 09/05/24 0419  WBC 11.1* 10.4  --  10.1 8.0  NEUTROABS 7.2 7.0  --   --   --   HGB 13.6 12.5 12.9 11.7* 13.3  HCT 39.4 36.1 38.0 33.9* 37.9  MCV 75.3* 74.7*  --  74.2* 74.0*  PLT 177 146*  --  137* 147*   Basic Metabolic Panel: Recent Labs  Lab 09/01/24 1736 09/03/24 1220 09/03/24 1253 09/04/24 0430 09/05/24 0419  NA 138 133* 135 135 137  K 4.5 3.8 4.1 3.7 3.7  CL 105 102 102 103 103  CO2 20* 21*  --  25 25  GLUCOSE 130* 138* 143* 122* 115*  BUN 24* 36* 43* 36* 30*  CREATININE 0.72 1.52* 1.60* 1.15* 0.78  CALCIUM  9.3 8.5*  --  8.7* 8.9   GFR: Estimated Creatinine Clearance: 48 mL/min (by C-G formula based on SCr of 0.78 mg/dL). Liver Function Tests: Recent Labs  Lab 09/03/24 1220 09/05/24 0419  AST 16 18  ALT 15 16  ALKPHOS 49 46  BILITOT 1.1 1.0  PROT 7.0 6.9  ALBUMIN 2.9* 2.7*   No results for input(s): LIPASE, AMYLASE in the last 168 hours. No results for input(s): AMMONIA in the last 168 hours. Coagulation Profile: No results for input(s): INR, PROTIME in the last 168 hours. Cardiac Enzymes: Recent Labs  Lab 09/03/24 1408  CKTOTAL 286*   BNP (last 3 results) No results for input(s): PROBNP in the last 8760 hours. HbA1C: No results for input(s): HGBA1C in the last 72 hours. CBG: Recent Labs  Lab 09/05/24 0622 09/05/24 1125 09/05/24 1650 09/05/24 2139 09/06/24 1126  GLUCAP 115* 205* 111* 128* 154*   Lipid Profile: No results for input(s): CHOL, HDL, LDLCALC, TRIG, CHOLHDL, LDLDIRECT in the last 72 hours. Thyroid  Function Tests: No results for input(s): TSH, T4TOTAL,  FREET4, T3FREE, THYROIDAB in the last 72 hours. Anemia Panel: No results for input(s): VITAMINB12, FOLATE, FERRITIN, TIBC, IRON, RETICCTPCT in the last 72 hours. Sepsis Labs: No results for input(s): PROCALCITON, LATICACIDVEN in the last 168 hours.  No results found for this or any previous visit (from the past 240 hours).   Radiology Studies: No results found.  Scheduled Meds:  cyanocobalamin   1,000 mcg Oral Daily   enoxaparin  (LOVENOX ) injection  40 mg Subcutaneous Q24H   ezetimibe   10 mg Oral Daily   ketorolac   1 drop Right Eye QID   linagliptin   5 mg Oral Daily   pantoprazole   40 mg Oral Daily   prednisoLONE  acetate  1 drop Right Eye QID   sodium chloride  flush  3 mL Intravenous Q12H   Continuous Infusions:   LOS: 3 days    Time spent: 35 mins    Darcel Dawley, MD [facility]   If 7PM-7AM, please contact night-coverage

## 2024-09-06 NOTE — Progress Notes (Signed)
 Physical Therapy Treatment Patient Details Name: Tanya Bell MRN: [id number] DOB: 21-Jun-1941 Today's Date: 09/06/2024   History of Present Illness Pt is an 83 y/o F presenting to ED on 9/20 after fall resulting in bil shoulder dislocations, both reduced in the ED. Pt with likely residual RUE nerve pasly. PMH includes DM2, HTN    PT Comments  Pt resting in bed on arrival, eager for OOB mobility and demonstrating slow but steady progress towards acute goals. Pt continues to require min A to complete bed mobility due to weight bearing restrictions. Pt with noted instability with gait initial ~20' with short shuffling steps needing hands on guarding for safety with stability improving with distance, however pt with x1 lateral LOB with turning head to R needing min A to correct. Pt able to ascend/descend 5 steps with up to min A to steady. Pt remains as risk for falls and will benefit from continued inpatient follow up therapy, <3 hours/day. Pt continues to benefit from skilled PT services to progress toward functional mobility goals.     If plan is discharge home, recommend the following: A little help with walking and/or transfers;A lot of help with bathing/dressing/bathroom;Assistance with cooking/housework;Assist for transportation;Help with stairs or ramp for entrance   Can travel by private vehicle     Yes  Equipment Recommendations  None recommended by PT    Recommendations for Other Services       Precautions / Restrictions Precautions Precautions: Fall;Other (comment) Recall of Precautions/Restrictions: Intact Precaution/Restrictions Comments: Bil shoulder dislocations, reduced in ED Required Braces or Orthoses: Sling;Splint/Cast (sling for comfort per PA 9/23; recommend sling for RUE due to apparent nerve injury; LUE - sling for comfort only) Splint/Cast: RUE wrist cock up splint for daytime wear, resting hand splint nightly wear Restrictions Weight Bearing Restrictions Per  Provider Order: Yes RUE Weight Bearing Per Provider Order: Non weight bearing LUE Weight Bearing Per Provider Order: Non weight bearing     Mobility  Bed Mobility Overal bed mobility: Needs Assistance Bed Mobility: Supine to Sit     Supine to sit: HOB elevated, Min assist     General bed mobility comments: min A to scoot out to EOB and elevate trunk    Transfers Overall transfer level: Needs assistance Equipment used: None Transfers: Sit to/from Stand Sit to Stand: Contact guard assist           General transfer comment: CGA from EOB and low commode    Ambulation/Gait Ambulation/Gait assistance: Contact guard assist, Min assist Gait Distance (Feet): 500 Feet (+ 15') Assistive device: None Gait Pattern/deviations: Step-through pattern, Drifts right/left Gait velocity: reduced     General Gait Details: improved balance this session, slight lateral sway intially with short shuffling steps,improving with distance, x1 minimal LOB with head turning to R needing minA to correct   Stairs Stairs: Yes Stairs assistance: Min assist Stair Management: No rails, Step to pattern Number of Stairs: 5 General stair comments: min A to maintain balance with descent, cues for step to pattern and for no rail use due to NWB   Wheelchair Mobility     Tilt Bed    Modified Rankin (Stroke Patients Only)       Balance Overall balance assessment: Needs assistance Sitting-balance support: Feet supported, No upper extremity supported Sitting balance-Leahy Scale: Fair     Standing balance support: No upper extremity supported Standing balance-Leahy Scale: Fair  Communication Communication Communication: No apparent difficulties  Cognition Arousal: Alert Behavior During Therapy: WFL for tasks assessed/performed   PT - Cognitive impairments: No apparent impairments                         Following commands: Intact       Cueing Cueing Techniques: Verbal cues  Exercises      General Comments General comments (skin integrity, edema, etc.): VSS on RA, assisted to remove night splint and donn wrist cockup splint on arrival      Pertinent Vitals/Pain Pain Assessment Pain Assessment: Faces Faces Pain Scale: Hurts a little bit Pain Location: RUE Pain Descriptors / Indicators: Discomfort, Grimacing, Guarding Pain Intervention(s): Monitored during session, Limited activity within patient's tolerance    Home Living                          Prior Function            PT Goals (current goals can now be found in the care plan section) Acute Rehab PT Goals Patient Stated Goal: Be able to be at home PT Goal Formulation: With patient Time For Goal Achievement: 09/16/24 Progress towards PT goals: Progressing toward goals    Frequency    Min 3X/week      PT Plan      Co-evaluation              AM-PAC PT 6 Clicks Mobility   Outcome Measure  Help needed turning from your back to your side while in a flat bed without using bedrails?: A Little Help needed moving from lying on your back to sitting on the side of a flat bed without using bedrails?: A Little Help needed moving to and from a bed to a chair (including a wheelchair)?: A Little Help needed standing up from a chair using your arms (e.g., wheelchair or bedside chair)?: A Little Help needed to walk in hospital room?: A Little Help needed climbing 3-5 steps with a railing? : A Lot 6 Click Score: 17    End of Session Equipment Utilized During Treatment: Other (comment) (RUE sling) Activity Tolerance: Patient tolerated treatment well;Other (comment) (with breakfast tray set up) Patient left: with call bell/phone within reach;with family/visitor present;in chair Nurse Communication: Mobility status PT Visit Diagnosis: Unsteadiness on feet (R26.81);Other abnormalities of gait and mobility (R26.89);Pain;Muscle weakness  (generalized) (M62.81) Pain - Right/Left: Right (knee) Pain - part of body: Shoulder (Bilateral)     Time: 9097-9061 PT Time Calculation (min) (ACUTE ONLY): 36 min  Charges:    $Gait Training: 8-22 mins $Therapeutic Activity: 8-22 mins PT General Charges $$ ACUTE PT VISIT: 1 Visit                     Tanya Bell R. PTA Acute Rehabilitation Services Office: [phone]   Tanya Bell 09/06/2024, 12:58 PM

## 2024-09-07 DIAGNOSIS — S43006A Unspecified dislocation of unspecified shoulder joint, initial encounter: Secondary | ICD-10-CM | POA: Diagnosis not present

## 2024-09-07 LAB — GLUCOSE, CAPILLARY
Glucose-Capillary: 119 mg/dL — ABNORMAL HIGH (ref 70–99)
Glucose-Capillary: 110 mg/dL — ABNORMAL HIGH (ref 70–99)
Glucose-Capillary: 113 mg/dL — ABNORMAL HIGH (ref 70–99)
Glucose-Capillary: 117 mg/dL — ABNORMAL HIGH (ref 70–99)
Glucose-Capillary: 119 mg/dL — ABNORMAL HIGH (ref 70–99)

## 2024-09-07 NOTE — Progress Notes (Signed)
 PROGRESS NOTE    [name]  FMW:[id number] DOB: [birth year] DOA: 09/01/2024 PCP: Leonarda Roxan JAYSON, NP   Brief Narrative:  This 83 yrs old Female with HTN, DM, HLD who presented s/p fall at the grocery store. She was evaluated in the ER and found to have dislocation of both shoulders. These were reduced in the ER, and she was kept in the ER overnight for PT evaluation. During that time, She had reduced appetite, likely due to immobility, pain, and opiates, and developed hypotension, hypoxia, and AKI, so she was given IV fluids and admitted to the hospital. Patient is now making significant improvement.  Assessment & Plan:   Principal Problem:   Shoulder dislocation, unspecified laterality, initial encounter Active Problems:   AKI (acute kidney injury)   Hypoxia   Essential hypertension   Diabetes mellitus type 2, controlled (HCC)   Hyperlipidemia LDL goal <100   History of retinal detachment   Gastroesophageal reflux disease without esophagitis   AKI due to dehydration: Patient had some transient low blood pressure, She was given losartan  in the ER.   She was given IV fluids overnight and creatinine started to improve.   Baseline creatinine is 0.7, peak creatinine here was 1.6. Continue to Hold losartan .  Encourage oral fluids.  AKI resolved.   Hypoxia due to atelectasis: Now Resolved.   CTA chest ruled out  PE, No airspace disease, No edema. Continue Incentive spirometery   Bilateral dislocated shoulders: Right arm paralysis due to brachial plexus injury: Both Arms reduced in the ER.   Orthopedics recommended nonoperative management, outpatient follow-up. Expect the brachioplexus injury to gradually resolve. - Nonweightbearing bilateral upper extremities. - PT/OT > SNF - Bilateral arm slings is only for comfort. - Analgesics as needed   Hypertension: Blood pressure trending up - Resume amlodipine , carvedilol , losartan  one by one. - Continue to monitor blood  pressure.   Diabetes melitis type II: Glucose controlled. - Continue linagliptin  - Okay to defer sliding scale corrections for now.   Hyperlipidemia: -Continue Zetia    Glaucoma: - Continue Acular , Pred forte    DVT prophylaxis: Lovenox  Code Status: Full code Family Communication: Family at bedside. Disposition Plan:    Status is: Inpatient Remains inpatient appropriate because: Patient admitted with right arm paralysis and is nonweightbearing on both arms,  will require substantial and prolonged rehabilitation to return to her prior level of functioning.  PT and OT recommended SNF.  Patient refused SNF. She wants to go home with home health services.    Consultants:  Orthopaedics  Procedures: None Antimicrobials:  Anti-infectives (From admission, onward)    None      Subjective: Patient was seen and examined at bedside. Overnight events noted. Patient was sitting comfortably on the chair, states she does not want to go to rehab.   She wants to go home with home health since husband needs full-time care.  Objective: Vitals:   09/06/24 0812 09/06/24 1958 09/07/24 0448 09/07/24 0746  BP: (!) 170/68 (!) 157/51 (!) 158/53 (!) 157/59  Pulse: 75 69 70 68  Resp: 16 17 15 18   Temp: 98 F (36.7 C) 98.5 F (36.9 C) 98.4 F (36.9 C) 98.8 F (37.1 C)  TempSrc: Oral Oral Oral Oral  SpO2: 93% 95% 95% 94%  Weight:      Height:        Intake/Output Summary (Last 24 hours) at 09/07/2024 1359 Last data filed at 09/07/2024 0900 Gross per 24 hour  Intake 618.85 ml  Output --  Net 618.85 ml   [employer]   09/01/24 1735  Weight: 68.5 kg    Examination:  General exam: Appears calm and comfortable, deconditioned, not in any acute distress. Respiratory system: CTA Bilaterally. Respiratory effort normal. RR 16 Cardiovascular system: S1 & S2 heard, RRR. No JVD, murmurs, rubs, gallops or clicks.  Gastrointestinal system: Abdomen is non distended, soft and non tender.   Normal bowel sounds heard. Central nervous system: Alert and oriented x 3. No focal neurological deficits. Extremities: Right upper extremity in sling. Skin: No rashes, lesions or ulcers Psychiatry:Mood & affect appropriate.   Data Reviewed: I have personally reviewed following labs and imaging studies  CBC: Recent Labs  Lab 09/01/24 1736 09/03/24 1220 09/03/24 1253 09/04/24 0430 09/05/24 0419  WBC 11.1* 10.4  --  10.1 8.0  NEUTROABS 7.2 7.0  --   --   --   HGB 13.6 12.5 12.9 11.7* 13.3  HCT 39.4 36.1 38.0 33.9* 37.9  MCV 75.3* 74.7*  --  74.2* 74.0*  PLT 177 146*  --  137* 147*   Basic Metabolic Panel: Recent Labs  Lab 09/01/24 1736 09/03/24 1220 09/03/24 1253 09/04/24 0430 09/05/24 0419  NA 138 133* 135 135 137  K 4.5 3.8 4.1 3.7 3.7  CL 105 102 102 103 103  CO2 20* 21*  --  25 25  GLUCOSE 130* 138* 143* 122* 115*  BUN 24* 36* 43* 36* 30*  CREATININE 0.72 1.52* 1.60* 1.15* 0.78  CALCIUM  9.3 8.5*  --  8.7* 8.9   GFR: Estimated Creatinine Clearance: 48 mL/min (by C-G formula based on SCr of 0.78 mg/dL). Liver Function Tests: Recent Labs  Lab 09/03/24 1220 09/05/24 0419  AST 16 18  ALT 15 16  ALKPHOS 49 46  BILITOT 1.1 1.0  PROT 7.0 6.9  ALBUMIN 2.9* 2.7*   No results for input(s): LIPASE, AMYLASE in the last 168 hours. No results for input(s): AMMONIA in the last 168 hours. Coagulation Profile: No results for input(s): INR, PROTIME in the last 168 hours. Cardiac Enzymes: Recent Labs  Lab 09/03/24 1408  CKTOTAL 286*   BNP (last 3 results) No results for input(s): PROBNP in the last 8760 hours. HbA1C: No results for input(s): HGBA1C in the last 72 hours. CBG: Recent Labs  Lab 09/06/24 1126 09/06/24 1704 09/06/24 2150 09/07/24 0648 09/07/24 1132  GLUCAP 154* 134* 119* 110* 113*   Lipid Profile: No results for input(s): CHOL, HDL, LDLCALC, TRIG, CHOLHDL, LDLDIRECT in the last 72 hours. Thyroid  Function Tests: No  results for input(s): TSH, T4TOTAL, FREET4, T3FREE, THYROIDAB in the last 72 hours. Anemia Panel: No results for input(s): VITAMINB12, FOLATE, FERRITIN, TIBC, IRON, RETICCTPCT in the last 72 hours. Sepsis Labs: No results for input(s): PROCALCITON, LATICACIDVEN in the last 168 hours.  No results found for this or any previous visit (from the past 240 hours).   Radiology Studies: No results found.  Scheduled Meds:  cyanocobalamin   1,000 mcg Oral Daily   enoxaparin  (LOVENOX ) injection  40 mg Subcutaneous Q24H   ezetimibe   10 mg Oral Daily   ketorolac   1 drop Right Eye QID   linagliptin   5 mg Oral Daily   pantoprazole   40 mg Oral Daily   prednisoLONE  acetate  1 drop Right Eye QID   sodium chloride  flush  3 mL Intravenous Q12H   Continuous Infusions:   LOS: 4 days    Time spent: 35 mins    Darcel Dawley, MD [facility]   If 7PM-7AM,  please contact night-coverage

## 2024-09-07 NOTE — Progress Notes (Signed)
 Physical Therapy Treatment Patient Details Name: Tanya Bell MRN: [id number] DOB: May 31, 1941 Today's Date: 09/07/2024   History of Present Illness Pt is an 83 y/o F presenting to ED on 9/20 after fall resulting in bil shoulder dislocations, both reduced in the ED. Pt with likely residual RUE nerve pasly. PMH includes DM2, HTN    PT Comments  Pt up in chair on arrival, pleasant and agreeable to session with continues progress towards acute goals. Pt continues to demonstrate instability with dynamic balance during standing activity as demonstrated by pt scoring 19/24 on the DGI indicating pt at increased risk for falls. Pt requiring grossly CGA for gait on level even surface, however with balance challenges pt needing up to min A to maintain balance and prevent falling. Patient will benefit from continued inpatient follow up therapy, <3 hours/day to address deficits and maximize functional independence and increase safety with mobility. Pt continues to benefit from skilled PT services to progress toward functional mobility goals.      If plan is discharge home, recommend the following: A little help with walking and/or transfers;A lot of help with bathing/dressing/bathroom;Assistance with cooking/housework;Assist for transportation;Help with stairs or ramp for entrance   Can travel by private vehicle     Yes  Equipment Recommendations  None recommended by PT    Recommendations for Other Services       Precautions / Restrictions Precautions Precautions: Fall;Other (comment) Recall of Precautions/Restrictions: Intact Precaution/Restrictions Comments: Bil shoulder dislocations, reduced in ED Required Braces or Orthoses: Sling;Splint/Cast Splint/Cast: RUE wrist cock up splint for daytime wear, resting hand splint nightly wear Restrictions Weight Bearing Restrictions Per Provider Order: Yes RUE Weight Bearing Per Provider Order: Non weight bearing LUE Weight Bearing Per Provider  Order: Non weight bearing     Mobility  Bed Mobility Overal bed mobility: Needs Assistance             General bed mobility comments: pt up in chair on arrival    Transfers Overall transfer level: Needs assistance Equipment used: None Transfers: Sit to/from Stand Sit to Stand: Contact guard assist           General transfer comment: CGA for safety    Ambulation/Gait Ambulation/Gait assistance: Contact guard assist Gait Distance (Feet): 1000 Feet Assistive device: None Gait Pattern/deviations: Step-through pattern, Drifts right/left Gait velocity: reduced     General Gait Details: improved balance this session, slight lateral sway intially with short shuffling steps,improving with distance, x1 minimal LOB with head turning to R needing minA to correct   Stairs             Wheelchair Mobility     Tilt Bed    Modified Rankin (Stroke Patients Only)       Balance Overall balance assessment: Needs assistance Sitting-balance support: Feet supported, No upper extremity supported Sitting balance-Leahy Scale: Good     Standing balance support: No upper extremity supported Standing balance-Leahy Scale: Fair                   Standardized Balance Assessment Standardized Balance Assessment : Dynamic Gait Index   Dynamic Gait Index Level Surface: Normal Change in Gait Speed: Normal Gait with Horizontal Head Turns: Mild Impairment Gait with Vertical Head Turns: Normal Gait and Pivot Turn: Normal Step Over Obstacle: Moderate Impairment Step Around Obstacles: Normal Steps: Moderate Impairment Total Score: 19      Communication Communication Communication: No apparent difficulties  Cognition Arousal: Alert Behavior During Therapy: WFL for tasks assessed/performed  PT - Cognitive impairments: No apparent impairments                         Following commands: Intact      Cueing Cueing Techniques: Verbal cues  Exercises       General Comments General comments (skin integrity, edema, etc.): Pt so limited by nerve involvement in B shoulders.  Pt seems to be seeing that she cannot take care of self and husband at home. Pt has asked a daughter to come stay with husband so pt can go to rehab but unsure the status of this at this time.      Pertinent Vitals/Pain Pain Assessment Pain Assessment: Faces Pain Score: 0-No pain Pain Intervention(s): Monitored during session, Limited activity within patient's tolerance    Home Living                          Prior Function            PT Goals (current goals can now be found in the care plan section) Acute Rehab PT Goals Patient Stated Goal: Be able to be at home PT Goal Formulation: With patient Time For Goal Achievement: 09/16/24 Progress towards PT goals: Progressing toward goals    Frequency    Min 3X/week      PT Plan      Co-evaluation              AM-PAC PT 6 Clicks Mobility   Outcome Measure  Help needed turning from your back to your side while in a flat bed without using bedrails?: A Little Help needed moving from lying on your back to sitting on the side of a flat bed without using bedrails?: A Little Help needed moving to and from a bed to a chair (including a wheelchair)?: A Little Help needed standing up from a chair using your arms (e.g., wheelchair or bedside chair)?: A Little Help needed to walk in hospital room?: A Little Help needed climbing 3-5 steps with a railing? : A Lot 6 Click Score: 17    End of Session Equipment Utilized During Treatment: Other (comment) (RUE sling) Activity Tolerance: Patient tolerated treatment well Patient left: with call bell/phone within reach;with family/visitor present;in chair Nurse Communication: Mobility status PT Visit Diagnosis: Unsteadiness on feet (R26.81);Other abnormalities of gait and mobility (R26.89);Pain;Muscle weakness (generalized) (M62.81) Pain - Right/Left:  Right (knee) Pain - part of body: Shoulder (Bilateral)     Time: 8988-8971 PT Time Calculation (min) (ACUTE ONLY): 17 min  Charges:    $Gait Training: 8-22 mins PT General Charges $$ ACUTE PT VISIT: 1 Visit                     Tanya Bell R. PTA Acute Rehabilitation Services Office: [phone]   Tanya Bell 09/07/2024, 11:31 AM

## 2024-09-07 NOTE — Plan of Care (Signed)
   Problem: Clinical Measurements: Goal: Respiratory complications will improve Outcome: Progressing Goal: Cardiovascular complication will be avoided Outcome: Progressing   Problem: Activity: Goal: Risk for activity intolerance will decrease Outcome: Progressing

## 2024-09-07 NOTE — Progress Notes (Signed)
 Occupational Therapy Treatment Patient Details Name: Tanya Bell MRN: [id number] DOB: 1941-09-05 Today's Date: 09/07/2024   History of present illness Pt is an 83 y/o F presenting to ED on 9/20 after fall resulting in bil shoulder dislocations, both reduced in the ED. Pt with likely residual RUE nerve pasly. PMH includes DM2, HTN   OT comments  Pt making slow but steady progress with adls and therapeutic exercises.  Pt is very motivated to learn to do all she can on her own but is seeing that it may be impossible to take care of herself and her husband at home. Pt talked about talking to daughter to see if she could stay with husband so she can go to SNF rehab.  Pt doing well with adaptive equipment to assist with LE dressing although continues to need mod assist. UE dressing is most difficult due to nerve damage and inability to functionally do a lot with B shoulders.  Pt would benefit from <3 hours of therapy a day prior to going home to maximize independence with adls.  Husband has dementia and needs 24 hour supervision and care with IADLS.  As much as pt would like to go home, therapist does not feel this is a safe option.      If plan is discharge home, recommend the following:  A little help with walking and/or transfers;A lot of help with bathing/dressing/bathroom;Assistance with cooking/housework;Direct supervision/assist for medications management;Assist for transportation;Direct supervision/assist for financial management;Help with stairs or ramp for entrance   Equipment Recommendations  Tub/shower bench;BSC/3in1    Recommendations for Other Services      Precautions / Restrictions Precautions Precautions: Fall;Other (comment) Recall of Precautions/Restrictions: Intact Precaution/Restrictions Comments: Bil shoulder dislocations, reduced in ED Required Braces or Orthoses: Sling;Splint/Cast Splint/Cast: RUE wrist cock up splint for daytime wear, resting hand splint nightly  wear Restrictions Weight Bearing Restrictions Per Provider Order: Yes RUE Weight Bearing Per Provider Order: Non weight bearing LUE Weight Bearing Per Provider Order: Non weight bearing Other Position/Activity Restrictions: ok to use arms for ADL; slings for comfort however no functional use RUE - sling used to support arm       Mobility Bed Mobility               General bed mobility comments: Pt on EOB on arrival    Transfers Overall transfer level: Needs assistance Equipment used: None Transfers: Sit to/from Stand Sit to Stand: Contact guard assist           General transfer comment: CGA     Balance Overall balance assessment: Needs assistance Sitting-balance support: Feet supported, No upper extremity supported Sitting balance-Leahy Scale: Good     Standing balance support: No upper extremity supported Standing balance-Leahy Scale: Fair Standing balance comment: Pt transferred with CG assist for balance.  This was pt's first time OOB today and feel pt could achieve supervision.                           ADL either performed or assessed with clinical judgement   ADL Overall ADL's : Needs assistance/impaired Eating/Feeding: Set up;Sitting Eating/Feeding Details (indicate cue type and reason): address some ways to set up tray one handed using knees to stablize objects.             Upper Body Dressing : Moderate assistance Upper Body Dressing Details (indicate cue type and reason): donnign/doffing sling and shirt using UB ADL compensatory strategy. Pt with great  difficulty managing dressing. This discussion did lean pt to have some insight about  how difficult it will be to care for herself at home. Lower Body Dressing: Moderate assistance Lower Body Dressing Details (indicate cue type and reason): Pt doffed socks and shoes with dressing stick.  Once socks started over feet, pt could pull them up with equipment and donned shoes with long shoe horn.   Husband unabe to get down to pt's feet to assist her to get sock started.  Will continue to work on achieving figure 4 position.             Functional mobility during ADLs: Minimal assistance General ADL Comments: seen for therex    Extremity/Trunk Assessment Upper Extremity Assessment Upper Extremity Assessment: RUE deficits/detail;LUE deficits/detail RUE Deficits / Details: no functional use R hand - no movement observed; unable to flex elbow, extension 2/5; no sup/pronation; abducts shoulder to 30; unable to complete FF RUE: Unable to fully assess due to pain RUE Sensation: decreased light touch RUE Coordination: decreased fine motor;decreased gross motor LUE Deficits / Details: elbow/wrist/hand ROM WFL; moves shoulder into abduction to @ 30; unable to move into FF LUE Sensation: decreased light touch LUE Coordination: decreased fine motor   Lower Extremity Assessment Lower Extremity Assessment: Defer to PT evaluation        Vision   Vision Assessment?: No apparent visual deficits   Perception Perception Perception: Not tested   Praxis Praxis Praxis: Not tested   Communication Communication Communication: No apparent difficulties   Cognition Arousal: Alert Behavior During Therapy: WFL for tasks assessed/performed Cognition: No apparent impairments     Awareness: Online awareness impaired       OT - Cognition Comments: unable to use BUE  however wanting to go home to take care of her husband                 Following commands: Intact        Cueing   Cueing Techniques: Verbal cues  Exercises Exercises: Other exercises, General Upper Extremity General Exercises - Upper Extremity Shoulder Flexion: PROM, Right, 5 reps, Left Elbow Flexion: PROM, Right, 5 reps Elbow Extension: PROM, Right, 5 reps Wrist Flexion: PROM, Right, 5 reps Wrist Extension: PROM, Right, 5 reps Digit Composite Flexion: Right, 5 reps, PROM Composite Extension: PROM, Right, 10  reps Other Exercises Other Exercises: pt using squeeze foam on her own Other Exercises: LUE digit abd/add x5 Other Exercises: LUE digit opposition x5    Shoulder Instructions       General Comments Pt so limited by nerve involvement in B shoulders.  Pt seems to be seeing that she cannot take care of self and husband at home. Pt has asked a daughter to come stay with husband so pt can go to rehab but unsure the status of this at this time.    Pertinent Vitals/ Pain       Pain Assessment Pain Assessment: Faces Faces Pain Scale: Hurts a little bit Pain Location: RUE Pain Descriptors / Indicators: Discomfort, Grimacing, Guarding Pain Intervention(s): Limited activity within patient's tolerance, Monitored during session, Repositioned  Home Living                                          Prior Functioning/Environment              Frequency  Min 3X/week  Progress Toward Goals  OT Goals(current goals can now be found in the care plan section)  Progress towards OT goals: Progressing toward goals  Acute Rehab OT Goals Patient Stated Goal: I want to be able to care for myself and my husband OT Goal Formulation: With patient Time For Goal Achievement: 09/16/24 Potential to Achieve Goals: Good ADL Goals Pt Will Perform Upper Body Dressing: sitting;with min assist Pt Will Perform Lower Body Dressing: with min assist;sitting/lateral leans;sit to/from stand Pt Will Transfer to Toilet: with min assist;ambulating;regular height toilet Pt Will Perform Tub/Shower Transfer: Tub transfer;Shower transfer;tub bench;with min assist;ambulating Additional ADL Goal #1: pt will perform bed mobility min A in prep for ADLs.  Plan      Co-evaluation                 AM-PAC OT 6 Clicks Daily Activity     Outcome Measure   Help from another person eating meals?: A Little Help from another person taking care of personal grooming?: A Lot Help from another  person toileting, which includes using toliet, bedpan, or urinal?: A Lot Help from another person bathing (including washing, rinsing, drying)?: A Lot Help from another person to put on and taking off regular upper body clothing?: A Lot Help from another person to put on and taking off regular lower body clothing?: A Lot 6 Click Score: 13    End of Session    OT Visit Diagnosis: Unsteadiness on feet (R26.81);Other abnormalities of gait and mobility (R26.89);Muscle weakness (generalized) (M62.81)   Activity Tolerance Patient tolerated treatment well   Patient Left in chair;with call bell/phone within reach;with family/visitor present;with chair alarm set   Nurse Communication Mobility status        Time: 9085-9056 OT Time Calculation (min): 29 min  Charges: OT General Charges $OT Visit: 1 Visit OT Treatments $Self Care/Home Management : 8-22 mins $Therapeutic Exercise: 8-22 mins   Joshua Silvano Dragon 09/07/2024, 9:58 AM

## 2024-09-08 DIAGNOSIS — S43006A Unspecified dislocation of unspecified shoulder joint, initial encounter: Secondary | ICD-10-CM | POA: Diagnosis not present

## 2024-09-08 LAB — GLUCOSE, CAPILLARY
Glucose-Capillary: 110 mg/dL — ABNORMAL HIGH (ref 70–99)
Glucose-Capillary: 146 mg/dL — ABNORMAL HIGH (ref 70–99)
Glucose-Capillary: 113 mg/dL — ABNORMAL HIGH (ref 70–99)
Glucose-Capillary: 120 mg/dL — ABNORMAL HIGH (ref 70–99)

## 2024-09-08 NOTE — Progress Notes (Signed)
 PROGRESS NOTE    Tanya Bell  FMW:[id number] DOB: [birth year] DOA: 09/01/2024 PCP: Leonarda Roxan JAYSON, NP   Brief Narrative:  This 83 yrs old Female with HTN, DM, HLD who presented s/p fall at the grocery store. She was evaluated in the ER and found to have dislocation of both shoulders. These were reduced in the ER, and she was kept in the ER overnight for PT evaluation. During that time, She had reduced appetite, likely due to immobility, pain, and opiates, and developed hypotension, hypoxia, and AKI, so she was given IV fluids and admitted to the hospital. Patient is now making significant improvement.  Assessment & Plan:   Principal Problem:   Shoulder dislocation, unspecified laterality, initial encounter Active Problems:   AKI (acute kidney injury)   Hypoxia   Essential hypertension   Diabetes mellitus type 2, controlled (HCC)   Hyperlipidemia LDL goal <100   History of retinal detachment   Gastroesophageal reflux disease without esophagitis   AKI due to dehydration: Patient had some transient low blood pressure, She was given losartan  in the ER.   She was given IV fluids overnight and creatinine started to improve.   Baseline creatinine is 0.7, peak creatinine here was 1.6. Continue to Hold losartan .  Encourage oral fluids.  AKI resolved.   Hypoxia due to atelectasis: Now Resolved.   CTA chest ruled out  PE, No airspace disease, No edema. Continue Incentive spirometery   Bilateral dislocated shoulders: Right arm paralysis due to brachial plexus injury: Both Arms reduced in the ER.   Orthopedics recommended nonoperative management, outpatient follow-up. Expect the brachioplexus injury to gradually resolve. - Nonweightbearing bilateral upper extremities. - PT/OT > SNF - Bilateral arm slings is only for comfort. - Analgesics as needed   Hypertension: Blood pressure trending up - Resume amlodipine , carvedilol , losartan  one by one. - Continue to monitor blood  pressure.   Diabetes melitis type II: Glucose controlled. - Continue linagliptin  - Okay to defer sliding scale corrections for now.   Hyperlipidemia: -Continue Zetia    Glaucoma: - Continue Acular , Pred forte    DVT prophylaxis: Lovenox  Code Status: Full code Family Communication: Family at bedside. Disposition Plan:    Status is: Inpatient Remains inpatient appropriate because: Patient admitted with right arm paralysis and is nonweightbearing on both arms,  will require substantial and prolonged rehabilitation to return to her prior level of functioning.  PT and OT recommended SNF.  Patient refused SNF. She wants to go home with home health services. Spoke with daughter [name], she will be coming from [place] to stay with her father meanwhile [name] can go to [employer].    Consultants:  Orthopaedics  Procedures: None Antimicrobials:  Anti-infectives (From admission, onward)    None      Subjective: Patient was seen and examined at bedside. Overnight events noted. Patient was sitting comfortably on the chair, states her daughter will come to help him.  Objective: Vitals:   09/07/24 0746 09/07/24 1514 09/07/24 2213 09/08/24 0641  BP: (!) 157/59 (!) 152/60 (!) 164/59 (!) 165/73  Pulse: 68 70 68 68  Resp: 18 16 18 16   Temp: 98.8 F (37.1 C)  98.6 F (37 C) 98.8 F (37.1 C)  TempSrc: Oral  Oral Oral  SpO2: 94% 97% 100% 100%  Weight:      Height:        Intake/Output Summary (Last 24 hours) at 09/08/2024 1138 Last data filed at 09/08/2024 0900 Gross per 24 hour  Intake 240 ml  Output --  Net 240 ml   Filed Weights   09/01/24 1735  Weight: 68.5 kg    Examination:  General exam: Appears calm and comfortable, deconditioned, not in any acute distress. Respiratory system: CTA Bilaterally. Respiratory effort normal. RR 14 Cardiovascular system: S1 & S2 heard, RRR. No JVD, murmurs, rubs, gallops or clicks.  Gastrointestinal system: Abdomen is non distended,  soft and non tender.  Normal bowel sounds heard. Central nervous system: Alert and oriented x 3. No focal neurological deficits. Extremities: Right upper extremity in sling. Skin: No rashes, lesions or ulcers Psychiatry:Mood & affect appropriate.   Data Reviewed: I have personally reviewed following labs and imaging studies  CBC: Recent Labs  Lab 09/01/24 1736 09/03/24 1220 09/03/24 1253 09/04/24 0430 09/05/24 0419  WBC 11.1* 10.4  --  10.1 8.0  NEUTROABS 7.2 7.0  --   --   --   HGB 13.6 12.5 12.9 11.7* 13.3  HCT 39.4 36.1 38.0 33.9* 37.9  MCV 75.3* 74.7*  --  74.2* 74.0*  PLT 177 146*  --  137* 147*   Basic Metabolic Panel: Recent Labs  Lab 09/01/24 1736 09/03/24 1220 09/03/24 1253 09/04/24 0430 09/05/24 0419  NA 138 133* 135 135 137  K 4.5 3.8 4.1 3.7 3.7  CL 105 102 102 103 103  CO2 20* 21*  --  25 25  GLUCOSE 130* 138* 143* 122* 115*  BUN 24* 36* 43* 36* 30*  CREATININE 0.72 1.52* 1.60* 1.15* 0.78  CALCIUM  9.3 8.5*  --  8.7* 8.9   GFR: Estimated Creatinine Clearance: 48 mL/min (by C-G formula based on SCr of 0.78 mg/dL). Liver Function Tests: Recent Labs  Lab 09/03/24 1220 09/05/24 0419  AST 16 18  ALT 15 16  ALKPHOS 49 46  BILITOT 1.1 1.0  PROT 7.0 6.9  ALBUMIN 2.9* 2.7*   No results for input(s): LIPASE, AMYLASE in the last 168 hours. No results for input(s): AMMONIA in the last 168 hours. Coagulation Profile: No results for input(s): INR, PROTIME in the last 168 hours. Cardiac Enzymes: Recent Labs  Lab 09/03/24 1408  CKTOTAL 286*   BNP (last 3 results) No results for input(s): PROBNP in the last 8760 hours. HbA1C: No results for input(s): HGBA1C in the last 72 hours. CBG: Recent Labs  Lab 09/07/24 0648 09/07/24 1132 09/07/24 1634 09/07/24 2116 09/08/24 0514  GLUCAP 110* 113* 117* 119* 110*   Lipid Profile: No results for input(s): CHOL, HDL, LDLCALC, TRIG, CHOLHDL, LDLDIRECT in the last 72 hours. Thyroid   Function Tests: No results for input(s): TSH, T4TOTAL, FREET4, T3FREE, THYROIDAB in the last 72 hours. Anemia Panel: No results for input(s): VITAMINB12, FOLATE, FERRITIN, TIBC, IRON, RETICCTPCT in the last 72 hours. Sepsis Labs: No results for input(s): PROCALCITON, LATICACIDVEN in the last 168 hours.  No results found for this or any previous visit (from the past 240 hours).   Radiology Studies: No results found.  Scheduled Meds:  cyanocobalamin   1,000 mcg Oral Daily   enoxaparin  (LOVENOX ) injection  40 mg Subcutaneous Q24H   ezetimibe   10 mg Oral Daily   ketorolac   1 drop Right Eye QID   linagliptin   5 mg Oral Daily   pantoprazole   40 mg Oral Daily   prednisoLONE  acetate  1 drop Right Eye QID   sodium chloride  flush  3 mL Intravenous Q12H   Continuous Infusions:   LOS: 5 days    Time spent: 35 mins    Darcel Dawley, MD [facility]  If 7PM-7AM, please contact night-coverage

## 2024-09-08 NOTE — Progress Notes (Addendum)
 Occupational Therapy Treatment Patient Details Name: Tanya Bell MRN: [id number] DOB: 01-23-1941 Today's Date: 09/08/2024   History of present illness Pt is an 83 y/o F presenting to ED on 9/20 after fall resulting in bil shoulder dislocations, both reduced in the ED. Pt with likely residual RUE nerve pasly. PMH includes DM2, HTN   OT comments  Pt. Seen for skilled OT treatment session.  Pt. Able to complete bed mobility with assistance with trunk while coming into sitting.  Ambulation to/from b.room with CGA.  Assistance for peri care secondary to limited use of BUEs.  Pt. Completed HEP for BUEs tolerating ROM well RUE.  Heavy assistance for tray set up for opening containers, use of utensils for cutting food prior to eating breakfast.  RUE in wrist cock up splint at end of session.  Cont. With acute OT POC.  Agree with d/c recommendations for <3 hrs/day of continued therapies.       If plan is discharge home, recommend the following:  A little help with walking and/or transfers;A lot of help with bathing/dressing/bathroom;Assistance with cooking/housework;Direct supervision/assist for medications management;Assist for transportation;Direct supervision/assist for financial management;Help with stairs or ramp for entrance   Equipment Recommendations  Tub/shower bench;BSC/3in1    Recommendations for Other Services      Precautions / Restrictions Precautions Precautions: Fall;Other (comment) Precaution/Restrictions Comments: Bil shoulder dislocations, reduced in ED Required Braces or Orthoses: Sling;Splint/Cast Splint/Cast: RUE wrist cock up splint for daytime wear, resting hand splint nightly wear Restrictions RUE Weight Bearing Per Provider Order: Non weight bearing LUE Weight Bearing Per Provider Order: Non weight bearing Other Position/Activity Restrictions: ok to use arms for ADL; slings for comfort however no functional use RUE - sling used to support arm       Mobility  Bed Mobility   MOD A for trunk support, cues to scoot to edge of bed Able to scoot up in bed at end of session with use of BLEs. Good adherence to not using UEs                   Transfers    CGA sit/stand, cues for slowing pace waiting to get bearings prior to starting ambulation.  CGA sit/stand from toilet, and for edge of bed sit/stand                      Balance                                           ADL either performed or assessed with clinical judgement   ADL Overall ADL's : Needs assistance/impaired Eating/Feeding: Maximal assistance;Set up;Sitting;Bed level Eating/Feeding Details (indicate cue type and reason): husband sleeping in recliner so pt. opted for bed level for eating breakfast, max a for opening/ managing containers, along with cutting food and pouring syrup ect. Grooming: Therapist, nutritional;Set up;Bed level           Upper Body Dressing : Sitting;Maximal assistance Upper Body Dressing Details (indicate cue type and reason): sling and splint management, some assistance from pt. holding RUE with LUE to place in trough of sling Lower Body Dressing: Moderate assistance;Sitting/lateral leans Lower Body Dressing Details (indicate cue type and reason): able to don shoes by sliding foot into them but without use of A/E required assistance pulling the back of the shoe out as she was stepping on it  with foot in the shoe Toilet Transfer: Contact guard assist;Regular Toilet   Toileting- Clothing Manipulation and Hygiene: Minimal assistance;Sitting/lateral lean Toileting - Clothing Manipulation Details (indicate cue type and reason): unable to reach the toilet paper to pull off of the roll needing LUE and was trying to turn to face and reach the t.paper and could not     Functional mobility during ADLs: Contact guard assist      Extremity/Trunk Assessment              Vision       Perception     Praxis     Communication  Communication Communication: No apparent difficulties   Cognition Arousal: Alert Behavior During Therapy: WFL for tasks assessed/performed Cognition: No apparent impairments     Awareness: Online awareness impaired                         Following commands: Intact        Cueing   Cueing Techniques: Verbal cues  Exercises General Exercises - Upper Extremity Elbow Flexion: PROM, Right, 5 reps Elbow Extension: PROM, Right, 5 reps Wrist Flexion: PROM, Right, 5 reps Wrist Extension: PROM, Right, 5 reps Digit Composite Flexion: Right, 5 reps, PROM Composite Extension: PROM, Right, 10 reps Other Exercises Other Exercises: LUE digit abd/add x5    Shoulder Instructions       General Comments      Pertinent Vitals/ Pain       Pain Assessment Pain Score: 4  Pain Location: RUE Pain Descriptors / Indicators: Discomfort, Grimacing, Guarding Pain Intervention(s): Limited activity within patient's tolerance, Monitored during session, Repositioned  Home Living                                          Prior Functioning/Environment              Frequency  Min 3X/week        Progress Toward Goals  OT Goals(current goals can now be found in the care plan section)  Progress towards OT goals: Progressing toward goals     Plan      Co-evaluation                 AM-PAC OT 6 Clicks Daily Activity     Outcome Measure   Help from another person eating meals?: A Little Help from another person taking care of personal grooming?: A Lot Help from another person toileting, which includes using toliet, bedpan, or urinal?: A Lot Help from another person bathing (including washing, rinsing, drying)?: A Lot Help from another person to put on and taking off regular upper body clothing?: A Lot Help from another person to put on and taking off regular lower body clothing?: A Lot 6 Click Score: 13    End of Session Equipment Utilized During  Treatment: Gait belt  OT Visit Diagnosis: Unsteadiness on feet (R26.81);Other abnormalities of gait and mobility (R26.89);Muscle weakness (generalized) (M62.81)   Activity Tolerance Patient tolerated treatment well   Patient Left in bed;with call bell/phone within reach;with bed alarm set   Nurse Communication Mobility status;Other (comment) (pt. requesting ice for water pitcher, also reviewed pt. will be calling for b.room use today vs. pure wik use)        Time: 9185-9155 OT Time Calculation (min): 30 min  Charges: OT General Charges $OT Visit:  1 Visit OT Treatments $Self Care/Home Management : 23-37 mins  Randall, COTA/L Acute Rehabilitation [phone]   CHRISTELLA Nest Lorraine-COTA/L  09/08/2024, 10:26 AM

## 2024-09-08 NOTE — Plan of Care (Signed)
   Problem: Education: Goal: Knowledge of General Education information will improve Description Including pain rating scale, medication(s)/side effects and non-pharmacologic comfort measures Outcome: Progressing

## 2024-09-09 DIAGNOSIS — S43006A Unspecified dislocation of unspecified shoulder joint, initial encounter: Secondary | ICD-10-CM | POA: Diagnosis not present

## 2024-09-09 LAB — CBC
HCT: 34 % — ABNORMAL LOW (ref 36.0–46.0)
Hemoglobin: 11.9 g/dL — ABNORMAL LOW (ref 12.0–15.0)
MCH: 25.6 pg — ABNORMAL LOW (ref 26.0–34.0)
MCHC: 35 g/dL (ref 30.0–36.0)
MCV: 73.3 fL — ABNORMAL LOW (ref 80.0–100.0)
Platelets: 217 K/uL (ref 150–400)
RBC: 4.64 MIL/uL (ref 3.87–5.11)
RDW: 15.8 % — ABNORMAL HIGH (ref 11.5–15.5)
WBC: 7 K/uL (ref 4.0–10.5)
nRBC: 0 % (ref 0.0–0.2)

## 2024-09-09 LAB — BASIC METABOLIC PANEL WITH GFR
Anion gap: 10 (ref 5–15)
BUN: 18 mg/dL (ref 8–23)
CO2: 24 mmol/L (ref 22–32)
Calcium: 9.1 mg/dL (ref 8.9–10.3)
Chloride: 105 mmol/L (ref 98–111)
Creatinine, Ser: 0.64 mg/dL (ref 0.44–1.00)
GFR, Estimated: >60 mL/min (ref >60)
Glucose, Bld: 113 mg/dL — ABNORMAL HIGH (ref 70–99)
Potassium: 4 mmol/L (ref 3.5–5.1)
Sodium: 139 mmol/L (ref 135–145)

## 2024-09-09 LAB — GLUCOSE, CAPILLARY
Glucose-Capillary: 111 mg/dL — ABNORMAL HIGH (ref 70–99)
Glucose-Capillary: 110 mg/dL — ABNORMAL HIGH (ref 70–99)
Glucose-Capillary: 83 mg/dL (ref 70–99)

## 2024-09-09 LAB — PHOSPHORUS: Phosphorus: 4 mg/dL (ref 2.5–4.6)

## 2024-09-09 LAB — MAGNESIUM: Magnesium: 2 mg/dL (ref 1.7–2.4)

## 2024-09-09 MED ORDER — HYDRALAZINE HCL 25 MG PO TABS
25.0000 mg | ORAL_TABLET | Freq: Three times a day (TID) | ORAL | Status: DC
  Administered 2024-09-09 – 2024-09-11 (×7): 25 mg via ORAL
  Filled 2024-09-09 (×7): qty 1

## 2024-09-09 NOTE — Progress Notes (Signed)
 PROGRESS NOTE    Tanya Bell  FMW:[id number] DOB: 1941-03-02 DOA: 09/01/2024 PCP: Leonarda Roxan JAYSON, NP   Brief Narrative:  This 83 yrs old Female with HTN, DM, HLD who presented s/p fall at the grocery store. She was evaluated in the ER and found to have dislocation of both shoulders. These were reduced in the ER, and she was kept in the ER overnight for PT evaluation. During that time, She had reduced appetite, likely due to immobility, pain, and opiates, and developed hypotension, hypoxia, and AKI, so she was given IV fluids and admitted to the hospital. Patient is now making significant improvement.  Assessment & Plan:   Principal Problem:   Shoulder dislocation, unspecified laterality, initial encounter Active Problems:   AKI (acute kidney injury)   Hypoxia   Essential hypertension   Diabetes mellitus type 2, controlled (HCC)   Hyperlipidemia LDL goal <100   History of retinal detachment   Gastroesophageal reflux disease without esophagitis   AKI due to dehydration: Patient had some transient low blood pressure, She was given losartan  in the ER.   She was given IV fluids overnight and creatinine started to improve.   Baseline creatinine is 0.7, peak creatinine here was 1.6. Continue to Hold losartan .  Encourage oral fluids.  AKI resolved.   Hypoxia due to atelectasis: Now Resolved.   CTA chest ruled out  PE, No airspace disease, No edema. Continue Incentive spirometery   Bilateral dislocated shoulders: Right arm paralysis due to brachial plexus injury: Both Arms reduced in the ER.   Orthopedics recommended nonoperative management, outpatient follow-up. Expect the brachioplexus injury to gradually resolve. - Nonweightbearing bilateral upper extremities. - PT/OT > SNF - Bilateral arm slings is only for comfort. - Analgesics as needed   Hypertension: Blood pressure trending up - Resume amlodipine , carvedilol , losartan  one by one. - Continue to monitor blood  pressure.   Diabetes melitis type II: Glucose controlled. - Continue linagliptin  - Okay to defer sliding scale corrections for now.   Hyperlipidemia: -Continue Zetia    Glaucoma: - Continue Acular , Pred forte    DVT prophylaxis: Lovenox  Code Status: Full code Family Communication: Family at bedside. Disposition Plan:    Status is: Inpatient Remains inpatient appropriate because: Patient admitted with right arm paralysis and is nonweightbearing on both arms,  will require substantial and prolonged rehabilitation to return to her prior level of functioning.  PT and OT recommended SNF.  Patient refused SNF. She wants to go home with home health services. Spoke with daughter Tanya Bell, she will be coming from [place]  to stay with her father meanwhile [name] can go to [employer] on tuesday  Consultants:  Orthopaedics  Procedures: None Antimicrobials:  Anti-infectives (From admission, onward)    None      Subjective: Patient was seen and examined at bedside. Overnight events noted. Patient was sitting comfortably on the chair, states her daughter will come to help him. She reports pain is well-controlled.  Objective: Vitals:   09/08/24 0641 09/08/24 1937 09/09/24 0452 09/09/24 0805  BP: (!) 165/73 (!) 171/67 (!) 161/54 (!) 169/72  Pulse: 68 83 71 68  Resp: 16 16 16    Temp: 98.8 F (37.1 C) 98.5 F (36.9 C) 98.2 F (36.8 C) 98.5 F (36.9 C)  TempSrc: Oral Oral Oral Oral  SpO2: 100% 96% 96% 94%  Weight:      Height:        Intake/Output Summary (Last 24 hours) at 09/09/2024 1046 Last data filed at 09/09/2024 1012  Gross per 24 hour  Intake 243 ml  Output 350 ml  Net -107 ml   Filed Weights   09/01/24 1735  Weight: 68.5 kg    Examination:  General exam: Appears calm and comfortable, deconditioned, not in any acute distress. Respiratory system: CTA Bilaterally. Respiratory effort normal. RR 15 Cardiovascular system: S1 & S2 heard, RRR. No JVD, murmurs, rubs,  gallops or clicks.  Gastrointestinal system: Abdomen is non distended, soft and non tender.  Normal bowel sounds heard. Central nervous system: Alert and oriented x 3. No focal neurological deficits. Extremities: Right upper extremity in sling. Skin: No rashes, lesions or ulcers Psychiatry:Mood & affect appropriate.   Data Reviewed: I have personally reviewed following labs and imaging studies  CBC: Recent Labs  Lab 09/03/24 1220 09/03/24 1253 09/04/24 0430 09/05/24 0419 09/09/24 0519  WBC 10.4  --  10.1 8.0 7.0  NEUTROABS 7.0  --   --   --   --   HGB 12.5 12.9 11.7* 13.3 11.9*  HCT 36.1 38.0 33.9* 37.9 34.0*  MCV 74.7*  --  74.2* 74.0* 73.3*  PLT 146*  --  137* 147* 217   Basic Metabolic Panel: Recent Labs  Lab 09/03/24 1220 09/03/24 1253 09/04/24 0430 09/05/24 0419 09/09/24 0519  NA 133* 135 135 137 139  K 3.8 4.1 3.7 3.7 4.0  CL 102 102 103 103 105  CO2 21*  --  25 25 24   GLUCOSE 138* 143* 122* 115* 113*  BUN 36* 43* 36* 30* 18  CREATININE 1.52* 1.60* 1.15* 0.78 0.64  CALCIUM  8.5*  --  8.7* 8.9 9.1  MG  --   --   --   --  2.0  PHOS  --   --   --   --  4.0   GFR: Estimated Creatinine Clearance: 48 mL/min (by C-G formula based on SCr of 0.64 mg/dL). Liver Function Tests: Recent Labs  Lab 09/03/24 1220 09/05/24 0419  AST 16 18  ALT 15 16  ALKPHOS 49 46  BILITOT 1.1 1.0  PROT 7.0 6.9  ALBUMIN 2.9* 2.7*   No results for input(s): LIPASE, AMYLASE in the last 168 hours. No results for input(s): AMMONIA in the last 168 hours. Coagulation Profile: No results for input(s): INR, PROTIME in the last 168 hours. Cardiac Enzymes: Recent Labs  Lab 09/03/24 1408  CKTOTAL 286*   BNP (last 3 results) No results for input(s): PROBNP in the last 8760 hours. HbA1C: No results for input(s): HGBA1C in the last 72 hours. CBG: Recent Labs  Lab 09/08/24 0514 09/08/24 1148 09/08/24 1635 09/08/24 2159 09/09/24 0633  GLUCAP 110* 146* 113* 120* 111*    Lipid Profile: No results for input(s): CHOL, HDL, LDLCALC, TRIG, CHOLHDL, LDLDIRECT in the last 72 hours. Thyroid  Function Tests: No results for input(s): TSH, T4TOTAL, FREET4, T3FREE, THYROIDAB in the last 72 hours. Anemia Panel: No results for input(s): VITAMINB12, FOLATE, FERRITIN, TIBC, IRON, RETICCTPCT in the last 72 hours. Sepsis Labs: No results for input(s): PROCALCITON, LATICACIDVEN in the last 168 hours.  No results found for this or any previous visit (from the past 240 hours).   Radiology Studies: No results found.  Scheduled Meds:  cyanocobalamin   1,000 mcg Oral Daily   enoxaparin  (LOVENOX ) injection  40 mg Subcutaneous Q24H   ezetimibe   10 mg Oral Daily   hydrALAZINE  25 mg Oral Q8H   ketorolac   1 drop Right Eye QID   linagliptin   5 mg Oral Daily   pantoprazole   40 mg Oral Daily   prednisoLONE  acetate  1 drop Right Eye QID   sodium chloride  flush  3 mL Intravenous Q12H   Continuous Infusions:   LOS: 6 days    Time spent: 35 mins    Darcel Dawley, MD [facility]   If 7PM-7AM, please contact night-coverage

## 2024-09-09 NOTE — Progress Notes (Signed)
 Mobility Specialist: Progress Note   09/09/24 1600  Mobility  Activity Ambulated with assistance  Level of Assistance Standby assist, set-up cues, supervision of patient - no hands on  Assistive Device None  Distance Ambulated (ft) 1100 ft  Activity Response Tolerated well  Mobility Referral Yes  Mobility visit 1 Mobility  Mobility Specialist Start Time (ACUTE ONLY) 1210  Mobility Specialist Stop Time (ACUTE ONLY) 1221  Mobility Specialist Time Calculation (min) (ACUTE ONLY) 11 min    Pt received in chair, agreeable to mobility session. SV throughout. No complaints. Slight wheezing during ambulation but pt stated that's baseline, SpO2 98% on RA. Returned to room. Left in chair with all needs met, call bell in reach. Husband present.   Ileana Lute Mobility Specialist Please contact via SecureChat or Rehab office at [phone]

## 2024-09-10 DIAGNOSIS — S43006A Unspecified dislocation of unspecified shoulder joint, initial encounter: Secondary | ICD-10-CM | POA: Diagnosis not present

## 2024-09-10 LAB — GLUCOSE, CAPILLARY
Glucose-Capillary: 102 mg/dL — ABNORMAL HIGH (ref 70–99)
Glucose-Capillary: 138 mg/dL — ABNORMAL HIGH (ref 70–99)
Glucose-Capillary: 113 mg/dL — ABNORMAL HIGH (ref 70–99)
Glucose-Capillary: 120 mg/dL — ABNORMAL HIGH (ref 70–99)

## 2024-09-10 NOTE — Progress Notes (Signed)
 PROGRESS NOTE    Tanya Bell  FMW:[id number] DOB: 08/28/1941 DOA: 09/01/2024 PCP: Tanya Roxan JAYSON, NP   Brief Narrative:  This 83 yrs old Female with HTN, DM, HLD who presented s/p fall at the grocery store. She was evaluated in the ER and found to have dislocation of both shoulders. These were reduced in the ER, and she was kept in the ER overnight for PT evaluation. During that time, She had reduced appetite, likely due to immobility, pain, and opiates, and developed hypotension, hypoxia, and AKI, so she was given IV fluids and admitted to the hospital. Patient is now making significant improvement.  Assessment & Plan:   Principal Problem:   Shoulder dislocation, unspecified laterality, initial encounter Active Problems:   AKI (acute kidney injury)   Hypoxia   Essential hypertension   Diabetes mellitus type 2, controlled (HCC)   Hyperlipidemia LDL goal <100   History of retinal detachment   Gastroesophageal reflux disease without esophagitis   AKI due to dehydration: Patient had some transient low blood pressure, She was given losartan  in the ER.   She was given IV fluids overnight and creatinine started to improve.   Baseline creatinine is 0.7, peak creatinine here was 1.6. Continue to Hold losartan .  Encourage oral fluids.  AKI resolved.   Hypoxia due to atelectasis: Now Resolved.   CTA chest ruled out  PE, No airspace disease, No edema. Continue Incentive spirometery   Bilateral dislocated shoulders: Right arm paralysis due to brachial plexus injury: Both Arms reduced in the ER.   Orthopedics recommended nonoperative management, outpatient follow-up. Expect the brachioplexus injury to gradually resolve. - Nonweightbearing bilateral upper extremities. - PT/OT > SNF - Bilateral arm slings is only for comfort. - Analgesics as needed   Hypertension: Blood pressure trending up. - Resume amlodipine , carvedilol , losartan  one by one. - Continue to monitor blood  pressure.   Diabetes melitis type II: Glucose controlled. - Continue linagliptin  - Okay to defer sliding scale corrections for now.   Hyperlipidemia: -Continue Zetia    Glaucoma: - Continue Acular , Pred forte    DVT prophylaxis: Lovenox  Code Status: Full code Family Communication: Family at bedside. Disposition Plan:    Status is: Inpatient Remains inpatient appropriate because: Patient admitted with right arm paralysis and is nonweightbearing on both arms,  will require substantial and prolonged rehabilitation to return to her prior level of functioning.  PT and OT recommended SNF.  Patient refused SNF. She wants to go home with home health services. Spoke with daughter Tanya Bell, she will be coming from [place]  to stay with her father meanwhile Tanya Bell can go to [employer] on tuesday  Consultants:  Orthopaedics  Procedures: None Antimicrobials:  Anti-infectives (From admission, onward)    None      Subjective: Patient was seen and examined at bedside. Overnight events noted. Patient was sitting comfortably on the chair, states her daughter coming to help her. She reports pain is well-controlled. She has participated with physical therapy.  Objective: Vitals:   09/09/24 0805 09/09/24 1357 09/09/24 2140 09/10/24 0505  BP: (!) 169/72 (!) 141/61 (!) 147/64 (!) 172/66  Pulse: 68 67 81 72  Resp:   16 16  Temp: 98.5 F (36.9 C) 98.3 F (36.8 C) 98.9 F (37.2 C) 98 F (36.7 C)  TempSrc: Oral Oral Oral Oral  SpO2: 94% 94% 95% 97%  Weight:      Height:        Intake/Output Summary (Last 24 hours) at 09/10/2024 1142 Last  data filed at 09/10/2024 0900 Gross per 24 hour  Intake 360 ml  Output --  Net 360 ml   Filed Weights   09/01/24 1735  Weight: 68.5 kg    Examination:  General exam: Appears calm and comfortable, deconditioned, not in any acute distress. Respiratory system: CTA Bilaterally. Respiratory effort normal. RR 14 Cardiovascular system: S1 & S2  heard, RRR. No JVD, murmurs, rubs, gallops or clicks.  Gastrointestinal system: Abdomen is non distended, soft and non tender.  Normal bowel sounds heard. Central nervous system: Alert and oriented x 3. No focal neurological deficits. Extremities: Right upper extremity in sling. Skin: No rashes, lesions or ulcers Psychiatry:Mood & affect appropriate.   Data Reviewed: I have personally reviewed following labs and imaging studies  CBC: Recent Labs  Lab 09/03/24 1220 09/03/24 1253 09/04/24 0430 09/05/24 0419 09/09/24 0519  WBC 10.4  --  10.1 8.0 7.0  NEUTROABS 7.0  --   --   --   --   HGB 12.5 12.9 11.7* 13.3 11.9*  HCT 36.1 38.0 33.9* 37.9 34.0*  MCV 74.7*  --  74.2* 74.0* 73.3*  PLT 146*  --  137* 147* 217   Basic Metabolic Panel: Recent Labs  Lab 09/03/24 1220 09/03/24 1253 09/04/24 0430 09/05/24 0419 09/09/24 0519  NA 133* 135 135 137 139  K 3.8 4.1 3.7 3.7 4.0  CL 102 102 103 103 105  CO2 21*  --  25 25 24   GLUCOSE 138* 143* 122* 115* 113*  BUN 36* 43* 36* 30* 18  CREATININE 1.52* 1.60* 1.15* 0.78 0.64  CALCIUM  8.5*  --  8.7* 8.9 9.1  MG  --   --   --   --  2.0  PHOS  --   --   --   --  4.0   GFR: Estimated Creatinine Clearance: 48 mL/min (by C-G formula based on SCr of 0.64 mg/dL). Liver Function Tests: Recent Labs  Lab 09/03/24 1220 09/05/24 0419  AST 16 18  ALT 15 16  ALKPHOS 49 46  BILITOT 1.1 1.0  PROT 7.0 6.9  ALBUMIN 2.9* 2.7*   No results for input(s): LIPASE, AMYLASE in the last 168 hours. No results for input(s): AMMONIA in the last 168 hours. Coagulation Profile: No results for input(s): INR, PROTIME in the last 168 hours. Cardiac Enzymes: Recent Labs  Lab 09/03/24 1408  CKTOTAL 286*   BNP (last 3 results) No results for input(s): PROBNP in the last 8760 hours. HbA1C: No results for input(s): HGBA1C in the last 72 hours. CBG: Recent Labs  Lab 09/09/24 0633 09/09/24 1628 09/09/24 2134 09/10/24 0631 09/10/24 1134   GLUCAP 111* 110* 83 102* 138*   Lipid Profile: No results for input(s): CHOL, HDL, LDLCALC, TRIG, CHOLHDL, LDLDIRECT in the last 72 hours. Thyroid  Function Tests: No results for input(s): TSH, T4TOTAL, FREET4, T3FREE, THYROIDAB in the last 72 hours. Anemia Panel: No results for input(s): VITAMINB12, FOLATE, FERRITIN, TIBC, IRON, RETICCTPCT in the last 72 hours. Sepsis Labs: No results for input(s): PROCALCITON, LATICACIDVEN in the last 168 hours.  No results found for this or any previous visit (from the past 240 hours).   Radiology Studies: No results found.  Scheduled Meds:  cyanocobalamin   1,000 mcg Oral Daily   enoxaparin  (LOVENOX ) injection  40 mg Subcutaneous Q24H   ezetimibe   10 mg Oral Daily   hydrALAZINE  25 mg Oral Q8H   ketorolac   1 drop Right Eye QID   linagliptin   5 mg Oral  Daily   pantoprazole   40 mg Oral Daily   prednisoLONE  acetate  1 drop Right Eye QID   sodium chloride  flush  3 mL Intravenous Q12H   Continuous Infusions:   LOS: 7 days    Time spent: 35 mins    Darcel Dawley, MD [facility]   If 7PM-7AM, please contact night-coverage

## 2024-09-10 NOTE — [place] FL2 (Signed)
 [place]  MEDICAID FL2 LEVEL OF CARE FORM     IDENTIFICATION  Patient Name: Tanya Bell Birthdate: May 23, 1941 Sex: female Admission Date (Current Location): 09/01/2024  [facility] and [place] Number:  Producer, television/film/video and Address:  [facility], [address], [place], [place] [address]      Provider Number: [id number]  Attending Physician Name and Address:  Leotis Bogus, MD  Relative Name and Phone Number:  Benjaman Rima Daughter   [phone]    Current Level of Care: Hospital Recommended Level of Care: Skilled Nursing Facility Prior Approval Number:    Date Approved/Denied:   PASRR Number: [id number]  Discharge Plan: SNF    Current Diagnoses: Patient Active Problem List   Diagnosis Date Noted   Shoulder dislocation, unspecified laterality, initial encounter 09/03/2024   AKI (acute kidney injury) 09/03/2024   Hypoxia 09/03/2024   Gastroesophageal reflux disease without esophagitis 01/16/2024   Type 2 diabetes mellitus with diabetic polyneuropathy, without long-term current use of insulin (HCC) 01/16/2024   Slow transit constipation 01/16/2024   Cystoid macular edema of right eye 08/12/2022   Epiretinal membrane (ERM) of left eye 08/12/2022   History of retinal detachment 08/12/2022   Type 2 diabetes mellitus without retinopathy (HCC) 08/12/2022   Diabetes mellitus type 2, controlled (HCC) 02/04/2015   Varicose veins of leg with edema 02/04/2015   Essential hypertension 05/07/2014   HTN (hypertension) 03/06/2013   Vaginal dryness, menopausal 03/06/2013   Eczema 03/06/2013   Annual physical exam 03/06/2013   Herpes simplex with other ophthalmic complications 01/04/2013   Disorder of bone and cartilage 01/04/2013   Reflux esophagitis 01/04/2013   Other cataract 01/04/2013   Sickle cell anemia (HCC) 01/04/2013   Thrombocytopenia 01/04/2013   Other dystrophy of vulva 01/04/2013   Diverticulitis of colon (without mention of  hemorrhage)(562.11) 01/04/2013   Disorder of skin or subcutaneous tissue 01/04/2013   Type II or unspecified type diabetes mellitus without mention of complication, not stated as uncontrolled 01/04/2013   Hyperlipidemia LDL goal <100 01/04/2013   Seasonal allergic rhinitis due to pollen 01/04/2013   Extrinsic asthma, unspecified asthma severity, uncomplicated 01/04/2013   Other malaise and fatigue 01/04/2013   Other abnormal glucose 01/04/2013    Orientation RESPIRATION BLADDER Height & Weight     Self, Time, Situation, Place  Normal Continent Weight: 151 lb (68.5 kg) Height:  5' 1 (154.9 cm)  BEHAVIORAL SYMPTOMS/MOOD NEUROLOGICAL BOWEL NUTRITION STATUS        Diet (see discharge summary)  AMBULATORY STATUS COMMUNICATION OF NEEDS Skin   Limited Assist Verbally Skin abrasions                       Personal Care Assistance Level of Assistance  Bathing, Feeding, Dressing Bathing Assistance: Limited assistance Feeding assistance: Maximum assistance Dressing Assistance: Limited assistance     Functional Limitations Info  Sight, Hearing, Speech Sight Info: Adequate Hearing Info: Adequate Speech Info: Adequate    SPECIAL CARE FACTORS FREQUENCY  PT (By licensed PT), OT (By licensed OT)     PT Frequency: 5x week OT Frequency: 5x week            Contractures Contractures Info: Not present    Additional Factors Info  Code Status, Allergies Code Status Info: full Allergies Info: Crestor  (Rosuvastatin  Calcium ), Latex, Sulfa Antibiotics           Current Medications (09/10/2024):  This is the current hospital active medication list Current Facility-Administered Medications  Medication Dose Route Frequency Provider Last Rate Last Admin   acetaminophen  (TYLENOL ) tablet 650 mg  650 mg Oral Q6H PRN Smith, Rondell A, MD   650 mg at 09/08/24 0930   Or   acetaminophen  (TYLENOL ) suppository 650 mg  650 mg Rectal Q6H PRN Smith, Rondell A, MD       albuterol  (PROVENTIL )  (2.5 MG/3ML) 0.083% nebulizer solution 2.5 mg  2.5 mg Nebulization Q6H PRN Smith, Rondell A, MD       cyanocobalamin  (VITAMIN B12) tablet 1,000 mcg  1,000 mcg Oral Daily Penna, Michael A, DO   1,000 mcg at 09/10/24 9095   enoxaparin  (LOVENOX ) injection 40 mg  40 mg Subcutaneous Q24H Reome, Earle J, RPH   40 mg at 09/09/24 1723   ezetimibe  (ZETIA ) tablet 10 mg  10 mg Oral Daily Penna, Michael A, DO   10 mg at 09/10/24 0904   hydrALAZINE (APRESOLINE) tablet 25 mg  25 mg Oral Q8H Khatri, Pardeep, MD   25 mg at 09/10/24 0507   ketorolac  (ACULAR ) 0.5 % ophthalmic solution 1 drop  1 drop Right Eye QID Claudene Reeves A, MD   1 drop at 09/10/24 9096   linagliptin  (TRADJENTA ) tablet 5 mg  5 mg Oral Daily Smith, Rondell A, MD   5 mg at 09/10/24 9095   ondansetron  (ZOFRAN ) tablet 4 mg  4 mg Oral Q6H PRN Smith, Rondell A, MD       Or   ondansetron  (ZOFRAN ) injection 4 mg  4 mg Intravenous Q6H PRN Smith, Rondell A, MD       oxyCODONE  (Oxy IR/ROXICODONE ) immediate release tablet 5 mg  5 mg Oral Q6H PRN Penna, Michael A, DO   5 mg at 09/09/24 2141   pantoprazole  (PROTONIX ) EC tablet 40 mg  40 mg Oral Daily Penna, Michael A, DO   40 mg at 09/10/24 9095   prednisoLONE  acetate (PRED FORTE ) 1 % ophthalmic suspension 1 drop  1 drop Right Eye QID Claudene Reeves A, MD   1 drop at 09/10/24 0905   sodium chloride  flush (NS) 0.9 % injection 3 mL  3 mL Intravenous Q12H Claudene Reeves A, MD   3 mL at 09/10/24 9094     Discharge Medications: Please see discharge summary for a list of discharge medications.  Relevant Imaging Results:  Relevant Lab Results:   Additional Information SSN: [id number]  Bridget Cordella Simmonds, LCSW

## 2024-09-10 NOTE — TOC Progression Note (Addendum)
 Transition of Care [facility]) - Progression Note    Patient Details  Name: Tanya Bell MRN: [id number] Date of Birth: 1941/08/06  Transition of [facility]) CM/SW Contact  Bridget Cordella Simmonds, LCSW Phone Number: 09/10/2024, 10:34 AM  Clinical Narrative:   Per MD and PT, family now coming to [facility] to assist with husband, pt agreeable to SNF, asking for [facility].  Referral sent out in hub for SNF, CSW reached out to [facility] to review.  1045: [name] does offer bed, an take pt tomorrow.  CSW spoke with pt, she does want to accept this offer.  Pt confirms her family is leaving today from [place] , will be here tomorrow.   Discussed transportation to SNF: pt said either her family or her sister in [place] can transport to SNF tomorrow.  Medicare payer with inpt order on 09/03/24.    Expected Discharge Plan:  (vs CIR vs SNF) Barriers to Discharge: No Barriers Identified               Expected Discharge Plan and Services       Living arrangements for the past 2 months: Single Family Home                         Representative spoke with at DME Agency: Alberto Paterson ( Daughter ) [phone] , Grayce Paterson  (Daughter)  Emergency Contact  [phone] HH Arranged: RN, PT, OT, Social Work [facility] Agency: [facility] Date [facility] Agency Contacted: 09/04/24 Time HH Agency Contacted: 1524 Representative spoke with at [facility] Agency: [facility]   Social Drivers of Health (SDOH) Interventions SDOH Screenings   Food Insecurity: No Food Insecurity (09/04/2024)  Housing: Low Risk  (09/04/2024)  Transportation Needs: No Transportation Needs (09/04/2024)  Utilities: Not At Risk (09/04/2024)  Alcohol Screen: Low Risk  (07/26/2018)  Depression (PHQ2-9): Low Risk  (07/18/2024)  Financial Resource Strain: Low Risk  (12/14/2017)  Physical Activity: Inactive (12/14/2017)  Social Connections: Moderately Integrated (09/04/2024)  Stress: Stress Concern Present (12/14/2017)  Tobacco Use:  Medium Risk (09/03/2024)    Readmission Risk Interventions     No data to display

## 2024-09-10 NOTE — Progress Notes (Signed)
 Physical Therapy Treatment Patient Details Name: Tanya Bell MRN: [id number] DOB: 1941/10/06 Today's Date: 09/10/2024   History of Present Illness Pt is an 83 y/o F presenting to ED on 9/20 after fall resulting in bil shoulder dislocations, both reduced in the ED. Pt with likely residual RUE nerve pasly. PMH includes DM2, HTN    PT Comments  Pt seated up EOB on arrival, pleasant and agreeable to session. Pt with night resting splint donned on arrival, requiring max A to doff splint and donn cockup brace. Pt demonstrating transfers and gait with grossly CGA for safety with light cues increased time during transition from sitting>standing to gain standing balance initially. Pt continues to require min A to maintain balance to ascend/descend steps without rail use due to weight bearing precautions. Patient will benefit from continued inpatient follow up therapy, <3 hours/day to address deficits and maximize functional independence and safety as pt remains at risk for falls.     If plan is discharge home, recommend the following: A little help with walking and/or transfers;A lot of help with bathing/dressing/bathroom;Assistance with cooking/housework;Assist for transportation;Help with stairs or ramp for entrance   Can travel by private vehicle     Yes  Equipment Recommendations  None recommended by PT    Recommendations for Other Services       Precautions / Restrictions Precautions Precautions: Fall;Other (comment) Recall of Precautions/Restrictions: Intact Precaution/Restrictions Comments: Bil shoulder dislocations, reduced in ED Required Braces or Orthoses: Sling;Splint/Cast Splint/Cast: RUE wrist cock up splint for daytime wear, resting hand splint nightly wear Restrictions Weight Bearing Restrictions Per Provider Order: Yes RUE Weight Bearing Per Provider Order: Non weight bearing LUE Weight Bearing Per Provider Order: Non weight bearing     Mobility  Bed Mobility Overal  bed mobility: Needs Assistance             General bed mobility comments: pt seated up EOB on arrival    Transfers Overall transfer level: Needs assistance Equipment used: None Transfers: Sit to/from Stand Sit to Stand: Contact guard assist           General transfer comment: CGA for safety    Ambulation/Gait Ambulation/Gait assistance: Contact guard assist Gait Distance (Feet): 510 Feet Assistive device: None Gait Pattern/deviations: Step-through pattern, Drifts right/left Gait velocity: reduced     General Gait Details: improved balance this session, slight lateral sway intially with short shuffling steps,improving with distance, x1 minimal LOB with head turning to R needing minA to correct   Stairs Stairs: Yes Stairs assistance: Min assist Stair Management: No rails, Step to pattern Number of Stairs: 2 (x2) General stair comments: min A to maintain balance with descent, cues for step to pattern and for no rail use due to NWB   Wheelchair Mobility     Tilt Bed    Modified Rankin (Stroke Patients Only)       Balance Overall balance assessment: Needs assistance Sitting-balance support: Feet supported, No upper extremity supported Sitting balance-Leahy Scale: Good     Standing balance support: No upper extremity supported Standing balance-Leahy Scale: Fair Standing balance comment: Pt transferred with CG assist for balance.  This was pt's first time OOB today and feel pt could achieve supervision.                            Communication Communication Communication: No apparent difficulties  Cognition Arousal: Alert Behavior During Therapy: WFL for tasks assessed/performed   PT - Cognitive  impairments: No apparent impairments                         Following commands: Intact      Cueing Cueing Techniques: Verbal cues  Exercises General Exercises - Upper Extremity Elbow Flexion: PROM, Right, 5 reps Elbow Extension:  PROM, Right, 5 reps Wrist Flexion: PROM, Right, 5 reps Wrist Extension: PROM, Right, 5 reps Digit Composite Flexion: Right, 5 reps, PROM Composite Extension: PROM, Right, 10 reps    General Comments        Pertinent Vitals/Pain Pain Assessment Pain Assessment: Faces Faces Pain Scale: Hurts a little bit Pain Location: RUE Pain Descriptors / Indicators: Discomfort, Grimacing, Guarding Pain Intervention(s): Monitored during session, Limited activity within patient's tolerance    Home Living                          Prior Function            PT Goals (current goals can now be found in the care plan section) Acute Rehab PT Goals Patient Stated Goal: to go to rehab PT Goal Formulation: With patient Time For Goal Achievement: 09/16/24 Progress towards PT goals: Progressing toward goals    Frequency    Min 3X/week      PT Plan      Co-evaluation              AM-PAC PT 6 Clicks Mobility   Outcome Measure  Help needed turning from your back to your side while in a flat bed without using bedrails?: A Little Help needed moving from lying on your back to sitting on the side of a flat bed without using bedrails?: A Little Help needed moving to and from a bed to a chair (including a wheelchair)?: A Little Help needed standing up from a chair using your arms (e.g., wheelchair or bedside chair)?: A Little Help needed to walk in hospital room?: A Little Help needed climbing 3-5 steps with a railing? : A Lot 6 Click Score: 17    End of Session Equipment Utilized During Treatment: Other (comment) (RUE sling) Activity Tolerance: Patient tolerated treatment well Patient left: with call bell/phone within reach;with family/visitor present;in chair Nurse Communication: Mobility status PT Visit Diagnosis: Unsteadiness on feet (R26.81);Other abnormalities of gait and mobility (R26.89);Pain;Muscle weakness (generalized) (M62.81) Pain - Right/Left: Right (knee) Pain  - part of body: Shoulder (Bilateral)     Time: 9078-9054 PT Time Calculation (min) (ACUTE ONLY): 24 min  Charges:    $Gait Training: 23-37 mins PT General Charges $$ ACUTE PT VISIT: 1 Visit                     Keniyah Gelinas R. PTA Acute Rehabilitation Services Office: [phone]   Therisa CHRISTELLA Boor 09/10/2024, 12:47 PM

## 2024-09-10 NOTE — Progress Notes (Signed)
 Occupational Therapy Treatment Patient Details Name: Tanya Bell MRN: [id number] DOB: [birth year] Today's Date: 09/10/2024   History of present illness Pt is an 83 y/o F presenting to ED on 9/20 after fall resulting in bil shoulder dislocations, both reduced in the ED. Pt with likely residual RUE nerve pasly. PMH includes DM2, HTN   OT comments  Pt making progress with functional goals. Pt in bed upon arrival with husband present. Pt required min A to elevate trunk to sit EOB, CGA STS and to walk to bathroom, CGA to transfer to commode, min A with clothing mgt and Sup with anterior hygiene. Pt stood at sink to wash/dry L hand CGA. Pt sat back onto EOB to participate in UB ADLs mod A , LB ADLs simulated with A/E. Pt required mod A with LEs back onto bed. Pt very pleasant and cooperative. OT will follow acutely to maximize level of function and safety      If plan is discharge home, recommend the following:  A little help with walking and/or transfers;A lot of help with bathing/dressing/bathroom;Assistance with cooking/housework;Direct supervision/assist for medications management;Assist for transportation;Direct supervision/assist for financial management;Help with stairs or ramp for entrance   Equipment Recommendations  Tub/shower bench;BSC/3in1    Recommendations for Other Services      Precautions / Restrictions Precautions Precautions: Fall;Other (comment) Recall of Precautions/Restrictions: Intact Precaution/Restrictions Comments: Bil shoulder dislocations, reduced in ED Required Braces or Orthoses: Sling;Splint/Cast Splint/Cast: RUE wrist cock up splint for daytime wear, resting hand splint nightly wear Restrictions Weight Bearing Restrictions Per Provider Order: Yes RUE Weight Bearing Per Provider Order: Non weight bearing LUE Weight Bearing Per Provider Order: Non weight bearing Other Position/Activity Restrictions: ok to use arms for ADL; slings for comfort however no  functional use RUE - sling used to support arm       Mobility Bed Mobility Overal bed mobility: Needs Assistance Bed Mobility: Supine to Sit, Sit to Supine     Supine to sit: HOB elevated, Min assist Sit to supine: Mod assist   General bed mobility comments: min A for trunk elevation, mod A with LEs back onto bed    Transfers Overall transfer level: Needs assistance Equipment used: None Transfers: Sit to/from Stand Sit to Stand: Contact guard assist           General transfer comment: CGA for safety     Balance Overall balance assessment: Needs assistance Sitting-balance support: Feet supported, No upper extremity supported Sitting balance-Leahy Scale: Good     Standing balance support: No upper extremity supported Standing balance-Leahy Scale: Fair Standing balance comment: Pt walked to bathroom, transferred to commode, stood at sink for hand hygiene CGA                           ADL either performed or assessed with clinical judgement   ADL Overall ADL's : Needs assistance/impaired Eating/Feeding: Maximal assistance;Set up;Sitting   Grooming: Wash/dry hands;Wash/dry face;Contact guard assist;Standing   Upper Body Bathing: Moderate assistance   Lower Body Bathing: Moderate assistance;Sit to/from stand Lower Body Bathing Details (indicate cue type and reason): long handled sponge Upper Body Dressing : Sitting;Maximal assistance Upper Body Dressing Details (indicate cue type and reason): sling and splint management, some assistance from pt. holding RUE with LUE to place in trough of sling Lower Body Dressing: Moderate assistance;Sitting/lateral leans   Toilet Transfer: Contact guard Geophysical data processor- Clothing Manipulation and Hygiene: Minimal assistance;Sitting/lateral lean;Sit to/from stand  Functional mobility during ADLs: Contact guard assist      Extremity/Trunk Assessment Upper Extremity Assessment Upper Extremity  Assessment: Generalized weakness;RUE deficits/detail;LUE deficits/detail RUE Deficits / Details: no functional use R hand - no movement observed; unable to flex elbow, extension 2/5; no sup/pronation; abducts shoulder to 30; unable to complete FF RUE Coordination: decreased fine motor;decreased gross motor LUE Deficits / Details: elbow/wrist/hand ROM WFL; moves shoulder into abduction to @ 30; unable to move into FF LUE Coordination: decreased fine motor   Lower Extremity Assessment Lower Extremity Assessment: Defer to PT evaluation   Cervical / Trunk Assessment Cervical / Trunk Assessment: Normal    Vision Baseline Vision/History: 1 Wears glasses Ability to See in Adequate Light: 0 Adequate Patient Visual Report: No change from baseline     Perception     Praxis     Communication Communication Communication: No apparent difficulties   Cognition Arousal: Alert Behavior During Therapy: WFL for tasks assessed/performed Cognition: No apparent impairments                               Following commands: Intact        Cueing   Cueing Techniques: Verbal cues  Exercises Other Exercises Other Exercises: pt using squeeze foam, instructed on use of L hand to extend right digits, hold for 3-5 seconds Other Exercises: B digits flexion/extension, composition Other Exercises: B digits flexion/extension, composition    Shoulder Instructions       General Comments      Pertinent Vitals/ Pain       Pain Assessment Pain Assessment: Faces Faces Pain Scale: Hurts a little bit Pain Location: RUE Pain Descriptors / Indicators: Discomfort, Grimacing, Guarding Pain Intervention(s): Limited activity within patient's tolerance, Premedicated before session, Monitored during session, Repositioned  Home Living Family/patient expects to be discharged to:: Private residence                                        Prior Functioning/Environment               Frequency  Min 2X/week        Progress Toward Goals  OT Goals(current goals can now be found in the care plan section)  Progress towards OT goals: Progressing toward goals     Plan      Co-evaluation                 AM-PAC OT 6 Clicks Daily Activity     Outcome Measure   Help from another person eating meals?: A Little Help from another person taking care of personal grooming?: A Lot Help from another person toileting, which includes using toliet, bedpan, or urinal?: A Lot Help from another person bathing (including washing, rinsing, drying)?: A Lot Help from another person to put on and taking off regular upper body clothing?: A Lot Help from another person to put on and taking off regular lower body clothing?: A Lot 6 Click Score: 13    End of Session Equipment Utilized During Treatment: Gait belt  OT Visit Diagnosis: Unsteadiness on feet (R26.81);Other abnormalities of gait and mobility (R26.89);Muscle weakness (generalized) (M62.81)   Activity Tolerance Patient tolerated treatment well   Patient Left in bed;with call bell/phone within reach;with bed alarm set;with family/visitor present   Nurse Communication Mobility status  Time: 8642-8576 OT Time Calculation (min): 26 min  Charges: OT General Charges $OT Visit: 1 Visit OT Treatments $Self Care/Home Management : 8-22 mins $Therapeutic Activity: 8-22 mins    Jacques Karna Loose 09/10/2024, 3:09 PM

## 2024-09-11 DIAGNOSIS — S43006D Unspecified dislocation of unspecified shoulder joint, subsequent encounter: Secondary | ICD-10-CM | POA: Diagnosis not present

## 2024-09-11 DIAGNOSIS — M4982 Spondylopathy in diseases classified elsewhere, cervical region: Secondary | ICD-10-CM | POA: Diagnosis not present

## 2024-09-11 DIAGNOSIS — M24412 Recurrent dislocation, left shoulder: Secondary | ICD-10-CM | POA: Diagnosis not present

## 2024-09-11 DIAGNOSIS — M6281 Muscle weakness (generalized): Secondary | ICD-10-CM | POA: Diagnosis not present

## 2024-09-11 DIAGNOSIS — R2689 Other abnormalities of gait and mobility: Secondary | ICD-10-CM | POA: Diagnosis not present

## 2024-09-11 DIAGNOSIS — E1142 Type 2 diabetes mellitus with diabetic polyneuropathy: Secondary | ICD-10-CM | POA: Diagnosis not present

## 2024-09-11 DIAGNOSIS — D571 Sickle-cell disease without crisis: Secondary | ICD-10-CM | POA: Diagnosis not present

## 2024-09-11 DIAGNOSIS — M24411 Recurrent dislocation, right shoulder: Secondary | ICD-10-CM | POA: Diagnosis not present

## 2024-09-11 DIAGNOSIS — S43006A Unspecified dislocation of unspecified shoulder joint, initial encounter: Secondary | ICD-10-CM | POA: Diagnosis not present

## 2024-09-11 LAB — GLUCOSE, CAPILLARY
Glucose-Capillary: 104 mg/dL — ABNORMAL HIGH (ref 70–99)
Glucose-Capillary: 108 mg/dL — ABNORMAL HIGH (ref 70–99)

## 2024-09-11 MED ORDER — OXYCODONE HCL 5 MG PO TABS: 5.0000 mg | ORAL_TABLET | Freq: Four times a day (QID) | ORAL | 0 refills | Status: DC | PRN

## 2024-09-11 MED ORDER — OXYCODONE HCL 5 MG PO TABS: 5.0000 mg | ORAL_TABLET | Freq: Four times a day (QID) | ORAL | 0 refills | Status: AC | PRN

## 2024-09-11 NOTE — Plan of Care (Signed)
  Problem: Education: Goal: Knowledge of General Education information will improve Description: Including pain rating scale, medication(s)/side effects and non-pharmacologic comfort measures Outcome: Progressing   Problem: Health Behavior/Discharge Planning: Goal: Ability to manage health-related needs will improve Outcome: Progressing   Problem: Clinical Measurements: Goal: Ability to maintain clinical measurements within normal limits will improve Outcome: Progressing   Problem: Nutrition: Goal: Adequate nutrition will be maintained Outcome: Progressing   Problem: Coping: Goal: Level of anxiety will decrease Outcome: Progressing   Problem: Skin Integrity: Goal: Risk for impaired skin integrity will decrease Outcome: Progressing   

## 2024-09-11 NOTE — TOC Progression Note (Addendum)
 Transition of Care [facility]) - Progression Note    Patient Details  Name: Tanya Bell MRN: [id number] Date of Birth: 10-20-1941  Transition of Care [facility]) CM/SW Contact  Bridget Cordella Simmonds, LCSW Phone Number: 09/11/2024, 10:47 AM  Clinical Narrative:   CSW confirmed with [facility] that they can receive pt today.  CSW spoke with pt regarding transportation to SNF.  She will contact her family to see who can assist.   1145: CSW spoke with pt regarding transportation to SNF, she is arranging transportation with family member, they will be here this afternoon.     Expected Discharge Plan:  (vs CIR vs SNF) Barriers to Discharge: No Barriers Identified               Expected Discharge Plan and Services       Living arrangements for the past 2 months: Single Family Home Expected Discharge Date: 09/11/24                       Representative spoke with at DME Agency: [name] ( Daughter ) [phone] , Grayce Paterson  (Daughter)  Emergency Contact  [phone] HH Arranged: RN, PT, OT, Social Work [facility] Agency: [facility] Date [facility] Agency Contacted: 09/04/24 Time HH Agency Contacted: 1524 Representative spoke with at [facility] Agency: [facility]   Social Drivers of Health (SDOH) Interventions SDOH Screenings   Food Insecurity: No Food Insecurity (09/04/2024)  Housing: Low Risk  (09/04/2024)  Transportation Needs: No Transportation Needs (09/04/2024)  Utilities: Not At Risk (09/04/2024)  Alcohol Screen: Low Risk  (07/26/2018)  Depression (PHQ2-9): Low Risk  (07/18/2024)  Financial Resource Strain: Low Risk  (12/14/2017)  Physical Activity: Inactive (12/14/2017)  Social Connections: Moderately Integrated (09/04/2024)  Stress: Stress Concern Present (12/14/2017)  Tobacco Use: Medium Risk (09/03/2024)    Readmission Risk Interventions     No data to display

## 2024-09-11 NOTE — Progress Notes (Signed)
 CSW spoke with pt regarding SDOH: transportation.  Pt reports her husband still drives.  No medicaid.  Contact information for [employer] provided--pt reports they have used their services in the past for her husband, discussed that they can assist with transportation as well. Cathlyn Ferry, MSW, LCSW 9/30/202511:55 AM

## 2024-09-11 NOTE — Discharge Summary (Signed)
 Physician Discharge Summary  Tanya Bell FMW:[id number] DOB: [birth year] DOA: 09/01/2024  PCP: Leonarda Roxan JAYSON, NP  Admit date: 09/01/2024  Discharge date: 09/11/2024  Admitted From: Home  Disposition: SNF  Recommendations for Outpatient Follow-up:  Follow up with PCP in 1-2 weeks. Please obtain BMP/CBC in one week. Advised to follow up orthopaedics as scheduled.  Home Health: None Equipment/Devices: None  Discharge Condition: Stable CODE STATUS:Full code Diet recommendation: Heart Healthy   Brief Summary / Hospital Course: This 83 yrs old Female with HTN, DM, HLD who presented s/p fall at the grocery store. She was evaluated in the ER and found to have dislocation of both shoulders. These were reduced in the ER, and she was kept in the ER overnight for PT evaluation. During that time, She had reduced appetite, likely due to immobility, pain, and opiates, and developed hypotension, hypoxia, and AKI, so she was given IV fluids and admitted to the hospital. Patient has made significant improvement.AKI and dehydration resolved with IV hydration.  Orthopedics recommended nonoperative management,  recommended outpatient follow-up. Expect brachial plexus injury to resolve gradually. Ortho recommended nonweightbearing bilateral upper extremities.  PT and OT recommended skilled nursing facility.  Patient's family will take care of their father so patient will be discharged to rehab today.   Discharge Diagnoses:  Principal Problem:   Shoulder dislocation, unspecified laterality, initial encounter Active Problems:   AKI (acute kidney injury)   Hypoxia   Essential hypertension   Diabetes mellitus type 2, controlled (HCC)   Hyperlipidemia LDL goal <100   History of retinal detachment   Gastroesophageal reflux disease without esophagitis  AKI due to dehydration: Patient had some transient low blood pressure, She was given losartan  in the ER.   She was given IV fluids overnight and  creatinine started to improve.   Baseline creatinine is 0.7, peak creatinine here was 1.6. Continue to Hold losartan .  Encourage oral fluids.  AKI resolved.   Hypoxia due to atelectasis: Now Resolved.   CTA chest ruled out  PE, No airspace disease, No edema. Continue Incentive spirometery   Bilateral dislocated shoulders: Right arm paralysis due to brachial plexus injury: Both Arms reduced in the ER.   Orthopedics recommended nonoperative management, outpatient follow-up. Expect the brachioplexus injury to gradually resolve. - Nonweightbearing bilateral upper extremities. - PT/OT > SNF - Bilateral arm slings is only for comfort. - Analgesics as needed   Hypertension: Blood pressure trending up. - Resume amlodipine , carvedilol , losartan  one by one. - Continue to monitor blood pressure.   Diabetes melitis type II: Glucose controlled. - Continue linagliptin  - Okay to defer sliding scale corrections for now.   Hyperlipidemia: -Continue Zetia    Glaucoma: - Continue Acular , Pred forte   Discharge Instructions  Discharge Instructions     Call MD for:  difficulty breathing, headache or visual disturbances   Complete by: As directed    Call MD for:  persistant dizziness or light-headedness   Complete by: As directed    Call MD for:  persistant nausea and vomiting   Complete by: As directed    Diet - low sodium heart healthy   Complete by: As directed    Diet general   Complete by: As directed    Discharge instructions   Complete by: As directed    Advised to follow up PCP.  Advised to follow up orthopaedics as scheduled.   Increase activity slowly   Complete by: As directed       Allergies as of 09/11/2024  Reactions   Crestor  [rosuvastatin  Calcium ]    Muscle cramps   Latex Other (See Comments)   GLOVES   Sulfa Antibiotics         Medication List     TAKE these medications    amLODipine  5 MG tablet Commonly known as: NORVASC  TAKE 1 TABLET(5 MG)  BY MOUTH DAILY   calcium -vitamin D  500-200 MG-UNIT tablet Commonly known as: OSCAL WITH D Take 1 tablet by mouth 2 (two) times daily. What changed: when to take this   carvedilol  12.5 MG tablet Commonly known as: COREG  TAKE 1 TABLET ONCE DAILY FOR BLOOD PRESSURE   cetirizine 10 MG tablet Commonly known as: ZYRTEC Take 10 mg by mouth at bedtime as needed for allergies.   cyanocobalamin  1000 MCG tablet Commonly known as: VITAMIN B12 Take 1 tablet (1,000 mcg total) by mouth daily.   ELDERBERRY PO Take by mouth 3 (three) times a week.   ezetimibe  10 MG tablet Commonly known as: ZETIA  Take 1 tablet (10 mg total) by mouth daily.   ibuprofen  200 MG tablet Commonly known as: ADVIL  Take 200 mg by mouth as needed for moderate pain (pain score 4-6).   ketorolac  0.5 % ophthalmic solution Commonly known as: ACULAR  Place 1 drop into the right eye 4 (four) times daily.   losartan  100 MG tablet Commonly known as: Cozaar  Take 1 tablet (100 mg total) by mouth daily.   Magnesium  Oxide -Mg Supplement 500 MG Caps Take 2 capsules (1,000 mg total) by mouth at bedtime.   oxyCODONE  5 MG immediate release tablet Commonly known as: Oxy IR/ROXICODONE  Take 1 tablet (5 mg total) by mouth every 6 (six) hours as needed for up to 3 days for severe pain (pain score 7-10).   pantoprazole  40 MG tablet Commonly known as: PROTONIX  Take 1 tablet (40 mg total) by mouth daily.   phenylephrine  0.25 % suppository Commonly known as: (USE for PREPARATION-H) Place 1 suppository rectally as needed for hemorrhoids.   prednisoLONE  acetate 1 % ophthalmic suspension Commonly known as: PRED FORTE  Place 1 drop into the right eye 4 (four) times daily.   saxagliptin  HCl 2.5 MG Tabs tablet Commonly known as: ONGLYZA Take 1 tablet (2.5 mg total) by mouth daily.   senna-docusate 8.6-50 MG tablet Commonly known as: Senokot-S Take 1 tablet by mouth at bedtime.   simethicone  80 MG chewable tablet Commonly known  as: Gas-X Chew 1 tablet (80 mg total) by mouth every 6 (six) hours as needed for flatulence.   sodium chloride  0.65 % Soln nasal spray Commonly known as: OCEAN Place 1 spray into both nostrils as needed for congestion.   witch hazel-glycerin  pad Commonly known as: TUCKS Apply 1 Application topically as needed for itching.   ZINC  PO Take 1 tablet by mouth once a week.               Durable Medical Equipment  (From admission, onward)           Start     Ordered   09/02/24 1136  For home use only DME Walker rolling  Once       Comments: Need bilateral platform extensions and walker Dx shoulder /Arm dislocations  Question Answer Comment  Walker: With 5 Inch Wheels   Patient needs a walker to treat with the following condition Weakness      09/02/24 1137            Contact information for follow-up providers     Sharl Mayo  Belvie, MD. Schedule an appointment as soon as possible for a visit in 1 week.   Specialty: Orthopedic Surgery Contact information: [address] [place] [address] [place] [address] [phone]         Ngetich, Roxan BROCKS, NP. Schedule an appointment as soon as possible for a visit in 1 week.   Specialty: Family Medicine Contact information: [address] [place] [address] [phone]              Contact information for after-discharge care     Destination     [facility] .   Service: Skilled Nursing Contact information: [address] [place]  [address] [phone]                    Allergies  Allergen Reactions   Crestor  [Rosuvastatin  Calcium ]     Muscle cramps   Latex Other (See Comments)    GLOVES   Sulfa Antibiotics     Consultations: Orthopedics   Procedures/Studies: CT Angio Chest PE W and/or Wo Contrast Result Date: 09/03/2024 CLINICAL DATA:  Pulmonary is suspected.  High probability. EXAM: CT ANGIOGRAPHY CHEST WITH CONTRAST  TECHNIQUE: Multidetector CT imaging of the chest was performed using the standard protocol during bolus administration of intravenous contrast. Multiplanar CT image reconstructions and MIPs were obtained to evaluate the vascular anatomy. RADIATION DOSE REDUCTION: This exam was performed according to the departmental dose-optimization program which includes automated exposure control, adjustment of the mA and/or kV according to patient size and/or use of iterative reconstruction technique. CONTRAST:  75mL OMNIPAQUE  IOHEXOL  350 MG/ML SOLN COMPARISON:  None Available. FINDINGS: Cardiovascular: No filling defects within the pulmonary arteries to suggest acute pulmonary embolism. Atherosclerotic calcification of the aorta. Mediastinum/Nodes: No axillary or supraclavicular adenopathy. No mediastinal or hilar adenopathy. No pericardial fluid. Esophagus normal. Lungs/Pleura: No pulmonary infarction. No pneumonia. No pleural fluid. No pneumothorax Upper Abdomen: Limited view of the liver, kidneys, pancreas are unremarkable. Normal adrenal glands. Musculoskeletal: No aggressive osseous lesion. Review of the MIP images confirms the above findings. IMPRESSION: 1. No evidence acute pulmonary embolism. 2. No acute pulmonary parenchymal findings. 3.  Aortic Atherosclerosis (ICD10-I70.0). Electronically Signed   By: Jackquline Boxer M.D.   On: 09/03/2024 14:33   DG Shoulder Right Portable Result Date: 09/01/2024 CLINICAL DATA:  Reduction EXAM: RIGHT SHOULDER - 1 VIEW COMPARISON:  Right shoulder x-ray 09/01/2024 FINDINGS: Alignment is now anatomic. There some acromial joint space narrowing which can be seen with chronic rotator cuff tendinopathy. No distinct fracture identified. The bones are osteopenic. There are moderate degenerative changes of the acromioclavicular joint. IMPRESSION: Alignment is now anatomic. No distinct fracture identified. Electronically Signed   By: Greig Pique M.D.   On: 09/01/2024 21:46   DG Shoulder  Left Portable Result Date: 09/01/2024 CLINICAL DATA:  Reduction of the shoulder EXAM: LEFT SHOULDER COMPARISON:  Left shoulder x-ray 09/01/2024 FINDINGS: Left shoulder is now in anatomic alignment. No fractures are visualized. There are mild degenerative changes of the acromioclavicular joint. Soft tissues are within normal limits. IMPRESSION: Left shoulder is now in anatomic alignment. Electronically Signed   By: Greig Pique M.D.   On: 09/01/2024 21:32   DG Shoulder Right Portable Result Date: 09/01/2024 CLINICAL DATA:  Postreduction EXAM: RIGHT SHOULDER - 1 VIEW COMPARISON:  Right shoulder x-ray 09/01/2024 FINDINGS: There is anterior inferior dislocation of the humeral head, unchanged. Changes of Hill-Sachs injury again noted. There are moderate degenerative changes of the acromioclavicular joint. IMPRESSION:  Anterior inferior dislocation of the humeral head, unchanged. Electronically Signed   By: Greig Pique M.D.   On: 09/01/2024 21:31   DG Pelvis 1-2 Views Result Date: 09/01/2024 CLINICAL DATA:  Trauma, fall EXAM: PELVIS - 1-2 VIEW COMPARISON:  None Available. FINDINGS: No acute bony abnormality. Specifically, no fracture, subluxation, or dislocation. Hip joints and SI joints symmetric. IMPRESSION: No acute bony abnormality. Electronically Signed   By: Franky Crease M.D.   On: 09/01/2024 19:25   DG Shoulder Right Result Date: 09/01/2024 CLINICAL DATA:  Fall, trauma EXAM: RIGHT SHOULDER - 2+ VIEW COMPARISON:  None Available. FINDINGS: There is anterior right shoulder dislocation anteroinferiorly. Mild impaction of the humeral head along the glenoid compatible with Hill-Sachs deformity. Degenerative changes in the [facility] joint. IMPRESSION: Anterior right shoulder dislocation with Hill-Sachs deformity. Electronically Signed   By: Franky Crease M.D.   On: 09/01/2024 19:25   DG Shoulder Left Result Date: 09/01/2024 CLINICAL DATA:  Fall, trauma EXAM: LEFT SHOULDER - 2+ VIEW COMPARISON:  None Available.  FINDINGS: There is anterior inferior left shoulder dislocation. Impaction of the humeral head along the glenoid compatible with Hill-Sachs injury. Degenerative changes in the [facility] joint. IMPRESSION: Anterior left shoulder dislocation with Hill-Sachs deformity. Electronically Signed   By: Franky Crease M.D.   On: 09/01/2024 19:24   DG Chest 1 View Result Date: 09/01/2024 CLINICAL DATA:  Trauma, fall EXAM: CHEST  1 VIEW COMPARISON:  None Available. FINDINGS: Heart and mediastinal contours within normal limits. Aortic atherosclerosis. Bibasilar atelectasis. No effusions. Bilateral shoulder dislocations. IMPRESSION: Bibasilar atelectasis. Bilateral shoulder dislocations. Electronically Signed   By: Franky Crease M.D.   On: 09/01/2024 19:23   CT Head Wo Contrast Result Date: 09/01/2024 EXAM: CT HEAD AND CERVICAL SPINE 09/01/2024 07:02:22 PM TECHNIQUE: CT of the head and cervical spine was performed without the administration of intravenous contrast. Multiplanar reformatted images are provided for review. Automated exposure control, iterative reconstruction, and/or weight based adjustment of the mA/kV was utilized to reduce the radiation dose to as low as reasonably achievable. COMPARISON: None available. CLINICAL HISTORY: Head trauma, minor (Age >= 65y). Pt BIB [facility], fell getting into the car with her husband. Fell onto asphalt onto R elbow and hit chin. Abrasions R knee. C/o bilateral shoulder pain. No thinners. 100 mcg fentanyl  given en route. C collar in place on arrival. FINDINGS: CT HEAD BRAIN AND VENTRICLES: No acute intracranial hemorrhage. No mass effect or midline shift. No abnormal extra-axial fluid collection. No evidence of acute infarct. No hydrocephalus. Subcortical and periventricular small vessel ischemic changes. Age-related atrophy. ORBITS: No acute abnormality. SINUSES AND MASTOIDS: No acute abnormality. SOFT TISSUES AND SKULL: No acute skull fracture. No acute soft tissue abnormality. CT CERVICAL  SPINE BONES AND ALIGNMENT: No acute fracture or traumatic malalignment. DEGENERATIVE CHANGES: Mild degenerative changes of the mid / lower cervical spine, most prominent at C6-7. SOFT TISSUES: No prevertebral soft tissue swelling. IMPRESSION: 1. No acute intracranial abnormality. 2. No traumatic injury to the cervical spine. Electronically signed by: Pinkie Pebbles MD 09/01/2024 07:08 PM EDT RP Workstation: [id number]   CT Cervical Spine Wo Contrast Result Date: 09/01/2024 EXAM: CT HEAD AND CERVICAL SPINE 09/01/2024 07:02:22 PM TECHNIQUE: CT of the head and cervical spine was performed without the administration of intravenous contrast. Multiplanar reformatted images are provided for review. Automated exposure control, iterative reconstruction, and/or weight based adjustment of the mA/kV was utilized to reduce the radiation dose to as low as reasonably achievable. COMPARISON: None available. CLINICAL HISTORY: Head trauma,  minor (Age >= 66y). Pt BIB [facility], fell getting into the car with her husband. Fell onto asphalt onto R elbow and hit chin. Abrasions R knee. C/o bilateral shoulder pain. No thinners. 100 mcg fentanyl  given en route. C collar in place on arrival. FINDINGS: CT HEAD BRAIN AND VENTRICLES: No acute intracranial hemorrhage. No mass effect or midline shift. No abnormal extra-axial fluid collection. No evidence of acute infarct. No hydrocephalus. Subcortical and periventricular small vessel ischemic changes. Age-related atrophy. ORBITS: No acute abnormality. SINUSES AND MASTOIDS: No acute abnormality. SOFT TISSUES AND SKULL: No acute skull fracture. No acute soft tissue abnormality. CT CERVICAL SPINE BONES AND ALIGNMENT: No acute fracture or traumatic malalignment. DEGENERATIVE CHANGES: Mild degenerative changes of the mid / lower cervical spine, most prominent at C6-7. SOFT TISSUES: No prevertebral soft tissue swelling. IMPRESSION: 1. No acute intracranial abnormality. 2. No traumatic injury to the  cervical spine. Electronically signed by: Pinkie Pebbles MD 09/01/2024 07:08 PM EDT RP Workstation: [id number]    Subjective: Patient was seen and examined at bedside. Overnight events noted. Patient reports feeling much improved and wants to be discharged to skilled nursing facility.  Discharge Exam: Vitals:   09/11/24 0343 09/11/24 0740  BP: (!) 151/74 (!) 152/72  Pulse: 100 82  Resp: 15 16  Temp: 97.9 F (36.6 C) 97.6 F (36.4 C)  SpO2: 97% 97%   Vitals:   09/10/24 0505 09/10/24 2015 09/11/24 0343 09/11/24 0740  BP: (!) 172/66 (!) 151/53 (!) 151/74 (!) 152/72  Pulse: 72 74 100 82  Resp: 16  15 16   Temp: 98 F (36.7 C) 97.9 F (36.6 C) 97.9 F (36.6 C) 97.6 F (36.4 C)  TempSrc: Oral Oral Oral   SpO2: 97% 96% 97% 97%  Weight:      Height:        General: Pt is alert, awake, not in acute distress. Cardiovascular: RRR, S1/S2 +, no rubs, no gallops Respiratory: CTA bilaterally, no wheezing, no rhonchi Abdominal: Soft, NT, ND, bowel sounds + Extremities: no edema, no cyanosis    The results of significant diagnostics from this hospitalization (including imaging, microbiology, ancillary and laboratory) are listed below for reference.     Microbiology: No results found for this or any previous visit (from the past 240 hours).   Labs: BNP (last 3 results) Recent Labs    09/03/24 1208  BNP 62.5   Basic Metabolic Panel: Recent Labs  Lab 09/05/24 0419 09/09/24 0519  NA 137 139  K 3.7 4.0  CL 103 105  CO2 25 24  GLUCOSE 115* 113*  BUN 30* 18  CREATININE 0.78 0.64  CALCIUM  8.9 9.1  MG  --  2.0  PHOS  --  4.0   Liver Function Tests: Recent Labs  Lab 09/05/24 0419  AST 18  ALT 16  ALKPHOS 46  BILITOT 1.0  PROT 6.9  ALBUMIN 2.7*   No results for input(s): LIPASE, AMYLASE in the last 168 hours. No results for input(s): AMMONIA in the last 168 hours. CBC: Recent Labs  Lab 09/05/24 0419 09/09/24 0519  WBC 8.0 7.0  HGB 13.3 11.9*  HCT  37.9 34.0*  MCV 74.0* 73.3*  PLT 147* 217   Cardiac Enzymes: No results for input(s): CKTOTAL, CKMB, CKMBINDEX, TROPONINI in the last 168 hours. BNP: Invalid input(s): POCBNP CBG: Recent Labs  Lab 09/10/24 0631 09/10/24 1134 09/10/24 1624 09/10/24 2057 09/11/24 0628  GLUCAP 102* 138* 113* 120* 104*   D-Dimer No results for input(s): DDIMER in the  last 72 hours. Hgb A1c No results for input(s): HGBA1C in the last 72 hours. Lipid Profile No results for input(s): CHOL, HDL, LDLCALC, TRIG, CHOLHDL, LDLDIRECT in the last 72 hours. Thyroid  function studies No results for input(s): TSH, T4TOTAL, T3FREE, THYROIDAB in the last 72 hours.  Invalid input(s): FREET3 Anemia work up No results for input(s): VITAMINB12, FOLATE, FERRITIN, TIBC, IRON, RETICCTPCT in the last 72 hours. Urinalysis    Component Value Date/Time   COLORURINE YELLOW 04/21/2018 1008   APPEARANCEUR CLEAR 04/21/2018 1008   LABSPEC 1.015 06/06/2021 1500   PHURINE 5.5 06/06/2021 1500   GLUCOSEU NEGATIVE 06/06/2021 1500   HGBUR NEGATIVE 06/06/2021 1500   BILIRUBINUR small 05/17/2024 1545   KETONESUR NEGATIVE 06/06/2021 1500   PROTEINUR Negative 05/17/2024 1545   PROTEINUR NEGATIVE 06/06/2021 1500   UROBILINOGEN 0.2 05/17/2024 1545   UROBILINOGEN 0.2 06/06/2021 1500   NITRITE negative 05/17/2024 1545   NITRITE NEGATIVE 06/06/2021 1500   LEUKOCYTESUR Negative 05/17/2024 1545   LEUKOCYTESUR NEGATIVE 06/06/2021 1500   Sepsis Labs Recent Labs  Lab 09/05/24 0419 09/09/24 0519  WBC 8.0 7.0   Microbiology No results found for this or any previous visit (from the past 240 hours).   Time coordinating discharge: Over 30 minutes  SIGNED:   Darcel Dawley, MD  [facility] 09/11/2024, 11:41 AM Pager   If 7PM-7AM, please contact night-coverage

## 2024-09-11 NOTE — Progress Notes (Signed)
 Report called to [facility] rehab.  Amelia Receieved call.  All questions answered for patient.  Family in room to give her a ride to facility.  IV removed, and paperwork placed in DC packet for Receiving facility

## 2024-09-11 NOTE — TOC Transition Note (Signed)
 Transition of Care [facility]) - Discharge Note   Patient Details  Name: Tanya Bell MRN: [id number] Date of Birth: 14-Jul-1941  Transition of [facility]) CM/SW Contact:  Bridget Cordella Simmonds, LCSW Phone Number: 09/11/2024, 12:01 PM   Clinical Narrative:   Pt discharging to [facility], room 105.  RN call report to [phone].  Pt family member will transport, will need pt brought down to main north tower entrance with assistance getting into the vehicle.     Final next level of care: Skilled Nursing Facility Barriers to Discharge: Barriers Resolved   Patient Goals and CMS Choice     Choice offered to / list presented to : Patient      Discharge Placement              Patient chooses bed at: [facility] Patient to be transferred to facility by: family member-pt has not specified Name of family member notified: daughter Grayce Patient and family notified of of transfer: 09/11/24  Discharge Plan and Services Additional resources added to the After Visit Summary for                          Representative spoke with at DME Agency: Alberto Paterson ( Daughter ) [phone] , Grayce Paterson  (Daughter)  Emergency Contact  [phone] HH Arranged: RN, PT, OT, Social Work [facility] Agency: [facility] Date [facility] Agency Contacted: 09/04/24 Time HH Agency Contacted: 1524 Representative spoke with at [facility] Agency: [place]  Social Drivers of Health (SDOH) Interventions SDOH Screenings   Food Insecurity: No Food Insecurity (09/04/2024)  Housing: Low Risk  (09/04/2024)  Transportation Needs: No Transportation Needs (09/04/2024)  Utilities: Not At Risk (09/04/2024)  Alcohol Screen: Low Risk  (07/26/2018)  Depression (PHQ2-9): Low Risk  (07/18/2024)  Financial Resource Strain: Low Risk  (12/14/2017)  Physical Activity: Inactive (12/14/2017)  Social Connections: Moderately Integrated (09/04/2024)  Stress: Stress Concern Present (12/14/2017)  Tobacco Use: Medium Risk (09/03/2024)      Readmission Risk Interventions     No data to display

## 2024-09-12 DIAGNOSIS — E11319 Type 2 diabetes mellitus with unspecified diabetic retinopathy without macular edema: Secondary | ICD-10-CM | POA: Diagnosis not present

## 2024-09-12 DIAGNOSIS — E782 Mixed hyperlipidemia: Secondary | ICD-10-CM | POA: Diagnosis not present

## 2024-09-12 DIAGNOSIS — G540 Brachial plexus disorders: Secondary | ICD-10-CM | POA: Diagnosis not present

## 2024-09-12 DIAGNOSIS — I10 Essential (primary) hypertension: Secondary | ICD-10-CM | POA: Diagnosis not present

## 2024-09-14 DIAGNOSIS — I10 Essential (primary) hypertension: Secondary | ICD-10-CM | POA: Diagnosis not present

## 2024-09-14 DIAGNOSIS — E11319 Type 2 diabetes mellitus with unspecified diabetic retinopathy without macular edema: Secondary | ICD-10-CM | POA: Diagnosis not present

## 2024-09-14 DIAGNOSIS — G540 Brachial plexus disorders: Secondary | ICD-10-CM | POA: Diagnosis not present

## 2024-09-14 DIAGNOSIS — S43006D Unspecified dislocation of unspecified shoulder joint, subsequent encounter: Secondary | ICD-10-CM | POA: Diagnosis not present

## 2024-09-14 DIAGNOSIS — E782 Mixed hyperlipidemia: Secondary | ICD-10-CM | POA: Diagnosis not present

## 2024-09-14 DIAGNOSIS — M6281 Muscle weakness (generalized): Secondary | ICD-10-CM | POA: Diagnosis not present

## 2024-09-14 DIAGNOSIS — R2689 Other abnormalities of gait and mobility: Secondary | ICD-10-CM | POA: Diagnosis not present

## 2024-09-17 DIAGNOSIS — E1142 Type 2 diabetes mellitus with diabetic polyneuropathy: Secondary | ICD-10-CM | POA: Diagnosis not present

## 2024-09-17 DIAGNOSIS — S143XXD Injury of brachial plexus, subsequent encounter: Secondary | ICD-10-CM | POA: Diagnosis not present

## 2024-09-17 DIAGNOSIS — I10 Essential (primary) hypertension: Secondary | ICD-10-CM | POA: Diagnosis not present

## 2024-09-17 DIAGNOSIS — S43004D Unspecified dislocation of right shoulder joint, subsequent encounter: Secondary | ICD-10-CM | POA: Diagnosis not present

## 2024-09-18 DIAGNOSIS — S43085A Other dislocation of left shoulder joint, initial encounter: Secondary | ICD-10-CM | POA: Diagnosis not present

## 2024-09-18 DIAGNOSIS — S143XXA Injury of brachial plexus, initial encounter: Secondary | ICD-10-CM | POA: Diagnosis not present

## 2024-09-18 DIAGNOSIS — I10 Essential (primary) hypertension: Secondary | ICD-10-CM | POA: Diagnosis not present

## 2024-09-18 DIAGNOSIS — S43084A Other dislocation of right shoulder joint, initial encounter: Secondary | ICD-10-CM | POA: Diagnosis not present

## 2024-09-18 DIAGNOSIS — S43004A Unspecified dislocation of right shoulder joint, initial encounter: Secondary | ICD-10-CM | POA: Diagnosis not present

## 2024-09-18 DIAGNOSIS — K219 Gastro-esophageal reflux disease without esophagitis: Secondary | ICD-10-CM | POA: Diagnosis not present

## 2024-09-18 DIAGNOSIS — R2689 Other abnormalities of gait and mobility: Secondary | ICD-10-CM | POA: Diagnosis not present

## 2024-09-18 DIAGNOSIS — S43006D Unspecified dislocation of unspecified shoulder joint, subsequent encounter: Secondary | ICD-10-CM | POA: Diagnosis not present

## 2024-09-18 DIAGNOSIS — M6281 Muscle weakness (generalized): Secondary | ICD-10-CM | POA: Diagnosis not present

## 2024-09-18 DIAGNOSIS — R300 Dysuria: Secondary | ICD-10-CM | POA: Diagnosis not present

## 2024-09-18 DIAGNOSIS — E785 Hyperlipidemia, unspecified: Secondary | ICD-10-CM | POA: Diagnosis not present

## 2024-09-18 DIAGNOSIS — N179 Acute kidney failure, unspecified: Secondary | ICD-10-CM | POA: Diagnosis not present

## 2024-09-18 DIAGNOSIS — E119 Type 2 diabetes mellitus without complications: Secondary | ICD-10-CM | POA: Diagnosis not present

## 2024-09-21 DIAGNOSIS — M6281 Muscle weakness (generalized): Secondary | ICD-10-CM | POA: Diagnosis not present

## 2024-09-21 DIAGNOSIS — S43006D Unspecified dislocation of unspecified shoulder joint, subsequent encounter: Secondary | ICD-10-CM | POA: Diagnosis not present

## 2024-09-21 DIAGNOSIS — R2689 Other abnormalities of gait and mobility: Secondary | ICD-10-CM | POA: Diagnosis not present

## 2024-09-25 DIAGNOSIS — M6281 Muscle weakness (generalized): Secondary | ICD-10-CM | POA: Diagnosis not present

## 2024-09-25 DIAGNOSIS — R2689 Other abnormalities of gait and mobility: Secondary | ICD-10-CM | POA: Diagnosis not present

## 2024-09-25 DIAGNOSIS — S43006D Unspecified dislocation of unspecified shoulder joint, subsequent encounter: Secondary | ICD-10-CM | POA: Diagnosis not present

## 2024-09-28 DIAGNOSIS — R2689 Other abnormalities of gait and mobility: Secondary | ICD-10-CM | POA: Diagnosis not present

## 2024-09-28 DIAGNOSIS — S43006D Unspecified dislocation of unspecified shoulder joint, subsequent encounter: Secondary | ICD-10-CM | POA: Diagnosis not present

## 2024-09-28 DIAGNOSIS — M6281 Muscle weakness (generalized): Secondary | ICD-10-CM | POA: Diagnosis not present

## 2024-10-02 DIAGNOSIS — Z556 Problems related to health literacy: Secondary | ICD-10-CM | POA: Diagnosis not present

## 2024-10-02 DIAGNOSIS — I10 Essential (primary) hypertension: Secondary | ICD-10-CM | POA: Diagnosis not present

## 2024-10-02 DIAGNOSIS — E1142 Type 2 diabetes mellitus with diabetic polyneuropathy: Secondary | ICD-10-CM | POA: Diagnosis not present

## 2024-10-02 DIAGNOSIS — H3321 Serous retinal detachment, right eye: Secondary | ICD-10-CM | POA: Diagnosis not present

## 2024-10-02 DIAGNOSIS — S43004D Unspecified dislocation of right shoulder joint, subsequent encounter: Secondary | ICD-10-CM | POA: Diagnosis not present

## 2024-10-02 DIAGNOSIS — D571 Sickle-cell disease without crisis: Secondary | ICD-10-CM | POA: Diagnosis not present

## 2024-10-02 DIAGNOSIS — Z9181 History of falling: Secondary | ICD-10-CM | POA: Diagnosis not present

## 2024-10-02 DIAGNOSIS — E785 Hyperlipidemia, unspecified: Secondary | ICD-10-CM | POA: Diagnosis not present

## 2024-10-08 ENCOUNTER — Telehealth: Payer: Self-pay

## 2024-10-08 NOTE — Telephone Encounter (Signed)
 Copied from CRM [id number]. Topic: Clinical - Home Health Verbal Orders >> Oct 05, 2024  3:42 PM DeAngela L wrote: Caller/Agency: Leslee calling with [facility]  Callback Number: [phone] secured line  Service Requested: Skilled Nursing Frequency: 1w8 Any new concerns about the patient? No new concerns  But would like to request a bath aide cause the patients right hand is paralyzed from a previous fall

## 2024-10-08 NOTE — Telephone Encounter (Signed)
 Spoke with Leita and gave verbal order for physical therapy, per [facility] standing order

## 2024-10-08 NOTE — Telephone Encounter (Signed)
 Copied from CRM [id number]. Topic: Clinical - Home Health Verbal Orders >> Oct 05, 2024  4:49 PM Chiquita SQUIBB wrote: Caller/Agency: Leita from [facility]  Callback Number: [phone] Service Requested: Physical Therapy Frequency: 1 time a week for  3 weeks Any new concerns about the patient? No

## 2024-10-08 NOTE — Telephone Encounter (Signed)
 Left detail message for Leslee authorizing verbal orders for skilled nursing as requested (per [facility] standing order)

## 2024-10-09 ENCOUNTER — Telehealth: Payer: Self-pay

## 2024-10-09 NOTE — Telephone Encounter (Signed)
 Call returned to [facility] Agency and verbal orders authorized per [employer] standing order

## 2024-10-09 NOTE — Telephone Encounter (Signed)
 Copied from CRM [id number]. Topic: Clinical - Home Health Verbal Orders >> Oct 09, 2024  8:25 AM Farrel B wrote: Caller/Agency: Gay Rushing Number: [phone] Service Requested: Occupational Therapy  Frequency: 1x week @7weeks   Any new concerns about the patient? Would like garments for compression for upper extremity, she's going send documentation for transportation assistance

## 2024-10-10 ENCOUNTER — Ambulatory Visit: Admitting: Podiatry

## 2024-10-10 ENCOUNTER — Telehealth: Payer: Self-pay | Admitting: Family

## 2024-10-10 DIAGNOSIS — H3321 Serous retinal detachment, right eye: Secondary | ICD-10-CM | POA: Diagnosis not present

## 2024-10-10 DIAGNOSIS — I10 Essential (primary) hypertension: Secondary | ICD-10-CM | POA: Diagnosis not present

## 2024-10-10 DIAGNOSIS — E1142 Type 2 diabetes mellitus with diabetic polyneuropathy: Secondary | ICD-10-CM | POA: Diagnosis not present

## 2024-10-10 DIAGNOSIS — Z556 Problems related to health literacy: Secondary | ICD-10-CM | POA: Diagnosis not present

## 2024-10-10 DIAGNOSIS — S43004D Unspecified dislocation of right shoulder joint, subsequent encounter: Secondary | ICD-10-CM | POA: Diagnosis not present

## 2024-10-10 DIAGNOSIS — Z9181 History of falling: Secondary | ICD-10-CM | POA: Diagnosis not present

## 2024-10-10 DIAGNOSIS — E785 Hyperlipidemia, unspecified: Secondary | ICD-10-CM | POA: Diagnosis not present

## 2024-10-10 DIAGNOSIS — D571 Sickle-cell disease without crisis: Secondary | ICD-10-CM | POA: Diagnosis not present

## 2024-10-10 NOTE — Telephone Encounter (Signed)
 [employer] [place] forms completed and signed.Forms placed in outgoing fax box to be faxed as requested.Patient may pickup original forms on upcoming appointment 10/12/2024.

## 2024-10-12 ENCOUNTER — Encounter: Payer: Self-pay | Admitting: Family

## 2024-10-12 ENCOUNTER — Ambulatory Visit: Admitting: Family

## 2024-10-12 VITALS — BP 138/82 | HR 61 | Temp 97.7°F | Resp 18 | Ht 61.0 in | Wt 150.4 lb

## 2024-10-12 DIAGNOSIS — M25511 Pain in right shoulder: Secondary | ICD-10-CM

## 2024-10-12 DIAGNOSIS — E1142 Type 2 diabetes mellitus with diabetic polyneuropathy: Secondary | ICD-10-CM | POA: Diagnosis not present

## 2024-10-12 LAB — BASIC METABOLIC PANEL WITH GFR
BUN: 16 mg/dL (ref 7–25)
CO2: 23 mmol/L (ref 20–32)
Calcium: 9.9 mg/dL (ref 8.6–10.4)
Chloride: 106 mmol/L (ref 98–110)
Creat: 0.65 mg/dL (ref 0.60–0.95)
Glucose, Bld: 109 mg/dL — ABNORMAL HIGH (ref 65–99)
Potassium: 4.4 mmol/L (ref 3.5–5.3)
Sodium: 140 mmol/L (ref 135–146)
eGFR: 88 mL/min/1.73m2 (ref >=60)

## 2024-10-12 LAB — CBC WITH DIFFERENTIAL/PLATELET
Absolute Lymphocytes: 1955 {cells}/uL (ref 850–3900)
Absolute Monocytes: 505 {cells}/uL (ref 200–950)
Basophils Absolute: 41 {cells}/uL (ref 0–200)
Basophils Relative: 0.7 %
Eosinophils Absolute: 342 {cells}/uL (ref 15–500)
Eosinophils Relative: 5.9 %
HCT: 39.2 % (ref 35.0–45.0)
Hemoglobin: 12.4 g/dL (ref 11.7–15.5)
MCH: 25.1 pg — ABNORMAL LOW (ref 27.0–33.0)
MCHC: 31.6 g/dL — ABNORMAL LOW (ref 32.0–36.0)
MCV: 79.4 fL — ABNORMAL LOW (ref 80.0–100.0)
Monocytes Relative: 8.7 %
Neutro Abs: 2958 {cells}/uL (ref 1500–7800)
Neutrophils Relative %: 51 %
Platelets: 188 Thousand/uL (ref 140–400)
RBC: 4.94 Million/uL (ref 3.80–5.10)
RDW: 16.3 % — ABNORMAL HIGH (ref 11.0–15.0)
Total Lymphocyte: 33.7 %
WBC: 5.8 Thousand/uL (ref 3.8–10.8)

## 2024-10-12 LAB — GLUCOSE, POCT (MANUAL RESULT ENTRY): POC Glucose: 136 mg/dL — AB (ref 70–99)

## 2024-10-12 MED ORDER — FREESTYLE LIBRE 3 READER DEVI: 1.0000 | Freq: Three times a day (TID) | 5 refills | Status: DC

## 2024-10-12 MED ORDER — FREESTYLE LIBRE 3 SENSOR MISC: 1.0000 | 5 refills | Status: DC

## 2024-10-12 MED ORDER — IBUPROFEN 800 MG PO TABS: 800.0000 mg | ORAL_TABLET | Freq: Three times a day (TID) | ORAL | 1 refills | Status: AC | PRN

## 2024-10-12 NOTE — Progress Notes (Signed)
 Provider: Roxan Plough FNP-C  Peggy Loge, Roxan BROCKS, NP  Patient Care Team: Inioluwa Boulay, Roxan BROCKS, NP as PCP - General (Family Medicine) Dyane Rush, MD (Inactive) as Consulting Physician (Gastroenterology) Octavia Bruckner, MD as Consulting Physician (Ophthalmology) Elner Arley LABOR, MD as Consulting Physician (Ophthalmology) Georg Gaskins, MD as Referring Physician (Psychiatry) Jearline Hurl (Inactive) (Orthotics)  Extended Emergency Contact Information Primary Emergency Contact: Best,Robin Mobile Phone: [phone] Relation: Daughter Secondary Emergency Contact: JAMES,THOMAS Home Phone: [phone] Relation: Spouse  Code Status:  Full Code  Goals of care: Advanced Directive information    10/12/2024    9:06 AM  Advanced Directives  Does Patient Have a Medical Advance Directive? No  Would patient like information on creating a medical advance directive? No - Patient declined     Chief Complaint  Patient presents with   Follow-up    Discussed the use of AI scribe software for clinical note transcription with the patient, who gave verbal consent to proceed.  History of Present Illness   Tanya Bell is an 83 year old female who presents for follow-up after bilateral shoulder dislocation due to a fall. She is accompanied by her daughter, who is currently staying with her to provide assistance.  She was admitted to the hospital from September 20 to September 11, 2024, following a fall at a grocery store, resulting in bilateral shoulder dislocation. The dislocations were reduced in the emergency room, and she was kept overnight for physical therapy evaluation. During her hospital stay, she experienced reduced appetite, hypotension, hypoxia, and acute kidney injury, which resolved with IV hydration. Orthopedic consultation recommended nonoperative management, non-weight bearing on bilateral upper extremities, and follow-up with physical and occupational therapy. She was  discharged to a skilled nursing facility and later to home on October 02, 2024.  She experiences ongoing discomfort in her right shoulder, which is in a sling, and is unable to lift more than two pounds or perform rotary motions with her left hand. Swelling in her right hand and fingers is managed with ibuprofen  800 mg twice daily. She also reports numbness in the tips of her fingers and soreness in her right hip, attributed to the fall.  Her blood pressure was elevated during her hospital stay, likely due to pain, and she resumed her regular medications upon returning home, including amlodipine , carvedilol , and losartan . She believes one of these medications was increased during her hospital stay due to high blood pressure.  She has not been able to check her blood sugar at home due to difficulty gripping with her hand. Her diabetes medication includes Onglyza 25 mg daily. She is considering using a continuous glucose monitor that her husband can help apply.  She continues to take Zetia  10 mg daily for hyperlipidemia and uses eye drops for a previous torn retina, not for glaucoma as noted in her hospital records. She also takes Zyrtec as needed for allergies, vitamin B12 1000 mcg, and Protonix  daily. She uses Senokot for constipation as needed and has not recently used elderberry supplements.  The fall occurred when she chased a rolling grocery cart, resulting in her being slammed to the ground, hitting her face, and being unable to move. She was assisted by bystanders and transported to the hospital by ambulance. She expresses gratitude for her physical fitness, which she believes helped mitigate the severity of her injuries.    Past Medical History:  Diagnosis Date   Acute upper respiratory infections of unspecified site    Acute upper respiratory infections of unspecified  site    Acute upper respiratory infections of unspecified site    Allergic rhinitis due to pollen    Candidiasis of mouth     Disorder of bone and cartilage, unspecified    Diverticulitis of colon (without mention of hemorrhage)(562.11)    Essential hypertension, benign    Essential hypertension, benign    Extrinsic asthma, unspecified    Herpes simplex with other ophthalmic complications    Other abnormal glucose    Other and unspecified hyperlipidemia    Other and unspecified hyperlipidemia    Other and unspecified hyperlipidemia    Other cataract    Other cataract    Other dystrophy of vulva    Other malaise and fatigue    Rash and other nonspecific skin eruption    Reflux esophagitis    Thrombocytopenia, unspecified    Type II or unspecified type diabetes mellitus without mention of complication, not stated as uncontrolled    Type II or unspecified type diabetes mellitus without mention of complication, not stated as uncontrolled    Unspecified disorder of skin and subcutaneous tissue    Unspecified essential hypertension    Unspecified essential hypertension    Past Surgical History:  Procedure Laterality Date   ABDOMINAL HYSTERECTOMY     CESAREAN SECTION     1960,1964,1971,1972   CYST EXCISION  1974   Pilonidal Cyst excision   EYE SURGERY Right 04/14/2017   Dr. Elner   KNEE ARTHROSCOPY Right    for meniscal tear   RETINAL TEAR REPAIR CRYOTHERAPY  10/21/2017   Getting injections in right eye    Allergies  Allergen Reactions   Crestor  [Rosuvastatin  Calcium ]     Muscle cramps   Latex Other (See Comments)    GLOVES   Sulfa Antibiotics     Outpatient Encounter Medications as of 10/12/2024  Medication Sig   amLODipine  (NORVASC ) 5 MG tablet TAKE 1 TABLET(5 MG) BY MOUTH DAILY   calcium -vitamin D  (OSCAL WITH D) 500-200 MG-UNIT per tablet Take 1 tablet by mouth 2 (two) times daily. (Patient taking differently: Take 1 tablet by mouth daily.)   carvedilol  (COREG ) 12.5 MG tablet TAKE 1 TABLET ONCE DAILY FOR BLOOD PRESSURE   cetirizine (ZYRTEC) 10 MG tablet Take 10 mg by mouth at bedtime as  needed for allergies.   Continuous Glucose Receiver (FREESTYLE LIBRE 3 READER) DEVI 1 Device by Does not apply route 3 (three) times daily.   Continuous Glucose Sensor (FREESTYLE LIBRE 3 SENSOR) MISC 1 Device by Does not apply route 3 (three) times a week. Place 1 sensor on the skin every 14 days. Use to check glucose continuously   cyanocobalamin  (VITAMIN B12) 1000 MCG tablet Take 1 tablet (1,000 mcg total) by mouth daily.   ELDERBERRY PO Take by mouth 3 (three) times a week.   ezetimibe  (ZETIA ) 10 MG tablet Take 1 tablet (10 mg total) by mouth daily.   ibuprofen  (ADVIL ) 800 MG tablet Take 1 tablet (800 mg total) by mouth every 8 (eight) hours as needed.   ketorolac  (ACULAR ) 0.5 % ophthalmic solution Place 1 drop into the right eye 4 (four) times daily.   losartan  (COZAAR ) 100 MG tablet Take 1 tablet (100 mg total) by mouth daily.   Magnesium  Oxide -Mg Supplement 500 MG CAPS Take 2 capsules (1,000 mg total) by mouth at bedtime.   Multiple Vitamins-Minerals (ZINC  PO) Take 1 tablet by mouth once a week.   pantoprazole  (PROTONIX ) 40 MG tablet Take 1 tablet (40 mg total) by mouth  daily.   phenylephrine  (,USE FOR PREPARATION-H,) 0.25 % suppository Place 1 suppository rectally as needed for hemorrhoids.   prednisoLONE  acetate (PRED FORTE ) 1 % ophthalmic suspension Place 1 drop into the right eye 4 (four) times daily.    saxagliptin  HCl (ONGLYZA) 2.5 MG TABS tablet Take 1 tablet (2.5 mg total) by mouth daily.   senna-docusate (SENOKOT-S) 8.6-50 MG tablet Take 1 tablet by mouth at bedtime.   simethicone  (GAS-X) 80 MG chewable tablet Chew 1 tablet (80 mg total) by mouth every 6 (six) hours as needed for flatulence.   sodium chloride  (OCEAN) 0.65 % SOLN nasal spray Place 1 spray into both nostrils as needed for congestion.   witch hazel-glycerin  (TUCKS) pad Apply 1 Application topically as needed for itching.   [DISCONTINUED] ibuprofen  (ADVIL ) 200 MG tablet Take 200 mg by mouth as needed for moderate pain  (pain score 4-6).   No facility-administered encounter medications on file as of 10/12/2024.    Review of Systems  Constitutional:  Negative for appetite change, chills, fatigue, fever and unexpected weight change.  HENT:  Negative for congestion, dental problem, ear discharge, ear pain, facial swelling, hearing loss, nosebleeds, postnasal drip, rhinorrhea, sinus pressure, sinus pain, sneezing, sore throat, tinnitus and trouble swallowing.   Eyes:  Negative for pain, discharge, redness, itching and visual disturbance.  Respiratory:  Negative for cough, chest tightness, shortness of breath and wheezing.   Cardiovascular:  Negative for chest pain, palpitations and leg swelling.  Gastrointestinal:  Negative for abdominal distention, abdominal pain, blood in stool, constipation, diarrhea, nausea and vomiting.  Endocrine: Negative for cold intolerance, heat intolerance, polydipsia, polyphagia and polyuria.  Genitourinary:  Negative for difficulty urinating, dysuria, flank pain, frequency and urgency.  Musculoskeletal:  Positive for arthralgias. Negative for back pain, gait problem, joint swelling, myalgias, neck pain and neck stiffness.       Shoulder pain   Skin:  Negative for color change, pallor, rash and wound.  Neurological:  Negative for dizziness, syncope, speech difficulty, weakness, light-headedness, numbness and headaches.  Hematological:  Does not bruise/bleed easily.  Psychiatric/Behavioral:  Negative for agitation, behavioral problems, confusion, hallucinations, self-injury, sleep disturbance and suicidal ideas. The patient is not nervous/anxious.     Immunization History  Administered Date(s) Administered   Fluad Quad(high Dose 65+) 08/29/2019, 09/02/2020, 09/28/2021, 08/23/2022   Fluad Trivalent(High Dose 65+) 10/10/2023   INFLUENZA, HIGH DOSE SEASONAL PF 08/16/2018   Influenza Split 08/25/2010, 08/26/2011, 10/04/2012   Influenza Whole 09/24/2009   Influenza,inj,Quad PF,6+ Mos  09/11/2013, 09/04/2014, 11/12/2015, 10/04/2016, 09/06/2017   Influenza-Unspecified 09/02/2018   Moderna Covid-19 Fall Seasonal Vaccine 41yrs & older 08/19/2023   PFIZER(Purple Top)SARS-COV-2 Vaccination 02/11/2020, 03/03/2020, 09/17/2020, 07/04/2021, 10/09/2021   Pneumococcal Conjugate-13 02/17/2012   Pneumococcal Polysaccharide-23 05/19/2016   Tdap 02/17/2012   Zoster Recombinant(Shingrix) 10/15/2021, 02/02/2022   Pertinent  Health Maintenance Due  Topic Date Due   Influenza Vaccine  12/11/2024 (Originally 07/13/2024)   HEMOGLOBIN A1C  01/10/2025   FOOT EXAM  01/15/2025   OPHTHALMOLOGY EXAM  02/15/2025   DEXA SCAN  Completed      01/16/2024    9:02 AM 01/26/2024   10:58 AM 03/23/2024    1:26 PM 07/18/2024    9:53 AM 10/12/2024    9:05 AM  Fall Risk  Falls in the past year? 0 0 0 0 1  Was there an injury with Fall? 0 0 0 0 1  Fall Risk Category Calculator 0 0 0 0 2  Patient at Risk for Falls Due to  No Fall Risks No Fall Risks No Fall Risks  Fall risk Follow up   Falls evaluation completed Falls evaluation completed Falls evaluation completed   Functional Status Survey:    Vitals:   10/12/24 0908  BP: 138/82  Pulse: 61  Resp: 18  Temp: 97.7 F (36.5 C)  SpO2: 98%  Weight: 150 lb 6.4 oz (68.2 kg)  Height: 5' 1 (1.549 m)   Body mass index is 28.42 kg/m. Physical Exam  VITALS: T- 97.7, P- 61, BP- 138/82, SaO2- 98% GENERAL: Alert, cooperative, well developed, no acute distress. HEENT: Normocephalic, normal oropharynx, moist mucous membranes. CHEST: Clear to auscultation bilaterally, no wheezes, rhonchi, or crackles. CARDIOVASCULAR: Regular rate and rhythm, S1 and S2 normal without murmurs. ABDOMEN: Soft, non-tender, non-distended, without organomegaly, normal bowel sounds. EXTREMITIES: No cyanosis, edema, or deformity. MUSCULOSKELETAL: Knee scarred but healed well. NEUROLOGICAL: Cranial nerves grossly intact, moves all extremities without gross motor or sensory deficit.   SKIN: No rash,no lesion or erythema   PSYCHIATRY/BEHAVIORAL: Mood stable   Labs reviewed: Recent Labs    09/04/24 0430 09/05/24 0419 09/09/24 0519  NA 135 137 139  K 3.7 3.7 4.0  CL 103 103 105  CO2 25 25 24   GLUCOSE 122* 115* 113*  BUN 36* 30* 18  CREATININE 1.15* 0.78 0.64  CALCIUM  8.7* 8.9 9.1  MG  --   --  2.0  PHOS  --   --  4.0   Recent Labs    07/10/24 0906 09/03/24 1220 09/05/24 0419  AST 13 16 18   ALT 13 15 16   ALKPHOS  --  49 46  BILITOT 0.6 1.1 1.0  PROT 7.9 7.0 6.9  ALBUMIN  --  2.9* 2.7*   Recent Labs    07/10/24 0906 09/01/24 1736 09/03/24 1220 09/03/24 1253 09/04/24 0430 09/05/24 0419 09/09/24 0519  WBC 5.7 11.1* 10.4  --  10.1 8.0 7.0  NEUTROABS 2,428 7.2 7.0  --   --   --   --   HGB 13.9 13.6 12.5   < > 11.7* 13.3 11.9*  HCT 44.0 39.4 36.1   < > 33.9* 37.9 34.0*  MCV 79.9* 75.3* 74.7*  --  74.2* 74.0* 73.3*  PLT 168 177 146*  --  137* 147* 217   < > = values in this interval not displayed.   Lab Results  Component Value Date   TSH 1.04 07/10/2024   Lab Results  Component Value Date   HGBA1C 6.2 (H) 07/10/2024   Lab Results  Component Value Date   CHOL 182 07/10/2024   HDL 60 07/10/2024   LDLCALC 108 (H) 07/10/2024   TRIG 46 07/10/2024   CHOLHDL 3.0 07/10/2024    Significant Diagnostic Results in last 30 days:  No results found.  Assessment/Plan  Bilateral shoulder dislocation, status post reduction with right shoulder pain, swelling, and bilateral brachial plexus injury Status post bilateral shoulder dislocation with reduction in the ER. Right shoulder pain and swelling persist, with limitations in movement and weight-bearing. Bilateral brachial plexus injury expected to resolve gradually. Nonoperative management recommended by orthopedics. Pain managed with ibuprofen , but swelling persists. - Continue nonoperative management as recommended by orthopedics. - Keep right arm in a sling and avoid lifting over two pounds or  rotary motion. - Prescribe ibuprofen  800 mg for pain and swelling, to be taken three times daily with meals as needed. - Follow up with orthopedics on November 7th. - Continue physical and occupational therapy. - Consider a smaller sling or  padding to reduce neck strain.  Type 2 diabetes mellitus with diabetic polyneuropathy Diabetes management complicated by inability to check blood glucose due to hand limitations. Blood glucose was elevated during hospital stay due to inappropriate diet. Unable to use traditional glucose monitoring due to hand issues. Discussed alternative glucose monitoring options. - Order Freestyle Libre 3 for continuous glucose monitoring, pending insurance approval. - Advise husband or daughter to assist with sensor placement. - Reinforce importance of diet management for blood glucose control.  Hypertension Hypertension management complicated by recent pain and stress from shoulder dislocation, leading to elevated blood pressure. Blood pressure has improved since returning to home medication regimen. Current blood pressure is 138/82, slightly elevated from baseline. - Continue current antihypertensive regimen: amlodipine , carvedilol , and losartan .  Hyperlipidemia Hyperlipidemia management ongoing with Zetia . - Continue Zetia  10 mg daily.   Family/ staff Communication: Reviewed plan of care with patient verbalized understanding   Labs/tests ordered:  - CBC/diff  - BMP  Next Appointment: Return if symptoms worsen or fail to improve.   Total time: 30 minutes. Greater than 50% of total time spent doing patient education regarding T2DM,HTN, HLD,right shoulder pain,health maintenance including symptom/medication management.   Roxan JAYSON Plough, NP

## 2024-10-12 NOTE — Addendum Note (Signed)
 Addended by: LELON, Lyris Hitchman L on: 10/12/2024 10:03 AM   Modules accepted: Orders

## 2024-10-12 NOTE — Patient Instructions (Signed)
 1.Report to local pharmacy to receive Covid vaccine.

## 2024-10-12 NOTE — Telephone Encounter (Signed)
 Thank You.

## 2024-10-18 DIAGNOSIS — G540 Brachial plexus disorders: Secondary | ICD-10-CM | POA: Diagnosis not present

## 2024-10-18 DIAGNOSIS — M25512 Pain in left shoulder: Secondary | ICD-10-CM | POA: Diagnosis not present

## 2024-10-18 DIAGNOSIS — M25511 Pain in right shoulder: Secondary | ICD-10-CM | POA: Diagnosis not present

## 2024-10-19 ENCOUNTER — Telehealth: Payer: Self-pay

## 2024-10-19 NOTE — Telephone Encounter (Signed)
 Form was faxed again and confirmation received that fax went through. Patient aware and Edsel Molt aware

## 2024-10-19 NOTE — Telephone Encounter (Signed)
 Copied from CRM #[id number]. Topic: Clinical - Medical Advice >> Oct 19, 2024  1:24 PM Alfonso ORN wrote: Reason for CRM: patient and Edsel Molt social worker checking on status of form that was suppose to be completed for patient was faxed on 10/12/24  transportation  Edsel Molt social worker with  [employer] assisting with transportation  The name of the form is access [place] Please contact Edsel Molt (social worker)[phone]

## 2024-10-22 ENCOUNTER — Other Ambulatory Visit: Payer: Self-pay | Admitting: *Deleted

## 2024-10-22 DIAGNOSIS — I10 Essential (primary) hypertension: Secondary | ICD-10-CM

## 2024-10-22 NOTE — Patient Outreach (Signed)
  10/22/2024  DALAYZA ZAMBRANA [birth year] [id number]   Telephone call made to Mrs. Best-James for follow up from EMMI notification. Mrs. Lebeck recently discharged from [facility]. Patient identifiers confirmed.   Mrs. Cerveny endorses having PCP follow up since discharge from [facility]. Denies any concerns regarding discharge instructions. Currently active with [facility].   Mrs. Parkin states she has transportation concerns. States [facility] is supposed to pick her up but she is told she is out of the area. Writer offered to [employer] on  a 3-way call to gain clarification. Mrs. Paolillo declines writer's involvement at this time. Asked that writer call back at later time to discuss transportation concerns.   Discussed [facility] CCM services and follow up. Mrs. Parsell is agreeable.   Mrs. Mackley has medical history of HTN, HLD, DM, GERD, retinal detachment.   Will refer to [facility] CCM team.   Pablo Hurst, MSN, RN, BSN [place]  [facility], [facility] RN Post- Acute Care Manager Direct Dial: [phone]

## 2024-10-23 ENCOUNTER — Ambulatory Visit: Payer: Self-pay | Admitting: Family

## 2024-10-25 ENCOUNTER — Ambulatory Visit: Admitting: Gastroenterology

## 2024-11-05 ENCOUNTER — Ambulatory Visit (INDEPENDENT_AMBULATORY_CARE_PROVIDER_SITE_OTHER): Payer: Self-pay | Admitting: Podiatry

## 2024-11-05 DIAGNOSIS — Z91199 Patient's noncompliance with other medical treatment and regimen due to unspecified reason: Secondary | ICD-10-CM

## 2024-11-05 NOTE — Progress Notes (Signed)
 No show

## 2024-11-12 ENCOUNTER — Telehealth: Payer: Self-pay | Admitting: *Deleted

## 2024-11-12 NOTE — Telephone Encounter (Signed)
 Copied from CRM #[id number]. Topic: Clinical - Home Health Verbal Orders >> Nov 12, 2024  4:06 PM Debby BROCKS wrote: Caller/Agency: [facility] Number: [phone] Service Requested: Occupational Therapy Frequency: 2 times a week for 3 weeks  Any new concerns about the patient? Yes, concerns regarding Ibuprofen  and gabapentin . If there are side effects in taking both at the same time

## 2024-11-12 NOTE — Telephone Encounter (Signed)
 Paperwork received and placed in Tanya Bell's folder to review and sign.

## 2024-11-12 NOTE — Telephone Encounter (Signed)
 [employer] [place] Form completed.placed on outgoing fax box to be faxed as requested.

## 2024-11-12 NOTE — Telephone Encounter (Signed)
 Copied from CRM [id number]. Topic: General - Other >> Nov 12, 2024  9:30 AM Tanya Bell ORN wrote: Reason for CRM: Patient, along with her daughter, Tanya Bell, called in stating that the doctor did not complete part B of page 3. States this paperwork needs to be completed ASAP, by today, if possible so that her mother can be approved for curbside pick up with [employer]. Her name is listed at the bottom of page 3 and it needs the provider's signature, license number and to answer 3 questions. Tanya Bell is faxing it over again to clinic's fax number, which was provided.

## 2024-11-12 NOTE — Telephone Encounter (Signed)
Awaiting paperwork to be faxed. 

## 2024-11-13 ENCOUNTER — Telehealth: Payer: Self-pay

## 2024-11-13 NOTE — Telephone Encounter (Signed)
Duplicate communication.

## 2024-11-13 NOTE — Telephone Encounter (Signed)
 Copied from CRM [id number]. Topic: General - Other >> Nov 12, 2024  9:30 AM Susanna ORN wrote: Reason for CRM: Patient, along with her daughter, Denette, called in stating that the doctor did not complete part B of page 3. States this paperwork needs to be completed ASAP, by today, if possible so that her mother can be approved for curbside pick up with [employer]. Her name is listed at the bottom of page 3 and it needs the provider's signature, license number and to answer 3 questions. Denette is faxing it over again to clinic's fax number, which was provided. >> Nov 12, 2024  4:39 PM DeAngela L wrote: Patient calling to see if the office received the fax sent today  Informed the patient Paperwork received and placed in Dinah's folder to review and sign.  The patient is asking if this can please be completed as soon as possible for transportation concerns   Pt num [phone] (M)

## 2024-11-14 ENCOUNTER — Telehealth: Payer: Self-pay

## 2024-11-14 NOTE — Telephone Encounter (Signed)
 Mychart message sent to patient regarding medication question  Call returned to Zacharia to give verbal authorization, no answer. Left voicemail requesting return call

## 2024-11-14 NOTE — Telephone Encounter (Signed)
 Copied from CRM #[id number]. Topic: Clinical - Home Health Verbal Orders >> Nov 12, 2024  4:06 PM Debby BROCKS wrote: Caller/Agency: [facility] Number: [phone] Service Requested: Occupational Therapy Frequency: 2 times a week for 3 weeks Any new concerns about the patient? Yes, concerns regarding Ibuprofen  and gabapentin . If there are side effects in taking both at the same time >> Nov 14, 2024 11:18 AM Chiquita SQUIBB wrote: Zacharia Is calling in asking again if the patient can take pain medications and gabapentin  together and If there are side effects in taking both at the same time. Please contact the patient back at [phone].

## 2024-11-14 NOTE — Telephone Encounter (Signed)
 Continue with Ibuprofen  as needed for pain.Patient has no Gabapentin  on current medication list.Was Gabapentin  prescribed by another Provider?

## 2024-11-16 NOTE — Telephone Encounter (Signed)
 Patient agreed and had no questions

## 2024-11-16 NOTE — Telephone Encounter (Signed)
 Spoke with patient this morning in regards to Ngetich, Dinah C, NP medication question. Patient stated that the Ortho doctor prescribe the gabapentin  for the patient and want to know if she can take the medication together which is not current on her medication list.    Message sent to Ngetich, Dinah C, NP

## 2024-11-16 NOTE — Telephone Encounter (Signed)
 No significant interaction. Just have to be causation with Gabapentin  if it makes you drowsy.

## 2024-11-16 NOTE — Telephone Encounter (Signed)
 Patient had no questions but she does have a question about her freestyle libre, what does she need to do when takes a shower because the tape is not sticking. Please advise   Message sent to Ngetich, Dinah C, NP

## 2024-11-16 NOTE — Telephone Encounter (Signed)
 May wrap free style libre area with serene plastic wrap when showering.

## 2024-11-19 ENCOUNTER — Telehealth: Payer: Self-pay | Admitting: *Deleted

## 2024-11-19 NOTE — Telephone Encounter (Signed)
 Copied from CRM [id number]. Topic: General - Other >> Nov 19, 2024  3:06 PM Chiquita SQUIBB wrote: Reason for CRM: Tanya Bell is calling in stating that  the most recent fax that was sent over to [facility], was cut off and they were unable to see the whole thing, she is asking for it to be re faxed.

## 2024-11-19 NOTE — Telephone Encounter (Signed)
 Forwarded to Admin to refax.

## 2024-11-19 NOTE — Telephone Encounter (Signed)
 This has been refaxed

## 2024-11-22 ENCOUNTER — Telehealth: Payer: Self-pay

## 2024-11-22 NOTE — Progress Notes (Signed)
 Complex Care Management Note  Care Guide Note 11/22/2024 Name: Tanya Bell MRN: [id number] DOB: [birth year]  [name] is a 83 y.o. year old female who sees Ngetich, Dinah C, NP for primary care. I reached out to [facility] by phone today to offer complex care management services.  [name] was given information about Complex Care Management services today including:   The Complex Care Management services include support from the care team which includes your Nurse Care Manager, Clinical Social Worker, or Pharmacist.  The Complex Care Management team is here to help remove barriers to the health concerns and goals most important to you. Complex Care Management services are voluntary, and the patient may decline or stop services at any time by request to their care team member.   Complex Care Management Consent Status: Patient agreed to services and verbal consent obtained.   Follow up plan:  Telephone appointment with complex care management team member scheduled for:  [name] 11/27/24 and [name] 12/17/24  Encounter Outcome:  Patient Scheduled  .Debbe Fuse [facility], [facility] Guide  Direct Dial: [phone]  Fax [phone]

## 2024-11-27 ENCOUNTER — Other Ambulatory Visit: Payer: Self-pay | Admitting: Licensed Clinical Social Worker

## 2024-11-27 NOTE — Patient Outreach (Addendum)
 Social Drivers of Health  Community Resource and Care Coordination Visit Note   11/27/2024  Name: Tanya Bell MRN: [id number] DOB:06-Apr-1941  Situation: Referral received for [facility] needs assessment and assistance related to Transportation. I obtained verbal consent from Patient.  Visit completed with Patient on the phone.   Background:      Assessment:   Goals Addressed             This Visit's Progress    [name] VBCI Social Work Care Plan       Current SDOH Barriers:  Transportation  Interventions: Patient interviewed and appropriate screenings performed Patient has an application in with [facility] Access and waiting for approval.          Recommendation:   attend all scheduled provider appointments Continue to wait for approval fem [place] Access they are supposed to get back with the patient on or before 12/07/2024. SW will complete the CCM on the follow up,patient did not have time to complete today.   Follow Up Plan:   Telephone follow up appointment date/time:  12/14/2024 at 2:00 pm  Tobias CHARM Maranda HEDWIG, PhD [facility], [facility] Social Worker Direct Dial: [phone]  Fax: [phone]

## 2024-11-27 NOTE — Patient Instructions (Signed)
 Visit Information  Thank you for taking time to visit with me today. Please don't hesitate to contact me if I can be of assistance to you before our next scheduled appointment.  Our next appointment is by telephone on 12/14/2024 at 2:00 pm Please call the care guide team at [phone] if you need to cancel or reschedule your appointment.   Following is a copy of your care plan:   Goals Addressed             This Visit's Progress    [name] VBCI Social Work Care Plan       Current SDOH Barriers:  Transportation  Interventions: Patient interviewed and appropriate screenings performed Patient has an application in with [facility] Access and waiting for approval.          Please call the Suicide and Crisis Lifeline: 988 go to [facility] [address] [place] [phone]) call 911 if you are experiencing a Mental Health or Behavioral Health Crisis or need someone to talk to.  Patient verbalized understanding of Care plan and visit instructions communicated this visit  Tobias CHARM Maranda HEDWIG, PhD [facility], [facility] Social Worker Direct Dial: [phone]  Fax: [phone]

## 2024-11-30 ENCOUNTER — Telehealth: Payer: Self-pay

## 2024-11-30 NOTE — Telephone Encounter (Signed)
 Verbal consent given to Desahria with [facility] ,for occupational therapy for [employer].   Thanks.

## 2024-11-30 NOTE — Telephone Encounter (Signed)
 Copied from CRM #[id number]. Topic: Clinical - Home Health Verbal Orders >> Nov 30, 2024 10:21 AM Graeme ORN wrote: Caller/Agency: Desahria with [facility] (Provider) Callback Number: [phone] Service Requested: Occupational Therapy Frequency: Plan of care approval - 2 times per week for two weeks and then one time a week for 6 weeks.  Any new concerns about the patient? No

## 2024-12-07 ENCOUNTER — Telehealth: Payer: Self-pay

## 2024-12-07 NOTE — Telephone Encounter (Signed)
 Copied from CRM [id number]. Topic: General - Other >> Dec 07, 2024 10:38 AM DeAngela L wrote: Reason for CRM: Andrea Mose Occupation al Therapist with [facility] health calling to let the provider know the patient missed an OC appointment on 11/23/2024  Andrea  [phone] secured line

## 2024-12-14 ENCOUNTER — Other Ambulatory Visit: Payer: Self-pay | Admitting: Licensed Clinical Social Worker

## 2024-12-14 NOTE — Patient Outreach (Signed)
 Social Drivers of Health  Community Resource and Care Coordination Visit Note   12/14/2024  Name: Tanya Bell MRN: [id number] DOB:02/03/1941  Situation: Referral received for [facility] needs assessment and assistance related to Transportation. I obtained verbal consent from Patient.  Visit completed with Patient on the phone.   Background:      Assessment:   Goals Addressed             This Visit's Progress    COMPLETED: [name] VBCI Social Work Care Plan       Current SDOH Barriers:  Transportation  Interventions: Patient interviewed and appropriate screenings performed Patient has an application in with [facility] Access and waiting for approval.          Recommendation:   attend all scheduled provider appointments call for transportation assistance at least one week before appointments Patient received a letter that stated that she was APPROVED for the transportation through [employer]. SW will close out case on today and did remind patient about upcoming appointment with th RNCM on 12/17/2024  Follow Up Plan:   Patient has achieved all patient stated goals. [employer] will be closed. Patient has been provided contact information should new needs arise.   Tobias CHARM Maranda HEDWIG, PhD [place], [facility] Social Worker Direct Dial: [phone]  Fax: [phone]

## 2024-12-14 NOTE — Patient Instructions (Signed)

## 2024-12-17 ENCOUNTER — Telehealth

## 2024-12-17 DIAGNOSIS — I10 Essential (primary) hypertension: Secondary | ICD-10-CM

## 2024-12-17 DIAGNOSIS — M7989 Other specified soft tissue disorders: Secondary | ICD-10-CM

## 2024-12-17 DIAGNOSIS — E1142 Type 2 diabetes mellitus with diabetic polyneuropathy: Secondary | ICD-10-CM

## 2024-12-17 NOTE — Patient Outreach (Signed)
 Complex Care Management   Visit Note  12/17/2024  Name:  Tanya Bell MRN: [id number] DOB: [birth year]  Situation:H Referral received for Complex Care Management related to Type 2 diabetes mellitus with diabetic polyneuropathy, without long-term current use of insulin, Essential Hypertension, GERD w/o esophagitis, chronic pain to both hands.  I obtained verbal consent from Patient.  Visit completed with Patient on the phone.  Background:   Past Medical History:  Diagnosis Date   Acute upper respiratory infections of unspecified site    Acute upper respiratory infections of unspecified site    Acute upper respiratory infections of unspecified site    Allergic rhinitis due to pollen    Candidiasis of mouth    Disorder of bone and cartilage, unspecified    Diverticulitis of colon (without mention of hemorrhage)(562.11)    Essential hypertension, benign    Essential hypertension, benign    Extrinsic asthma, unspecified    Herpes simplex with other ophthalmic complications    Other abnormal glucose    Other and unspecified hyperlipidemia    Other and unspecified hyperlipidemia    Other and unspecified hyperlipidemia    Other cataract    Other cataract    Other dystrophy of vulva    Other malaise and fatigue    Rash and other nonspecific skin eruption    Reflux esophagitis    Thrombocytopenia, unspecified    Type II or unspecified type diabetes mellitus without mention of complication, not stated as uncontrolled    Type II or unspecified type diabetes mellitus without mention of complication, not stated as uncontrolled    Unspecified disorder of skin and subcutaneous tissue    Unspecified essential hypertension    Unspecified essential hypertension     Assessment: Patient Reported Symptoms:  Cognitive Cognitive Status: Alert and oriented to person, place, and time, Normal speech and language skills Cognitive/Intellectual Conditions Management [RPT]: None reported or  documented in medical history or problem list   Health Maintenance Behaviors: Annual physical exam, Healthy diet, Immunizations, Exercise Health Facilitated by: Pain control, Healthy diet, Rest  Neurological Neurological Review of Symptoms: Dizziness, Numbness Neurological Management Strategies: Exercise, Routine screening Neurological Self-Management Outcome: 4 (good)  HEENT HEENT Symptoms Reported: Nasal discharge HEENT Management Strategies: Medication therapy, Routine screening HEENT Self-Management Outcome: 4 (good)    Cardiovascular Cardiovascular Symptoms Reported: Fatigue Does patient have uncontrolled Hypertension?: No Cardiovascular Management Strategies: Adequate rest, Medication therapy, Routine screening Cardiovascular Self-Management Outcome: 4 (good)  Respiratory Respiratory Symptoms Reported: Dry cough Respiratory Management Strategies: Routine screening, Diet modification Respiratory Self-Management Outcome: 4 (good)  Endocrine      Gastrointestinal Gastrointestinal Symptoms Reported: Unintentional weight gain, Reflux/heartburn, Constipation Gastrointestinal Management Strategies: Medication therapy, Diet modification Gastrointestinal Self-Management Outcome: 4 (good)    Genitourinary Genitourinary Symptoms Reported: Frequency Genitourinary Management Strategies: Fluid modification Genitourinary Self-Management Outcome: 4 (good)  Integumentary Integumentary Symptoms Reported: Other, Itching Other Integumentary Symptoms: calluses to both feet Skin Management Strategies: Routine screening Skin Self-Management Outcome: 4 (good)  Musculoskeletal Musculoskelatal Symptoms Reviewed: Limited mobility, Muscle pain, Unsteady gait Musculoskeletal Management Strategies: Adequate rest, Routine screening, Exercise Musculoskeletal Self-Management Outcome: 4 (good) Falls in the past year?: Yes Number of falls in past year: 1 or less Was there an injury with Fall?: Yes Fall Risk  Category Calculator: 2 Patient Fall Risk Level: Moderate Fall Risk Patient at Risk for Falls Due to: History of fall(s), Impaired balance/gait, Impaired mobility Fall risk Follow up: Falls evaluation completed, Education provided, Falls prevention discussed  Psychosocial Psychosocial Symptoms Reported: No  symptoms reported   Major Change/Loss/Stressor/Fears (CP): Medical condition, self, Medical condition, family Techniques to [place] with Loss/Stress/Change: Spiritual practice(s) Quality of Family Relationships: helpful, involved, supportive Do you feel physically threatened by others?: No    12/17/2024    PHQ2-9 Depression Screening   Neeraj Housand interest or pleasure in doing things    Feeling down, depressed, or hopeless    PHQ-2 - Total Score    Trouble falling or staying asleep, or sleeping too much    Feeling tired or having Nataki Mccrumb energy    Poor appetite or overeating     Feeling bad about yourself - or that you are a failure or have let yourself or your family down    Trouble concentrating on things, such as reading the newspaper or watching television    Moving or speaking so slowly that other people could have noticed.  Or the opposite - being so fidgety or restless that you have been moving around a lot more than usual    Thoughts that you would be better off dead, or hurting yourself in some way    PHQ2-9 Total Score    If you checked off any problems, how difficult have these problems made it for you to do your work, take care of things at home, or get along with other people    Depression Interventions/Treatment      There were no vitals filed for this visit. Pain Scale: 0-10 Pain Score: 2  Pain Type: Chronic pain Pain Location: Hand Pain Orientation: Right, Left Pain Descriptors / Indicators: Aching, Numbness, Tingling Pain Onset: On-going Pain Intervention(s): Medication (See eMAR), Rest Multiple Pain Sites: No  Medications Reviewed Today     Reviewed by Morgan Clayborne CROME, RN (Registered Nurse) on 12/17/24 at 1514  Med List Status: <None>   Medication Order Taking? Sig Documenting Provider Last Dose Status Informant  amLODipine  (NORVASC ) 5 MG tablet [id number] Yes TAKE 1 TABLET(5 MG) BY MOUTH DAILY Ngetich, Dinah C, NP  Active Self, Pharmacy Records  calcium -vitamin D  (OSCAL WITH D) 500-200 MG-UNIT per tablet [id number]  Take 1 tablet by mouth 2 (two) times daily.  Patient not taking: Reported on 12/17/2024   Arnetha Heft, MD  Active Self, Pharmacy Records  carvedilol  (COREG ) 12.5 MG tablet [id number] Yes TAKE 1 TABLET ONCE DAILY FOR BLOOD PRESSURE Ngetich, Dinah C, NP  Active Self, Pharmacy Records  cetirizine (ZYRTEC) 10 MG tablet [id number] Yes Take 10 mg by mouth at bedtime as needed for allergies. [provider]  Active Self, Pharmacy Records  Continuous Glucose Receiver (FREESTYLE LIBRE 3 READER) DEVI [id number]  1 Device by Does not apply route 3 (three) times daily. Ngetich, Dinah C, NP  Active   Continuous Glucose Sensor (FREESTYLE LIBRE 3 SENSOR) [place] [id number]  1 Device by Does not apply route 3 (three) times a week. Place 1 sensor on the skin every 14 days. Use to check glucose continuously Ngetich, Dinah C, NP  Active   cyanocobalamin  (VITAMIN B12) 1000 MCG tablet [id number] Yes Take 1 tablet (1,000 mcg total) by mouth daily.  Patient taking differently: Take 1,000 mcg by mouth daily. Patient is taking twice weekly   Ngetich, Roxan BROCKS, NP  Active Self, Pharmacy Records  ELDERBERRY PO [id number]  Take by mouth 3 (three) times a week.  Patient not taking: Reported on 12/17/2024   [provider]  Active Self, Pharmacy Records  ezetimibe  (ZETIA ) 10 MG tablet [id number] Yes Take 1 tablet (10 mg total) by mouth daily. Ngetich,  Roxan BROCKS, NP  Active Self, Pharmacy Records  ibuprofen  (ADVIL ) 800 MG tablet [id number] Yes Take 1 tablet (800 mg total) by mouth every 8 (eight) hours as needed. Ngetich, Dinah C, NP  Active   ketorolac  (ACULAR ) 0.5 %  ophthalmic solution [id number] Yes Place 1 drop into the right eye 4 (four) times daily.  Patient taking differently: Place 1 drop into the right eye 4 (four) times daily. Patient is using 1 drop in the right eye daily as instructed by eye specialist at [facility]   [provider]  Active Self, Pharmacy Records  losartan  (COZAAR ) 100 MG tablet [id number] Yes Take 1 tablet (100 mg total) by mouth daily. Ngetich, Roxan BROCKS, NP  Active Self, Pharmacy Records  Magnesium  Oxide -Mg Supplement 500 MG CAPS [id number]  Take 2 capsules (1,000 mg total) by mouth at bedtime.  Patient not taking: Reported on 12/17/2024   Ngetich, Roxan BROCKS, NP  Active Self, Pharmacy Records  Multiple Vitamins-Minerals (ZINC  PO) [id number]  Take 1 tablet by mouth once a week.  Patient not taking: Reported on 12/17/2024   [provider]  Active Self, Pharmacy Records           Med Note (MARROW, ERIN T   Sun Sep 02, 2024  9:08 AM) Pt did not specify what day of the week that she takes this medication   pantoprazole  (PROTONIX ) 40 MG tablet [id number] Yes Take 1 tablet (40 mg total) by mouth daily. Ngetich, Roxan BROCKS, NP  Active Self, Pharmacy Records  phenylephrine  (,USE FOR PREPARATION-H,) 0.25 % suppository [id number] Yes Place 1 suppository rectally as needed for hemorrhoids. [provider]  Active Self, Pharmacy Records  prednisoLONE  acetate (PRED FORTE ) 1 % ophthalmic suspension [id number] Yes Place 1 drop into the right eye 4 (four) times daily.   Patient taking differently: Place 1 drop into the right eye 4 (four) times daily. Patient is using 1 drop in her right eye 1-2 times daily   [provider]  Active Self, Pharmacy Records  saxagliptin  HCl (ONGLYZA) 2.5 MG TABS tablet [id number] Yes Take 1 tablet (2.5 mg total) by mouth daily. Ngetich, Roxan BROCKS, NP  Active Self, Pharmacy Records  senna-docusate (SENOKOT-S) 8.6-50 MG tablet [id number]  Take 1 tablet by mouth at bedtime.  Patient not  taking: Reported on 12/17/2024   Ngetich, Roxan BROCKS, NP  Active Self, Pharmacy Records  simethicone  (GAS-X) 80 MG chewable tablet [id number] Yes Chew 1 tablet (80 mg total) by mouth every 6 (six) hours as needed for flatulence. Ngetich, Roxan BROCKS, NP  Active Self, Pharmacy Records  sodium chloride  (OCEAN) 0.65 % SOLN nasal spray [id number] Yes Place 1 spray into both nostrils as needed for congestion. Ngetich, Roxan BROCKS, NP  Active Self, Pharmacy Records  witch hazel-glycerin  (TUCKS) pad [id number] Yes Apply 1 Application topically as needed for itching. Ngetich, Roxan BROCKS, NP  Active Self, Pharmacy Records            Recommendation:   PCP Follow-up 01/16/2025 Status: Sch   Time: 8:30 AM Length: 15  Visit Type: LAB VISIT [8014] Copay: $0.00  Provider: [facility] Department: [facility]    01/18/2025 Status: Sch   Time: 10:00 AM Length: 20  Visit Type: OFFICE VISIT [8002] Copay: $0.00  Provider: Leonarda Roxan BROCKS, NP Department: [facility]    Follow Up Plan:   Telephone follow up appointment date/time:     12/21/2024 Status: Sch   Time: 3:00 PM Length: 60  Visit Type: VBCI TELEPHONE CALL 60 [2503] Copay: $0.00  Provider: Maranda Lister D Department: [facility]   01/21/2025 Status: Sch   Time: 11:00 AM Length: 30  Visit Type: VBCI TELEPHONE CALL 30 [2502] Copay: $0.00  Provider: Morgan Clayborne CROME, RN Department: [facility]   Clayborne Morgan RN BSN CCM [place]  [facility], [facility] Health Nurse Care Coordinator  Direct Dial: [phone] Website: [email or web address]

## 2024-12-17 NOTE — Patient Instructions (Signed)
 Visit Information  Thank you for taking time to visit with me today. Please don't hesitate to contact me if I can be of assistance to you before our next scheduled appointment.  Our next appointment is by telephone on Monday, February 9  at 11:00 AM Please call the care guide team at [phone] if you need to cancel or reschedule your appointment.   Following is a copy of your care plan:   Goals Addressed             This Visit's Progress    VBCI RN Care Plan related to chronic pain with diabetic polyneuropathy to both hands       Problems:  Chronic Disease Management support and education needs related to chronic pain with diabetic polyneuropathy to both hands   Goal: Over the next 90 days the Patient will continue to work with RN Care Manager and/or Social Worker to address care management and care coordination needs related to chronic pain with diabetic polyneuropathy to both hands  as evidenced by adherence to care management team scheduled appointments      Interventions:   Pain Interventions: Pain assessment performed Medications reviewed Reviewed provider established plan for pain management, educated patient about potential adverse effects from taking Ibu Profen long term, including risk for GI bleed and kidney damage  Discussed importance of adherence to all scheduled medical appointments Counseled on the importance of reporting any/all new or changed pain symptoms or management strategies to pain management provider Advised patient to report to care team affect of pain on daily activities Discussed use of relaxation techniques and/or diversional activities to assist with pain reduction (distraction, imagery, relaxation, massage, acupressure, TENS, heat, and cold application Reviewed with patient prescribed pharmacological and nonpharmacological pain relief strategies Assessed social determinant of health barriers Reviewed and discussed with patient her next upcoming  scheduled lab/in person PCP visits Sent an in basket message to PCP provider Roxan Plough NP requesting RA and Vitamin B12 labs be added to next scheduled lab visit per patient request  Discussed plans with patient for ongoing nurse care management follow up and provided patient with direct contact information for nurse case management   Patient Self-Care Activities:  Attend all scheduled provider appointments Call pharmacy for medication refills 3-7 days in advance of running out of medications Call provider office for new concerns or questions  Take medications as prescribed   Work with the social worker to address care coordination needs and will continue to work with the clinical team to address health care and disease management related needs  Recommendation:   PCP Follow-up 01/16/2025 Status: Sch    Time: 8:30 AM Length: 15  Visit Type: LAB VISIT [8014] Copay: $0.00  Provider: [facility] Department[facility]     01/18/2025 Status: Sch    Time: 10:00 AM Length: 20  Visit Type: OFFICE VISIT [8002] Copay: $0.00  Provider: Plough Roxan BROCKS, NP Department: [facility]      Follow Up Plan:   Telephone follow up appointment date/time:     12/21/2024 Status: Sch    Time: 3:00 PM Length: 60  Visit Type: VBCI TELEPHONE CALL 60 [2503] Copay: $0.00  Provider: Maranda Lister D Department: [facility]    01/21/2025 Status: Sch    Time: 11:00 AM Length: 30  Visit Type: VBCI TELEPHONE CALL 30 [2502] Copay: $0.00  Provider: Morgan Clayborne CROME, RN Department: [facility]  Please call 1-800-273-TALK (toll free, 24 hour hotline) if you are experiencing a Mental Health or Behavioral Health Crisis or need someone to talk to.  Patient verbalized understanding of Care plan and visit instructions communicated this visit  Clayborne Ly RN BSN CCM [facility]  [facility], [facility] Nurse Care Coordinator   Direct Dial: [phone] Website: [email or web address]

## 2024-12-17 NOTE — Telephone Encounter (Signed)
-----   Message from Nurse Clayborne CROME, RN sent at 12/17/2024  3:59 PM EST ----- Regarding: Re: patient requesting labs added Hello Tanya Bell,   I connected with Tanya Bell today for complex care management. She is experiencing moderate to severe pain and swelling to both hands, worse on the right. She is taking 800 mg of Ibu Profen as needed but on a regular basis along with 100 mg of Gabapentin  daily. She is also experiencing numbness/tingling to all finger tips and I see she has a dx of diabetic polyneuropathy.   She has never been checked for RA and is requesting auto-immune labs as well as a Vitamin B 12 with her next lab draw scheduled for 01/16/25. She will f/u with you on 01/18/25. If you agree, can you please add these labs to her orders for 2/4? Please let me know if you have any questions!!  Warmly, Clayborne Morgan OBIE BETHANN CCM [place]  [facility], [facility] Nurse Care Coordinator  Direct Dial: [phone] Website: [email or web address]

## 2024-12-17 NOTE — Telephone Encounter (Signed)
 Message received from Nurse Clayborne L.RN states patient complains of pain and swelling of hands.request Auto Immune lab work.Also request vitamin B 12 level to be added to up coming labs on 01/16/2025. Labs order as requested.

## 2024-12-18 ENCOUNTER — Other Ambulatory Visit: Payer: Self-pay | Admitting: Family

## 2024-12-18 MED ORDER — SAXAGLIPTIN HCL 2.5 MG PO TABS: 2.5000 mg | ORAL_TABLET | Freq: Every day | ORAL | 1 refills | Status: AC

## 2024-12-18 NOTE — Telephone Encounter (Signed)
 Copied from CRM #[id number]. Topic: Clinical - Medication Refill >> Dec 18, 2024  8:59 AM Susanna ORN wrote: Medication: saxagliptin  HCl (ONGLYZA) 2.5 MG TABS tablet  Has the patient contacted their pharmacy? No (Agent: If no, request that the patient contact the pharmacy for the refill. If patient does not wish to contact the pharmacy document the reason why and proceed with request.) (Agent: If yes, when and what did the pharmacy advise?)  This is the patient's preferred pharmacy:  [facility] - [place], [place] - [address] [place] [address] Phone: [phone] Fax: [phone]  Is this the correct pharmacy for this prescription? Yes If no, delete pharmacy and type the correct one.   Has the prescription been filled recently? No  Is the patient out of the medication? No but states she only has a few left & doesn't want to run out.   Has the patient been seen for an appointment in the last year OR does the patient have an upcoming appointment? Yes  Can we respond through MyChart? Yes  Agent: Please be advised that Rx refills may take up to 3 business days. We ask that you follow-up with your pharmacy.

## 2024-12-19 DIAGNOSIS — S43005D Unspecified dislocation of left shoulder joint, subsequent encounter: Secondary | ICD-10-CM | POA: Diagnosis not present

## 2024-12-19 DIAGNOSIS — S43004D Unspecified dislocation of right shoulder joint, subsequent encounter: Secondary | ICD-10-CM | POA: Diagnosis not present

## 2024-12-19 DIAGNOSIS — Z7984 Long term (current) use of oral hypoglycemic drugs: Secondary | ICD-10-CM | POA: Diagnosis not present

## 2024-12-19 DIAGNOSIS — H409 Unspecified glaucoma: Secondary | ICD-10-CM | POA: Diagnosis not present

## 2024-12-19 DIAGNOSIS — K219 Gastro-esophageal reflux disease without esophagitis: Secondary | ICD-10-CM | POA: Diagnosis not present

## 2024-12-19 DIAGNOSIS — E785 Hyperlipidemia, unspecified: Secondary | ICD-10-CM | POA: Diagnosis not present

## 2024-12-19 DIAGNOSIS — G8321 Monoplegia of upper limb affecting right dominant side: Secondary | ICD-10-CM | POA: Diagnosis not present

## 2024-12-19 DIAGNOSIS — J45909 Unspecified asthma, uncomplicated: Secondary | ICD-10-CM | POA: Diagnosis not present

## 2024-12-19 DIAGNOSIS — K5901 Slow transit constipation: Secondary | ICD-10-CM | POA: Diagnosis not present

## 2024-12-19 DIAGNOSIS — I10 Essential (primary) hypertension: Secondary | ICD-10-CM | POA: Diagnosis not present

## 2024-12-19 DIAGNOSIS — E1142 Type 2 diabetes mellitus with diabetic polyneuropathy: Secondary | ICD-10-CM | POA: Diagnosis not present

## 2024-12-21 ENCOUNTER — Telehealth: Payer: Self-pay | Admitting: Licensed Clinical Social Worker

## 2025-01-01 ENCOUNTER — Other Ambulatory Visit: Payer: Self-pay

## 2025-01-01 DIAGNOSIS — I10 Essential (primary) hypertension: Secondary | ICD-10-CM

## 2025-01-01 MED ORDER — CARVEDILOL 12.5 MG PO TABS: ORAL_TABLET | ORAL | 1 refills | Status: DC

## 2025-01-15 ENCOUNTER — Telehealth: Payer: Self-pay

## 2025-01-15 NOTE — Telephone Encounter (Signed)
 Copied from CRM #[id number]. Topic: Clinical - Home Health Verbal Orders >> Jan 15, 2025 10:59 AM Marda MATSU wrote: Caller/Agency: [facility]  Callback Number: [phone] Service Requested: Occupational Therapy Frequency:  Any new concerns about the patient? Yes Heart rate 52-58 beats per minute with light activity ,, BP 164/76  Temp 97.3 oxygen 96  Per Melanie patient stated overall she feels fine.

## 2025-01-15 NOTE — Telephone Encounter (Signed)
 Please Advise as there are new concern for patient    Message sent to Ngetich, Dinah C, NP

## 2025-01-15 NOTE — Telephone Encounter (Signed)
 Copied from CRM #[id number]. Topic: Clinical - Request for Lab/Test Order >> Jan 15, 2025 12:28 PM Chiquita SQUIBB wrote: Reason for CRM: Patient and her home health nurse are calling in stating that the patient will not be able to get to her lab work appointment on 2/4 due to the transportation department not being able to help her. The home nurse suggested that she could draw the labs and take it [facility] to be processed but would need the order from the doctor. Please contact the nurse back if Roxan would like to do this at [phone] and the fax number is [phone].

## 2025-01-15 NOTE — Telephone Encounter (Signed)
 Noted.

## 2025-01-15 NOTE — Telephone Encounter (Signed)
 Was Heart rate checked after patient took her Coreg  or before.Recommend office visit to check EKG if HR was low prior to taking Coreg  medication.

## 2025-01-15 NOTE — Telephone Encounter (Signed)
 Patient does have an appointment to be seen in office on Friday

## 2025-01-15 NOTE — Telephone Encounter (Signed)
 Spoke with Home Health Nurse to let know that the lab orders will be faxed to their office.

## 2025-01-15 NOTE — Telephone Encounter (Signed)
 Patient has been notified to let her know that the home health will be coming to draw her labs.   Message sent to Ngetich, Dinah C, NP

## 2025-01-16 ENCOUNTER — Other Ambulatory Visit: Payer: Self-pay

## 2025-01-16 DIAGNOSIS — K219 Gastro-esophageal reflux disease without esophagitis: Secondary | ICD-10-CM

## 2025-01-16 DIAGNOSIS — M7989 Other specified soft tissue disorders: Secondary | ICD-10-CM

## 2025-01-16 DIAGNOSIS — I10 Essential (primary) hypertension: Secondary | ICD-10-CM

## 2025-01-16 DIAGNOSIS — E1142 Type 2 diabetes mellitus with diabetic polyneuropathy: Secondary | ICD-10-CM

## 2025-01-16 DIAGNOSIS — E785 Hyperlipidemia, unspecified: Secondary | ICD-10-CM

## 2025-01-18 ENCOUNTER — Encounter: Payer: Self-pay | Admitting: Family

## 2025-01-18 ENCOUNTER — Ambulatory Visit: Payer: Self-pay | Admitting: Family

## 2025-01-18 VITALS — BP 136/80 | HR 57 | Temp 98.2°F | Ht 61.0 in | Wt 150.2 lb

## 2025-01-18 DIAGNOSIS — K5901 Slow transit constipation: Secondary | ICD-10-CM

## 2025-01-18 DIAGNOSIS — Z23 Encounter for immunization: Secondary | ICD-10-CM

## 2025-01-18 DIAGNOSIS — E1142 Type 2 diabetes mellitus with diabetic polyneuropathy: Secondary | ICD-10-CM

## 2025-01-18 DIAGNOSIS — I10 Essential (primary) hypertension: Secondary | ICD-10-CM

## 2025-01-18 DIAGNOSIS — K219 Gastro-esophageal reflux disease without esophagitis: Secondary | ICD-10-CM

## 2025-01-18 DIAGNOSIS — R001 Bradycardia, unspecified: Secondary | ICD-10-CM

## 2025-01-18 DIAGNOSIS — E785 Hyperlipidemia, unspecified: Secondary | ICD-10-CM

## 2025-01-18 DIAGNOSIS — B351 Tinea unguium: Secondary | ICD-10-CM

## 2025-01-18 LAB — CBC WITH DIFFERENTIAL/PLATELET
Absolute Lymphocytes: 2568 {cells}/uL (ref 850–3900)
Absolute Monocytes: 650 {cells}/uL (ref 200–950)
Basophils Absolute: 59 {cells}/uL (ref 0–200)
Basophils Relative: 0.9 %
Eosinophils Absolute: 182 {cells}/uL (ref 15–500)
Eosinophils Relative: 2.8 %
HCT: 40.7 % (ref 35.9–46.0)
Hemoglobin: 13.1 g/dL (ref 11.7–15.5)
MCH: 25 pg — ABNORMAL LOW (ref 27.0–33.0)
MCHC: 32.2 g/dL (ref 31.6–35.4)
MCV: 77.7 fL — ABNORMAL LOW (ref 81.4–101.7)
MPV: 12.1 fL (ref 7.5–12.5)
Monocytes Relative: 10 %
Neutro Abs: 3042 {cells}/uL (ref 1500–7800)
Neutrophils Relative %: 46.8 %
Platelets: 167 10*3/uL (ref 140–400)
RBC: 5.24 Million/uL — ABNORMAL HIGH (ref 3.80–5.10)
RDW: 17.1 % — ABNORMAL HIGH (ref 11.0–15.0)
Total Lymphocyte: 39.5 %
WBC: 6.5 10*3/uL (ref 3.8–10.8)

## 2025-01-18 LAB — COMPLETE METABOLIC PANEL WITHOUT GFR
AG Ratio: 1.3 (calc) (ref 1.0–2.5)
ALT: 12 U/L (ref 6–29)
AST: 12 U/L (ref 10–35)
Albumin: 4.2 g/dL (ref 3.6–5.1)
Alkaline phosphatase (APISO): 59 U/L (ref 37–153)
BUN: 17 mg/dL (ref 7–25)
CO2: 28 mmol/L (ref 20–32)
Calcium: 9.6 mg/dL (ref 8.6–10.4)
Chloride: 105 mmol/L (ref 98–110)
Creat: 0.64 mg/dL (ref 0.60–0.95)
Globulin: 3.3 g/dL (ref 1.9–3.7)
Glucose, Bld: 103 mg/dL — ABNORMAL HIGH (ref 65–99)
Potassium: 4.4 mmol/L (ref 3.5–5.3)
Sodium: 141 mmol/L (ref 135–146)
Total Bilirubin: 0.6 mg/dL (ref 0.2–1.2)
Total Protein: 7.5 g/dL (ref 6.1–8.1)

## 2025-01-18 LAB — LIPID PANEL
Cholesterol: 174 mg/dL
HDL: 60 mg/dL
LDL Cholesterol (Calc): 100 mg/dL — ABNORMAL HIGH
Non-HDL Cholesterol (Calc): 114 mg/dL
Total CHOL/HDL Ratio: 2.9 (calc)
Triglycerides: 55 mg/dL

## 2025-01-18 MED ORDER — CALCIUM-VITAMIN D-VITAMIN K 650-12.5-40 MG-MCG-MCG PO CHEW: 1.0000 | CHEWABLE_TABLET | Freq: Every day | ORAL | 1 refills | Status: AC

## 2025-01-18 MED ORDER — CARVEDILOL 6.25 MG PO TABS: ORAL_TABLET | ORAL | 3 refills | Status: AC

## 2025-01-18 NOTE — Progress Notes (Signed)
 "  Provider: Lyrik Buresh FNP-C   Tanya Bell, Tanya BROCKS, NP  Patient Care Team: Aaryanna Hyden, Tanya BROCKS, NP as PCP - General (Family Medicine) Dyane Rush, MD (Inactive) as Consulting Physician (Gastroenterology) Octavia Bruckner, MD as Consulting Physician (Ophthalmology) Elner Arley LABOR, MD as Consulting Physician (Ophthalmology) Georg Gaskins, MD as Referring Physician (Psychiatry) Jearline Hurl (Inactive) (Orthotics) Little, Clayborne CROME, RN as [facility] Care Management  Extended Emergency Contact Information Primary Emergency Contact: Best,Robin Mobile Phone: [phone] Relation: Daughter Secondary Emergency Contact: Best,Denette Mobile Phone: [phone] Relation: Daughter Preferred language: English  Code Status:  Full Code  Goals of care: Advanced Directive information    01/18/2025    9:53 AM  Advanced Directives  Does Patient Have a Medical Advance Directive? No  Would patient like information on creating a medical advance directive? No - Patient declined     Chief Complaint  Patient presents with   Medical Management of Chronic Issues    6 Month follow up.    History of Present Illness   Tanya Bell is an 84 year old female who presents for a six-month follow-up after a fall. She is accompanied by her husband, who drove her to the appointment.  She experiences ongoing numbness and tingling in her hands following a fall six months ago, although her mobility has improved. She continues with physical therapy and reports that she can raise her right hand, though not as much as she could before, but notes it is getting better.  She has not yet completed her blood work, which was due earlier in the week, and plans to do it during this visit. There is a concern about her B12 levels, as it is important for nerve health, and she recalls a previous drop in hemoglobin levels.  She is currently taking saxagliptin  for diabetes, prednisone eye drops four times daily, Protonix  40 mg  daily for acid reflux, and various medications as needed, including TAT pads, nasal spray, Gas-X, Senokot, and Preparation H. She has stopped taking gabapentin  due to drowsiness and concerns about its effect on her heart rate.  She is considering her vitamin regimen, questioning the need for multivitamins versus individual supplements like vitamin D , K2, and calcium . She has been taking B12 and B6 and is curious about the benefits of B1 and B2 for diabetes.  She mentions a history of low heart rate, with a recent EKG showing a heart rate of 64, and a previous instance of low heart rate in September 2025. She has experienced some tiredness and weakness and takes carvedilol  for blood pressure.  She reports missing a podiatry appointment due to hospitalization and is experiencing discomfort with her toenails, particularly the pinky toe. She has an upcoming appointment to address these issues.  She experiences morning mucus and swelling in her right nostril, which she has not been treating with Flonase . She also reports occasional acid reflux and uses Zyrtec as needed for allergies.  She manages constipation with Senokot and Miralax  and is trying to increase her water intake, although she finds it challenging. She drinks sugar-free Gatorade for hydration.   Past Medical History:  Diagnosis Date   Acute upper respiratory infections of unspecified site    Acute upper respiratory infections of unspecified site    Acute upper respiratory infections of unspecified site    Allergic rhinitis due to pollen    Candidiasis of mouth    Disorder of bone and cartilage, unspecified    Diverticulitis of colon (without mention of hemorrhage)(562.11)  Essential hypertension, benign    Essential hypertension, benign    Extrinsic asthma, unspecified    Herpes simplex with other ophthalmic complications    Other abnormal glucose    Other and unspecified hyperlipidemia    Other and unspecified hyperlipidemia     Other and unspecified hyperlipidemia    Other cataract    Other cataract    Other dystrophy of vulva    Other malaise and fatigue    Rash and other nonspecific skin eruption    Reflux esophagitis    Thrombocytopenia, unspecified    Type II or unspecified type diabetes mellitus without mention of complication, not stated as uncontrolled    Type II or unspecified type diabetes mellitus without mention of complication, not stated as uncontrolled    Unspecified disorder of skin and subcutaneous tissue    Unspecified essential hypertension    Unspecified essential hypertension    Past Surgical History:  Procedure Laterality Date   ABDOMINAL HYSTERECTOMY     CESAREAN SECTION     1960,1964,1971,1972   CYST EXCISION  1974   Pilonidal Cyst excision   EYE SURGERY Right 04/14/2017   Dr. Elner   KNEE ARTHROSCOPY Right    for meniscal tear   RETINAL TEAR REPAIR CRYOTHERAPY  10/21/2017   Getting injections in right eye    Allergies[1]  Allergies as of 01/18/2025       Reactions   Crestor  [rosuvastatin  Calcium ]    Muscle cramps   Latex Other (See Comments)   GLOVES   Sulfa Antibiotics    Grass Pollen(k-o-r-t-swt Vern) Itching   Itchy watery eyes, sneezing         Medication List        Accurate as of January 18, 2025 12:05 PM. If you have any questions, ask your nurse or doctor.          STOP taking these medications    calcium -vitamin D  500-200 MG-UNIT tablet Commonly known as: OSCAL WITH D Stopped by: Tanya Plough, NP   ELDERBERRY PO Stopped by: Tanya Plough, NP   FreeStyle Libre 3 Reader Devi Stopped by: Tanya Plough, NP   FreeStyle Libre 3 Sensor Misc Stopped by: Tanya Plough, NP   ZINC  PO Stopped by: Tanya Plough, NP       TAKE these medications    amLODipine  5 MG tablet Commonly known as: NORVASC  TAKE 1 TABLET(5 MG) BY MOUTH DAILY   Calcium -Vitamin D -Vitamin K  650-12.5-40 MG-MCG-MCG Chew Chew 1 tablet by mouth daily. Started by: Chane Magner, NP   carvedilol  6.25 MG tablet Commonly known as: COREG  TAKE 1 TABLET ONCE DAILY FOR BLOOD PRESSURE What changed: medication strength Changed by: Bertram Haddix, NP   cetirizine 10 MG tablet Commonly known as: ZYRTEC Take 10 mg by mouth at bedtime as needed for allergies.   cyanocobalamin  1000 MCG tablet Commonly known as: VITAMIN B12 Take 1 tablet (1,000 mcg total) by mouth daily.   ezetimibe  10 MG tablet Commonly known as: ZETIA  Take 1 tablet (10 mg total) by mouth daily.   gabapentin  100 MG capsule Commonly known as: NEURONTIN  Take 100 mg by mouth daily. What changed:  when to take this reasons to take this   ibuprofen  800 MG tablet Commonly known as: ADVIL  Take 1 tablet (800 mg total) by mouth every 8 (eight) hours as needed.   ketorolac  0.5 % ophthalmic solution Commonly known as: ACULAR  Place 1 drop into the right eye 4 (four) times daily.   losartan  100 MG tablet Commonly  known as: Cozaar  Take 1 tablet (100 mg total) by mouth daily.   Magnesium  Oxide -Mg Supplement 500 MG Caps Take 2 capsules (1,000 mg total) by mouth at bedtime.   pantoprazole  40 MG tablet Commonly known as: PROTONIX  Take 1 tablet (40 mg total) by mouth daily.   phenylephrine  0.25 % suppository Commonly known as: (USE for PREPARATION-H) Place 1 suppository rectally as needed for hemorrhoids.   prednisoLONE  acetate 1 % ophthalmic suspension Commonly known as: PRED FORTE  Place 1 drop into the right eye 4 (four) times daily.   saxagliptin  HCl 2.5 MG Tabs tablet Commonly known as: ONGLYZA Take 1 tablet (2.5 mg total) by mouth daily.   senna-docusate 8.6-50 MG tablet Commonly known as: Senokot-S Take 1 tablet by mouth at bedtime. What changed:  when to take this reasons to take this   simethicone  80 MG chewable tablet Commonly known as: Gas-X Chew 1 tablet (80 mg total) by mouth every 6 (six) hours as needed for flatulence.   sodium chloride  0.65 % Soln nasal  spray Commonly known as: OCEAN Place 1 spray into both nostrils as needed for congestion.   witch hazel-glycerin  pad Commonly known as: TUCKS Apply 1 Application topically as needed for itching.        Review of Systems  Constitutional:  Negative for appetite change, chills, fatigue, fever and unexpected weight change.  HENT:  Negative for congestion, dental problem, ear discharge, ear pain, hearing loss, nosebleeds, postnasal drip, rhinorrhea, sinus pressure, sinus pain, sneezing, sore throat, tinnitus and trouble swallowing.   Eyes:  Negative for pain, discharge, redness, itching and visual disturbance.  Respiratory:  Negative for cough, chest tightness, shortness of breath and wheezing.   Cardiovascular:  Negative for chest pain, palpitations and leg swelling.  Gastrointestinal:  Negative for abdominal distention, abdominal pain, blood in stool, constipation, diarrhea, nausea and vomiting.  Endocrine: Negative for cold intolerance, heat intolerance, polydipsia, polyphagia and polyuria.  Genitourinary:  Negative for difficulty urinating, dysuria, flank pain, frequency and urgency.  Musculoskeletal:  Positive for arthralgias. Negative for back pain, gait problem, joint swelling, myalgias, neck pain and neck stiffness.       Right shoulder pain   Skin:  Negative for color change, pallor, rash and wound.  Neurological:  Positive for numbness. Negative for dizziness, syncope, speech difficulty, weakness, light-headedness and headaches.       Numbness on hands   Hematological:  Does not bruise/bleed easily.  Psychiatric/Behavioral:  Negative for agitation, behavioral problems, confusion, hallucinations, self-injury, sleep disturbance and suicidal ideas. The patient is not nervous/anxious.     Immunization History  Administered Date(s) Administered   Fluad Quad(high Dose 65+) 08/29/2019, 09/02/2020, 09/28/2021, 08/23/2022   Fluad Trivalent(High Dose 65+) 10/10/2023   INFLUENZA, HIGH  DOSE SEASONAL PF 08/16/2018, 01/18/2025   Influenza Split 08/25/2010, 08/26/2011, 10/04/2012   Influenza Whole 09/24/2009   Influenza,inj,Quad PF,6+ Mos 09/11/2013, 09/04/2014, 11/12/2015, 10/04/2016, 09/06/2017   Influenza-Unspecified 09/02/2018   Moderna Covid-19 Fall Seasonal Vaccine 72yrs & older 08/19/2023   PFIZER(Purple Top)SARS-COV-2 Vaccination 02/11/2020, 03/03/2020, 09/17/2020, 07/04/2021, 10/09/2021   Pneumococcal Conjugate-13 02/17/2012   Pneumococcal Polysaccharide-23 05/19/2016   Tdap 02/17/2012   Zoster Recombinant(Shingrix) 10/15/2021, 02/02/2022   Pertinent  Health Maintenance Due  Topic Date Due   HEMOGLOBIN A1C  01/10/2025   OPHTHALMOLOGY EXAM  02/15/2025   FOOT EXAM  01/18/2026   Influenza Vaccine  Completed   Bone Density Scan  Completed      03/23/2024    1:26 PM 07/18/2024  9:53 AM 10/12/2024    9:05 AM 12/17/2024    3:37 PM 01/18/2025    9:52 AM  Fall Risk  Falls in the past year? 0 0 1 1 0  Was there an injury with Fall? 0  0  1  1 0  Fall Risk Category Calculator 0 0 2 2 0  Patient at Risk for Falls Due to No Fall Risks No Fall Risks No Fall Risks History of fall(s);Impaired balance/gait;Impaired mobility No Fall Risks  Fall risk Follow up Falls evaluation completed Falls evaluation completed Falls evaluation completed Falls evaluation completed;Education provided;Falls prevention discussed Falls evaluation completed     Data saved with a previous flowsheet row definition   Functional Status Survey:    Vitals:   01/18/25 0958  BP: 136/80  Pulse: (!) 57  Temp: 98.2 F (36.8 C)  SpO2: 97%  Weight: 150 lb 3.2 oz (68.1 kg)  Height: 5' 1 (1.549 m)   Body mass index is 28.38 kg/m. Physical Exam  GENERAL: Alert, cooperative, well developed, no acute distress HEENT: Normocephalic, normal oropharynx, moist mucous membranes, right nasal mucosa swollen CHEST: Clear to auscultation bilaterally, no wheezes, rhonchi, or crackles CARDIOVASCULAR: Normal  heart rate and rhythm, S1 and S2 normal without murmurs ABDOMEN: Soft, non-tender, non-distended, without organomegaly, normal bowel sounds EXTREMITIES: No cyanosis or edema NEUROLOGICAL: Cranial nerves grossly intact, moves all extremities without gross motor or sensory deficit, sensation intact in feet bilaterally  SKIN: No rash,no lesion or erythema   PSYCHIATRY/BEHAVIORAL: Mood stable   Labs reviewed: Recent Labs    09/05/24 0419 09/09/24 0519 10/12/24 0951  NA 137 139 140  K 3.7 4.0 4.4  CL 103 105 106  CO2 25 24 23   GLUCOSE 115* 113* 109*  BUN 30* 18 16  CREATININE 0.78 0.64 0.65  CALCIUM  8.9 9.1 9.9  MG  --  2.0  --   PHOS  --  4.0  --    Recent Labs    07/10/24 0906 09/03/24 1220 09/05/24 0419  AST 13 16 18   ALT 13 15 16   ALKPHOS  --  49 46  BILITOT 0.6 1.1 1.0  PROT 7.9 7.0 6.9  ALBUMIN  --  2.9* 2.7*   Recent Labs    09/01/24 1736 09/03/24 1220 09/03/24 1253 09/05/24 0419 09/09/24 0519 10/12/24 0951  WBC 11.1* 10.4   < > 8.0 7.0 5.8  NEUTROABS 7.2 7.0  --   --   --  2,958  HGB 13.6 12.5   < > 13.3 11.9* 12.4  HCT 39.4 36.1   < > 37.9 34.0* 39.2  MCV 75.3* 74.7*   < > 74.0* 73.3* 79.4*  PLT 177 146*   < > 147* 217 188   < > = values in this interval not displayed.   Lab Results  Component Value Date   TSH 1.04 07/10/2024   Lab Results  Component Value Date   HGBA1C 6.2 (H) 07/10/2024   Lab Results  Component Value Date   CHOL 182 07/10/2024   HDL 60 07/10/2024   LDLCALC 108 (H) 07/10/2024   TRIG 46 07/10/2024   CHOLHDL 3.0 07/10/2024    Significant Diagnostic Results in last 30 days:  No results found.  Assessment/Plan  Bradycardia Heart rate is low at 64 bpm with sinus rhythm and first-degree AV block. Symptoms include occasional tiredness and weakness. Current medication, carvedilol , may contribute to bradycardia. - Reduced carvedilol  to 6.25 mg daily. - Referred to cardiologist for further evaluation. -  Monitor heart rate at  home and report if it exceeds 100 bpm.  Essential hypertension Blood pressure management is ongoing. Current medications include amlodipine  and carvedilol . Adjustments to carvedilol  are being made due to bradycardia. - Continue current antihypertensive regimen with adjusted carvedilol  dosage.  Type 2 diabetes mellitus with diabetic polyneuropathy Diabetes management includes saxagliptin . Gabapentin  was discontinued due to drowsiness and potential bradycardia. B12 levels will be checked to assess for deficiency. - Continue saxagliptin  2.5 mg daily. - Checked B12 levels. - Consider vitamin D  and calcium  supplementation for bone health.  Hyperlipidemia Cholesterol management with Zetia  is ongoing. - Continue Zetia  as prescribed.  Gastroesophageal reflux disease GERD is managed with Protonix . - Continue Protonix  40 mg daily.  Slow transit constipation Constipation is managed with Senokot and Miralax . - Continue Senokot and Miralax  as needed. - Encouraged adequate hydration.  Allergic rhinitis Nasal congestion and swelling noted. Flonase  and Zyrtec are recommended for symptom management. - Use Flonase  nasal spray as needed. - Take Zyrtec as needed for allergy symptoms.  Onychomycosis Toenail issues with pain and thickening. Appointment with podiatrist is scheduled for toenail care. - Attend podiatry appointment for toenail care.  General Health Maintenance Flu vaccination is planned. Discussion on vitamin supplementation for bone and nerve health. - Administered flu shot in the left arm. - Recommended multivitamin, vitamin D3 with K2, and calcium  supplementation.     Family/ staff Communication: Reviewed plan of care with patient verbalized understanding   Las/tests ordered:  - EKG  - Urine MicroAlbumin creatine ratio  Next Appointment : Return in about 6 months (around 07/18/2025) for medical mangement of chronic issues.SABRA   Spent 38 minutes of Face to face and non-face to  face with patient  >50% time spent counseling; reviewing medical record; tests; labs; documentation and developing future plan of care.   Tanya Bell Mishaal Lansdale, NP      [1]  Allergies Allergen Reactions   Crestor  [Rosuvastatin  Calcium ]     Muscle cramps   Latex Other (See Comments)    GLOVES   Sulfa Antibiotics    Grass Pollen(K-O-R-T-Swt Vern) Itching    Itchy watery eyes, sneezing    "

## 2025-01-18 NOTE — Patient Instructions (Signed)
 Patient will receive Tdap vaccine at pharmacy

## 2025-01-21 ENCOUNTER — Telehealth

## 2025-01-23 ENCOUNTER — Ambulatory Visit: Payer: Self-pay | Admitting: Podiatry

## 2025-01-31 ENCOUNTER — Telehealth

## 2025-07-17 ENCOUNTER — Ambulatory Visit: Admitting: Family
